# Patient Record
Sex: Female | Born: 1945
Health system: Southern US, Community
[De-identification: ages and names within clinical notes are randomized; demographics above are authoritative.]

## PROBLEM LIST (undated history)

## (undated) DIAGNOSIS — I82409 Acute embolism and thrombosis of unspecified deep veins of unspecified lower extremity: Secondary | ICD-10-CM

## (undated) DIAGNOSIS — R7302 Impaired glucose tolerance (oral): Secondary | ICD-10-CM

## (undated) DIAGNOSIS — M8580 Other specified disorders of bone density and structure, unspecified site: Secondary | ICD-10-CM

## (undated) DIAGNOSIS — K219 Gastro-esophageal reflux disease without esophagitis: Secondary | ICD-10-CM

## (undated) DIAGNOSIS — M797 Fibromyalgia: Secondary | ICD-10-CM

## (undated) DIAGNOSIS — M899 Disorder of bone, unspecified: Secondary | ICD-10-CM

## (undated) DIAGNOSIS — E785 Hyperlipidemia, unspecified: Secondary | ICD-10-CM

## (undated) DIAGNOSIS — C4492 Squamous cell carcinoma of skin, unspecified: Secondary | ICD-10-CM

## (undated) DIAGNOSIS — J449 Chronic obstructive pulmonary disease, unspecified: Secondary | ICD-10-CM

## (undated) DIAGNOSIS — R5382 Chronic fatigue, unspecified: Secondary | ICD-10-CM

## (undated) DIAGNOSIS — Z8619 Personal history of other infectious and parasitic diseases: Secondary | ICD-10-CM

## (undated) DIAGNOSIS — I10 Essential (primary) hypertension: Secondary | ICD-10-CM

## (undated) DIAGNOSIS — A310 Pulmonary mycobacterial infection: Secondary | ICD-10-CM

## (undated) DIAGNOSIS — C50919 Malignant neoplasm of unspecified site of unspecified female breast: Secondary | ICD-10-CM

## (undated) DIAGNOSIS — M949 Disorder of cartilage, unspecified: Secondary | ICD-10-CM

## (undated) HISTORY — DX: Fibromyalgia: M79.7

## (undated) HISTORY — DX: Essential (primary) hypertension: I10

## (undated) HISTORY — DX: Squamous cell carcinoma of skin, unspecified: C44.92

## (undated) HISTORY — DX: Gastro-esophageal reflux disease without esophagitis: K21.9

## (undated) HISTORY — DX: Pulmonary mycobacterial infection: A31.0

## (undated) HISTORY — DX: Chronic obstructive pulmonary disease, unspecified: J44.9

## (undated) HISTORY — DX: Hyperlipidemia, unspecified: E78.5

## (undated) HISTORY — DX: Impaired glucose tolerance (oral): R73.02

## (undated) HISTORY — DX: Chronic fatigue, unspecified: R53.82

## (undated) HISTORY — DX: Malignant neoplasm of unspecified site of unspecified female breast: C50.919

## (undated) HISTORY — DX: Acute embolism and thrombosis of unspecified deep veins of unspecified lower extremity: I82.409

## (undated) HISTORY — PX: FOOT SURGERY: SHX648

## (undated) HISTORY — DX: Disorder of cartilage, unspecified: M94.9

## (undated) HISTORY — PX: OTHER SURGICAL HISTORY: SHX169

## (undated) HISTORY — DX: Other specified disorders of bone density and structure, unspecified site: M85.80

## (undated) HISTORY — DX: Disorder of bone, unspecified: M89.9

## (undated) HISTORY — DX: Personal history of other infectious and parasitic diseases: Z86.19

---

## 2004-05-05 ENCOUNTER — Encounter: Payer: Self-pay | Admitting: Infectious Diseases

## 2006-06-05 ENCOUNTER — Ambulatory Visit: Payer: Self-pay | Admitting: Internal Medicine

## 2006-06-19 ENCOUNTER — Ambulatory Visit: Payer: Self-pay | Admitting: Pulmonary Disease

## 2006-06-20 ENCOUNTER — Ambulatory Visit: Payer: Self-pay | Admitting: Internal Medicine

## 2006-07-23 ENCOUNTER — Ambulatory Visit: Payer: Self-pay | Admitting: Pulmonary Disease

## 2006-08-29 ENCOUNTER — Ambulatory Visit: Payer: Self-pay | Admitting: Pulmonary Disease

## 2006-09-26 ENCOUNTER — Other Ambulatory Visit: Admission: RE | Admit: 2006-09-26 | Discharge: 2006-09-26 | Payer: Self-pay | Admitting: Obstetrics and Gynecology

## 2006-10-11 ENCOUNTER — Ambulatory Visit: Payer: Self-pay | Admitting: Pulmonary Disease

## 2006-10-11 LAB — CONVERTED CEMR LAB: Pap Smear: NORMAL

## 2006-10-17 ENCOUNTER — Ambulatory Visit: Payer: Self-pay | Admitting: Cardiology

## 2006-10-23 ENCOUNTER — Encounter: Payer: Self-pay | Admitting: Internal Medicine

## 2006-10-23 ENCOUNTER — Encounter: Admission: RE | Admit: 2006-10-23 | Discharge: 2006-10-23 | Payer: Self-pay | Admitting: Obstetrics and Gynecology

## 2006-12-06 ENCOUNTER — Ambulatory Visit: Payer: Self-pay | Admitting: Internal Medicine

## 2006-12-17 ENCOUNTER — Ambulatory Visit: Payer: Self-pay | Admitting: Pulmonary Disease

## 2006-12-24 ENCOUNTER — Ambulatory Visit: Payer: Self-pay | Admitting: Internal Medicine

## 2006-12-25 ENCOUNTER — Ambulatory Visit: Payer: Self-pay | Admitting: Gastroenterology

## 2007-01-01 ENCOUNTER — Encounter (INDEPENDENT_AMBULATORY_CARE_PROVIDER_SITE_OTHER): Payer: Self-pay | Admitting: Specialist

## 2007-01-01 ENCOUNTER — Ambulatory Visit: Payer: Self-pay | Admitting: Internal Medicine

## 2007-01-02 ENCOUNTER — Ambulatory Visit: Payer: Self-pay

## 2007-01-02 ENCOUNTER — Encounter: Payer: Self-pay | Admitting: Cardiovascular Disease

## 2007-01-15 ENCOUNTER — Ambulatory Visit: Payer: Self-pay | Admitting: Internal Medicine

## 2007-01-15 LAB — CONVERTED CEMR LAB
ALT: 32 units/L (ref 0–40)
Albumin: 3.6 g/dL (ref 3.5–5.2)
Alkaline Phosphatase: 32 units/L — ABNORMAL LOW (ref 39–117)
Cholesterol: 148 mg/dL (ref 0–200)
LDL Cholesterol: 80 mg/dL (ref 0–99)
Total CHOL/HDL Ratio: 2.6
Total Protein: 6.7 g/dL (ref 6.0–8.3)
VLDL: 10 mg/dL (ref 0–40)

## 2007-03-18 ENCOUNTER — Ambulatory Visit: Payer: Self-pay | Admitting: Emergency Medicine

## 2007-03-18 ENCOUNTER — Ambulatory Visit: Payer: Self-pay | Admitting: Pulmonary Disease

## 2007-06-05 ENCOUNTER — Ambulatory Visit: Payer: Self-pay | Admitting: Internal Medicine

## 2007-06-05 LAB — CONVERTED CEMR LAB
Rhuematoid fact SerPl-aCnc: <20 intl units/mL — ABNORMAL LOW (ref 0.0–20.0)
Sed Rate: 12 mm/hr (ref 0–25)
ds DNA Ab: <1 (ref <5)

## 2007-09-11 ENCOUNTER — Ambulatory Visit: Payer: Self-pay | Admitting: Pulmonary Disease

## 2007-10-16 ENCOUNTER — Ambulatory Visit: Payer: Self-pay | Admitting: Internal Medicine

## 2007-10-16 DIAGNOSIS — A318 Other mycobacterial infections: Secondary | ICD-10-CM | POA: Insufficient documentation

## 2007-10-16 DIAGNOSIS — IMO0001 Reserved for inherently not codable concepts without codable children: Secondary | ICD-10-CM | POA: Insufficient documentation

## 2007-10-16 DIAGNOSIS — M797 Fibromyalgia: Secondary | ICD-10-CM | POA: Insufficient documentation

## 2007-10-16 DIAGNOSIS — A310 Pulmonary mycobacterial infection: Secondary | ICD-10-CM | POA: Insufficient documentation

## 2007-10-16 DIAGNOSIS — I10 Essential (primary) hypertension: Secondary | ICD-10-CM | POA: Insufficient documentation

## 2007-10-16 DIAGNOSIS — G43009 Migraine without aura, not intractable, without status migrainosus: Secondary | ICD-10-CM | POA: Insufficient documentation

## 2007-10-16 DIAGNOSIS — E785 Hyperlipidemia, unspecified: Secondary | ICD-10-CM

## 2007-10-16 DIAGNOSIS — M810 Age-related osteoporosis without current pathological fracture: Secondary | ICD-10-CM

## 2007-10-16 DIAGNOSIS — K219 Gastro-esophageal reflux disease without esophagitis: Secondary | ICD-10-CM | POA: Insufficient documentation

## 2007-10-16 DIAGNOSIS — M899 Disorder of bone, unspecified: Secondary | ICD-10-CM

## 2007-10-16 DIAGNOSIS — Z87898 Personal history of other specified conditions: Secondary | ICD-10-CM | POA: Insufficient documentation

## 2007-10-16 HISTORY — DX: Disorder of bone, unspecified: M89.9

## 2007-10-17 LAB — CONVERTED CEMR LAB
AST: 29 units/L (ref 0–37)
Bilirubin, Direct: 0.1 mg/dL (ref 0.0–0.3)
Chloride: 103 meq/L (ref 96–112)
Cholesterol: 190 mg/dL (ref 0–200)
Creatinine, Ser: 0.8 mg/dL (ref 0.4–1.2)
Eosinophils Relative: 1.9 % (ref 0.0–5.0)
Glucose, Bld: 104 mg/dL — ABNORMAL HIGH (ref 70–99)
HCT: 41.1 % (ref 36.0–46.0)
HDL: 76.9 mg/dL (ref >39.0)
Hemoglobin: 14.2 g/dL (ref 12.0–15.0)
Ketones, ur: NEGATIVE mg/dL
LDL Cholesterol: 99 mg/dL (ref 0–99)
Leukocytes, UA: NEGATIVE
MCV: 96.2 fL (ref 78.0–100.0)
Monocytes Absolute: 0.8 10*3/uL — ABNORMAL HIGH (ref 0.2–0.7)
Neutrophils Relative %: 70.9 % (ref 43.0–77.0)
Nitrite: NEGATIVE
Potassium: 4.2 meq/L (ref 3.5–5.1)
RBC: 4.27 M/uL (ref 3.87–5.11)
RDW: 12.2 % (ref 11.5–14.6)
Sodium: 142 meq/L (ref 135–145)
TSH: 1.5 microintl units/mL (ref 0.35–5.50)
Total Bilirubin: 0.7 mg/dL (ref 0.3–1.2)
Total Protein: 7.3 g/dL (ref 6.0–8.3)
Urobilinogen, UA: 0.2 (ref 0.0–1.0)
WBC: 6.3 10*3/uL (ref 4.5–10.5)

## 2007-11-04 ENCOUNTER — Ambulatory Visit: Payer: Self-pay | Admitting: Infectious Diseases

## 2007-11-08 ENCOUNTER — Ambulatory Visit (HOSPITAL_COMMUNITY): Admission: RE | Admit: 2007-11-08 | Discharge: 2007-11-08 | Payer: Self-pay | Admitting: Family Medicine

## 2007-11-25 ENCOUNTER — Encounter: Admission: RE | Admit: 2007-11-25 | Discharge: 2007-11-25 | Payer: Self-pay | Admitting: Obstetrics and Gynecology

## 2007-11-27 ENCOUNTER — Ambulatory Visit: Payer: Self-pay | Admitting: Infectious Diseases

## 2007-12-04 ENCOUNTER — Encounter: Payer: Self-pay | Admitting: Infectious Diseases

## 2008-01-27 ENCOUNTER — Ambulatory Visit: Payer: Self-pay | Admitting: Infectious Diseases

## 2008-01-27 LAB — CONVERTED CEMR LAB
ALT: 26 units/L (ref 0–35)
Albumin: 4.7 g/dL (ref 3.5–5.2)
Basophils Absolute: 0 10*3/uL (ref 0.0–0.1)
CO2: 27 meq/L (ref 19–32)
Chloride: 103 meq/L (ref 96–112)
Eosinophils Relative: 1 % (ref 0–5)
Lymphocytes Relative: 23 % (ref 12–46)
Neutro Abs: 4 10*3/uL (ref 1.7–7.7)
Neutrophils Relative %: 65 % (ref 43–77)
Platelets: 211 10*3/uL (ref 150–400)
Potassium: 4 meq/L (ref 3.5–5.3)
RDW: 13.3 % (ref 11.5–15.5)
Sodium: 142 meq/L (ref 135–145)
Total Bilirubin: 0.4 mg/dL (ref 0.3–1.2)
Total Protein: 7.5 g/dL (ref 6.0–8.3)
WBC: 6.1 10*3/uL (ref 4.0–10.5)

## 2008-04-13 ENCOUNTER — Encounter: Payer: Self-pay | Admitting: Internal Medicine

## 2008-04-23 ENCOUNTER — Ambulatory Visit: Payer: Self-pay | Admitting: Internal Medicine

## 2008-04-23 DIAGNOSIS — M25519 Pain in unspecified shoulder: Secondary | ICD-10-CM | POA: Insufficient documentation

## 2008-06-29 ENCOUNTER — Ambulatory Visit: Payer: Self-pay | Admitting: Infectious Diseases

## 2008-06-29 LAB — CONVERTED CEMR LAB
AST: 20 units/L (ref 0–37)
Albumin: 4.6 g/dL (ref 3.5–5.2)
Alkaline Phosphatase: 37 units/L — ABNORMAL LOW (ref 39–117)
Basophils Relative: 0 % (ref 0–1)
Eosinophils Absolute: 0 10*3/uL (ref 0.0–0.7)
Lymphs Abs: 1.2 10*3/uL (ref 0.7–4.0)
MCV: 97.4 fL (ref 78.0–100.0)
Neutrophils Relative %: 72 % (ref 43–77)
Platelets: 223 10*3/uL (ref 150–400)
Potassium: 4.1 meq/L (ref 3.5–5.3)
Sodium: 140 meq/L (ref 135–145)
Total Protein: 7.3 g/dL (ref 6.0–8.3)
WBC: 6.9 10*3/uL (ref 4.0–10.5)

## 2008-07-02 ENCOUNTER — Ambulatory Visit (HOSPITAL_BASED_OUTPATIENT_CLINIC_OR_DEPARTMENT_OTHER): Admission: RE | Admit: 2008-07-02 | Discharge: 2008-07-02 | Payer: Self-pay | Admitting: Infectious Diseases

## 2008-09-24 ENCOUNTER — Ambulatory Visit: Payer: Self-pay | Admitting: Internal Medicine

## 2008-09-24 DIAGNOSIS — N959 Unspecified menopausal and perimenopausal disorder: Secondary | ICD-10-CM | POA: Insufficient documentation

## 2008-09-24 LAB — CONVERTED CEMR LAB
ALT: 27 units/L (ref 0–35)
Albumin: 4.2 g/dL (ref 3.5–5.2)
Alkaline Phosphatase: 38 units/L — ABNORMAL LOW (ref 39–117)
BUN: 13 mg/dL (ref 6–23)
CO2: 32 meq/L (ref 19–32)
Eosinophils Relative: 0.4 % (ref 0.0–5.0)
GFR calc Af Amer: 93 mL/min
Glucose, Bld: 102 mg/dL — ABNORMAL HIGH (ref 70–99)
HCT: 42.5 % (ref 36.0–46.0)
Hemoglobin: 14.2 g/dL (ref 12.0–15.0)
Monocytes Absolute: 0.7 10*3/uL (ref 0.1–1.0)
Monocytes Relative: 10.8 % (ref 3.0–12.0)
Neutro Abs: 4.4 10*3/uL (ref 1.4–7.7)
Nitrite: NEGATIVE
Platelets: 179 10*3/uL (ref 150–400)
Potassium: 4.1 meq/L (ref 3.5–5.1)
Total Protein, Urine: NEGATIVE mg/dL
Total Protein: 7.4 g/dL (ref 6.0–8.3)
WBC: 6.3 10*3/uL (ref 4.5–10.5)
pH: 7.5 (ref 5.0–8.0)

## 2008-10-26 ENCOUNTER — Ambulatory Visit: Payer: Self-pay | Admitting: Infectious Diseases

## 2008-11-26 ENCOUNTER — Ambulatory Visit: Payer: Self-pay | Admitting: Internal Medicine

## 2008-11-26 ENCOUNTER — Encounter: Payer: Self-pay | Admitting: Internal Medicine

## 2008-12-01 ENCOUNTER — Encounter: Admission: RE | Admit: 2008-12-01 | Discharge: 2008-12-01 | Payer: Self-pay | Admitting: Obstetrics and Gynecology

## 2009-01-13 ENCOUNTER — Encounter: Payer: Self-pay | Admitting: Infectious Diseases

## 2009-01-22 ENCOUNTER — Telehealth: Payer: Self-pay | Admitting: Internal Medicine

## 2009-04-16 ENCOUNTER — Telehealth (INDEPENDENT_AMBULATORY_CARE_PROVIDER_SITE_OTHER): Payer: Self-pay | Admitting: *Deleted

## 2009-04-16 ENCOUNTER — Telehealth: Payer: Self-pay | Admitting: Internal Medicine

## 2009-06-02 ENCOUNTER — Ambulatory Visit: Payer: Self-pay | Admitting: Internal Medicine

## 2009-06-02 DIAGNOSIS — K921 Melena: Secondary | ICD-10-CM

## 2009-06-02 DIAGNOSIS — G4700 Insomnia, unspecified: Secondary | ICD-10-CM | POA: Insufficient documentation

## 2009-06-28 ENCOUNTER — Encounter: Payer: Self-pay | Admitting: Internal Medicine

## 2009-08-26 ENCOUNTER — Encounter: Payer: Self-pay | Admitting: Internal Medicine

## 2009-10-13 ENCOUNTER — Ambulatory Visit: Payer: Self-pay | Admitting: Internal Medicine

## 2009-10-13 LAB — CONVERTED CEMR LAB
ALT: 23 units/L (ref 0–35)
Albumin: 4.1 g/dL (ref 3.5–5.2)
BUN: 13 mg/dL (ref 6–23)
Basophils Relative: 0.6 % (ref 0.0–3.0)
Bilirubin Urine: NEGATIVE
Calcium: 9.6 mg/dL (ref 8.4–10.5)
Creatinine, Ser: 0.8 mg/dL (ref 0.4–1.2)
Direct LDL: 106.4 mg/dL
Eosinophils Absolute: 0 10*3/uL (ref 0.0–0.7)
Eosinophils Relative: 0.9 % (ref 0.0–5.0)
GFR calc non Af Amer: 76.74 mL/min (ref >60)
Glucose, Bld: 95 mg/dL (ref 70–99)
HCT: 42.2 % (ref 36.0–46.0)
HDL: 86.3 mg/dL (ref >39.00)
Hemoglobin: 14.7 g/dL (ref 12.0–15.0)
MCHC: 34.9 g/dL (ref 30.0–36.0)
MCV: 100.1 fL — ABNORMAL HIGH (ref 78.0–100.0)
Monocytes Absolute: 0.6 10*3/uL (ref 0.1–1.0)
Neutro Abs: 3.6 10*3/uL (ref 1.4–7.7)
Nitrite: NEGATIVE
RBC: 4.21 M/uL (ref 3.87–5.11)
Sodium: 141 meq/L (ref 135–145)
Total Protein: 7.2 g/dL (ref 6.0–8.3)
Urine Glucose: NEGATIVE mg/dL
Urobilinogen, UA: 0.2 (ref 0.0–1.0)
WBC: 5.4 10*3/uL (ref 4.5–10.5)

## 2009-10-20 ENCOUNTER — Ambulatory Visit: Payer: Self-pay | Admitting: Internal Medicine

## 2009-11-09 LAB — CONVERTED CEMR LAB: Pap Smear: NORMAL

## 2009-12-02 ENCOUNTER — Encounter: Admission: RE | Admit: 2009-12-02 | Discharge: 2009-12-02 | Payer: Self-pay | Admitting: Obstetrics and Gynecology

## 2009-12-09 ENCOUNTER — Telehealth: Payer: Self-pay | Admitting: Internal Medicine

## 2010-10-19 ENCOUNTER — Ambulatory Visit: Payer: Self-pay | Admitting: Infectious Diseases

## 2010-10-19 LAB — CONVERTED CEMR LAB
BUN: 13 mg/dL (ref 6–23)
Chloride: 100 meq/L (ref 96–112)
Potassium: 4.3 meq/L (ref 3.5–5.3)
Sodium: 141 meq/L (ref 135–145)

## 2010-10-20 ENCOUNTER — Ambulatory Visit (HOSPITAL_BASED_OUTPATIENT_CLINIC_OR_DEPARTMENT_OTHER)
Admission: RE | Admit: 2010-10-20 | Discharge: 2010-10-20 | Payer: Self-pay | Source: Home / Self Care | Admitting: Infectious Diseases

## 2010-10-20 ENCOUNTER — Ambulatory Visit: Payer: Self-pay | Admitting: Diagnostic Radiology

## 2010-10-31 ENCOUNTER — Encounter: Payer: Self-pay | Admitting: Internal Medicine

## 2010-10-31 ENCOUNTER — Ambulatory Visit: Payer: Self-pay | Admitting: Internal Medicine

## 2010-10-31 DIAGNOSIS — R5383 Other fatigue: Secondary | ICD-10-CM

## 2010-10-31 DIAGNOSIS — J449 Chronic obstructive pulmonary disease, unspecified: Secondary | ICD-10-CM

## 2010-10-31 DIAGNOSIS — R3129 Other microscopic hematuria: Secondary | ICD-10-CM

## 2010-10-31 DIAGNOSIS — R5381 Other malaise: Secondary | ICD-10-CM

## 2010-11-01 LAB — CONVERTED CEMR LAB
Albumin: 4.3 g/dL (ref 3.5–5.2)
Alkaline Phosphatase: 44 units/L (ref 39–117)
Basophils Absolute: 0 10*3/uL (ref 0.0–0.1)
Basophils Relative: 0.4 % (ref 0.0–3.0)
CO2: 30 meq/L (ref 19–32)
Calcium: 9.4 mg/dL (ref 8.4–10.5)
Chloride: 101 meq/L (ref 96–112)
Cholesterol: 199 mg/dL (ref 0–200)
Eosinophils Absolute: 0 10*3/uL (ref 0.0–0.7)
Glucose, Bld: 103 mg/dL — ABNORMAL HIGH (ref 70–99)
HCT: 42 % (ref 36.0–46.0)
HDL: 91.5 mg/dL (ref >39.00)
Hemoglobin: 14.5 g/dL (ref 12.0–15.0)
Ketones, ur: 40 mg/dL
Leukocytes, UA: NEGATIVE
Lymphs Abs: 1.3 10*3/uL (ref 0.7–4.0)
MCHC: 34.6 g/dL (ref 30.0–36.0)
MCV: 97.4 fL (ref 78.0–100.0)
Monocytes Absolute: 0.7 10*3/uL (ref 0.1–1.0)
Neutro Abs: 5.9 10*3/uL (ref 1.4–7.7)
Nitrite: NEGATIVE
RBC: 4.31 M/uL (ref 3.87–5.11)
RDW: 13.1 % (ref 11.5–14.6)
Sodium: 141 meq/L (ref 135–145)
Specific Gravity, Urine: 1.025 (ref 1.000–1.030)
TSH: 1.69 microintl units/mL (ref 0.35–5.50)
Total CHOL/HDL Ratio: 2
Total Protein, Urine: NEGATIVE mg/dL
Total Protein: 7 g/dL (ref 6.0–8.3)
Triglycerides: 44 mg/dL (ref 0.0–149.0)
pH: 6.5 (ref 5.0–8.0)

## 2010-11-09 ENCOUNTER — Ambulatory Visit: Payer: Self-pay | Admitting: Infectious Diseases

## 2010-11-10 ENCOUNTER — Telehealth: Payer: Self-pay | Admitting: Internal Medicine

## 2010-11-24 ENCOUNTER — Ambulatory Visit: Payer: Self-pay | Admitting: Critical Care Medicine

## 2010-11-24 DIAGNOSIS — J471 Bronchiectasis with (acute) exacerbation: Secondary | ICD-10-CM

## 2010-12-06 ENCOUNTER — Ambulatory Visit (HOSPITAL_BASED_OUTPATIENT_CLINIC_OR_DEPARTMENT_OTHER)
Admission: RE | Admit: 2010-12-06 | Discharge: 2010-12-06 | Payer: Self-pay | Source: Home / Self Care | Attending: Obstetrics and Gynecology | Admitting: Obstetrics and Gynecology

## 2010-12-21 ENCOUNTER — Encounter: Payer: Self-pay | Admitting: Infectious Diseases

## 2010-12-21 ENCOUNTER — Ambulatory Visit
Admission: RE | Admit: 2010-12-21 | Discharge: 2010-12-21 | Payer: Self-pay | Source: Home / Self Care | Attending: Infectious Diseases | Admitting: Infectious Diseases

## 2010-12-21 LAB — CONVERTED CEMR LAB
ALT: 12 units/L (ref 0–35)
AST: 18 units/L (ref 0–37)
Alkaline Phosphatase: 42 units/L (ref 39–117)
BUN: 15 mg/dL (ref 6–23)
Creatinine, Ser: 0.82 mg/dL (ref 0.40–1.20)
HCT: 42.3 % (ref 36.0–46.0)
Hemoglobin: 14.7 g/dL (ref 12.0–15.0)
MCHC: 34.8 g/dL (ref 30.0–36.0)
Potassium: 4.3 meq/L (ref 3.5–5.3)
RDW: 13.4 % (ref 11.5–15.5)

## 2011-01-10 NOTE — Miscellaneous (Signed)
Summary: Orders Update   Clinical Lists Changes  Problems: Added new problem of MICROSCOPIC HEMATURIA (ICD-599.72) Orders: Added new Referral order of Urology Referral (Urology) - Signed

## 2011-01-10 NOTE — Assessment & Plan Note (Signed)
Summary: 2wks f/u [mkj]   CC:  f/u ov .  History of Present Illness: 65 yo F with hx cough. Was thought to have scar tissue on CXR  but then later felt to have bronchiectasis (abnormal CT scans as well).  Had bronchoscopy in feb 2005 showing AFB but cx did not grow. then had endoscopy in May 2005 which grew MAI (S- clarithro, eth, RIF 8.0, synergy positive for E/R) and fortuitum (S- cipro/smikacin/tigecycline). began antibiotics may 2005 and was treated with ETH/Azithro and cipro. she continued on anbiotics until March 2007. Did well until  July 2007 when she had a CT scan which showed some "activation" of MAI.  She was restarted on her MAI therapy in Nov 2008. Last CT scan was 7-09    " 1.  Stable mild lingular bronchiectasis.  Stable tiny associated left lung nodules, which are consistent with a postinflammatory etiology. 2.  No active disease." Last seen in ID November of 2009 and was taken off meds for MAI, was doing well. Seen November 2011 and felt like her "MAC is back". More coughing, no fevers, no chills, SOB has been at baseline. Cough is non-productive but she feels like there is sputum there. wt steady. Has some chest discomfort, tightness with breathing deep. she relates this to her fibromyalgia. lymphadenopathy She had f/u CT 10-20-10: 1.  New tree-in-bud opacities in the right middle lobe since the prior CT from July, 2009, consistent with MAI. 2.  Stable scarring, bronchiectasis, and tree-in-bud opacities in the inferior left upper lobe adjacent to the major fissure.  Stable scar and bronchiectasis medially in the right middle lobe.  No new pulmonary parenchymal abnormalities elsewhere. 3.  Stable hyperinflation consistent with COPD and/or asthma. 4.  No significant lymphadenopathy. Had f/u CBC (nl) and CMP (mild increase in Glc) as well.  Feels about the same today. cough was loose at one point, now tight again.   Preventive Screening-Counseling & Management  Alcohol-Tobacco  Alcohol drinks/day: occasional     Alcohol type: mixed drink     Smoking Status: quit     Year Quit: 20 yrs ago  Current Medications (verified): 1)  Nexium 40 Mg Cpdr (Esomeprazole Magnesium) .... Take 1 Capsule By Mouth Two Times A Day 2)  Simvastatin 40 Mg Tabs (Simvastatin) .... 1/2 By Mouth Once Daily 3)  Temazepam 15 Mg  Caps (Temazepam) .Marland Kitchen.. 1 - 2 By Mouth At Bedtime As Needed 4)  Carisoprodol 350 Mg  Tabs (Carisoprodol) .Marland Kitchen.. 1 By Mouth Two Times A Day As Needed 5)  Adult Aspirin Ec Low Strength 81 Mg  Tbec (Aspirin) .Marland Kitchen.. 1 By Mouth Qd 6)  Calcium 1500 Mg Tabs (Calcium Carbonate) .... Take 1 Tablet By Mouth Once A Day 7)  Omega-3 350 Mg Caps (Omega-3 Fatty Acids) .... 2 Tab By Mouth Once Daily (Pt Not Sure of Dose)  Allergies (verified): 1)  ! Biaxin 2)  ! Adhesive Bandages (Adhesive Bandages)    Updated Prior Medication List: NEXIUM 40 MG CPDR (ESOMEPRAZOLE MAGNESIUM) Take 1 capsule by mouth two times a day SIMVASTATIN 40 MG TABS (SIMVASTATIN) 1/2 by mouth once daily TEMAZEPAM 15 MG  CAPS (TEMAZEPAM) 1 - 2 by mouth at bedtime as needed CARISOPRODOL 350 MG  TABS (CARISOPRODOL) 1 by mouth two times a day as needed ADULT ASPIRIN EC LOW STRENGTH 81 MG  TBEC (ASPIRIN) 1 by mouth qd CALCIUM 1500 MG TABS (CALCIUM CARBONATE) Take 1 tablet by mouth once a day OMEGA-3 350 MG CAPS (OMEGA-3 FATTY  ACIDS) 2 tab by mouth once daily (pt not sure of dose)  Current Allergies (reviewed today): ! BIAXIN ! ADHESIVE BANDAGES (ADHESIVE BANDAGES) Vital Signs:  Patient profile:   65 year old female Height:      65.5 inches Weight:      123 pounds BMI:     20.23 BSA:     1.62 Temp:     99.3 degrees F oral BP sitting:   161 / 83  (left arm)  Vitals Entered By: Tomasita Morrow RN (November 09, 2010 10:02 AM) CC: f/u ov  Is Patient Diabetic? No Pain Assessment Patient in pain? yes     Location: chronic Intensity: 5 Type: aching Onset of pain  fibromyalgia  Nutritional Status Detail none   Have you ever been in a relationship where you felt threatened, hurt or afraid?No  Domestic Violence Intervention none  Does patient need assistance? Functional Status Self care Ambulation Normal   Physical Exam  General:  well-developed, well-nourished, well-hydrated, and underweight appearing.   Eyes:  pupils equal, pupils round, and pupils reactive to light.   Mouth:  pharynx pink and moist and no exudates.   Lungs:  normal respiratory effort and normal breath sounds.   Heart:  normal rate, regular rhythm, and no murmur.   Abdomen:  soft, non-tender, and normal bowel sounds.     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 she was on ETH/azithro. will add rifampin. she will call if she has difficulty taking these medications. will have her back to clinic 6-8 weeks.   Problem # 2:  COPD (ICD-496)  will have her seen by pulmonology. offered clu shot but states that she does not take it.  The following medications were removed from the medication list:    Spiriva Handihaler 18 Mcg Caps (Tiotropium bromide monohydrate) ..... Use asd 1 puff once daily  Orders: Est. Patient Level IV (61607) Pulmonary Referral (Pulmonary)  Medications Added to Medication List This Visit: 1)  Azithromycin 500 Mg Tabs (Azithromycin) .... Three times weekly 2)  Myambutol 400 Mg Tabs (Ethambutol hcl) .... 3 tab by mouth three times weekly 3)  Rifadin 300 Mg Caps (Rifampin) .... 2 tabs by mouth three times weekly Prescriptions: RIFADIN 300 MG CAPS (RIFAMPIN) 2 tabs by mouth three times weekly  #1 month x 3   Entered and Authorized by:   Johny Sax MD   Signed by:   Johny Sax MD on 11/09/2010   Method used:   Electronically to        [facility] (retail)       [address]       [place], [place]  [address]       Ph: [phone]        Fax: [phone]   RxID:   [id number] MYAMBUTOL 400 MG TABS (ETHAMBUTOL HCL) 3 tab by mouth three times weekly  #1 month x 3   Entered and Authorized by:   Johny Sax MD   Signed by:   Johny Sax MD on 11/09/2010   Method used:   Electronically to        [facility] (retail)       [address]       [place], [place]  [address]  Ph: [phone]       Fax: [phone]   RxID:   [id number] AZITHROMYCIN 500 MG TABS (AZITHROMYCIN) three times weekly  #30 x 3   Entered and Authorized by:   Johny Sax MD   Signed by:   Johny Sax MD on 11/09/2010   Method used:   Electronically to        [facility] (retail)       [address]       [place], [place]  [address]       Ph: [phone]       Fax: [phone]   RxID:   [id number]

## 2011-01-10 NOTE — Assessment & Plan Note (Signed)
Summary: YEARLY-STC   Vital Signs:  Patient profile:   65 year old female Height:      65.5 inches Weight:      123 pounds BMI:     20.23 O2 Sat:      98 % on Room air Temp:     98.9 degrees F oral Pulse rate:   78 / minute BP sitting:   132 / 72  (left arm) Cuff size:   regular  Vitals Entered By: Zella Ball Ewing CMA Duncan Dull) (October 31, 2010 10:33 AM)  O2 Flow:  Room air  Preventive Care Screening  Bone Density:    Date:  11/26/2008    Next Due:  12/2010    Results:  abnormal std dev  Pap Smear:    Date:  11/09/2009    Results:  normal   Mammogram:    Date:  11/09/2009    Results:  normal   Colonoscopy:    Date:  08/26/2009    Results:  normal      declines flu shot  CC: Yearly/RE   CC:  Yearly/RE.  History of Present Illness: here for f/u- overall doing well;  Pt denies CP, wheezing, orthopnea, pnd, worsening LE edema, palps, dizziness or syncope, but has had mild increased sob/doe  Pt denies new neuro symptoms such as headache, facial or extremity weakness  Pt denies polydipsia, polyuria  Overall good compliance with meds, trying to follow low chol  diet, wt stable, little excercise however .  Denies worsening depressive symptoms, suicidal ideation, or panic.  Overall good compliance with meds, and good tolerability.  No fever, wt loss, night sweats, loss of appetite or other constitutional symptoms  Pt states good ability with ADL's, low fall risk, home safety reviewed and adequate, no significant change in hearing or vision, trying to follow lower chol diet, and occasionally active only with regular excercise.   Preventive Screening-Counseling & Management      Drug Use:  no.    Problems Prior to Update: 1)  Microscopic Hematuria  (ICD-599.72) 2)  Fatigue  (ICD-780.79) 3)  COPD  (ICD-496) 4)  Insomnia-sleep Disorder-unspec  (ICD-780.52) 5)  Hematochezia  (ICD-578.1) 6)  Menopausal Disorder  (ICD-627.9) 7)  Preventive Health Care  (ICD-V70.0) 8)  Shoulder  Pain, Left  (ICD-719.41) 9)  Osteopenia  (ICD-733.90) 10)  Common Migraine  (ICD-346.10) 11)  Hypertension  (ICD-401.9) 12)  Fibromyalgia  (ICD-729.1) 13)  Gerd  (ICD-530.81) 14)  Preventive Health Care  (ICD-V70.0) 15)  Pulmonary Diseases Due To Other Mycobacteria  (ICD-031.0) 16)  Shingles, Hx of  (ICD-V13.8) 17)  Hyperlipidemia  (ICD-272.4) 18)  Bacteremia, Mycobacterium Avium Complex  (ICD-031.2) 19)  Family History of Alcoholism/addiction  (ICD-V61.41) 20)  Family History Breast Cancer 1st Degree Relative <50  (ICD-V16.3)  Medications Prior to Update: 1)  Nexium 40 Mg Cpdr (Esomeprazole Magnesium) .... Take 1 Capsule By Mouth Two Times A Day 2)  Simvastatin 40 Mg Tabs (Simvastatin) .... 1/2 By Mouth Once Daily 3)  Temazepam 15 Mg  Caps (Temazepam) .Marland Kitchen.. 1 - 2 By Mouth At Bedtime As Needed 4)  Carisoprodol 350 Mg  Tabs (Carisoprodol) .Marland Kitchen.. 1 By Mouth Two Times A Day As Needed 5)  Adult Aspirin Ec Low Strength 81 Mg  Tbec (Aspirin) .Marland Kitchen.. 1 By Mouth Qd 6)  Calcium 1500 Mg Tabs (Calcium Carbonate) .... Take 1 Tablet By Mouth Once A Day 7)  Omega-3 350 Mg Caps (Omega-3 Fatty Acids) .... 2 Tab By Mouth Once Daily (Pt Not  Sure of Dose)  Current Medications (verified): 1)  Nexium 40 Mg Cpdr (Esomeprazole Magnesium) .... Take 1 Capsule By Mouth Two Times A Day 2)  Simvastatin 40 Mg Tabs (Simvastatin) .... 1/2 By Mouth Once Daily 3)  Temazepam 15 Mg  Caps (Temazepam) .Marland Kitchen.. 1 - 2 By Mouth At Bedtime As Needed 4)  Carisoprodol 350 Mg  Tabs (Carisoprodol) .Marland Kitchen.. 1 By Mouth Two Times A Day As Needed 5)  Adult Aspirin Ec Low Strength 81 Mg  Tbec (Aspirin) .Marland Kitchen.. 1 By Mouth Qd 6)  Calcium 1500 Mg Tabs (Calcium Carbonate) .... Take 1 Tablet By Mouth Once A Day 7)  Omega-3 350 Mg Caps (Omega-3 Fatty Acids) .... 2 Tab By Mouth Once Daily (Pt Not Sure of Dose) 8)  Spiriva Handihaler 18 Mcg Caps (Tiotropium Bromide Monohydrate) .... Use Asd 1 Puff Once Daily  Allergies (verified): 1)  ! Biaxin 2)  !  Adhesive Bandages (Adhesive Bandages)  Past History:  Past Surgical History: Last updated: 10/16/2007 shoulder impingement foot surgury  Family History: Last updated: 10/20/2009 Family History Breast cancer 1st degree relative <50 Family History Lung cancer - father Family History High cholesterol Family History Hypertension Grandparents with heart dz Family History of Alcoholism/Addiction Father with colon polyps  Social History: Last updated: 10/31/2010 Former Smoker- quit 20+ yrs ago.  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   Head:  normocephalic and atraumatic.   Eyes:  vision grossly intact, pupils equal, and pupils round.   Ears:  R ear normal and L ear normal.   Nose:  no external deformity and no nasal discharge.   Mouth:  no gingival abnormalities and pharynx pink and moist.   Neck:  supple and no masses.   Lungs:  normal respiratory effort and normal breath sounds.   Heart:  normal rate and regular rhythm.   Abdomen:  soft, non-tender, and normal bowel sounds.   Msk:  no joint tenderness and no joint swelling.  , does have some chronic lower lumbar paravertebral tender Extremities:  no edema, no erythema    Neurologic:  cranial nerves II-XII intact and strength normal in all extremities.   Skin:  color normal and no rashes.   Psych:  moderately anxious.     Impression & Recommendations:  Problem # 1:  FATIGUE (ICD-780.79) exam benign, to check labs below; follow with expectant management   Orders: TLB-BMP (Basic Metabolic Panel-BMET) (80048-METABOL) TLB-CBC Platelet - w/Differential (85025-CBCD) TLB-Hepatic/Liver Function Pnl (80076-HEPATIC) TLB-TSH (Thyroid Stimulating Hormone) (84443-TSH)  Problem # 2:  COPD (ICD-496)  Her updated medication list for this problem includes:    Spiriva Handihaler 18 Mcg Caps (Tiotropium bromide monohydrate) ..... Use asd 1 puff once daily to add the spiriva for trial - gave sample and rx  Problem # 3:  HYPERTENSION (ICD-401.9)  Orders: TLB-Udip ONLY (81003-UDIP)  BP today: 132/72 Prior BP: 158/79 (10/19/2010)  Labs Reviewed: K+: 4.3 (10/19/2010) Creat: : 0.75 (10/19/2010)   Chol: 206 (10/13/2009)   HDL: 86.30 (10/13/2009)   LDL: DEL (09/24/2008)   TG: 55.0 (10/13/2009) stable overall by hx and exam, ok to continue meds/tx as is   Problem # 4:  HYPERLIPIDEMIA (ICD-272.4)  Her updated medication list for this problem includes:    Simvastatin 40 Mg Tabs (Simvastatin) .Marland Kitchen... 1/2 by mouth once daily  Orders: TLB-Lipid Panel (80061-LIPID)  Labs Reviewed: SGOT: 27 (10/13/2009)   SGPT: 23 (10/13/2009)   HDL:86.30 (10/13/2009), 88.7 (09/24/2008)  LDL:DEL (09/24/2008), 99 (16/09/9603)  Chol:206 (10/13/2009), 208 (09/24/2008)  Trig:55.0 (10/13/2009), 56 (09/24/2008) stable overall by hx and exam, ok to continue meds/tx as is   Complete Medication List: 1)  Nexium 40 Mg Cpdr (Esomeprazole magnesium) .... Take 1 capsule by mouth two times a day 2)  Simvastatin 40 Mg Tabs (Simvastatin) .... 1/2 by mouth once daily 3)  Temazepam 15 Mg Caps (Temazepam) .Marland Kitchen.. 1 - 2 by mouth at bedtime as needed 4)  Carisoprodol 350 Mg Tabs (Carisoprodol) .Marland Kitchen.. 1 by  mouth two times a day as needed 5)  Adult Aspirin Ec Low Strength 81 Mg Tbec (Aspirin) .Marland Kitchen.. 1 by mouth qd 6)  Calcium 1500 Mg Tabs (Calcium carbonate) .... Take 1 tablet by mouth once a day 7)  Omega-3 350 Mg Caps (Omega-3 fatty acids) .... 2 tab by mouth once daily (pt not sure of dose) 8)  Spiriva Handihaler 18 Mcg Caps (Tiotropium bromide monohydrate) .... Use asd 1 puff once daily  Other Orders: EKG w/ Interpretation (93000) T-Bone Densitometry (54098)  Patient Instructions: 1)  please schedule the bone density for Feb 2012 before leaving today 2)  Your EKG was good today 3)  Please go to the Lab in the basement for your blood and/or urine tests today 4)  Please call the number on the [facility] Card for results of your testing  5)  You are given the medication refills today 6)  Please take all new medications as prescribed  7)  Continue all previous medications as before this visit  8)  Please schedule a follow-up appointment in 1 year, or sooner if needed Prescriptions: SPIRIVA HANDIHALER 18 MCG CAPS (TIOTROPIUM BROMIDE MONOHYDRATE) use asd 1 puff once daily  #90 x 3   Entered and Authorized by:   Corwin Levins MD   Signed by:   Corwin Levins MD on 10/31/2010   Method used:   Print then Give to Patient   RxID:   [id number] TEMAZEPAM 15 MG  CAPS (TEMAZEPAM) 1 - 2 by mouth at bedtime as needed  #60 x 5   Entered and Authorized by:   Corwin Levins MD   Signed by:   Corwin Levins MD on 10/31/2010   Method used:   Print then Give to Patient   RxID:   [id number] CARISOPRODOL 350 MG  TABS (CARISOPRODOL) 1 by mouth two times a day as needed  #60 x 5   Entered and Authorized by:   Corwin Levins MD   Signed by:   Corwin Levins MD on 10/31/2010   Method used:   Print then Give to Patient   RxID:   [id number] SIMVASTATIN 40 MG TABS (SIMVASTATIN) 1/2 by mouth once daily  #90 x 3   Entered and Authorized by:   Corwin Levins MD  Signed by:   Corwin Levins MD on 10/31/2010   Method  used:   Print then Give to Patient   RxID:   [id number] NEXIUM 40 MG CPDR (ESOMEPRAZOLE MAGNESIUM) Take 1 capsule by mouth two times a day  #180 x 3   Entered and Authorized by:   Corwin Levins MD   Signed by:   Corwin Levins MD on 10/31/2010   Method used:   Print then Give to Patient   RxID:   [id number]    Orders Added: 1)  EKG w/ Interpretation [93000] 2)  T-Bone Densitometry [77080] 3)  TLB-BMP (Basic Metabolic Panel-BMET) [80048-METABOL] 4)  TLB-CBC Platelet - w/Differential [85025-CBCD] 5)  TLB-Hepatic/Liver Function Pnl [80076-HEPATIC] 6)  TLB-TSH (Thyroid Stimulating Hormone) [84443-TSH] 7)  TLB-Lipid Panel [80061-LIPID] 8)  TLB-Udip ONLY [81003-UDIP] 9)  Est. Patient Level IV [84696]

## 2011-01-10 NOTE — Assessment & Plan Note (Signed)
Summary: CHECK UP [MKJ]   CC:  pt. c/o flareup of MAC.  History of Present Illness: 65 yo F with hx cough. Was thought to have scar tissue on CXR  but then later felt to have bronchiectasis (abnormal CT scans as well).  Had bronchoscopy in feb 2005 showing AFB but cx did not grow. then had endoscopy in May 2005 which grew MAI (S- clarithro, eth, RIF 8.0, synergy positive for E/R) and fortuitum (S- cipro/smikacin/tigecycline). began antibiotics may 2005 and was treated with ETH/Azithro and cipro. she continued on anbiotics until March 2007. Did well until  July 2007 when she had a CT scan which showed some "activation" of MAI.  She was restarted on her MAI therapy in Nov 2008. Last CT scan was 7-09    " 1.  Stable mild lingular bronchiectasis.  Stable tiny associated left lung nodules, which are consistent with a postinflammatory etiology. 2.  No active disease." Last seen in ID November of 2009 and was taken off meds for MAI, was doing well. Today feels like her "MAC is back". More coughing, no fevers, no chills, SOB has been at baseline. Cough is non-productive but she feels like there is sputum there. wt steady. Has some chest discomfort, tightness with breathing deep. she relates this to her fibromyalgia. lymphadenopathy    Preventive Screening-Counseling & Management  Alcohol-Tobacco     Alcohol drinks/day: occasional     Alcohol type: mixed drink     Smoking Status: quit     Year Quit: 20 yrs ago  Caffeine-Diet-Exercise     Caffeine use/day: coffee 2 per day     Does Patient Exercise: yes     Type of exercise: treadmill, stretching     Exercise (avg: min/session): <30     Times/week: 4  Safety-Violence-Falls     Seat Belt Use: yes   Updated Prior Medication List: NEXIUM 40 MG CPDR (ESOMEPRAZOLE MAGNESIUM) Take 1 capsule by mouth two times a day SIMVASTATIN 40 MG TABS (SIMVASTATIN) 1/2 by mouth once daily TEMAZEPAM 15 MG  CAPS (TEMAZEPAM) 1 - 2 by mouth at bedtime as  needed CARISOPRODOL 350 MG  TABS (CARISOPRODOL) 1 by mouth two times a day as needed ADULT ASPIRIN EC LOW STRENGTH 81 MG  TBEC (ASPIRIN) 1 by mouth qd CALCIUM 1500 MG TABS (CALCIUM CARBONATE) Take 1 tablet by mouth once a day OMEGA-3 350 MG CAPS (OMEGA-3 FATTY ACIDS) 2 tba by mouth once daily (pt not sure of dose)  Current Allergies (reviewed today): ! BIAXIN ! ADHESIVE BANDAGES (ADHESIVE BANDAGES) Vital Signs:  Patient profile:   65 year old female Height:      66 inches (167.64 cm) Weight:      121.8 pounds (55.36 kg) BMI:     19.73 Temp:     98.6 degrees F (37.00 degrees C) oral Pulse rate:   85 / minute BP sitting:   158 / 79  (left arm)  Vitals Entered By: Wendall Mola CMA Duncan Dull) (October 19, 2010 10:22 AM) CC: pt. c/o flareup of MAC Is Patient Diabetic? No Pain Assessment Patient in pain? no      Nutritional Status BMI of 19 -24 = normal Nutritional Status Detail appetite "normal"  Have you ever been in a relationship where you felt threatened, hurt or afraid?No   Does patient need assistance? Functional Status Self care Ambulation Normal Comments no missed doses of meds per pt.   Physical Exam  General:  well-developed, well-nourished, well-hydrated, and underweight appearing.  Eyes:  pupils equal, pupils round, and pupils reactive to light.   Mouth:  pharynx pink and moist and no exudates.   Neck:  no masses.   Lungs:  normal respiratory effort.  good air movement, mild tubular sounds. Heart:  normal rate, regular rhythm, and no murmur.   Abdomen:  soft and non-tender.   Extremities:  no edema   Impression & Recommendations:  Problem # 1:  PULMONARY DISEASES DUE TO OTHER MYCOBACTERIA (ICD-031.0)  she appears more chronically ill than acutely ill today. we discussed restarting her therapy but will recheck her CT of the chest first. return to clinic 2 weeks post CT scan.   Orders: Est. Patient Level III (56213) CT with Contrast (CT w/  contrast) T-Basic Metabolic Panel (08657-84696)  Medications Added to Medication List This Visit: 1)  Calcium 1500 Mg Tabs (Calcium carbonate) .... Take 1 tablet by mouth once a day 2)  Omega-3 350 Mg Caps (Omega-3 fatty acids) .... 2 tab by mouth once daily (pt not sure of dose)   Not Administered:    Influenza Vaccine not given due to: declined

## 2011-01-10 NOTE — Progress Notes (Signed)
Summary: Prescription   Phone Note From Pharmacy   [facility]: [facility]  Summary of Call: Pharmacy sent back prescription for Temazepam and Carisoprodol, both printed and given to patient at [facility] 10/31/2010. Both prescriptions were not signed. Also, just spoke to the patient on the phone and SImvastatin also was not signed. Her pharmacy is [facility]. Initial call taken by: Robin Ewing CMA Duncan Dull),  November 10, 2010 9:24 AM  Follow-up for Phone Call        sorry, this was an error - will re-do rx  done hardcopy to [facility] side B - dahlia  Follow-up by: Corwin Levins MD,  November 10, 2010 11:55 AM  Additional Follow-up for Phone Call Additional follow up Details #1::        Rxs faxed to pharmacy Additional Follow-up by: Margaret Pyle, CMA,  November 10, 2010 12:52 PM    Prescriptions: SIMVASTATIN 40 MG TABS (SIMVASTATIN) 1/2 by mouth once daily  #45 x 3   Entered and Authorized by:   Corwin Levins MD   Signed by:   Corwin Levins MD on 11/10/2010   Method used:   Print then Give to Patient   RxID:   [id number] CARISOPRODOL 350 MG  TABS (CARISOPRODOL) 1 by mouth two times a day as needed  #60 x 5   Entered and Authorized by:   Corwin Levins MD   Signed by:   Corwin Levins MD on 11/10/2010   Method used:   Print then Give to Patient   RxID:   [id number] TEMAZEPAM 15 MG  CAPS (TEMAZEPAM) 1 - 2 by mouth at bedtime as needed  #60 x 5   Entered and Authorized by:   Corwin Levins MD   Signed by:   Corwin Levins MD on 11/10/2010   Method used:   Print then Give to Patient   RxID:   [id number]

## 2011-01-12 NOTE — Assessment & Plan Note (Addendum)
Summary: Pulmonary Consultation   Copy to:  Dr. Johny Sax Primary Provider/Referring Provider:  Dr. Oliver Barre  CC:  Pulmonary Consult - MAC and COPD.Marland Kitchen  History of Present Illness: Pulmonary Consultation  Hx per ID as follows on 11/11: 65 yo F with hx cough. Was thought to have scar tissue on CXR  but then later felt to have bronchiectasis (abnormal CT scans as well).  Had bronchoscopy in feb 2005 showing AFB but cx did not grow. then had endoscopy in May 2005 which grew MAI (S- clarithro, eth, RIF 8.0, synergy positive for E/R) and fortuitum (S- cipro/smikacin/tigecycline). began antibiotics may 2005 and was treated with ETH/Azithro and cipro. she continued on anbiotics until March 2007. Did well until  July 2007 when she had a CT scan which showed some "activation" of MAI.  She was restarted on her MAI therapy in Nov 2008. Last CT scan was 7-09    " 1.  Stable mild lingular bronchiectasis.  Stable tiny associated left lung nodules, which are consistent with a postinflammatory etiology. 2.  No active disease." Last seen in ID November of 2009 and was taken off meds for MAI, was doing well. Today feels like her "MAC is back". More coughing, no fevers, no chills, SOB has been at baseline. Cough is non-productive but she feels like there is sputum there. wt steady. Has some chest discomfort, tightness with breathing deep. she relates this to her fibromyalgia. lymphadenopathy  November 24, 2010 9:55 AM This pt went without pulm ov from 11/09 until 11/11.  This pts MAI hx is as above.   Pt has been off mac rx 11/09 .after 1.5 yrs seemed to get worse again with more cough and more symptoms but due to insurance issues did not see ID again until 11/11.  No pulm ovs since 11/08.,   11/11 CT chest more inflammation RML.  copd changes. ? never dx this before.    ID did restart Rx   THis pt is now on RIF/ETH/Zmax  thrice weekly.    This pt has  never been on RIF before.  This pt has been on  Cipro/ETH/Azithro before.   This pt noted cipro caused tendinitis and no prior issues with RIF.  THe  ETH no issues.     Current symptoms:  diff time coughing. mucus is hard to raise, now is tight in the chest  ? to break up secretions.  had pfts before in 08 notes dyspnea with exertion. has chills but no fever.  no cp, just tightness.  has fibromyalgia no mucus to get up.  stays hoarse  hx of Vocal cord dysfunction.  does have heartburn and acid issues.  is on the nexium, this helps but will breakthrough.   Preventive Screening-Counseling & Management  Alcohol-Tobacco     Smoking Status: quit > 6 months     Packs/Day: 0.75     Year Started: 1969     Year Quit: 1989     Pack years: 10  Current Medications (verified): 1)  Nexium 40 Mg Cpdr (Esomeprazole Magnesium) .... Take 1 Capsule By Mouth Once A Day 2)  Simvastatin 40 Mg Tabs (Simvastatin) .... 1/2 By Mouth Once Daily 3)  Temazepam 15 Mg  Caps (Temazepam) .Marland Kitchen.. 1 - 2 By Mouth At Bedtime As Needed 4)  Carisoprodol 350 Mg  Tabs (Carisoprodol) .Marland Kitchen.. 1 By Mouth Two Times A Day As Needed 5)  Adult Aspirin Ec Low Strength 81 Mg  Tbec (Aspirin) .Marland Kitchen.. 1 By Mouth  Qd 6)  Calcium 1500 Mg Tabs (Calcium Carbonate) .... Take 1 Tablet By Mouth Once A Day 7)  Omega-3 350 Mg Caps (Omega-3 Fatty Acids) .... 2 Tab By Mouth Once Daily (Pt Not Sure of Dose) 8)  Azithromycin 500 Mg Tabs (Azithromycin) .... Three Times Weekly 9)  Myambutol 400 Mg Tabs (Ethambutol Hcl) .... 3 Tab By Mouth Three Times Weekly 10)  Rifadin 300 Mg Caps (Rifampin) .... 2 Tabs By Mouth Three Times Weekly  Allergies (verified): 1)  ! Biaxin 2)  ! Adhesive Bandages (Adhesive Bandages) 3)  ! * Prednisone/steroids 4)  ! Cipro  Past History:  Past medical, surgical, family and social histories (including risk factors) reviewed, and no changes noted (except as noted below).  Past Medical History: Reviewed history from 10/31/2010 and no changes required. pulmonary  MAC/bronchiectasis COPD Hyperlipidemia hx of shingles GERD Fibromyalgia HTN MIGRAINE Osteopenia h/o mult skin ca h/o LLE DVT - remote  Past Surgical History: Reviewed history from 10/16/2007 and no changes required. shoulder impingement foot surgury  Family History: Reviewed history from 10/20/2009 and no changes required. Family History Breast cancer 1st degree relative <50 Family History Lung cancer - father Family History High cholesterol Family History Hypertension Grandparents with heart dz Family History of Alcoholism/Addiction Father with colon polyps  Social History: Reviewed history from 10/31/2010 and no changes required. Former Smoker- quit in 1989. Started at age 27.  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  The patient denies shortness of breath at rest, productive cough, coughing up blood, indigestion, loss of appetite, weight change, abdominal pain, difficulty swallowing, tooth/dental problems, nasal congestion/difficulty breathing through nose, sneezing, itching, ear ache, anxiety, depression, hand/feet swelling, joint stiffness or pain, rash, change in color of mucus, and fever.    Vital Signs:  Patient profile:   65 year old female Height:      65.5 inches Weight:      122.31 pounds BMI:     20.12 O2 Sat:      99 % on Room air Temp:     98.2 degrees F oral Pulse rate:   82 / minute BP sitting:   140 / 90  (left arm) Cuff size:   regular  Vitals Entered By: Gweneth Dimitri RN (November 24, 2010 9:43 AM)  O2 Flow:  Room air CC: Pulmonary Consult - MAC, COPD. Comments Medications reviewed with  patient Daytime contact number verified with patient. Gweneth Dimitri RN  November 24, 2010 9:44 AM    Physical Exam  Additional Exam:  Gen: Pleasant, well-nourished, in no distress,  normal affect ENT: No lesions,  mouth clear,  oropharynx clear, no postnasal drip Neck: No JVD, no TMG, no carotid bruits Lungs: No use of accessory muscles, no dullness to percussion, distant bs. no rhonchi Cardiovascular: RRR, heart sounds normal, no murmur or gallops, no peripheral edema Abdomen: soft and NT, no HSM,  BS normal Musculoskeletal: No deformities, no cyanosis or clubbing Neuro: alert, non focal Skin: Warm, no lesions or rashes    CT of Chest  Procedure date:  10/20/2010  Findings:      Findings: Since the prior examinations, new nodular  and tree-in-bud opacities in the right middle lobe.  Stable bronchiectasis and tree- in-bud opacities in the posterior inferior left upper lobe, adjacent to the fissure.  Stable scar and bronchiectasis medially in the right middle lobe.  Stable hyperinflation and biapical pleuroparenchymal scarring.  No confluent airspace consolidation. No pleural effusions.   Normal sized mediastinal lymph nodes, unchanged; no significant lymphadenopathy.  Normal heart size.  No visible coronary artery calcification.  Mild atherosclerosis involving the thoracic and upper abdominal aorta.  Visualized thyroid gland unremarkable.   Stable approximate 1.8 cm simple cyst in the anterior segment right lobe of liver.  Visualized upper abdomen otherwise unremarkable. Bone window images again demonstrate mild osteopenia and mild thoracic spondylosis.   IMPRESSION:   1.  New tree-in-bud opacities in the right middle lobe since the prior CT from July, 2009, consistent with MAI. 2.  Stable scarring, bronchiectasis, and tree-in-bud opacities in the inferior left upper lobe adjacent to the major fissure.  Stable scar and bronchiectasis medially in the right middle lobe.   No new pulmonary parenchymal abnormalities elsewhere. 3.  Stable hyperinflation consistent with COPD and/or asthma. 4.  No significant lymphadenopathy.    Pulmonary Function Test Date: 11/24/2010 Gender: Female  Pre-Spirometry FVC    Value: 2.99 L/min   Pred: 3.06 L/min     % Pred: 97 % FEV1    Value: 2.26 L     Pred: 2.23 L     % Pred: 101 % FEV1/FVC  Value: 76 %     Pred: 72 %    FEF 25-75  Value: 1.87 L/min   Pred: 2.50 L/min     % Pred: 74 %  Comments: mild peripheral airflow obstruction  Impression & Recommendations:  Problem # 1:  BRONCHIECTASIS WITH ACUTE EXACERBATION (ICD-494.1) Assessment Deteriorated Ongoing bronchiectasis with MAC and mucus plugging . Assoc obstructive lung disease on pfts and gerd ppt factors plan Start Spiriva daily Use Nexium 1/2 hour before meals daily and then eat Reflux diet Stay on ID medications Return 2 months [place]  Medications Added to Medication List This Visit: 1)  Nexium 40 Mg Cpdr (Esomeprazole magnesium) .... Take 1 capsule by mouth once a day 2)  Nexium 40 Mg Cpdr (Esomeprazole magnesium) .... Take 1 capsule by mouth once a day use 1/2 hour before meals and then eat 3)  Adult Aspirin Ec Low Strength 81 Mg Tbec (Aspirin) .Marland Kitchen.. 1 by mouth daily 4)  Spiriva Handihaler 18 Mcg Caps (Tiotropium bromide monohydrate) .... Two puffs in handihaler daily  Complete Medication List: 1)  Nexium 40 Mg Cpdr (Esomeprazole magnesium) .... Take 1 capsule by mouth once a day use 1/2 hour before meals and then eat 2)  Simvastatin 40 Mg Tabs (Simvastatin) .... 1/2 by mouth once daily 3)  Temazepam 15 Mg Caps (Temazepam) .Marland Kitchen.. 1 - 2 by mouth at bedtime as needed 4)  Carisoprodol 350 Mg Tabs (Carisoprodol) .Marland Kitchen.. 1 by mouth two times a day as needed 5)  Adult Aspirin Ec Low Strength 81 Mg Tbec (Aspirin) .Marland Kitchen.. 1 by mouth daily 6)  Calcium 1500 Mg Tabs (Calcium carbonate) .... Take 1 tablet by mouth once a day 7)  Omega-3 350 Mg Caps (Omega-3 fatty acids)  .... 2 tab by mouth once daily (pt not sure of dose) 8)  Azithromycin 500 Mg Tabs (Azithromycin) .... Three times weekly 9)  Myambutol 400 Mg Tabs (Ethambutol hcl) .... 3 tab by mouth three times weekly 10)  Rifadin 300 Mg Caps (Rifampin) .... 2  tabs by mouth three times weekly 11)  Spiriva Handihaler 18 Mcg Caps (Tiotropium bromide monohydrate) .... Two puffs in handihaler daily  Other Orders: New Patient Level V (29562) Spirometry w/Graph (94010)  Patient Instructions: 1)  Start Spiriva daily 2)  Use Nexium 1/2 hour before meals daily and then eat 3)  Reflux diet 4)  Stay on ID medications 5)  Return 2 months [place] Prescriptions: SPIRIVA HANDIHALER 18 MCG  CAPS (TIOTROPIUM BROMIDE MONOHYDRATE) Two puffs in handihaler daily  #30 x 6   Entered and Authorized by:   Storm Frisk MD   Signed by:   Storm Frisk MD on 11/24/2010   Method used:   Electronically to        [facility]       [address]       [place], [place]  [address]       Ph: [phone]       Fax: [phone]   RxID:   [id number]

## 2011-01-12 NOTE — Assessment & Plan Note (Signed)
Summary: 6wk f/u [mkj]   Referring Provider:  Dr. Johny Sax Primary Provider:  Dr. Oliver Barre  CC:  follow-up visit, nausea, chills, and achy since starting the anitbiotics.  History of Present Illness: 65 yo F with hx cough. Had bronchoscopy in feb 2005 showing AFB but cx did not grow. then had endoscopy in May 2005 which grew MAI (S- clarithro, eth, RIF 8.0, synergy positive for E/R) and fortuitum (S- cipro/smikacin/tigecycline).She began antibiotics May 2005 and was treated with ETH/Azithro and cipro until March 2007. Did well until  July 2007 when she had a CT scan which showed some "activation" of MAI.  She was restarted on her MAI therapy in Nov 2008. CT scan was 7-09    " 1.  Stable mild lingular bronchiectasis.  Stable tiny associated left lung nodules, which are consistent with a postinflammatory etiology. 2.  No active disease." Seen in ID November of 2009, was doing well and was taken off meds for MAI.  Seen November 2011 and felt like her "MAC is back". More coughing, no fevers, no chills, SOB has been at baseline. She had f/u CT 10-20-10: 1.  New tree-in-bud opacities in the right middle lobe since the prior CT from July, 2009, consistent with MAI. 2.  Stable scarring, bronchiectasis, and tree-in-bud opacities in the inferior left upper lobe adjacent to the major fissure.  Stable scar and bronchiectasis medially in the right middle lobe.  No new pulmonary parenchymal abnormalities elsewhere. 3.  Stable hyperinflation consistent with COPD and/or asthma. 4.  No significant lymphadenopathy.  She was restarted on MAI rx- Azithro/ETH/RIf. Since she has been on them she has had chills, body aches (which she attributes to RIf) and then also got nausea and fatigue. Was started on spiriva and PPI by Dr Delford Field and she feels that the tightness in her chest is better, cough no different.   Preventive Screening-Counseling & Management  Alcohol-Tobacco     Alcohol drinks/day: occasional  Alcohol type: mixed drink     Smoking Status: quit > 6 months     Packs/Day: 0.75     Year Started: 1969     Year Quit: 1989     Pack years: 10  Caffeine-Diet-Exercise     Caffeine use/day: coffee 2 per day     Does Patient Exercise: yes     Type of exercise: treadmill, stretching     Exercise (avg: min/session): <30     Times/week: 4  Safety-Violence-Falls     Seat Belt Use: yes   Updated Prior Medication List: NEXIUM 40 MG CPDR (ESOMEPRAZOLE MAGNESIUM) Take 1 capsule by mouth once a day Use 1/2 hour before meals and then eat SIMVASTATIN 40 MG TABS (SIMVASTATIN) 1/2 by mouth once daily TEMAZEPAM 15 MG  CAPS (TEMAZEPAM) 1 - 2 by mouth at bedtime as needed CARISOPRODOL 350 MG  TABS (CARISOPRODOL) 1 by mouth two times a day as needed ADULT ASPIRIN EC LOW STRENGTH 81 MG  TBEC (ASPIRIN) 1 by mouth daily CALCIUM 1500 MG TABS (CALCIUM CARBONATE) Take 1 tablet by mouth once a day OMEGA-3 350 MG CAPS (OMEGA-3 FATTY ACIDS) 2 tab by mouth once daily (pt not sure of dose) AZITHROMYCIN 500 MG TABS (AZITHROMYCIN) three times weekly MYAMBUTOL 400 MG TABS (ETHAMBUTOL HCL) 3 tab by mouth three times weekly RIFADIN 300 MG CAPS (RIFAMPIN) 2 tabs by mouth three times weekly SPIRIVA HANDIHALER 18 MCG  CAPS (TIOTROPIUM BROMIDE MONOHYDRATE) Two puffs in handihaler daily  Current Allergies (reviewed today): ! BIAXIN !  ADHESIVE BANDAGES (ADHESIVE BANDAGES) ! * PREDNISONE/STEROIDS ! CIPRO Past History:  Past medical, surgical, family and social histories (including risk factors) reviewed, and no changes noted (except as noted below).  Past Medical History: Reviewed history from 10/31/2010 and no changes required. pulmonary MAC/bronchiectasis COPD Hyperlipidemia hx of shingles GERD Fibromyalgia HTN MIGRAINE Osteopenia h/o mult skin ca h/o LLE DVT - remote  Past Surgical History: Reviewed history from 10/16/2007 and no changes required. shoulder impingement foot surgury  Family  History: Reviewed history from 11/24/2010 and no changes required. Family History Breast cancer 1st degree relative <50 Family History Lung cancer - father Family History High cholesterol Family History Hypertension Grandparents with heart dz Family History of Alcoholism/Addiction Father with colon polyps  Social History: Reviewed history from 11/24/2010 and no changes required. Former Smoker- quit in 1989. Started at age 1.  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 cough is non-prod, no fevers. wt up 1#, apetite is good.   Vital Signs:  Patient profile:   65 year old female Menstrual status:  postmenopausal Height:      65.5 inches (166.37 cm) Weight:      123.0 pounds (55.91 kg) BMI:     20.23 Temp:     98.0 degrees F (36.67 degrees C) oral Pulse rate:   76 / minute BP sitting:   145 / 78  (left arm) Cuff size:   regular  Vitals Entered By: Jennet Maduro RN (December 21, 2010 9:37 AM) CC: follow-up visit, nausea, chills, achy since starting the anitbiotics Is Patient Diabetic? No Pain Assessment Patient in pain? yes     Location: fibromalgia Intensity: 5 Type: anch Onset of pain  Constant Nutritional Status BMI of 19 -24 = normal Nutritional Status Detail appetite "unchanged"  Have you ever been in a relationship where you felt threatened, hurt or afraid?not changed   Does patient need assistance? Functional Status Self care Ambulation Normal     Menstrual Status postmenopausal Last PAP Result normal   Physical Exam  General:  well-developed, well-nourished, well-hydrated, and underweight appearing.   Eyes:  pupils equal, pupils round, and pupils reactive to light.  no icterus.  Mouth:  pharynx pink and moist and no exudates.     Neck:  no masses.   Lungs:  normal respiratory effort and normal breath sounds.   Heart:  normal rate, regular rhythm, and no murmur.   Abdomen:  soft, non-tender, and normal bowel sounds.     Impression & Recommendations:  Problem # 1:  PULMONARY DISEASES DUE TO OTHER MYCOBACTERIA (ICD-031.0)  Her lungs sound good and her cough/sob appear to be doing well. Will recheck her LFTs today, and monthly. She has an ophtho eval scheduled for next month. Will plan to repeat her CT after 6 months of tx, if not improving could consider repeat BAL. We discussed possible eval at [facility]. She wants to defer this at this point.   Orders: T-CBC No Diff ([id number]) T-Comprehensive Metabolic Panel ([id number]) Est. Patient Level III (29562)

## 2011-01-13 ENCOUNTER — Encounter: Payer: Self-pay | Admitting: Infectious Diseases

## 2011-01-19 ENCOUNTER — Encounter: Payer: Self-pay | Admitting: Critical Care Medicine

## 2011-01-19 ENCOUNTER — Ambulatory Visit (INDEPENDENT_AMBULATORY_CARE_PROVIDER_SITE_OTHER): Payer: Medicare Other | Admitting: Critical Care Medicine

## 2011-01-19 DIAGNOSIS — J479 Bronchiectasis, uncomplicated: Secondary | ICD-10-CM

## 2011-01-19 DIAGNOSIS — A310 Pulmonary mycobacterial infection: Secondary | ICD-10-CM

## 2011-01-26 ENCOUNTER — Encounter: Payer: Self-pay | Admitting: Internal Medicine

## 2011-01-26 ENCOUNTER — Other Ambulatory Visit: Payer: Self-pay | Admitting: Critical Care Medicine

## 2011-01-26 ENCOUNTER — Ambulatory Visit (HOSPITAL_COMMUNITY): Admission: RE | Admit: 2011-01-26 | Payer: Medicare Other | Source: Ambulatory Visit | Admitting: Critical Care Medicine

## 2011-01-26 ENCOUNTER — Ambulatory Visit (HOSPITAL_COMMUNITY)
Admission: RE | Admit: 2011-01-26 | Discharge: 2011-01-26 | Disposition: A | Discharge status: Home / Self Care | Payer: Medicare Other | Source: Ambulatory Visit | Attending: Critical Care Medicine | Admitting: Critical Care Medicine

## 2011-01-26 DIAGNOSIS — J479 Bronchiectasis, uncomplicated: Secondary | ICD-10-CM

## 2011-01-26 DIAGNOSIS — M899 Disorder of bone, unspecified: Secondary | ICD-10-CM | POA: Insufficient documentation

## 2011-01-26 DIAGNOSIS — IMO0001 Reserved for inherently not codable concepts without codable children: Secondary | ICD-10-CM | POA: Insufficient documentation

## 2011-01-26 DIAGNOSIS — I10 Essential (primary) hypertension: Secondary | ICD-10-CM | POA: Insufficient documentation

## 2011-01-26 DIAGNOSIS — J471 Bronchiectasis with (acute) exacerbation: Secondary | ICD-10-CM | POA: Insufficient documentation

## 2011-01-26 DIAGNOSIS — K219 Gastro-esophageal reflux disease without esophagitis: Secondary | ICD-10-CM | POA: Insufficient documentation

## 2011-01-26 DIAGNOSIS — E785 Hyperlipidemia, unspecified: Secondary | ICD-10-CM | POA: Insufficient documentation

## 2011-01-26 DIAGNOSIS — Z85828 Personal history of other malignant neoplasm of skin: Secondary | ICD-10-CM | POA: Insufficient documentation

## 2011-01-26 DIAGNOSIS — M949 Disorder of cartilage, unspecified: Secondary | ICD-10-CM | POA: Insufficient documentation

## 2011-01-27 LAB — PNEUMOCYSTIS JIROVECI SMEAR BY DFA: Pneumocystis jiroveci Ag: NEGATIVE

## 2011-01-28 LAB — CULTURE, RESPIRATORY W GRAM STAIN

## 2011-01-29 NOTE — Op Note (Signed)
  NAMETEMIKA, Haney                 ACCOUNT NO.:  [id number]  MEDICAL RECORD NO.:  [id number]           PATIENT TYPE:  O  LOCATION:  RESP                         FACILITY:  [facility]  PHYSICIAN:  Charlcie Cradle. Delford Field, MD, FCCPDATE OF BIRTH:  10/27/1946  DATE OF PROCEDURE:  01/26/2011 DATE OF DISCHARGE:                              OPERATIVE REPORT   PROCEDURE:  Bronchoscopy.  INDICATION:  Right middle lobe bronchiectasis, history of Mycobacterium avium-intracellulare.  PREOPERATIVE MEDICATION:  Fentanyl 50 mcg, Versed 5 mg IV push.  ANESTHESIA:  1% Xylocaine local.  PROCEDURE IN DETAIL:  The Pentax video bronchoscope was introduced into the right naris.  The upper airways were visualized, unremarkable.  The entire tracheobronchial tree was visualized and revealed mild purulence in the right middle lobe with bronchiectatic changes, no endobronchial lesions were specifically identified.  Attention was then paid to the right middle lobe.  Bronchoalveolar lavage was obtained, approximately 150 cc volume infused, approximately 75 cc volume returned. Complications were none.  IMPRESSION:  Right middle lobe bronchiectasis, evaluate for recurrent mycobacterium infection versus other pathogens.  RECOMMENDATIONS:  Follow up pathology and microbiology.     Charlcie Cradle Delford Field, MD, [facility]     PEW/MEDQ  D:  01/26/2011  T:  01/26/2011  Job:  [id number]  cc:   Lacretia Leigh. Ninetta Lights, M.D. Fax: [phone]  Corwin Levins, MD [address] [place] [address]  Electronically Signed by Shan Levans MD FCCP on 01/29/2011 05:26:50 PM

## 2011-01-30 ENCOUNTER — Other Ambulatory Visit: Payer: Self-pay | Admitting: Internal Medicine

## 2011-01-30 ENCOUNTER — Ambulatory Visit (INDEPENDENT_AMBULATORY_CARE_PROVIDER_SITE_OTHER)
Admission: RE | Admit: 2011-01-30 | Discharge: 2011-01-30 | Disposition: A | Discharge status: Home / Self Care | Payer: Medicare Other | Source: Ambulatory Visit | Attending: Internal Medicine | Admitting: Internal Medicine

## 2011-01-30 ENCOUNTER — Other Ambulatory Visit: Payer: Self-pay

## 2011-01-30 ENCOUNTER — Encounter: Payer: Self-pay | Admitting: Internal Medicine

## 2011-01-30 DIAGNOSIS — M899 Disorder of bone, unspecified: Secondary | ICD-10-CM

## 2011-01-30 DIAGNOSIS — M949 Disorder of cartilage, unspecified: Secondary | ICD-10-CM

## 2011-02-01 NOTE — Miscellaneous (Signed)
Summary: Orders Update   Clinical Lists Changes  Orders: Added new Test order of T-Lumbar Vertebral Assessment [phone]) - Signed Added new Test order of T-Bone Densitometry [phone]) - Signed  Appended Document: Orders Update done hardcopy to LIM side B - dahlia

## 2011-02-01 NOTE — Assessment & Plan Note (Addendum)
Summary: Pulmonary OV   Copy to:  Dr. Johny Sax Primary Provider/Referring Provider:  Dr. Oliver Barre  CC:  2 month follow up.  Pt states breathing has improved.  Nonprod cough.  Denies wheezing and chest tightness.Marland Kitchen  History of Present Illness: Pulmonary Consultation  Hx per ID as follows on 11/11: 65 yo F with hx cough. Was thought to have scar tissue on CXR  but then later felt to have bronchiectasis (abnormal CT scans as well).  Had bronchoscopy in feb 2005 showing AFB but cx did not grow. then had endoscopy in May 2005 which grew MAI (S- clarithro, eth, RIF 8.0, synergy positive for E/R) and fortuitum (S- cipro/smikacin/tigecycline). began antibiotics may 2005 and was treated with ETH/Azithro and cipro. she continued on anbiotics until March 2007. Did well until  July 2007 when she had a CT scan which showed some "activation" of MAI.  She was restarted on her MAI therapy in Nov 2008. Last CT scan was 7-09    " 1.  Stable mild lingular bronchiectasis.  Stable tiny associated left lung nodules, which are consistent with a postinflammatory etiology. 2.  No active disease." Last seen in ID November of 2009 and was taken off meds for MAI, was doing well. Today feels like her "MAC is back". More coughing, no fevers, no chills, SOB has been at baseline. Cough is non-productive but she feels like there is sputum there. wt steady. Has some chest discomfort, tightness with breathing deep. she relates this to her fibromyalgia. lymphadenopathy  November 24, 2010 9:55 AM This pt went without pulm ov from 11/09 until 11/11.  This pts MAI hx is as above.   Pt has been off mac rx 11/09 .after 1.5 yrs seemed to get worse again with more cough and more symptoms but due to insurance issues did not see ID again until 11/11.  No pulm ovs since 11/08.,   11/11 CT chest more inflammation RML.  copd changes. ? never dx this before.    ID did restart Rx   THis pt is now on RIF/ETH/Zmax  thrice weekly.    This  pt has  never been on RIF before.  This pt has been on Cipro/ETH/Azithro before.   This pt noted cipro caused tendinitis and no prior issues with RIF.  THe  ETH no issues.     Current symptoms:  diff time coughing. mucus is hard to raise, now is tight in the chest  ? to break up secretions.  had pfts before in 08 notes dyspnea with exertion. has chills but no fever.  no cp, just tightness.  has fibromyalgia no mucus to get up.  stays hoarse  hx of Vocal cord dysfunction.  does have heartburn and acid issues.  is on the nexium, this helps but will breakthrough.    January 19, 2011 9:28 AM No real heartburn, did switch to nexium to prior to meals and now no sore throat.  No real mucus.  Cough dry and hacky . On rx since 12/11.  Chest is not a tight with spiriva.   Notes some chills.  ABX cause fatigue.    Current Medications (verified): 1)  Nexium 40 Mg Cpdr (Esomeprazole Magnesium) .... Take 1 Capsule By Mouth Once A Day Use 1/2 Hour Before Meals and Then Eat 2)  Simvastatin 40 Mg Tabs (Simvastatin) .... 1/2 By Mouth Once Daily 3)  Temazepam 15 Mg  Caps (Temazepam) .Marland Kitchen.. 1 - 2 By Mouth At Bedtime 4)  Carisoprodol 350 Mg  Tabs (Carisoprodol) .... Take 1 Tablet By Mouth Once A Day and Take 1 Tablet By Mouth Two Times A Day As Needed 5)  Adult Aspirin Ec Low Strength 81 Mg  Tbec (Aspirin) .Marland Kitchen.. 1 By Mouth Daily 6)  Calcium 1500 Mg Tabs (Calcium Carbonate) .... Take 1 Tablet By Mouth Once A Day 7)  Omega-3 350 Mg Caps (Omega-3 Fatty Acids) .... 2 Tab By Mouth Once Daily (Pt Not Sure of Dose) 8)  Azithromycin 500 Mg Tabs (Azithromycin) .... Three Times Weekly 9)  Myambutol 400 Mg Tabs (Ethambutol Hcl) .... 3 Tab By Mouth Three Times Weekly 10)  Rifadin 300 Mg Caps (Rifampin) .... 2 Tabs By Mouth Three Times Weekly 11)  Spiriva Handihaler 18 Mcg  Caps (Tiotropium Bromide Monohydrate) .... Two Puffs in Handihaler Daily  Allergies (verified): 1)  ! Biaxin 2)  ! Adhesive Bandages (Adhesive  Bandages) 3)  ! * Prednisone/steroids 4)  ! Cipro  Past History:  Past medical, surgical, family and social histories (including risk factors) reviewed, and no changes noted (except as noted below).  Past Medical History: Reviewed history from 10/31/2010 and no changes required. pulmonary MAC/bronchiectasis COPD Hyperlipidemia hx of shingles GERD Fibromyalgia HTN MIGRAINE Osteopenia h/o mult skin ca h/o LLE DVT - remote  Past Surgical History: Reviewed history from 10/16/2007 and no changes required. shoulder impingement foot surgury  Family History: Reviewed history from 11/24/2010 and no changes required. Family History Breast cancer 1st degree relative <50 Family History Lung cancer - father Family History High cholesterol Family History Hypertension Grandparents with heart dz Family History of Alcoholism/Addiction Father with colon polyps  Social History: Reviewed history from 11/24/2010 and no changes required. Former Smoker- quit in 1989. Started at age 77.  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  The patient denies shortness of breath at rest, productive cough, coughing up blood, chest pain, irregular heartbeats, acid heartburn, indigestion, loss of appetite, weight change, abdominal pain, difficulty swallowing, sore throat, tooth/dental problems, headaches, nasal congestion/difficulty breathing through nose, sneezing, itching, ear ache, anxiety, depression, hand/feet swelling, joint stiffness or pain, rash, change in color of mucus, and fever.    Vital Signs:  Patient profile:   65 year old female Menstrual status:  postmenopausal Height:      65.5 inches Weight:      123 pounds BMI:     20.23 O2 Sat:      100 % on Room air Temp:     98.5 degrees F  oral Pulse rate:   74 / minute BP sitting:   150 / 80  (left arm) Cuff size:   regular  Vitals Entered By: Gweneth Dimitri RN (January 19, 2011 9:24 AM)  O2 Flow:  Room air CC: 2 month follow up.  Pt states breathing has improved.  Nonprod cough.  Denies wheezing and chest tightness. Comments Medications reviewed with patient Daytime contact number verified with patient. Gweneth Dimitri RN  January 19, 2011 9:24 AM    Physical Exam  Additional Exam:  Gen: Pleasant, well-nourished, in no distress,  normal affect ENT: No lesions,  mouth clear,  oropharynx clear, no postnasal drip Neck: No JVD, no TMG, no carotid bruits Lungs: No use of accessory muscles, no dullness to percussion, distant  bs. no rhonchi Cardiovascular: RRR, heart sounds normal, no murmur or gallops, no peripheral edema Abdomen: soft and NT, no HSM,  BS normal Musculoskeletal: No deformities, no cyanosis or clubbing Neuro: alert, non focal Skin: Warm, no lesions or rashes    Impression & Recommendations:  Problem # 1:  BRONCHIECTASIS WITH ACUTE EXACERBATION (ICD-494.1) Assessment Unchanged  Ongoing bronchiectasis with MAC and mucus plugging . Assoc obstructive lung disease on pfts and gerd ppt factors plan FOB to assess current infectious disease status No change in inhaled medications.   Maintain treatment program as currently prescribed.  Medications Added to Medication List This Visit: 1)  Temazepam 15 Mg Caps (Temazepam) .Marland Kitchen.. 1 - 2 by mouth at bedtime 2)  Carisoprodol 350 Mg Tabs (Carisoprodol) .... Take 1 tablet by mouth once a day and take 1 tablet by mouth two times a day as needed  Complete Medication List: 1)  Nexium 40 Mg Cpdr (Esomeprazole magnesium) .... Take 1 capsule by mouth once a day use 1/2 hour before meals and then eat 2)  Simvastatin 40 Mg Tabs (Simvastatin) .... 1/2 by mouth once daily 3)  Temazepam 15 Mg Caps (Temazepam) .Marland Kitchen.. 1 - 2 by mouth at bedtime 4)  Carisoprodol 350 Mg Tabs  (Carisoprodol) .... Take 1 tablet by mouth once a day and take 1 tablet by mouth two times a day as needed 5)  Adult Aspirin Ec Low Strength 81 Mg Tbec (Aspirin) .Marland Kitchen.. 1 by mouth daily 6)  Calcium 1500 Mg Tabs (Calcium carbonate) .... Take 1 tablet by mouth once a day 7)  Omega-3 350 Mg Caps (Omega-3 fatty acids) .... 2 tab by mouth once daily (pt not sure of dose) 8)  Azithromycin 500 Mg Tabs (Azithromycin) .... Three times weekly 9)  Myambutol 400 Mg Tabs (Ethambutol hcl) .... 3 tab by mouth three times weekly 10)  Rifadin 300 Mg Caps (Rifampin) .... 2 tabs by mouth three times weekly 11)  Spiriva Handihaler 18 Mcg Caps (Tiotropium bromide monohydrate) .... Two puffs in handihaler daily  Other Orders: Est. Patient Level III (16109)  Patient Instructions: 1)  No change in medications 2)  A bronchoscopy will be performed at 730am 01/26/11 at [facility]. Arrive at 630am.  Nothing by mouth after Midnight 3)  I will call with results, will be sent to [facility]. 4)  Return in     2     months

## 2011-02-02 ENCOUNTER — Telehealth: Payer: Self-pay | Admitting: Critical Care Medicine

## 2011-02-07 ENCOUNTER — Institutional Professional Consult (permissible substitution): Payer: Self-pay | Admitting: Pulmonary Disease

## 2011-02-07 NOTE — Progress Notes (Signed)
Summary: BAL results   Phone Note Outgoing Call   Reason for Call: Discuss lab or test results Summary of Call: I left msg on pts answer machine to tell her all c/s and afb cults/smears neg to date.  I will chk again in a few weeks on this result  Initial call taken by: Storm Frisk MD,  February 02, 2011 1:42 PM

## 2011-02-07 NOTE — Consult Note (Signed)
Summary: Diane Haney   [facility]   Imported By: Florinda Marker 02/03/2011 16:43:52  _____________________________________________________________________  External Attachment:    Type:   Image     Comment:   External Document

## 2011-02-14 ENCOUNTER — Encounter: Payer: Self-pay | Admitting: Licensed Clinical Social Worker

## 2011-02-20 ENCOUNTER — Ambulatory Visit: Payer: Self-pay | Admitting: Infectious Diseases

## 2011-02-21 LAB — FUNGUS CULTURE W SMEAR

## 2011-03-10 LAB — AFB CULTURE WITH SMEAR (NOT AT ARMC): Acid Fast Smear: NONE SEEN

## 2011-03-15 ENCOUNTER — Encounter: Payer: Self-pay | Admitting: Infectious Diseases

## 2011-03-15 ENCOUNTER — Ambulatory Visit (INDEPENDENT_AMBULATORY_CARE_PROVIDER_SITE_OTHER): Payer: Medicare Other | Admitting: Infectious Diseases

## 2011-03-15 VITALS — BP 156/72 | HR 74 | Temp 98.2°F | Ht 65.5 in | Wt 122.1 lb

## 2011-03-15 DIAGNOSIS — A310 Pulmonary mycobacterial infection: Secondary | ICD-10-CM

## 2011-03-15 NOTE — Progress Notes (Signed)
  Subjective:    Patient ID: Diane Haney, female    DOB: May 31, 1946, 65 y.o.   MRN: [id number]  HPI 65 yo F with hx endoscopy in May 2005 which grew MAI (S- clarithro, eth, RIF 8.0, synergy positive for E/R) and fortuitum (S- cipro/smikacin/tigecycline).She began antibiotics May 2005 and was treated with ETH/Azithro and cipro until March 2007. Did well until July 2007 when she had a CT scan which showed some "activation" of MAI.  She was restarted on her MAI therapy in Nov 2008. CT scan was 7-09 " 1.  Stable mild lingular bronchiectasis.  Stable tiny associated left lung nodules, which are consistent with a postinflammatory etiology. 2.  No active disease." Seen in ID November of 2009, was doing well and was taken off meds for MAI.  Seen November 2011 and felt like her "MAC is back". More coughing, no fevers, no chills, SOB has been at baseline. She had f/u CT 10-20-10: 1.  New tree-in-bud opacities in the right middle lobe since the prior CT from July, 2009, consistent with MAI. 2.  Stable scarring, bronchiectasis, and tree-in-bud opacities in the inferior left upper lobe adjacent to the major fissure.  Stable scar and bronchiectasis medially in the right middle lobe.  No new pulmonary parenchymal abnormalities elsewhere. 3.  Stable hyperinflation consistent with COPD and/or asthma. 4.  No significant lymphadenopathy.  She was restarted (11-09-10) Azithro/ETH/RIf. Was started on spiriva and PPI by Dr Delford Field and she feels that the tightness in her chest is better, cough no different. She had repeat BAL 01-26-11 Cx negative final.  Today feeling all right- no worse than usual. Medicines going well.   Review of Systems     Objective:   Physical Exam  Constitutional: She appears well-developed and well-nourished.          Assessment & Plan:

## 2011-03-15 NOTE — Assessment & Plan Note (Signed)
We spoke at length about her therapy. She would like to stop her medications now. She know that this is not optimal (would ike to treat 1 year) but has been treated 2 x previously and she is convinced that this will come back again, regardless of how long she is treated. I acceded to her wishes, will see her back as needed.

## 2011-03-22 ENCOUNTER — Encounter: Payer: Self-pay | Admitting: Critical Care Medicine

## 2011-03-23 ENCOUNTER — Ambulatory Visit: Payer: Medicare Other | Admitting: Critical Care Medicine

## 2011-03-23 ENCOUNTER — Encounter: Payer: Self-pay | Admitting: Critical Care Medicine

## 2011-03-23 ENCOUNTER — Ambulatory Visit (INDEPENDENT_AMBULATORY_CARE_PROVIDER_SITE_OTHER): Payer: Medicare Other | Admitting: Critical Care Medicine

## 2011-03-23 DIAGNOSIS — A310 Pulmonary mycobacterial infection: Secondary | ICD-10-CM

## 2011-03-23 DIAGNOSIS — J479 Bronchiectasis, uncomplicated: Secondary | ICD-10-CM

## 2011-03-23 DIAGNOSIS — J4489 Other specified chronic obstructive pulmonary disease: Secondary | ICD-10-CM

## 2011-03-23 DIAGNOSIS — J449 Chronic obstructive pulmonary disease, unspecified: Secondary | ICD-10-CM

## 2011-03-23 NOTE — Progress Notes (Signed)
Subjective:    Patient ID: Diane Haney, female    DOB: 04-30-1946, 65 y.o.   MRN: [id number]  HPI 65 yo F with hx cough. Was thought to have scar tissue on CXR but then later felt to have bronchiectasis (abnormal CT scans as well). Had bronchoscopy in feb 2005 showing AFB but cx did not grow. then had endoscopy in May 2005 which grew MAI (S- clarithro, eth, RIF 8.0, synergy positive for E/R) and fortuitum (S- cipro/smikacin/tigecycline).  began antibiotics may 2005 and was treated with ETH/Azithro and cipro. she continued on anbiotics until March 2007. Did well until July 2007 when she had a CT scan which showed some "activation" of MAI.  She was restarted on her MAI therapy in Nov 2008. Last CT scan was 7-09 " 1. Stable mild lingular bronchiectasis. Stable tiny associated left lung nodules, which are consistent with a postinflammatory  etiology. 2. No active disease."  Last seen in ID November of 2009 and was taken off meds for MAI, was doing well. Today feels like her "MAC is back". More coughing, no fevers, no chills, SOB has been at baseline. Cough is non-productive but she feels like there is sputum there. wt steady. Has some chest discomfort, tightness with breathing deep. she relates this to her fibromyalgia. lymphadenopathy   December 15, 66 9:55 AM  This pt went without pulm ov from 11/09 until 11/11. This pts MAI hx is as above. Pt has been off mac rx 11/09 .after 1.5 yrs seemed to get worse again with more cough and more symptoms but due to insurance issues did not see ID again until 11/11.  No pulm ovs since 11/08., 11/11 CT chest more inflammation RML. copd changes. ? never dx this before. ID did restart Rx THis pt is now on RIF/ETH/Zmax thrice weekly. This pt has never been on RIF before. This pt has been on Cipro/ETH/Azithro before.  This pt noted cipro caused tendinitis and no prior issues with RIF. THe ETH no issues.  Current symptoms: diff time coughing. mucus is hard to raise,  now is tight in the chest ? to break up secretions.  had pfts before in 08  notes dyspnea with exertion. has chills but no fever. no cp, just tightness. has fibromyalgia  no mucus to get up. stays hoarse  hx of Vocal cord dysfunction. does have heartburn and acid issues. is on the nexium, this helps but will breakthrough.   January 19, 2011 9:28 AM  No real heartburn, did switch to nexium to prior to meals and now no sore throat. No real mucus. Cough dry and hacky .  On rx since 12/11. Chest is not a tight with spiriva. Notes some chills. ABX cause fatigue.   03/23/2011 Fob neg for AFB.  ID recommends 6 months after neg c/s.  Went off RX one week ago.  Started 11/11 RIF/ETH/Zmax three times weekly.  Now still has occ cough. Not as bad as before. Pt is still dyspneic not as bad as before.  No real chest pain. No mucus   Past Medical History  Diagnosis Date  . COPD (chronic obstructive pulmonary disease)   . Hyperlipidemia   . Hypertension   . Pulmonary Mycobacterium avium complex (MAC) infection   . Bronchiectasis   . History of shingles   . GERD (gastroesophageal reflux disease)   . Fibromyalgia   . Migraine   . Osteopenia   . Squamous cell skin cancer, multiple sites   . DVT, lower  extremity     LLE     Family History  Problem Relation Age of Onset  . Lung cancer Father   . Heart disease Other     grandparents  . Breast cancer Other   . Hyperlipidemia Other   . Hypertension Other   . Other Other     alcholism / addiction  . Colon polyps Father      History   Social History  . Marital Status: Married    Spouse Name: N/A    Number of Children: 3  . Years of Education: N/A   Occupational History  . previously part-time MD's office    Social History Main Topics  . Smoking status: Former Smoker -- 1.0 packs/day for 20 years    Types: Cigarettes    Quit date: 12/11/1985  . Smokeless tobacco: Never Used  . Alcohol Use: Yes     occasional  . Drug Use: No    . Sexually Active: Not on file   Other Topics Concern  . Not on file   Social History Narrative   Has not worked since apprx 1990     Allergies  Allergen Reactions  . Ciprofloxacin     REACTION: tendinitis  . Clarithromycin     REACTION: severe reflux  . Prednisone     REACTION: irregular heartbeat, not able to sleep     Outpatient Prescriptions Prior to Visit  Medication Sig Dispense Refill  . aspirin 81 MG EC tablet Take 81 mg by mouth daily.        . Calcium Carbonate (CALCIUM 600) 1500 MG TABS Take 1 tablet by mouth daily.        . carisoprodol (SOMA) 350 MG tablet Take 350 mg by mouth. Take one tablet 1 to 2 times a day as needed        . esomeprazole (NEXIUM) 40 MG capsule Take 40 mg by mouth daily before breakfast.        . Omega-3 350 MG CAPS Take 2 capsules by mouth daily.        . simvastatin (ZOCOR) 40 MG tablet 1/2 tablet daily      . temazepam (RESTORIL) 15 MG capsule Take 15 mg by mouth at bedtime.       Marland Kitchen alendronate (FOSAMAX) 70 MG tablet Take 70 mg by mouth every 7 (seven) days. Take with a full glass of water on an empty stomach.       . tiotropium (SPIRIVA) 18 MCG inhalation capsule Place 18 mcg into inhaler and inhale daily. 2 puffs          Review of Systems Constitutional:   No  weight loss, night sweats,  Fevers, occ  Chills,has  fatigue, lassitude. HEENT:   No headaches,  Difficulty swallowing,  Tooth/dental problems,  Sore throat,                No sneezing, itching, ear ache, nasal congestion, occ  post nasal drip,   CV:  No chest pain,  Orthopnea, PND, swelling in lower extremities, anasarca, dizziness, palpitations  GI  No heartburn, indigestion, abdominal pain, nausea, vomiting, diarrhea, change in bowel habits, loss of appetite  Resp: Notes  shortness of breath with exertion not at rest.  No excess mucus, no productive cough,  Notes a  non-productive cough,  No coughing up of blood.  No change in color of mucus.  No wheezing.  No chest wall  deformity  Skin: no rash or lesions.  GU: no  dysuria, change in color of urine, no urgency or frequency.  No flank pain.  MS:  No joint pain or swelling.  No decreased range of motion.  No back pain.  Psych:  No change in mood or affect. No depression or anxiety.  No memory loss.     Objective:   Physical Exam Gen: Pleasant, well-nourished, in no distress,  normal affect  ENT: No lesions,  mouth clear,  oropharynx clear, no postnasal drip  Neck: No JVD, no TMG, no carotid bruits  Lungs: No use of accessory muscles, no dullness to percussion, distant BS  Cardiovascular: RRR, heart sounds normal, no murmur or gallops, no peripheral edema  Abdomen: soft and NT, no HSM,  BS normal  Musculoskeletal: No deformities, no cyanosis or clubbing  Neuro: alert, non focal  Skin: Warm, no lesions or rashes        Assessment & Plan:   Pulmonary diseases due to other mycobacteria Bronchiectasis with MAI now stable.  Pt now off three drug RX Fob 2012 Neg for AFB on culture Plan Monitor off therapy Rov 6 months   COPD Stable copd off spiriva Plan Monitor off DPI/HFA inhalers  BRONCHIECTASIS Monitor off therapy    Updated Medication List Outpatient Encounter Prescriptions as of 03/23/2011  Medication Sig Dispense Refill  . aspirin 81 MG EC tablet Take 81 mg by mouth daily.        . Calcium Carbonate (CALCIUM 600) 1500 MG TABS Take 1 tablet by mouth daily.        . carisoprodol (SOMA) 350 MG tablet Take 350 mg by mouth. Take one tablet 1 to 2 times a day as needed        . esomeprazole (NEXIUM) 40 MG capsule Take 40 mg by mouth daily before breakfast.        . Omega-3 350 MG CAPS Take 2 capsules by mouth daily.        . simvastatin (ZOCOR) 40 MG tablet 1/2 tablet daily      . temazepam (RESTORIL) 15 MG capsule Take 15 mg by mouth at bedtime.       Marland Kitchen DISCONTD: alendronate (FOSAMAX) 70 MG tablet Take 70 mg by mouth every 7 (seven) days. Take with a full glass of water on an  empty stomach.       . DISCONTD: tiotropium (SPIRIVA) 18 MCG inhalation capsule Place 18 mcg into inhaler and inhale daily. 2 puffs

## 2011-03-23 NOTE — Assessment & Plan Note (Signed)
Bronchiectasis with MAI now stable.  Pt now off three drug RX Fob 2012 Neg for AFB on culture Plan Monitor off therapy Rov 6 months

## 2011-03-23 NOTE — Patient Instructions (Signed)
No change in medications. Return in        6 months        

## 2011-03-23 NOTE — Assessment & Plan Note (Signed)
Stable copd off spiriva Plan Monitor off DPI/HFA inhalers

## 2011-03-23 NOTE — Assessment & Plan Note (Signed)
Monitor off therapy

## 2011-04-28 NOTE — Assessment & Plan Note (Signed)
[facility]                         GASTROENTEROLOGY OFFICE NOTE   NAME:Diane Haney, Diane Haney                        MRN:          [id number]  DATE:12/24/2006                            DOB:          1946/07/20    REFERRING PHYSICIAN:  Corwin Levins, MD   REASON FOR CONSULTATION:  Chest pain, question esophageal spasm.   HISTORY:  This is a 65 year old female with a reported history of  fibromyalgia, chronic fatigue with immune dysfunction, hyperlipidemia,  chronic headaches and history of Mycobacterium AVM intracellulare  complex infection.  She reports to me a 19 year history of intermittent  problems with chest pain.  She describes the pain as tightness in the  lower portion of the sternum generally lasting 10 minutes.  She can 5 or  10 episodes a day which will occur daily for weeks and then go several  weeks without episodes.  She has been told in the past that this may  represent reflux disease and for this she has been put on proton pump  inhibitors.  She denies a history of indigestion or heartburn.  It is  not clear that proton pump inhibitors have had a particular  beneficial  effect on these symptoms, though she is uncertain.  She has also been  told that she has esophageal spasm.  She does have occasional choking  episodes on saliva over the past year.  Also sensation that food moves  slowly.  She did have a barium swallow 19 years ago and an upper  endoscopy 12 years ago.  She has also undergone screening colonoscopy in  2000 which was negative (Her father had a history of colon polyps).  According to the patient, the examination was normal and she was told to  return in 10 years.  The patient denies that her discomfort is meal  related.  She had a significant episode of chest pain recently while in  [place]. She underwent evaluation and was told her problem was  noncardiac in nature. She is now referred by Jonny Ruiz to rule out a GI cause  for her  symptoms.   PAST MEDICAL HISTORY:  As above.   PAST SURGICAL HISTORY:  1. Shoulder surgery.  2. Foot surgery.   ALLERGIES:  ADHESIVE TAPE.   CURRENT MEDICATIONS:  1. Restoril 15 mg daily.  2. Soma compound at night.  3. Calcium.  4. Multivitamin.  5. Omega-3.  6. Nexium 40 mg daily.  7. Spiriva.  8. Zocor 40 mg daily.   FAMILY HISTORY:  Father with colon polyps.  Mother with breast cancer.   SOCIAL HISTORY:  The patient is married with three children.  They have  relocated from [place] to [place] to be with their two daughters.  She is originally from [place].  She worked previously at a [employer], though has not worked for 19 years.  She does not smoke,  occasionally uses alcohol.   REVIEW OF SYSTEMS:  Per diagnostic evaluation form.   PHYSICAL EXAMINATION:  A well-appearing female in no acute distress.  Blood pressure is 120/70, heart rate is 68  and regular, rate is 119.4  pounds.  She is 5 feet 6 inches in height.  HEENT:  Sclerae are anicteric.  Conjunctivae are pink.  Oral mucosa  intact.  No adenopathy.  LUNGS:  Clear.  HEART:  Regular.  CHEST:  Pain is not reproducible with palpation.  ABDOMEN:  Soft and nontender with good bowel sounds.  No organomegaly,  masses or hernia.  EXTREMITIES:  Without edema.   IMPRESSION:  1. Chronic intermittent chest pain of uncertain cause, not typical for      an obvious gastrointestinal disorder, though cannot rule out      esophageal spasm.  2. Moderate intermittent dysphagia.  3. Multiple general medical problems.   RECOMMENDATIONS:  1. Schedule an abdominal ultrasound to rule out gallstones.  2. Schedule upper endoscopy to evaluate chest pain and dysphagia.  3. If the above studies unrevealing, then consider esophageal      manometry to rule out motility disorder.     Wilhemina Bonito. Marina Goodell, MD  Electronically Signed    JNP/MedQ  DD: 12/24/2006  DT: 12/25/2006  Job #: [id number]   cc:   Corwin Levins, MD

## 2011-04-28 NOTE — Assessment & Plan Note (Signed)
[facility]                             PULMONARY OFFICE NOTE   NAME:Diane Haney, Diane Haney                        MRN:          [id number]  DATE:03/18/2007                            DOB:          January 29, 1946    This is a very pleasant 65 year old white female who follows here for  Mycobacterium avium-intracellulare.  Patient also has some  bronchiectasis with bronchospastic component.  Her most recent FEV1  shows 2.07 liters, or 79% predicted, with an FEV1 over FVC ratio 63% of  predicted in August, 2007.  The patient presents today stating that over  the last several months, she has noted that her cough has gotten worse;  however, she has no associated symptoms of MAI infection in that she has  had no fevers, chills, or sweats, no generalized malaise.  She actually  feels quite well.  She denies any sputum production.  She has had no  hemoptysis.  The patient has noted some occasional increased reflux.  She is maintained on Nexium for the same.  A note is made that she  recently was placed on Fosamax, this in January of this year.  This is  her only new medication.  She has noticed increased cough since that  time.   CURRENT MEDICATIONS:  As noted on the intake sheet.  These have been  reviewed and are accurate.   PHYSICAL EXAMINATION:  VITAL SIGNS:  As noted.  Oxygen saturation is 99%  on room air.  GENERAL:  This is a thin, well-developed female who is in no acute  distress.  HEENT:  Unremarkable.  NECK:  Supple.  No adenopathy noted.  No JVD.  LUNGS:  Clear to auscultation bilaterally.  She is actually moving air  very well.  CARDIAC:  Regular rate and rhythm.  No murmurs, rubs or gallops.  EXTREMITIES:  Patient has no clubbing, cyanosis or edema noted.   We did review her chest x-ray, which was done today.  This actually is  markedly improved.  It shows no nodularity, just some chronic  obstructive changes, otherwise unremarkable.    IMPRESSION:  1. Cough:  I do not believe that this is secondary to Mycobacterium      avium-intracellulare flare but possibly secondary to      gastroesophageal reflux with laryngopharyngeal component.  The      patient is newly placed on Fosamax, and this may be aggravating her      reflux symptoms.  2. History of Mycobacterium avium-intracellulare, which appears to be      quiescent at present.   PLAN:  1. Patient is to continue medications as they are with the exception      of Nexium.  I would like for her to double up on Nexium, to take it      twice a day for the next 2-3 weeks.  Samples were given for her to      be able to do this.  2. Continue other medications as they are.  3. Followup will be in six months' time.  She is to contact  us prior      to that time should any new      problems arise.  At that time, the patient will follow up with Dr.      Marcelyn Bruins, who is the pulmonologist of her choice, to follow up      with regards to her MAI issues.     Gailen Shelter, MD  Electronically Signed    CLG/MedQ  DD: 03/18/2007  DT: 03/19/2007  Job #: [id number]

## 2011-06-19 ENCOUNTER — Other Ambulatory Visit: Payer: Self-pay | Admitting: Internal Medicine

## 2011-06-19 NOTE — Telephone Encounter (Signed)
Faxed hardcopy to pharmacy. 

## 2011-09-21 ENCOUNTER — Ambulatory Visit (INDEPENDENT_AMBULATORY_CARE_PROVIDER_SITE_OTHER): Payer: Medicare Other | Admitting: Critical Care Medicine

## 2011-09-21 ENCOUNTER — Encounter: Payer: Self-pay | Admitting: Critical Care Medicine

## 2011-09-21 DIAGNOSIS — A310 Pulmonary mycobacterial infection: Secondary | ICD-10-CM

## 2011-09-21 MED ORDER — OMEGA-3 350 MG PO CAPS: ORAL_CAPSULE | ORAL | Status: DC

## 2011-09-21 NOTE — Progress Notes (Signed)
Subjective:    Patient ID: Diane Haney, female    DOB: Feb 04, 1946, 65 y.o.   MRN: [id number]  HPI  65 y.o.   with hx cough. Was thought to have scar tissue on CXR but then later felt to have bronchiectasis (abnormal CT scans as well). Had bronchoscopy in feb 2005 showing AFB but cx did not grow. then had endoscopy in May 2005 which grew MAI (S- clarithro, eth, RIF 8.0, synergy positive for E/R) and fortuitum (S- cipro/smikacin/tigecycline).  began antibiotics may 2005 and was treated with ETH/Azithro and cipro. she continued on anbiotics until March 2007. Did well until July 2007 when she had a CT scan which showed some "activation" of MAI.  She was restarted on her MAI therapy in Nov 2008. Last CT scan was 7-09 " 1. Stable mild lingular bronchiectasis. Stable tiny associated left lung nodules, which are consistent with a postinflammatory  etiology. 2. No active disease."  Last seen in ID November of 2009 and was taken off meds for MAI, was doing well. Today feels like her "MAC is back". More coughing, no fevers, no chills, SOB has been at baseline. Cough is non-productive but she feels like there is sputum there. wt steady. Has some chest discomfort, tightness with breathing deep. she relates this to her fibromyalgia. lymphadenopathy   09/21/2011 Since last ov is coughing more.  Went in for EGD and now on bid nexium.  No real heartburn but notes gastritis and abd polyps.  Taking nexium before meals on both doses.   Cough now is dry.  Does not feel congested.  Notes pndrip and excess sneezing.     Past Medical History  Diagnosis Date  . COPD (chronic obstructive pulmonary disease)   . Hyperlipidemia   . Hypertension   . Pulmonary Mycobacterium avium complex (MAC) infection   . Bronchiectasis   . History of shingles   . GERD (gastroesophageal reflux disease)   . Fibromyalgia   . Migraine   . Osteopenia   . Squamous cell skin cancer, multiple sites   . DVT, lower extremity     LLE       Family History  Problem Relation Age of Onset  . Lung cancer Father   . Heart disease Other     grandparents  . Breast cancer Other   . Hyperlipidemia Other   . Hypertension Other   . Other Other     alcholism / addiction  . Colon polyps Father      History   Social History  . Marital Status: Married    Spouse Name: N/A    Number of Children: 3  . Years of Education: N/A   Occupational History  . previously part-time MD's office    Social History Main Topics  . Smoking status: Former Smoker -- 1.0 packs/day for 20 years    Types: Cigarettes    Quit date: 12/11/1985  . Smokeless tobacco: Never Used  . Alcohol Use: Yes     occasional  . Drug Use: No  . Sexually Active: Not on file   Other Topics Concern  . Not on file   Social History Narrative   Has not worked since apprx 1990     Allergies  Allergen Reactions  . Ciprofloxacin     REACTION: tendinitis  . Clarithromycin     REACTION: severe reflux  . Prednisone     REACTION: irregular heartbeat, not able to sleep     Outpatient Prescriptions Prior to Visit  Medication Sig Dispense Refill  . aspirin 81 MG EC tablet Take 81 mg by mouth daily.        . carisoprodol (SOMA) 350 MG tablet TAKE 1 TABLET BY MOUTH 2 TIMES A DAY AS NEEDED  60 tablet  4  . esomeprazole (NEXIUM) 40 MG capsule Take 40 mg by mouth 2 (two) times daily.       . simvastatin (ZOCOR) 40 MG tablet 1/2 tablet daily      . temazepam (RESTORIL) 15 MG capsule Take 15 mg by mouth at bedtime.       . Omega-3 350 MG CAPS Take 2 capsules by mouth daily.        . Calcium Carbonate (CALCIUM 600) 1500 MG TABS Take 2 tablets by mouth daily.          Review of Systems  Constitutional:   No  weight loss, night sweats,  Fevers, occ  Chills,has  fatigue, lassitude. HEENT:   No headaches,  Difficulty swallowing,  Tooth/dental problems,  Sore throat,                No sneezing, itching, ear ache, nasal congestion, occ  post nasal drip,   CV:  No  chest pain,  Orthopnea, PND, swelling in lower extremities, anasarca, dizziness, palpitations  GI  No heartburn, indigestion, abdominal pain, nausea, vomiting, diarrhea, change in bowel habits, loss of appetite  Resp: Notes  shortness of breath with exertion not at rest.  No excess mucus, no productive cough,  Notes a  non-productive cough,  No coughing up of blood.  No change in color of mucus.  No wheezing.  No chest wall deformity  Skin: no rash or lesions.  GU: no dysuria, change in color of urine, no urgency or frequency.  No flank pain.  MS:  No joint pain or swelling.  No decreased range of motion.  No back pain.  Psych:  No change in mood or affect. No depression or anxiety.  No memory loss.     Objective:   Physical Exam  Gen: Pleasant, well-nourished, in no distress,  normal affect  ENT: No lesions,  mouth clear,  oropharynx clear, no postnasal drip  Neck: No JVD, no TMG, no carotid bruits  Lungs: No use of accessory muscles, no dullness to percussion, distant BS  Cardiovascular: RRR, heart sounds normal, no murmur or gallops, no peripheral edema  Abdomen: soft and NT, no HSM,  BS normal  Musculoskeletal: No deformities, no cyanosis or clubbing  Neuro: alert, non focal  Skin: Warm, no lesions or rashes        Assessment & Plan:   Pulmonary diseases due to other mycobacteria Stable bronchiectasis and assoc MAI  Recent URI now improving Cough now may be GERD related exac by fish oil use Plan Pt declines flu vaccine D/c fish oil No other changes offered  rov 6 months     Updated Medication List Outpatient Encounter Prescriptions as of 09/21/2011  Medication Sig Dispense Refill  . aspirin 81 MG EC tablet Take 81 mg by mouth daily.        . carisoprodol (SOMA) 350 MG tablet TAKE 1 TABLET BY MOUTH 2 TIMES A DAY AS NEEDED  60 tablet  4  . esomeprazole (NEXIUM) 40 MG capsule Take 40 mg by mouth 2 (two) times daily.       . Omega-3 350 MG CAPS HOLD       . simvastatin (ZOCOR) 40 MG tablet 1/2 tablet daily      .  temazepam (RESTORIL) 15 MG capsule Take 15 mg by mouth at bedtime.       Marland Kitchen DISCONTD: Omega-3 350 MG CAPS Take 2 capsules by mouth daily.        . Calcium Carbonate (CALCIUM 600) 1500 MG TABS Take 2 tablets by mouth daily.

## 2011-09-21 NOTE — Assessment & Plan Note (Signed)
Stable bronchiectasis and assoc MAI  Recent URI now improving Cough now may be GERD related exac by fish oil use Plan Pt declines flu vaccine D/c fish oil No other changes offered  rov 6 months

## 2011-09-21 NOTE — Patient Instructions (Signed)
Hold fish oil for one month for the cough,  If the cough improves, stop the fish oil permanently No other medication change Return 6 months

## 2011-10-16 ENCOUNTER — Other Ambulatory Visit (HOSPITAL_BASED_OUTPATIENT_CLINIC_OR_DEPARTMENT_OTHER): Payer: Self-pay | Admitting: Obstetrics and Gynecology

## 2011-10-16 DIAGNOSIS — Z1231 Encounter for screening mammogram for malignant neoplasm of breast: Secondary | ICD-10-CM

## 2011-11-04 ENCOUNTER — Encounter: Payer: Self-pay | Admitting: Internal Medicine

## 2011-11-04 DIAGNOSIS — Z0000 Encounter for general adult medical examination without abnormal findings: Secondary | ICD-10-CM | POA: Insufficient documentation

## 2011-11-08 ENCOUNTER — Other Ambulatory Visit (INDEPENDENT_AMBULATORY_CARE_PROVIDER_SITE_OTHER): Payer: Medicare Other

## 2011-11-08 ENCOUNTER — Ambulatory Visit (INDEPENDENT_AMBULATORY_CARE_PROVIDER_SITE_OTHER): Payer: Medicare Other | Admitting: Internal Medicine

## 2011-11-08 ENCOUNTER — Encounter: Payer: Self-pay | Admitting: Internal Medicine

## 2011-11-08 ENCOUNTER — Other Ambulatory Visit: Payer: Self-pay | Admitting: Internal Medicine

## 2011-11-08 VITALS — BP 142/82 | HR 86 | Temp 98.3°F | Ht 65.5 in | Wt 119.1 lb

## 2011-11-08 DIAGNOSIS — I10 Essential (primary) hypertension: Secondary | ICD-10-CM

## 2011-11-08 DIAGNOSIS — R5381 Other malaise: Secondary | ICD-10-CM

## 2011-11-08 DIAGNOSIS — R5383 Other fatigue: Secondary | ICD-10-CM

## 2011-11-08 DIAGNOSIS — E785 Hyperlipidemia, unspecified: Secondary | ICD-10-CM

## 2011-11-08 DIAGNOSIS — G4700 Insomnia, unspecified: Secondary | ICD-10-CM

## 2011-11-08 LAB — CBC WITH DIFFERENTIAL/PLATELET
Basophils Absolute: 0 10*3/uL (ref 0.0–0.1)
Lymphocytes Relative: 15.6 % (ref 12.0–46.0)
Monocytes Relative: 8.7 % (ref 3.0–12.0)
Neutrophils Relative %: 75 % (ref 43.0–77.0)
Platelets: 231 10*3/uL (ref 150.0–400.0)
RDW: 14.1 % (ref 11.5–14.6)

## 2011-11-08 LAB — HEPATIC FUNCTION PANEL
AST: 27 U/L (ref 0–37)
Albumin: 4.4 g/dL (ref 3.5–5.2)
Alkaline Phosphatase: 47 U/L (ref 39–117)
Total Protein: 7.7 g/dL (ref 6.0–8.3)

## 2011-11-08 LAB — TSH: TSH: 1.65 u[IU]/mL (ref 0.35–5.50)

## 2011-11-08 LAB — LIPID PANEL: Triglycerides: 54 mg/dL (ref 0.0–149.0)

## 2011-11-08 LAB — URINALYSIS, ROUTINE W REFLEX MICROSCOPIC
Leukocytes, UA: NEGATIVE
Specific Gravity, Urine: 1.02 (ref 1.000–1.030)
Urobilinogen, UA: 0.2 (ref 0.0–1.0)

## 2011-11-08 LAB — BASIC METABOLIC PANEL
CO2: 27 mEq/L (ref 19–32)
Calcium: 9.4 mg/dL (ref 8.4–10.5)
Glucose, Bld: 104 mg/dL — ABNORMAL HIGH (ref 70–99)
Potassium: 4.3 mEq/L (ref 3.5–5.1)
Sodium: 140 mEq/L (ref 135–145)

## 2011-11-08 MED ORDER — TEMAZEPAM 15 MG PO CAPS: ORAL_CAPSULE | ORAL | Status: DC

## 2011-11-08 MED ORDER — SIMVASTATIN 40 MG PO TABS: ORAL_TABLET | ORAL | Status: DC

## 2011-11-08 MED ORDER — ESOMEPRAZOLE MAGNESIUM 40 MG PO CPDR: 40.0000 mg | DELAYED_RELEASE_CAPSULE | Freq: Two times a day (BID) | ORAL | Status: DC

## 2011-11-08 NOTE — Assessment & Plan Note (Signed)
stable overall by hx and exam, most recent data reviewed with pt, and pt to continue medical treatment as before  BP Readings from Last 3 Encounters:  11/08/11 142/82  09/21/11 138/83  03/23/11 130/84   ECG reviewed as per emr

## 2011-11-08 NOTE — Assessment & Plan Note (Signed)
stable overall by hx and exam, most recent data reviewed with pt, and pt to continue medical treatment as before  Lab Results  Component Value Date   WBC 6.9 12/21/2010   HGB 14.7 12/21/2010   HCT 42.3 12/21/2010   PLT 211 12/21/2010   GLUCOSE 98 12/21/2010   CHOL 199 10/31/2010   TRIG 44.0 10/31/2010   HDL 91.50 10/31/2010   LDLDIRECT 106.4 10/13/2009   LDLCALC 99 10/31/2010   ALT 12 12/21/2010   AST 18 12/21/2010   NA 141 12/21/2010   K 4.3 12/21/2010   CL 103 12/21/2010   CREATININE 0.82 12/21/2010   BUN 15 12/21/2010   CO2 29 12/21/2010   TSH 1.69 10/31/2010

## 2011-11-08 NOTE — Assessment & Plan Note (Signed)
Etiology unclear, Exam otherwise benign, to check labs as documented, follow with expectant management  

## 2011-11-08 NOTE — Progress Notes (Signed)
Subjective:    Patient ID: Diane Haney, female    DOB: 10-12-1946, 65 y.o.   MRN: [id number]  HPI  Here for wellness and f/u;  Overall doing ok;  Pt denies CP, worsening SOB, DOE, wheezing, orthopnea, PND, worsening LE edema, palpitations, dizziness or syncope.  Pt denies neurological change such as new Headache, facial or extremity weakness.  Pt denies polydipsia, polyuria, or low sugar symptoms. Pt states overall good compliance with treatment and medications, good tolerability, and trying to follow lower cholesterol diet.  Pt denies worsening depressive symptoms, suicidal ideation or panic. No fever, wt loss, night sweats, loss of appetite, or other constitutional symptoms.  Pt states good ability with ADL's, low fall risk, home safety reviewed and adequate, no significant changes in hearing or vision, and occasionally active with exercise. Needs med refills for persistent but stable recurring insomnia with diffictuly getting to sleep.    Has seen GI recently  - just had EGD with gastritis and polyps (?gastric).  Also with recent basal cell Ca removed left upper chest wall.  Does have sense of ongoing fatigue, but denies signficant hypersomnolence.  Past Medical History  Diagnosis Date  . COPD (chronic obstructive pulmonary disease)   . Hyperlipidemia   . Hypertension   . Pulmonary Mycobacterium avium complex (MAC) infection   . Bronchiectasis   . History of shingles   . GERD (gastroesophageal reflux disease)   . Fibromyalgia   . Migraine   . Osteopenia   . Squamous cell skin cancer, multiple sites   . DVT, lower extremity     LLE   Past Surgical History  Procedure Date  . Shoulder impingement   . Foot surgery     reports that she quit smoking about 25 years ago. Her smoking use included Cigarettes. She has a 20 pack-year smoking history. She has never used smokeless tobacco. She reports that she drinks alcohol. She reports that she does not use illicit drugs. family history  includes Breast cancer in her other; Colon polyps in her father; Heart disease in her other; Hyperlipidemia in her other; Hypertension in her other; Lung cancer in her father; and Other in her other. Allergies  Allergen Reactions  . Ciprofloxacin     REACTION: tendinitis  . Clarithromycin     REACTION: severe reflux  . Prednisone     REACTION: irregular heartbeat, not able to sleep   Current Outpatient Prescriptions on File Prior to Visit  Medication Sig Dispense Refill  . Calcium Carbonate (CALCIUM 600) 1500 MG TABS Take 2 tablets by mouth daily.       . carisoprodol (SOMA) 350 MG tablet TAKE 1 TABLET BY MOUTH 2 TIMES A DAY AS NEEDED  60 tablet  4  . aspirin 81 MG EC tablet Take 81 mg by mouth daily.        . Omega-3 350 MG CAPS HOLD       Review of Systems Review of Systems  Constitutional: Negative for diaphoresis and unexpected weight change.  HENT: Negative for drooling and tinnitus.   Eyes: Negative for photophobia and visual disturbance.  Respiratory: Negative for choking and stridor.   Gastrointestinal: Negative for vomiting and blood in stool.  Genitourinary: Negative for hematuria and decreased urine volume.  Musculoskeletal: Negative for gait problem.  Skin: Negative for color change and wound.  Neurological: Negative for tremors and numbness.  Psychiatric/Behavioral: Negative for decreased concentration. The patient is not hyperactive.       Objective:   Physical  Exam BP 142/82  Pulse 86  Temp(Src) 98.3 F (36.8 C) (Oral)  Ht 5' 5.5" (1.664 m)  Wt 119 lb 2 oz (54.035 kg)  BMI 19.52 kg/m2  SpO2 93% Physical Exam  VS noted Constitutional: Pt appears well-developed and well-nourished.  HENT: Head: Normocephalic.  Right Ear: External ear normal.  Left Ear: External ear normal.  Eyes: Conjunctivae and EOM are normal. Pupils are equal, round, and reactive to light.  Neck: Normal range of motion. Neck supple.  Cardiovascular: Normal rate and regular rhythm.     Pulmonary/Chest: Effort normal and breath sounds decresaed bilat, no rales  Abd:  Soft, NT, non-distended, + BS Neurological: Pt is alert. No cranial nerve deficit.  Skin: Skin is warm. No erythema.  Psychiatric: Pt behavior is normal. Thought content normal. 1+ nervous, not depressed affect    Assessment & Plan:

## 2011-11-08 NOTE — Assessment & Plan Note (Signed)
stable overall by hx and exam, most recent data reviewed with pt, and pt to continue medical treatment as before ble  Lab Results  Component Value Date   LDLCALC 99 10/31/2010   For lipids today

## 2011-11-08 NOTE — Patient Instructions (Signed)
Continue all other medications as before Please go to LAB in the Basement for the blood and/or urine tests to be done today Please call the phone number [phone] (the [employer] System) for results of testing in 2-3 days;  When calling, simply dial the number, and when prompted enter the MRN number above (the Medical Record Number) and the # key, then the message should start. Please return in 1 year for your yearly visit, or sooner if needed 

## 2011-12-06 ENCOUNTER — Other Ambulatory Visit: Payer: Self-pay | Admitting: Internal Medicine

## 2011-12-06 NOTE — Telephone Encounter (Signed)
Faxed hardcopy to pharmacy. 

## 2011-12-11 ENCOUNTER — Ambulatory Visit (HOSPITAL_BASED_OUTPATIENT_CLINIC_OR_DEPARTMENT_OTHER)
Admission: RE | Admit: 2011-12-11 | Discharge: 2011-12-11 | Disposition: A | Discharge status: Home / Self Care | Payer: Medicare Other | Source: Ambulatory Visit | Attending: Obstetrics and Gynecology | Admitting: Obstetrics and Gynecology

## 2011-12-11 DIAGNOSIS — Z1231 Encounter for screening mammogram for malignant neoplasm of breast: Secondary | ICD-10-CM | POA: Insufficient documentation

## 2011-12-13 DIAGNOSIS — M19079 Primary osteoarthritis, unspecified ankle and foot: Secondary | ICD-10-CM | POA: Diagnosis not present

## 2011-12-13 DIAGNOSIS — M722 Plantar fascial fibromatosis: Secondary | ICD-10-CM | POA: Diagnosis not present

## 2011-12-18 DIAGNOSIS — N952 Postmenopausal atrophic vaginitis: Secondary | ICD-10-CM | POA: Diagnosis not present

## 2011-12-18 DIAGNOSIS — Z124 Encounter for screening for malignant neoplasm of cervix: Secondary | ICD-10-CM | POA: Diagnosis not present

## 2011-12-21 ENCOUNTER — Encounter: Payer: Self-pay | Admitting: Internal Medicine

## 2011-12-21 ENCOUNTER — Ambulatory Visit (INDEPENDENT_AMBULATORY_CARE_PROVIDER_SITE_OTHER): Payer: Medicare Other | Admitting: Internal Medicine

## 2011-12-21 VITALS — BP 160/92 | HR 81 | Temp 98.9°F

## 2011-12-21 DIAGNOSIS — I10 Essential (primary) hypertension: Secondary | ICD-10-CM

## 2011-12-21 MED ORDER — LOSARTAN POTASSIUM 25 MG PO TABS: 25.0000 mg | ORAL_TABLET | Freq: Every day | ORAL | Status: DC

## 2011-12-21 NOTE — Assessment & Plan Note (Signed)
Upward trend -  Will start ARB Pt to continue to keep home records and follow up 2-4 weeks BP Readings from Last 3 Encounters:  12/21/11 160/92  11/08/11 142/82  09/21/11 138/83

## 2011-12-21 NOTE — Progress Notes (Signed)
  Subjective:    Patient ID: Diane Haney, female    DOB: [birth year], 66 y.o.   MRN: [id number]  HPI complains of hypertension  Notes increasing trend - home BP and gym records reviewed Mild headache but no chest pain No edema Never on prior tx -   Past Medical History  Diagnosis Date  . COPD (chronic obstructive pulmonary disease)   . Hyperlipidemia   . Hypertension   . Pulmonary Mycobacterium avium complex (MAC) infection   . Bronchiectasis   . History of shingles   . GERD (gastroesophageal reflux disease)   . Fibromyalgia   . Migraine   . Osteopenia   . Squamous cell skin cancer, multiple sites   . DVT, lower extremity     LLE     Review of Systems  Respiratory: Negative for cough and shortness of breath.   Cardiovascular: Negative for chest pain and palpitations.       Objective:   Physical Exam BP 160/92  Pulse 81  Temp(Src) 98.9 F (37.2 C) (Oral)  SpO2 95% BP Readings from Last 3 Encounters:  12/21/11 160/92  11/08/11 142/82  09/21/11 138/83   Constitutional: She appears well-developed and well-nourished. No distress.  Neck: Normal range of motion. Neck supple. No JVD present. No thyromegaly present.  Cardiovascular: Normal rate, regular rhythm and normal heart sounds.  No murmur heard. No BLE edema. Pulmonary/Chest: Effort normal and breath sounds normal. No respiratory distress. She has no wheezes.  Psychiatric: She has a normal mood and affect. Her behavior is normal. Judgment and thought content normal.   Lab Results  Component Value Date   WBC 7.6 11/08/2011   HGB 14.5 11/08/2011   HCT 43.4 11/08/2011   PLT 231.0 11/08/2011   GLUCOSE 104* 11/08/2011   CHOL 205* 11/08/2011   TRIG 54.0 11/08/2011   HDL 98.60 11/08/2011   LDLDIRECT 89.0 11/08/2011   LDLCALC 99 10/31/2010   ALT 21 11/08/2011   AST 27 11/08/2011   NA 140 11/08/2011   K 4.3 11/08/2011   CL 102 11/08/2011   CREATININE 0.9 11/08/2011   BUN 15 11/08/2011   CO2 27 11/08/2011     TSH 1.65 11/08/2011         Assessment & Plan:  See problem list. Medications and labs reviewed today.

## 2011-12-21 NOTE — Patient Instructions (Signed)
It was good to see you today. Start low-dose generic losartan for your blood pressure - Your prescription(s) have been submitted to your pharmacy. Please take as directed and contact our office if you believe you are having problem(s) with the medication(s). Continue to monitor your home blood pressure and call if systolic top number greater than 140 or lower diastolic number greater than 90 Please schedule followup in 2-4 weeks blood pressure recheck in medication titration with Dr. Jonny Ruiz, call sooner if problems.

## 2011-12-26 ENCOUNTER — Telehealth: Payer: Self-pay

## 2011-12-26 MED ORDER — LOSARTAN POTASSIUM 100 MG PO TABS: 100.0000 mg | ORAL_TABLET | Freq: Every day | ORAL | Status: DC

## 2011-12-26 NOTE — Telephone Encounter (Signed)
Ok to incr the losartan to 100 mg per day   Continue to monitor BP daily and keep record, and vary time of day such as sometimes in the AM, sometimes at dinnertime  Will see at next OV jan 30 as planned

## 2011-12-26 NOTE — Telephone Encounter (Signed)
Patient informed of MD's instructions

## 2011-12-26 NOTE — Telephone Encounter (Signed)
Patient called as instructed by Dr. Felicity Coyer with BP readings. Yesterday was 168/98  And today was 146/98. The patient stated it never goes below 140/90 please advise

## 2011-12-27 DIAGNOSIS — M722 Plantar fascial fibromatosis: Secondary | ICD-10-CM | POA: Diagnosis not present

## 2012-01-01 ENCOUNTER — Encounter: Payer: Self-pay | Admitting: Internal Medicine

## 2012-01-01 ENCOUNTER — Ambulatory Visit (INDEPENDENT_AMBULATORY_CARE_PROVIDER_SITE_OTHER): Payer: Medicare Other | Admitting: Internal Medicine

## 2012-01-01 ENCOUNTER — Telehealth: Payer: Self-pay

## 2012-01-01 VITALS — BP 168/80 | HR 101 | Temp 98.7°F | Resp 16

## 2012-01-01 DIAGNOSIS — I10 Essential (primary) hypertension: Secondary | ICD-10-CM

## 2012-01-01 MED ORDER — NEBIVOLOL HCL 5 MG PO TABS: 5.0000 mg | ORAL_TABLET | Freq: Every day | ORAL | Status: DC

## 2012-01-01 NOTE — Patient Instructions (Signed)

## 2012-01-01 NOTE — Telephone Encounter (Signed)
Pt called stating her BP has still been elevated, lowest 140/86, highest 152/90 this morning. Pt says she was told to call back for medication adjustment. Pt also says she has been experiencing some nausea with Losartan. Please advise.

## 2012-01-01 NOTE — Progress Notes (Signed)
  Subjective:    Patient ID: Diane Haney, female    DOB: Oct 12, 1946, 66 y.o.   MRN: [id number]  Hypertension This is a recurrent problem. The current episode started more than 1 year ago. The problem has been gradually worsening since onset. The problem is uncontrolled. Associated symptoms include headaches. Pertinent negatives include no anxiety, blurred vision, chest pain, malaise/fatigue, neck pain, orthopnea, palpitations, peripheral edema, PND, shortness of breath or sweats. There are no associated agents to hypertension. Past treatments include angiotensin blockers. The current treatment provides no improvement. Compliance problems include medication side effects, exercise and diet.       Review of Systems  Constitutional: Negative for fever, chills, malaise/fatigue, diaphoresis, activity change, appetite change, fatigue and unexpected weight change.  HENT: Negative for neck pain.   Eyes: Negative.  Negative for blurred vision.  Respiratory: Negative for cough, chest tightness, shortness of breath, wheezing and stridor.   Cardiovascular: Negative for chest pain, palpitations, orthopnea, leg swelling and PND.  Gastrointestinal: Negative for nausea, vomiting, abdominal pain, diarrhea, constipation and anal bleeding.  Genitourinary: Negative.  Negative for urgency, frequency, hematuria, decreased urine volume, enuresis and difficulty urinating.  Musculoskeletal: Negative for myalgias, back pain, joint swelling, arthralgias and gait problem.  Skin: Negative for color change, pallor, rash and wound.  Neurological: Positive for headaches. Negative for dizziness, tremors, seizures, syncope, facial asymmetry, speech difficulty, weakness, light-headedness and numbness.  Hematological: Negative for adenopathy. Does not bruise/bleed easily.  Psychiatric/Behavioral: Negative.        Objective:   Physical Exam  Vitals reviewed. Constitutional: She is oriented to person, place, and time. She  appears well-developed and well-nourished. No distress.  HENT:  Head: Normocephalic and atraumatic.  Mouth/Throat: Oropharynx is clear and moist. No oropharyngeal exudate.  Eyes: Conjunctivae are normal. Right eye exhibits no discharge. Left eye exhibits no discharge. No scleral icterus.  Neck: Normal range of motion. Neck supple. No JVD present. No tracheal deviation present. No thyromegaly present.  Cardiovascular: Normal rate, regular rhythm, normal heart sounds and intact distal pulses.  Exam reveals no gallop and no friction rub.   No murmur heard. Pulmonary/Chest: Effort normal and breath sounds normal. No stridor. No respiratory distress. She has no wheezes. She has no rales. She exhibits no tenderness.  Abdominal: Soft. Bowel sounds are normal. She exhibits no distension and no mass. There is no tenderness. There is no rebound and no guarding.  Musculoskeletal: Normal range of motion. She exhibits no edema and no tenderness.  Lymphadenopathy:    She has no cervical adenopathy.  Neurological: She is oriented to person, place, and time.  Skin: Skin is warm and dry. No rash noted. She is not diaphoretic. No erythema. No pallor.  Psychiatric: She has a normal mood and affect. Her behavior is normal. Judgment and thought content normal.      Lab Results  Component Value Date   WBC 7.6 11/08/2011   HGB 14.5 11/08/2011   HCT 43.4 11/08/2011   PLT 231.0 11/08/2011   GLUCOSE 104* 11/08/2011   CHOL 205* 11/08/2011   TRIG 54.0 11/08/2011   HDL 98.60 11/08/2011   LDLDIRECT 89.0 11/08/2011   LDLCALC 99 10/31/2010   ALT 21 11/08/2011   AST 27 11/08/2011   NA 140 11/08/2011   K 4.3 11/08/2011   CL 102 11/08/2011   CREATININE 0.9 11/08/2011   BUN 15 11/08/2011   CO2 27 11/08/2011   TSH 1.65 11/08/2011      Assessment & Plan:

## 2012-01-01 NOTE — Assessment & Plan Note (Signed)
Stop losartan due to side effects and lack of efficacy and start bystolic in light of slightly elevated pulse and BP

## 2012-01-01 NOTE — Telephone Encounter (Signed)
Pt advised and scheduled for BP check

## 2012-01-01 NOTE — Telephone Encounter (Signed)
Ask her to come in today for a BP check

## 2012-01-10 ENCOUNTER — Ambulatory Visit (INDEPENDENT_AMBULATORY_CARE_PROVIDER_SITE_OTHER): Payer: Medicare Other | Admitting: Internal Medicine

## 2012-01-10 ENCOUNTER — Encounter: Payer: Self-pay | Admitting: Internal Medicine

## 2012-01-10 VITALS — BP 132/90 | HR 63 | Temp 97.1°F | Ht 65.0 in | Wt 118.0 lb

## 2012-01-10 DIAGNOSIS — I10 Essential (primary) hypertension: Secondary | ICD-10-CM

## 2012-01-10 DIAGNOSIS — R5383 Other fatigue: Secondary | ICD-10-CM

## 2012-01-10 DIAGNOSIS — E785 Hyperlipidemia, unspecified: Secondary | ICD-10-CM

## 2012-01-10 DIAGNOSIS — R5381 Other malaise: Secondary | ICD-10-CM | POA: Diagnosis not present

## 2012-01-10 MED ORDER — AMLODIPINE BESYLATE 10 MG PO TABS: 10.0000 mg | ORAL_TABLET | Freq: Every day | ORAL | Status: DC

## 2012-01-10 MED ORDER — LOSARTAN POTASSIUM 100 MG PO TABS: 100.0000 mg | ORAL_TABLET | Freq: Every day | ORAL | Status: DC

## 2012-01-10 NOTE — Patient Instructions (Signed)
OK to stop the  bystolic Please re-start the losartan 100 mg per day Please start the amlodipine 10 mg per day (generic for Norvasc) You will be contacted regarding the referral for: kidney artery ultrasound Please return in 4 weeks

## 2012-01-10 NOTE — Assessment & Plan Note (Signed)
stable overall by hx and exam, most recent data reviewed with pt, and pt to continue medical treatment as before Lab Results  Component Value Date   LDLCALC 99 10/31/2010    

## 2012-01-10 NOTE — Assessment & Plan Note (Signed)
Persistent elev despite 2 different class single med tx;  Has usually has excellent lower BP in the past, so should be screened for RAS - will order renal artery u/s;  Pt states bystolic seems to make her fatigue and HA's possibly worse, ok to d/c, re-start the losartan 100 (has 90 pills at home) and tolerated ok, but also add amlod 10 mg;  Will need to be aware of possible incr risk of LLE edema with hx of remote DVT/venous insuff worsening on the 10 mg but she is willing to try;  Goal SBP 110; pt to cont to monitor at home, f/u 4 wks

## 2012-01-10 NOTE — Assessment & Plan Note (Signed)
Etiology unclear, Exam otherwise benign,  Lab Results  Component Value Date   WBC 7.6 11/08/2011   HGB 14.5 11/08/2011   HCT 43.4 11/08/2011   PLT 231.0 11/08/2011   GLUCOSE 104* 11/08/2011   CHOL 205* 11/08/2011   TRIG 54.0 11/08/2011   HDL 98.60 11/08/2011   LDLDIRECT 89.0 11/08/2011   LDLCALC 99 10/31/2010   ALT 21 11/08/2011   AST 27 11/08/2011   NA 140 11/08/2011   K 4.3 11/08/2011   CL 102 11/08/2011   CREATININE 0.9 11/08/2011   BUN 15 11/08/2011   CO2 27 11/08/2011   TSH 1.65 11/08/2011    to f/u any worsening symptoms or concerns, to stop the bystolic

## 2012-01-10 NOTE — Progress Notes (Signed)
Subjective:    Patient ID: Diane Haney, female    DOB: May 18, 1946, 66 y.o.   MRN: [id number]  HPI  Here to f/u; overall doing ok  - Pt denies chest pain, increased sob or doe, wheezing, orthopnea, PND, increased LE swelling, palpitations, dizziness or syncope. Pt denies new neurological symptoms such as new  facial or extremity weakness or numbness, but has had increased fatigue and ? incr freq HA's on the bystolic.   Pt denies polydipsia, polyuria.  BP at home remains elev on lower dose bystolic single med tx after losartan d/c'd, avering about 140-150's it seems with daily check over the past wk.  No prior hx of RAS, and has hx of usually low BP most of her life.  Does have hx of elev chol, and hx of remote LLE DVT in her teen yrs. Trying to follow loewr chol diet. LLE currently in walking cast per podiatry to tx persistent left plantar fasciitis.   Pt denies fever, wt loss, night sweats, loss of appetite, or other constitutional symptoms Past Medical History  Diagnosis Date  . COPD (chronic obstructive pulmonary disease)   . Hyperlipidemia   . Hypertension   . Pulmonary Mycobacterium avium complex (MAC) infection   . Bronchiectasis   . History of shingles   . GERD (gastroesophageal reflux disease)   . Fibromyalgia   . Migraine   . Osteopenia   . Squamous cell skin cancer, multiple sites   . DVT, lower extremity     LLE   Past Surgical History  Procedure Date  . Shoulder impingement   . Foot surgery     reports that she quit smoking about 26 years ago. Her smoking use included Cigarettes. She has a 20 pack-year smoking history. She has never used smokeless tobacco. She reports that she does not drink alcohol or use illicit drugs. family history includes Breast cancer in her other; Colon polyps in her father; Heart disease in her other; Hyperlipidemia in her other; Hypertension in her other; Lung cancer in her father; and Other in her other. Allergies  Allergen Reactions  . Losartan      headache  . Ciprofloxacin     REACTION: tendinitis  . Clarithromycin     REACTION: severe reflux  . Prednisone     REACTION: irregular heartbeat, not able to sleep   Current Outpatient Prescriptions on File Prior to Visit  Medication Sig Dispense Refill  . aspirin 81 MG EC tablet Take 81 mg by mouth daily.        . Calcium Carbonate (CALCIUM 600) 1500 MG TABS Take 2 tablets by mouth daily.       . carisoprodol (SOMA) 350 MG tablet TAKE 1 TABLET BY MOUTH TWICE DAILY  60 tablet  1  . esomeprazole (NEXIUM) 40 MG capsule Take 1 capsule (40 mg total) by mouth 2 (two) times daily.  90 capsule  3  . simvastatin (ZOCOR) 40 MG tablet 1/2 tablet daily  90 tablet  3  . temazepam (RESTORIL) 15 MG capsule 1-2 tab by mouth at night for sleep as needed  60 capsule  5     Review of Systems Review of Systems  Constitutional: Negative for diaphoresis and unexpected weight change.  HENT: Negative for drooling and tinnitus.   Eyes: Negative for photophobia and visual disturbance.  Respiratory: Negative for choking and stridor.   Gastrointestinal: Negative for vomiting and blood in stool.  Genitourinary: Negative for hematuria and decreased urine volume.  Objective:  Physical Exam BP 132/90  Pulse 63  Temp(Src) 97.1 F (36.2 C) (Oral)  Ht 5\' 5"  (1.651 m)  Wt 118 lb (53.524 kg)  BMI 19.64 kg/m2  SpO2 98% Physical Exam  VS noted Constitutional: Pt appears well-developed and well-nourished.  HENT: Head: Normocephalic.  Right Ear: External ear normal.  Left Ear: External ear normal.  Eyes: Conjunctivae and EOM are normal. Pupils are equal, round, and reactive to light.  Neck: Normal range of motion. Neck supple.  Cardiovascular: Normal rate and regular rhythm.   Pulmonary/Chest: Effort normal and breath sounds normal.  Abd:  Soft, NT, non-distended, + BS, no bruit Neurological: Pt is alert. No cranial nerve deficit.  Skin: Skin is warm. No erythema.  Psychiatric: Pt behavior is normal.  Thought content normal.     Assessment & Plan:

## 2012-01-19 ENCOUNTER — Other Ambulatory Visit: Payer: Self-pay | Admitting: Cardiology

## 2012-01-19 DIAGNOSIS — I10 Essential (primary) hypertension: Secondary | ICD-10-CM

## 2012-01-24 ENCOUNTER — Encounter (INDEPENDENT_AMBULATORY_CARE_PROVIDER_SITE_OTHER): Payer: Medicare Other | Admitting: Cardiology

## 2012-01-24 DIAGNOSIS — I10 Essential (primary) hypertension: Secondary | ICD-10-CM | POA: Diagnosis not present

## 2012-01-31 DIAGNOSIS — L578 Other skin changes due to chronic exposure to nonionizing radiation: Secondary | ICD-10-CM | POA: Diagnosis not present

## 2012-01-31 DIAGNOSIS — Z85828 Personal history of other malignant neoplasm of skin: Secondary | ICD-10-CM | POA: Diagnosis not present

## 2012-01-31 DIAGNOSIS — L570 Actinic keratosis: Secondary | ICD-10-CM | POA: Diagnosis not present

## 2012-02-06 ENCOUNTER — Encounter: Payer: Self-pay | Admitting: Internal Medicine

## 2012-02-06 ENCOUNTER — Other Ambulatory Visit (INDEPENDENT_AMBULATORY_CARE_PROVIDER_SITE_OTHER): Payer: Medicare Other

## 2012-02-06 ENCOUNTER — Ambulatory Visit (INDEPENDENT_AMBULATORY_CARE_PROVIDER_SITE_OTHER): Payer: Medicare Other | Admitting: Internal Medicine

## 2012-02-06 VITALS — BP 122/62 | HR 88 | Temp 98.7°F | Ht 65.5 in | Wt 117.4 lb

## 2012-02-06 DIAGNOSIS — K219 Gastro-esophageal reflux disease without esophagitis: Secondary | ICD-10-CM | POA: Diagnosis not present

## 2012-02-06 DIAGNOSIS — J449 Chronic obstructive pulmonary disease, unspecified: Secondary | ICD-10-CM

## 2012-02-06 DIAGNOSIS — I10 Essential (primary) hypertension: Secondary | ICD-10-CM | POA: Diagnosis not present

## 2012-02-06 DIAGNOSIS — R609 Edema, unspecified: Secondary | ICD-10-CM | POA: Diagnosis not present

## 2012-02-06 LAB — BASIC METABOLIC PANEL
BUN: 18 mg/dL (ref 6–23)
Creatinine, Ser: 0.8 mg/dL (ref 0.4–1.2)
GFR: 80.84 mL/min (ref >60.00)
Potassium: 3.7 mEq/L (ref 3.5–5.1)

## 2012-02-06 MED ORDER — AMLODIPINE BESYLATE 5 MG PO TABS: 5.0000 mg | ORAL_TABLET | Freq: Every day | ORAL | Status: DC

## 2012-02-06 NOTE — Patient Instructions (Addendum)
Please decrease the amlodipine to 5 mg per day Continue all other medications as before Please go to LAB in the Basement for the blood and/or urine tests to be done today Please call the phone number [phone] (the [employer] System) for results of testing in 2-3 days;  When calling, simply dial the number, and when prompted enter the MRN number above (the Medical Record Number) and the # key, then the message should start. Please check your Blood Pressure at home as you do;  In 4 wks please call the last weeks values.  If the average seems > 140/90 we could consider change of losartan to generic diovan Please return in 6 month, or sooner if needed

## 2012-02-11 ENCOUNTER — Encounter: Payer: Self-pay | Admitting: Internal Medicine

## 2012-02-11 DIAGNOSIS — R609 Edema, unspecified: Secondary | ICD-10-CM | POA: Insufficient documentation

## 2012-02-11 NOTE — Assessment & Plan Note (Signed)
Mild, liely related to amlod 10 mg, should improve with decr amlodipine as above

## 2012-02-11 NOTE — Assessment & Plan Note (Signed)
stable overall by hx and exam, , and pt to continue medical treatment as before   

## 2012-02-11 NOTE — Assessment & Plan Note (Signed)
stable overall by hx and exam, most recent data reviewed with pt, and pt to continue medical treatment as before  SpO2 Readings from Last 3 Encounters:  02/06/12 99%  01/10/12 98%  01/01/12 95%

## 2012-02-11 NOTE — Progress Notes (Signed)
Subjective:    Patient ID: Diane Haney, female    DOB: 1946/11/17, 66 y.o.   MRN: [id number]  HPI  Here to f/u; overall doing ok,  Pt denies chest pain, increased sob or doe, wheezing, orthopnea, PND, increased LE swelling, palpitations, dizziness or syncope.  Pt denies new neurological symptoms such as new headache, or facial or extremity weakness or numbness   Pt denies polydipsia, polyuria, or low sugar symptoms such as weakness or confusion improved with po intake.  Pt states overall good compliance with meds, trying to follow lower cholesterol diet, wt overall stable but little exercise however.  unofrtunately as new leg edema bilat on the amlodipine 10 mg.  Overall good compliance with treatment, and good medicine tolerability.   Pt denies fever, wt loss, night sweats, loss of appetite, or other constitutional symptoms. Denies worsening reflux, dysphagia, abd pain, n/v, bowel change or blood.  Past Medical History  Diagnosis Date  . COPD (chronic obstructive pulmonary disease)   . Hyperlipidemia   . Hypertension   . Pulmonary Mycobacterium avium complex (MAC) infection   . Bronchiectasis   . History of shingles   . GERD (gastroesophageal reflux disease)   . Fibromyalgia   . Migraine   . Osteopenia   . Squamous cell skin cancer, multiple sites   . DVT, lower extremity     LLE   Past Surgical History  Procedure Date  . Shoulder impingement   . Foot surgery     reports that she quit smoking about 26 years ago. Her smoking use included Cigarettes. She has a 20 pack-year smoking history. She has never used smokeless tobacco. She reports that she does not drink alcohol or use illicit drugs. family history includes Breast cancer in her other; Colon polyps in her father; Heart disease in her other; Hyperlipidemia in her other; Hypertension in her other; Lung cancer in her father; and Other in her other. Allergies  Allergen Reactions  . Losartan     headache  . Ciprofloxacin    REACTION: tendinitis  . Clarithromycin     REACTION: severe reflux  . Prednisone     REACTION: irregular heartbeat, not able to sleep   Current Outpatient Prescriptions on File Prior to Visit  Medication Sig Dispense Refill  . aspirin 81 MG EC tablet Take 81 mg by mouth daily.        . Calcium Carbonate (CALCIUM 600) 1500 MG TABS Take 2 tablets by mouth daily.       . carisoprodol (SOMA) 350 MG tablet TAKE 1 TABLET BY MOUTH TWICE DAILY  60 tablet  1  . esomeprazole (NEXIUM) 40 MG capsule Take 1 capsule (40 mg total) by mouth 2 (two) times daily.  90 capsule  3  . losartan (COZAAR) 100 MG tablet Take 1 tablet (100 mg total) by mouth daily.  90 tablet  3  . temazepam (RESTORIL) 15 MG capsule 1-2 tab by mouth at night for sleep as needed  60 capsule  5  . simvastatin (ZOCOR) 40 MG tablet 1/2 tablet daily  90 tablet  3   Review of Systems Review of Systems  Constitutional: Negative for diaphoresis and unexpected weight change.  HENT: Negative for drooling and tinnitus.   Eyes: Negative for photophobia and visual disturbance.  Respiratory: Negative for choking and stridor.   Gastrointestinal: Negative for vomiting and blood in stool.  Genitourinary: Negative for hematuria and decreased urine volume.      Objective:   Physical Exam BP  122/62  Pulse 88  Temp(Src) 98.7 F (37.1 C) (Oral)  Ht 5' 5.5" (1.664 m)  Wt 117 lb 6 oz (53.241 kg)  BMI 19.24 kg/m2  SpO2 99% Physical Exam  VS noted Constitutional: Pt appears well-developed and well-nourished.  HENT: Head: Normocephalic.  Right Ear: External ear normal.  Left Ear: External ear normal.  Eyes: Conjunctivae and EOM are normal. Pupils are equal, round, and reactive to light.  Neck: Normal range of motion. Neck supple.  Cardiovascular: Normal rate and regular rhythm.   Pulmonary/Chest: Effort normal and breath sounds normal.  Abd:  Soft, NT, non-distended, + BS Neurological: Pt is alert. No cranial nerve deficit.  Skin: Skin is  warm. No erythema. LE with 1+ edema bilat to knees, no ulcers or erythema Psychiatric: Pt behavior is normal. Thought content normal.     Assessment & Plan:

## 2012-02-11 NOTE — Assessment & Plan Note (Signed)
Ok to decrease the amlod to 10 mg to help reduce edema, cont to monitor BP at home and next visit,  Consider change losartan to diovan if not well controlled, check BMET

## 2012-02-12 ENCOUNTER — Telehealth: Payer: Self-pay

## 2012-02-12 MED ORDER — VALSARTAN 320 MG PO TABS: 320.0000 mg | ORAL_TABLET | Freq: Every day | ORAL | Status: DC

## 2012-02-12 NOTE — Telephone Encounter (Signed)
Patient reduced BP Amlodipine 5 mg, but BP increased to 148/94. The patient went back to regular dose of 10 mg amlodipine, ankles and feet are still swollen. [facility] was 130/74 please advise

## 2012-02-12 NOTE — Telephone Encounter (Signed)
Ok to cont the amlodipine at 5 mg, BUT d/c the losartan, and start the Diovan 320 mg which is More effective for BP, and "tends" to help better with getting swelling out of the legs  Done per emr

## 2012-02-13 NOTE — Telephone Encounter (Signed)
Called informed the patients husband of medication instructions and changes.

## 2012-03-04 ENCOUNTER — Other Ambulatory Visit: Payer: Self-pay | Admitting: Internal Medicine

## 2012-03-04 NOTE — Telephone Encounter (Signed)
Done hardcopy to robin  

## 2012-03-04 NOTE — Telephone Encounter (Signed)
Faxed hardcopy to pharmacy. 

## 2012-03-13 ENCOUNTER — Telehealth: Payer: Self-pay

## 2012-03-13 NOTE — Telephone Encounter (Signed)
Patient informed of MD's instructions

## 2012-03-13 NOTE — Telephone Encounter (Signed)
The patient called with BP as instructed at last OV. In the AM average has been 122/70 and PM average 144/90, please advise call back number is [phone]

## 2012-03-13 NOTE — Telephone Encounter (Signed)
I think ok to cont meds as is, no change  Can address further at next OV  Pt to cont to monitor BP as she does, but no need more than 2-3 times per wk

## 2012-03-18 DIAGNOSIS — E876 Hypokalemia: Secondary | ICD-10-CM | POA: Diagnosis not present

## 2012-03-18 DIAGNOSIS — J449 Chronic obstructive pulmonary disease, unspecified: Secondary | ICD-10-CM | POA: Diagnosis not present

## 2012-03-18 DIAGNOSIS — K219 Gastro-esophageal reflux disease without esophagitis: Secondary | ICD-10-CM | POA: Diagnosis not present

## 2012-03-18 DIAGNOSIS — R079 Chest pain, unspecified: Secondary | ICD-10-CM | POA: Diagnosis not present

## 2012-03-18 DIAGNOSIS — Z8249 Family history of ischemic heart disease and other diseases of the circulatory system: Secondary | ICD-10-CM | POA: Diagnosis not present

## 2012-03-18 DIAGNOSIS — J984 Other disorders of lung: Secondary | ICD-10-CM | POA: Diagnosis not present

## 2012-03-18 DIAGNOSIS — R0789 Other chest pain: Secondary | ICD-10-CM | POA: Diagnosis not present

## 2012-03-18 DIAGNOSIS — I10 Essential (primary) hypertension: Secondary | ICD-10-CM | POA: Diagnosis not present

## 2012-03-18 DIAGNOSIS — IMO0001 Reserved for inherently not codable concepts without codable children: Secondary | ICD-10-CM | POA: Diagnosis not present

## 2012-03-19 DIAGNOSIS — I10 Essential (primary) hypertension: Secondary | ICD-10-CM | POA: Diagnosis not present

## 2012-03-19 DIAGNOSIS — K219 Gastro-esophageal reflux disease without esophagitis: Secondary | ICD-10-CM | POA: Diagnosis not present

## 2012-03-19 DIAGNOSIS — R0789 Other chest pain: Secondary | ICD-10-CM | POA: Diagnosis not present

## 2012-03-19 DIAGNOSIS — E876 Hypokalemia: Secondary | ICD-10-CM | POA: Diagnosis not present

## 2012-03-25 ENCOUNTER — Ambulatory Visit (INDEPENDENT_AMBULATORY_CARE_PROVIDER_SITE_OTHER): Payer: Medicare Other | Admitting: Critical Care Medicine

## 2012-03-25 ENCOUNTER — Encounter: Payer: Self-pay | Admitting: Critical Care Medicine

## 2012-03-25 VITALS — BP 132/70 | HR 84 | Temp 98.1°F | Ht 65.5 in | Wt 115.0 lb

## 2012-03-25 DIAGNOSIS — A310 Pulmonary mycobacterial infection: Secondary | ICD-10-CM

## 2012-03-25 NOTE — Patient Instructions (Signed)
No change in medications. Return in          6 months or sooner as needed

## 2012-03-25 NOTE — Assessment & Plan Note (Signed)
Bronchiectasis with MAI now stable.  Pt now off three drug RX Fob 2012 Neg for AFB on culture CT chest 03/18/12: NAD, copd changes. No acute or chronic infiltrates  Now stable bronchiectasis, no evidence for active MAC infection Plan No indication for inhaler therapy No indication for further ABX Return 6 months

## 2012-03-25 NOTE — Progress Notes (Signed)
Subjective:    Patient ID: Diane Haney, female    DOB: [birth year], 66 y.o.   MRN: [id number]  HPI  66 y.o.   with hx cough. Was thought to have scar tissue on CXR but then later felt to have bronchiectasis (abnormal CT scans as well). Had bronchoscopy in feb 2005 showing AFB but cx did not grow. then had endoscopy in May 2005 which grew MAI (S- clarithro, eth, RIF 8.0, synergy positive for E/R) and fortuitum (S- cipro/smikacin/tigecycline).  began antibiotics may 2005 and was treated with ETH/Azithro and cipro. she continued on anbiotics until March 2007. Did well until July 2007 when she had a CT scan which showed some "activation" of MAI.  She was restarted on her MAI therapy in Nov 2008. Last CT scan was 7-09 " 1. Stable mild lingular bronchiectasis. Stable tiny associated left lung nodules, which are consistent with a postinflammatory  etiology. 2. No active disease."  Last seen in ID November of 2009 and was taken off meds for MAI, was doing well. Today feels like her "MAC is back". More coughing, no fevers, no chills, SOB has been at baseline. Cough is non-productive but she feels like there is sputum there. wt steady. Has some chest discomfort, tightness with breathing deep. she relates this to her fibromyalgia. lymphadenopathy   03/25/2012  Pt went to ED one week ago ,  CXR and CT scan neg for PE. Pt had acute onset chest pain.  Chest pain worse with a deep breath.  No change in cough.  Notes chills regularly. Dyspnea sl worse since 10/12.  If lay flat at night and tries to sleep is dyspneic.  Worse up hill .     No real wheeze.  No real edema in feet.  Notes sl edema by evening. Notes HTN now.    Past Medical History  Diagnosis Date  . COPD (chronic obstructive pulmonary disease)   . Hyperlipidemia   . Hypertension   . Pulmonary Mycobacterium avium complex (MAC) infection   . Bronchiectasis   . History of shingles   . GERD (gastroesophageal reflux disease)   . Fibromyalgia   .  Migraine   . Osteopenia   . Squamous cell skin cancer, multiple sites   . DVT, lower extremity     LLE     Family History  Problem Relation Age of Onset  . Lung cancer Father   . Heart disease Other     grandparents  . Breast cancer Other   . Hyperlipidemia Other   . Hypertension Other   . Other Other     alcholism / addiction  . Colon polyps Father      History   Social History  . Marital Status: Married    Spouse Name: N/A    Number of Children: 3  . Years of Education: N/A   Occupational History  . previously part-time MD's office    Social History Main Topics  . Smoking status: Former Smoker -- 1.0 packs/day for 25 years    Types: Cigarettes    Quit date: 12/11/1985  . Smokeless tobacco: Never Used  . Alcohol Use: No     occasional  . Drug Use: No  . Sexually Active: Not Currently   Other Topics Concern  . Not on file   Social History Narrative   Has not worked since apprx 1990     Allergies  Allergen Reactions  . Losartan     headache  . Ciprofloxacin  REACTION: tendinitis  . Clarithromycin     REACTION: severe reflux  . Prednisone     REACTION: irregular heartbeat, not able to sleep     Outpatient Prescriptions Prior to Visit  Medication Sig Dispense Refill  . amLODipine (NORVASC) 5 MG tablet Take 1 tablet (5 mg total) by mouth daily.  90 tablet  3  . aspirin 81 MG EC tablet Take 81 mg by mouth daily.        . Calcium Carbonate (CALCIUM 600) 1500 MG TABS Take 2 tablets by mouth daily.       . carisoprodol (SOMA) 350 MG tablet Take 1 tablet (350 mg total) by mouth 2 (two) times daily as needed for muscle spasms.  60 tablet  1  . esomeprazole (NEXIUM) 40 MG capsule Take 1 capsule (40 mg total) by mouth 2 (two) times daily.  90 capsule  3  . simvastatin (ZOCOR) 40 MG tablet 1/2 tablet daily  90 tablet  3  . temazepam (RESTORIL) 15 MG capsule 1-2 tab by mouth at night for sleep as needed  60 capsule  5  . valsartan (DIOVAN) 320 MG tablet Take  1 tablet (320 mg total) by mouth daily.  90 tablet  3     Review of Systems  Constitutional:   No  weight loss, night sweats,  Fevers, occ  Chills,has  fatigue, lassitude. HEENT:   No headaches,  Difficulty swallowing,  Tooth/dental problems,  Sore throat,                No sneezing, itching, ear ache, nasal congestion, occ  post nasal drip,   CV:  No chest pain,  Orthopnea, PND, swelling in lower extremities, anasarca, dizziness, palpitations  GI  No heartburn, indigestion, abdominal pain, nausea, vomiting, diarrhea, change in bowel habits, loss of appetite  Resp: Notes  shortness of breath with exertion not at rest.  No excess mucus, no productive cough,  Notes a  non-productive cough,  No coughing up of blood.  No change in color of mucus.  No wheezing.  No chest wall deformity  Skin: no rash or lesions.  GU: no dysuria, change in color of urine, no urgency or frequency.  No flank pain.  MS:  No joint pain or swelling.  No decreased range of motion.  No back pain.  Psych:  No change in mood or affect. No depression or anxiety.  No memory loss.     Objective:   Physical Exam BP 132/70  Pulse 84  Temp(Src) 98.1 F (36.7 C) (Oral)  Ht 5' 5.5" (1.664 m)  Wt 115 lb (52.164 kg)  BMI 18.85 kg/m2  SpO2 100%  Gen: Pleasant, well-nourished, in no distress,  normal affect  ENT: No lesions,  mouth clear,  oropharynx clear, no postnasal drip  Neck: No JVD, no TMG, no carotid bruits  Lungs: No use of accessory muscles, no dullness to percussion, distant BS  Cardiovascular: RRR, heart sounds normal, no murmur or gallops, no peripheral edema  Abdomen: soft and NT, no HSM,  BS normal  Musculoskeletal: No deformities, no cyanosis or clubbing  Neuro: alert, non focal  Skin: Warm, no lesions or rashes      PFT Conversion 11/24/2010  FVC 2.99  FVC PREDICT 3.06  FVC  % Predicted 97  FEV1 2.26  FEV1 PREDICT 2.23  FEV % Predicted 101  FEV1/FVC 75.6  FEV1/FVC PRE 72  FeF  25-75 1.87  FeF 25-75 % Predicted 2.5  FEF % EXPEC  74    Assessment & Plan:   Pulmonary diseases due to other mycobacteria Bronchiectasis with MAI now stable.  Pt now off three drug RX Fob 2012 Neg for AFB on culture CT chest 03/18/12: NAD, copd changes. No acute or chronic infiltrates  Now stable bronchiectasis, no evidence for active MAC infection Plan No indication for inhaler therapy No indication for further ABX Return 6 months     Updated Medication List Outpatient Encounter Prescriptions as of 03/25/2012  Medication Sig Dispense Refill  . amLODipine (NORVASC) 5 MG tablet Take 1 tablet (5 mg total) by mouth daily.  90 tablet  3  . aspirin 81 MG EC tablet Take 81 mg by mouth daily.        . Calcium Carbonate (CALCIUM 600) 1500 MG TABS Take 2 tablets by mouth daily.       . calcium citrate-vitamin D (CITRACAL+D) 315-200 MG-UNIT per tablet Take 1 tablet by mouth 2 (two) times daily.      . carisoprodol (SOMA) 350 MG tablet Take 1 tablet (350 mg total) by mouth 2 (two) times daily as needed for muscle spasms.  60 tablet  1  . esomeprazole (NEXIUM) 40 MG capsule Take 1 capsule (40 mg total) by mouth 2 (two) times daily.  90 capsule  3  . PREMARIN vaginal cream Once a week.      . simvastatin (ZOCOR) 40 MG tablet 1/2 tablet daily  90 tablet  3  . temazepam (RESTORIL) 15 MG capsule 1-2 tab by mouth at night for sleep as needed  60 capsule  5  . valsartan (DIOVAN) 320 MG tablet Take 1 tablet (320 mg total) by mouth daily.  90 tablet  3

## 2012-06-05 ENCOUNTER — Other Ambulatory Visit: Payer: Self-pay | Admitting: Internal Medicine

## 2012-06-05 MED ORDER — ESOMEPRAZOLE MAGNESIUM 40 MG PO CPDR: 40.0000 mg | DELAYED_RELEASE_CAPSULE | Freq: Two times a day (BID) | ORAL | Status: DC

## 2012-06-11 ENCOUNTER — Encounter: Payer: Self-pay | Admitting: Internal Medicine

## 2012-06-11 ENCOUNTER — Ambulatory Visit (INDEPENDENT_AMBULATORY_CARE_PROVIDER_SITE_OTHER): Payer: Medicare Other | Admitting: Internal Medicine

## 2012-06-11 VITALS — BP 122/70 | HR 72 | Temp 98.0°F | Ht 65.5 in | Wt 115.0 lb

## 2012-06-11 DIAGNOSIS — R109 Unspecified abdominal pain: Secondary | ICD-10-CM

## 2012-06-11 DIAGNOSIS — R609 Edema, unspecified: Secondary | ICD-10-CM | POA: Diagnosis not present

## 2012-06-11 DIAGNOSIS — J449 Chronic obstructive pulmonary disease, unspecified: Secondary | ICD-10-CM | POA: Diagnosis not present

## 2012-06-11 DIAGNOSIS — I10 Essential (primary) hypertension: Secondary | ICD-10-CM | POA: Diagnosis not present

## 2012-06-11 MED ORDER — SUCRALFATE 1 GM/10ML PO SUSP: 1.0000 g | Freq: Four times a day (QID) | ORAL | Status: DC

## 2012-06-11 NOTE — Patient Instructions (Addendum)
Please try the Dexilant at 60 mg per day instead of the nexium to see if this helps Take all new medications as prescribed  - the carafate Continue all other medications as before OK to cancel the appt in August Please return in 6 months, or sooner if needed

## 2012-06-13 ENCOUNTER — Encounter: Payer: Self-pay | Admitting: Internal Medicine

## 2012-06-13 DIAGNOSIS — R109 Unspecified abdominal pain: Secondary | ICD-10-CM | POA: Insufficient documentation

## 2012-06-13 NOTE — Assessment & Plan Note (Signed)
stable overall by hx and exam, most recent data reviewed with pt, and pt to continue medical treatment as before SpO2 Readings from Last 3 Encounters:  06/11/12 98%  03/25/12 100%  02/06/12 99%

## 2012-06-13 NOTE — Progress Notes (Signed)
Subjective:    Patient ID: Diane Haney, female    DOB: 1946-03-13, 66 y.o.   MRN: [id number]  HPI here with c/o 1-2 wks onset mild mid and upper abd dyspeptic type discomfort and nausea with ? Mild worsening reflux, nothing makes better or worse,  nexium not working as well it seems;  Has seen [facility], Dr Lanae Boast with similar prior symtpoms, 2012 EGD with gastritis and polyps, U/s neg for GB problem, symptoms today somewhat similar per pt.  Carafate for several wks seemed to help then.  Denies worsening dysphagia, bowel change or blood.  Pt denies chest pain, increased sob or doe, wheezing, orthopnea, PND, increased LE swelling, palpitations, dizziness or syncope.   Pt denies polydipsia, polyuria.  Pt denies new neurological symptoms such as new headache, or facial or extremity weakness or numbness   Past Medical History  Diagnosis Date  . COPD (chronic obstructive pulmonary disease)   . Hyperlipidemia   . Hypertension   . Pulmonary Mycobacterium avium complex (MAC) infection   . Bronchiectasis   . History of shingles   . GERD (gastroesophageal reflux disease)   . Fibromyalgia   . Migraine   . Osteopenia   . Squamous cell skin cancer, multiple sites   . DVT, lower extremity     LLE   Past Surgical History  Procedure Date  . Shoulder impingement   . Foot surgery     reports that she quit smoking about 26 years ago. Her smoking use included Cigarettes. She has a 25 pack-year smoking history. She has never used smokeless tobacco. She reports that she does not drink alcohol or use illicit drugs. family history includes Breast cancer in her other; Colon polyps in her father; Heart disease in her other; Hyperlipidemia in her other; Hypertension in her other; Lung cancer in her father; and Other in her other. Allergies  Allergen Reactions  . Losartan     headache  . Ciprofloxacin     REACTION: tendinitis  . Clarithromycin     REACTION: severe reflux  . Prednisone     REACTION:  irregular heartbeat, not able to sleep   Current Outpatient Prescriptions on File Prior to Visit  Medication Sig Dispense Refill  . amLODipine (NORVASC) 5 MG tablet Take 1 tablet (5 mg total) by mouth daily.  90 tablet  3  . aspirin 81 MG EC tablet Take 81 mg by mouth daily.        . Calcium Carbonate (CALCIUM 600) 1500 MG TABS Take 2 tablets by mouth daily.       . calcium citrate-vitamin D (CITRACAL+D) 315-200 MG-UNIT per tablet Take 1 tablet by mouth 2 (two) times daily.      . carisoprodol (SOMA) 350 MG tablet Take 1 tablet (350 mg total) by mouth 2 (two) times daily as needed for muscle spasms.  60 tablet  1  . esomeprazole (NEXIUM) 40 MG capsule Take 1 capsule (40 mg total) by mouth 2 (two) times daily.  180 capsule  3  . PREMARIN vaginal cream Once a week.      . simvastatin (ZOCOR) 40 MG tablet 1/2 tablet daily  90 tablet  3  . temazepam (RESTORIL) 15 MG capsule 1-2 tab by mouth at night for sleep as needed  60 capsule  5  . valsartan (DIOVAN) 320 MG tablet Take 1 tablet (320 mg total) by mouth daily.  90 tablet  3  . sucralfate (CARAFATE) 1 GM/10ML suspension Take 10 mLs (1  g total) by mouth 4 (four) times daily.  420 mL  2   Review of Systems Review of Systems  Constitutional: Negative for diaphoresis and unexpected weight change.  HENT: Negative for drooling and tinnitus.   Eyes: Negative for photophobia and visual disturbance.  Respiratory: Negative for choking and stridor.   Gastrointestinal: Negative for vomiting and blood in stool.  Genitourinary: Negative for hematuria and decreased urine volume.  Musculoskeletal: Negative for gait problem.  Skin: Negative for color change and wound.  Neurological: Negative for tremors and numbness.  Psychiatric/Behavioral: Negative for decreased concentration. The patient is not hyperactive.      Objective:   Physical Exam BP 122/70  Pulse 72  Temp 98 F (36.7 C) (Oral)  Ht 5' 5.5" (1.664 m)  Wt 115 lb (52.164 kg)  BMI 18.85  kg/m2  SpO2 98% Physical Exam  VS noted, not ill appearing Constitutional: Pt appears well-developed and well-nourished.  HENT: Head: Normocephalic.  Right Ear: External ear normal.  Left Ear: External ear normal.  Eyes: Conjunctivae and EOM are normal. Pupils are equal, round, and reactive to light.  Neck: Normal range of motion. Neck supple.  Cardiovascular: Normal rate and regular rhythm.   Pulmonary/Chest: Effort normal and breath sounds normal.  Abd:  Soft, NT, non-distended, + BS, no guarding or rebound, benign Neurological: Pt is alert. Skin: Skin is warm. No erythema.  No LE edema Psychiatric: Pt behavior is normal. Thought content normal.1+ nervous     Assessment & Plan:

## 2012-06-13 NOTE — Assessment & Plan Note (Signed)
Resolved, stable overall by hx and exam, and pt to continue medical treatment as before

## 2012-06-13 NOTE — Assessment & Plan Note (Signed)
stable overall by hx and exam, most recent data reviewed with pt, and pt to continue medical treatment as before BP Readings from Last 3 Encounters:  06/11/12 122/70  03/25/12 132/70  02/06/12 122/62

## 2012-06-13 NOTE — Assessment & Plan Note (Signed)
Unclear etiology, for trial dexilant 60 mg, and add carafate as well, consider f/u with GI if persists or worsens

## 2012-07-23 DIAGNOSIS — K219 Gastro-esophageal reflux disease without esophagitis: Secondary | ICD-10-CM | POA: Diagnosis not present

## 2012-07-23 DIAGNOSIS — R1013 Epigastric pain: Secondary | ICD-10-CM | POA: Diagnosis not present

## 2012-07-29 ENCOUNTER — Telehealth: Payer: Self-pay | Admitting: Internal Medicine

## 2012-07-29 MED ORDER — IRBESARTAN 300 MG PO TABS: 300.0000 mg | ORAL_TABLET | Freq: Every day | ORAL | Status: DC

## 2012-07-29 NOTE — Telephone Encounter (Signed)
The pt called hoping to get her Diovan changed to the generic Avalpro (spelling?) Her callback is [phone].   Thanks!

## 2012-07-29 NOTE — Telephone Encounter (Signed)
Done erx 

## 2012-07-29 NOTE — Telephone Encounter (Signed)
Patient informed. 

## 2012-07-30 ENCOUNTER — Other Ambulatory Visit: Payer: Self-pay | Admitting: Internal Medicine

## 2012-07-30 NOTE — Telephone Encounter (Signed)
Faxed hardcopy to pharmacy. 

## 2012-07-30 NOTE — Telephone Encounter (Signed)
Done hardcopy to robin  

## 2012-08-06 ENCOUNTER — Ambulatory Visit: Payer: Medicare Other | Admitting: Internal Medicine

## 2012-09-05 ENCOUNTER — Ambulatory Visit (INDEPENDENT_AMBULATORY_CARE_PROVIDER_SITE_OTHER): Payer: Medicare Other | Admitting: Critical Care Medicine

## 2012-09-05 ENCOUNTER — Encounter: Payer: Self-pay | Admitting: Critical Care Medicine

## 2012-09-05 VITALS — BP 122/68 | HR 94 | Temp 98.1°F | Ht 65.5 in | Wt 114.0 lb

## 2012-09-05 DIAGNOSIS — A310 Pulmonary mycobacterial infection: Secondary | ICD-10-CM | POA: Diagnosis not present

## 2012-09-05 NOTE — Patient Instructions (Addendum)
No change in medications. Return in            12 months You deferred the flu vaccine

## 2012-09-05 NOTE — Assessment & Plan Note (Signed)
Bronchiectasis due to Mycobacterium avium intracellulare infection stable at this time Patient's currently off anti-microbacterial therapy Plan Note patient deferred flu vaccine Maintain off  Mycobacterial ABX Rov 1 yr or prn

## 2012-09-05 NOTE — Progress Notes (Signed)
Subjective:    Patient ID: Diane Haney, female    DOB: [birth year], 66 y.o.   MRN: [id number]  HPI  66 y.o.   with hx cough. Was thought to have scar tissue on CXR but then later felt to have bronchiectasis (abnormal CT scans as well). Had bronchoscopy in feb 2005 showing AFB but cx did not grow. then had endoscopy in May 2005 which grew MAI (S- clarithro, eth, RIF 8.0, synergy positive for E/R) and fortuitum (S- cipro/smikacin/tigecycline).  began antibiotics may 2005 and was treated with ETH/Azithro and cipro. she continued on anbiotics until March 2007. Did well until July 2007 when she had a CT scan which showed some "activation" of MAI.  She was restarted on her MAI therapy in Nov 2008. Last CT scan was 7-09 " 1. Stable mild lingular bronchiectasis. Stable tiny associated left lung nodules, which are consistent with a postinflammatory  etiology. 2. No active disease."  Last seen in ID November of 2009 and was taken off meds for MAI, was doing well. Today feels like her "MAC is back". More coughing, no fevers, no chills, SOB has been at baseline. Cough is non-productive but she feels like there is sputum there. wt steady. Has some chest discomfort, tightness with breathing deep. she relates this to her fibromyalgia. lymphadenopathy   03/25/2012  Pt went to ED one week ago ,  CXR and CT scan neg for PE. Pt had acute onset chest pain.  Chest pain worse with a deep breath.  No change in cough.  Notes chills regularly. Dyspnea sl worse since 10/12.  If lay flat at night and tries to sleep is dyspneic.  Worse up hill .     No real wheeze.  No real edema in feet.  Notes sl edema by evening. Notes HTN now.    09/05/2012 Since last OV:  PPI changed to dexilant.  Pt with chills, feels cold all the time.  No sweats.  No dyspnea unless up steps or up incline.  No chest pain now.  Dry cough.  No mucus.  No edema in feet.  No real fever. Pt denies any significant sore throat, nasal congestion or excess  secretions, fever, chills, sweats, unintended weight loss, pleurtic or exertional chest pain, orthopnea PND, or leg swelling Pt denies any increase in rescue therapy over baseline, denies waking up needing it or having any early am or nocturnal exacerbations of coughing/wheezing/or dyspnea. Pt also denies any obvious fluctuation in symptoms with  weather or environmental change or other alleviating or aggravating factors   Past Medical History  Diagnosis Date  . COPD (chronic obstructive pulmonary disease)   . Hyperlipidemia   . Hypertension   . Pulmonary Mycobacterium avium complex (MAC) infection   . Bronchiectasis   . History of shingles   . GERD (gastroesophageal reflux disease)   . Fibromyalgia   . Migraine   . Osteopenia   . Squamous cell skin cancer, multiple sites   . DVT, lower extremity     LLE     Family History  Problem Relation Age of Onset  . Lung cancer Father   . Heart disease Other     grandparents  . Breast cancer Other   . Hyperlipidemia Other   . Hypertension Other   . Other Other     alcholism / addiction  . Colon polyps Father      History   Social History  . Marital Status: Married    Spouse Name: N/A  Number of Children: 3  . Years of Education: N/A   Occupational History  . previously part-time MD's office    Social History Main Topics  . Smoking status: Former Smoker -- 1.0 packs/day for 25 years    Types: Cigarettes    Quit date: 12/11/1985  . Smokeless tobacco: Never Used  . Alcohol Use: No     occasional  . Drug Use: No  . Sexually Active: Not Currently   Other Topics Concern  . Not on file   Social History Narrative   Has not worked since apprx 1990     Allergies  Allergen Reactions  . Losartan     headache  . Ciprofloxacin     REACTION: tendinitis  . Clarithromycin     REACTION: severe reflux  . Prednisone     REACTION: irregular heartbeat, not able to sleep     Outpatient Prescriptions Prior to Visit    Medication Sig Dispense Refill  . amLODipine (NORVASC) 5 MG tablet Take 1 tablet (5 mg total) by mouth daily.  90 tablet  3  . aspirin 81 MG EC tablet Take 81 mg by mouth daily.        . calcium citrate-vitamin D (CITRACAL+D) 315-200 MG-UNIT per tablet Take 1 tablet by mouth 2 (two) times daily.      . carisoprodol (SOMA) 350 MG tablet TAKE 1 TABLET BY MOUTH TWICE DAILY AS NEEDED FOR MUSCLE SPASMS  60 tablet  1  . irbesartan (AVAPRO) 300 MG tablet Take 1 tablet (300 mg total) by mouth daily.  90 tablet  3  . PREMARIN vaginal cream Once a week.      . simvastatin (ZOCOR) 40 MG tablet 1/2 tablet daily  90 tablet  3  . temazepam (RESTORIL) 15 MG capsule 1-2 tab by mouth at night for sleep as needed  60 capsule  5  . Calcium Carbonate (CALCIUM 600) 1500 MG TABS Take 2 tablets by mouth daily.       Marland Kitchen esomeprazole (NEXIUM) 40 MG capsule Take 1 capsule (40 mg total) by mouth 2 (two) times daily.  180 capsule  3  . sucralfate (CARAFATE) 1 GM/10ML suspension Take 10 mLs (1 g total) by mouth 4 (four) times daily.  420 mL  2     Review of Systems  Constitutional:   No  weight loss, night sweats,  Fevers, occ  Chills,has  fatigue, lassitude. HEENT:   No headaches,  Difficulty swallowing,  Tooth/dental problems,  Sore throat,                No sneezing, itching, ear ache, nasal congestion, occ  post nasal drip,   CV:  No chest pain,  Orthopnea, PND, swelling in lower extremities, anasarca, dizziness, palpitations  GI  No heartburn, indigestion, abdominal pain, nausea, vomiting, diarrhea, change in bowel habits, loss of appetite  Resp: Notes  shortness of breath with exertion not at rest.  No excess mucus, no productive cough,  Notes a  non-productive cough,  No coughing up of blood.  No change in color of mucus.  No wheezing.  No chest wall deformity  Skin: no rash or lesions.  GU: no dysuria, change in color of urine, no urgency or frequency.  No flank pain.  MS:  No joint pain or swelling.  No  decreased range of motion.  No back pain.  Psych:  No change in mood or affect. No depression or anxiety.  No memory loss.  Objective:   Physical Exam BP 122/68  Pulse 94  Temp 98.1 F (36.7 C) (Oral)  Ht 5' 5.5" (1.664 m)  Wt 114 lb (51.71 kg)  BMI 18.68 kg/m2  SpO2 99%  Gen: Pleasant, well-nourished, in no distress,  normal affect  ENT: No lesions,  mouth clear,  oropharynx clear, no postnasal drip  Neck: No JVD, no TMG, no carotid bruits  Lungs: No use of accessory muscles, no dullness to percussion, distant BS  Cardiovascular: RRR, heart sounds normal, no murmur or gallops, no peripheral edema  Abdomen: soft and NT, no HSM,  BS normal  Musculoskeletal: No deformities, no cyanosis or clubbing  Neuro: alert, non focal  Skin: Warm, no lesions or rashes      PFT Conversion 11/24/2010  FVC 2.99  FVC PREDICT 3.06  FVC  % Predicted 97  FEV1 2.26  FEV1 PREDICT 2.23  FEV % Predicted 101  FEV1/FVC 75.6  FEV1/FVC PRE 72  FeF 25-75 1.87  FeF 25-75 % Predicted 2.5  FEF % EXPEC 74    Assessment & Plan:   Pulmonary diseases due to other mycobacteria Bronchiectasis due to Mycobacterium avium intracellulare infection stable at this time Patient's currently off anti-microbacterial therapy Plan Note patient deferred flu vaccine Maintain off  Mycobacterial ABX Rov 1 yr or prn     Updated Medication List Outpatient Encounter Prescriptions as of 09/05/2012  Medication Sig Dispense Refill  . amLODipine (NORVASC) 5 MG tablet Take 1 tablet (5 mg total) by mouth daily.  90 tablet  3  . aspirin 81 MG EC tablet Take 81 mg by mouth daily.        . calcium citrate-vitamin D (CITRACAL+D) 315-200 MG-UNIT per tablet Take 1 tablet by mouth 2 (two) times daily.      . carisoprodol (SOMA) 350 MG tablet TAKE 1 TABLET BY MOUTH TWICE DAILY AS NEEDED FOR MUSCLE SPASMS  60 tablet  1  . DEXILANT 60 MG capsule Take 1 capsule by mouth daily.      . irbesartan (AVAPRO) 300 MG tablet  Take 1 tablet (300 mg total) by mouth daily.  90 tablet  3  . PREMARIN vaginal cream Once a week.      . simvastatin (ZOCOR) 40 MG tablet 1/2 tablet daily  90 tablet  3  . temazepam (RESTORIL) 15 MG capsule 1 tab by mouth at night for sleep      . DISCONTD: temazepam (RESTORIL) 15 MG capsule 1-2 tab by mouth at night for sleep as needed  60 capsule  5  . DISCONTD: Calcium Carbonate (CALCIUM 600) 1500 MG TABS Take 2 tablets by mouth daily.       Marland Kitchen DISCONTD: esomeprazole (NEXIUM) 40 MG capsule Take 1 capsule (40 mg total) by mouth 2 (two) times daily.  180 capsule  3  . DISCONTD: sucralfate (CARAFATE) 1 GM/10ML suspension Take 10 mLs (1 g total) by mouth 4 (four) times daily.  420 mL  2

## 2012-09-09 ENCOUNTER — Other Ambulatory Visit (HOSPITAL_BASED_OUTPATIENT_CLINIC_OR_DEPARTMENT_OTHER): Payer: Self-pay | Admitting: Obstetrics and Gynecology

## 2012-09-09 DIAGNOSIS — Z1231 Encounter for screening mammogram for malignant neoplasm of breast: Secondary | ICD-10-CM

## 2012-09-17 ENCOUNTER — Other Ambulatory Visit: Payer: Self-pay | Admitting: Obstetrics and Gynecology

## 2012-09-17 DIAGNOSIS — N63 Unspecified lump in unspecified breast: Secondary | ICD-10-CM

## 2012-09-18 DIAGNOSIS — N6459 Other signs and symptoms in breast: Secondary | ICD-10-CM | POA: Diagnosis not present

## 2012-09-18 DIAGNOSIS — D0590 Unspecified type of carcinoma in situ of unspecified breast: Secondary | ICD-10-CM | POA: Diagnosis not present

## 2012-09-18 DIAGNOSIS — Z803 Family history of malignant neoplasm of breast: Secondary | ICD-10-CM | POA: Diagnosis not present

## 2012-09-18 DIAGNOSIS — N63 Unspecified lump in unspecified breast: Secondary | ICD-10-CM | POA: Diagnosis not present

## 2012-09-20 DIAGNOSIS — R928 Other abnormal and inconclusive findings on diagnostic imaging of breast: Secondary | ICD-10-CM | POA: Diagnosis not present

## 2012-09-20 DIAGNOSIS — C50919 Malignant neoplasm of unspecified site of unspecified female breast: Secondary | ICD-10-CM | POA: Diagnosis not present

## 2012-09-20 DIAGNOSIS — D0590 Unspecified type of carcinoma in situ of unspecified breast: Secondary | ICD-10-CM | POA: Diagnosis not present

## 2012-09-20 DIAGNOSIS — N63 Unspecified lump in unspecified breast: Secondary | ICD-10-CM | POA: Diagnosis not present

## 2012-09-23 DIAGNOSIS — R1013 Epigastric pain: Secondary | ICD-10-CM | POA: Diagnosis not present

## 2012-09-23 DIAGNOSIS — K219 Gastro-esophageal reflux disease without esophagitis: Secondary | ICD-10-CM | POA: Diagnosis not present

## 2012-09-23 DIAGNOSIS — R109 Unspecified abdominal pain: Secondary | ICD-10-CM | POA: Diagnosis not present

## 2012-09-23 DIAGNOSIS — R7309 Other abnormal glucose: Secondary | ICD-10-CM | POA: Diagnosis not present

## 2012-09-24 ENCOUNTER — Other Ambulatory Visit: Payer: Medicare Other

## 2012-09-25 DIAGNOSIS — C50919 Malignant neoplasm of unspecified site of unspecified female breast: Secondary | ICD-10-CM | POA: Diagnosis not present

## 2012-09-27 DIAGNOSIS — IMO0001 Reserved for inherently not codable concepts without codable children: Secondary | ICD-10-CM | POA: Diagnosis not present

## 2012-09-27 DIAGNOSIS — G43909 Migraine, unspecified, not intractable, without status migrainosus: Secondary | ICD-10-CM | POA: Diagnosis not present

## 2012-09-27 DIAGNOSIS — M899 Disorder of bone, unspecified: Secondary | ICD-10-CM | POA: Diagnosis not present

## 2012-09-27 DIAGNOSIS — G8929 Other chronic pain: Secondary | ICD-10-CM | POA: Diagnosis not present

## 2012-09-27 DIAGNOSIS — Z79899 Other long term (current) drug therapy: Secondary | ICD-10-CM | POA: Diagnosis not present

## 2012-09-27 DIAGNOSIS — L821 Other seborrheic keratosis: Secondary | ICD-10-CM | POA: Diagnosis not present

## 2012-09-27 DIAGNOSIS — K219 Gastro-esophageal reflux disease without esophagitis: Secondary | ICD-10-CM | POA: Diagnosis not present

## 2012-09-27 DIAGNOSIS — C50919 Malignant neoplasm of unspecified site of unspecified female breast: Secondary | ICD-10-CM | POA: Diagnosis not present

## 2012-09-27 DIAGNOSIS — N6019 Diffuse cystic mastopathy of unspecified breast: Secondary | ICD-10-CM | POA: Diagnosis not present

## 2012-09-27 DIAGNOSIS — Z8262 Family history of osteoporosis: Secondary | ICD-10-CM | POA: Diagnosis not present

## 2012-09-27 DIAGNOSIS — Z9889 Other specified postprocedural states: Secondary | ICD-10-CM | POA: Diagnosis not present

## 2012-09-27 DIAGNOSIS — I10 Essential (primary) hypertension: Secondary | ICD-10-CM | POA: Diagnosis not present

## 2012-09-27 DIAGNOSIS — D0590 Unspecified type of carcinoma in situ of unspecified breast: Secondary | ICD-10-CM | POA: Diagnosis not present

## 2012-09-27 DIAGNOSIS — J449 Chronic obstructive pulmonary disease, unspecified: Secondary | ICD-10-CM | POA: Diagnosis not present

## 2012-09-27 DIAGNOSIS — Z87891 Personal history of nicotine dependence: Secondary | ICD-10-CM | POA: Diagnosis not present

## 2012-09-30 DIAGNOSIS — I10 Essential (primary) hypertension: Secondary | ICD-10-CM | POA: Diagnosis not present

## 2012-09-30 DIAGNOSIS — N6019 Diffuse cystic mastopathy of unspecified breast: Secondary | ICD-10-CM | POA: Diagnosis not present

## 2012-09-30 DIAGNOSIS — Z79899 Other long term (current) drug therapy: Secondary | ICD-10-CM | POA: Diagnosis not present

## 2012-09-30 DIAGNOSIS — N6489 Other specified disorders of breast: Secondary | ICD-10-CM | POA: Diagnosis not present

## 2012-09-30 DIAGNOSIS — D360 Benign neoplasm of lymph nodes: Secondary | ICD-10-CM | POA: Diagnosis not present

## 2012-09-30 DIAGNOSIS — D0590 Unspecified type of carcinoma in situ of unspecified breast: Secondary | ICD-10-CM | POA: Diagnosis not present

## 2012-09-30 DIAGNOSIS — Z853 Personal history of malignant neoplasm of breast: Secondary | ICD-10-CM | POA: Diagnosis not present

## 2012-09-30 DIAGNOSIS — C50919 Malignant neoplasm of unspecified site of unspecified female breast: Secondary | ICD-10-CM | POA: Diagnosis not present

## 2012-09-30 DIAGNOSIS — J449 Chronic obstructive pulmonary disease, unspecified: Secondary | ICD-10-CM | POA: Diagnosis not present

## 2012-10-01 DIAGNOSIS — N6019 Diffuse cystic mastopathy of unspecified breast: Secondary | ICD-10-CM | POA: Diagnosis not present

## 2012-10-01 DIAGNOSIS — C50919 Malignant neoplasm of unspecified site of unspecified female breast: Secondary | ICD-10-CM | POA: Diagnosis not present

## 2012-10-01 DIAGNOSIS — D0590 Unspecified type of carcinoma in situ of unspecified breast: Secondary | ICD-10-CM | POA: Diagnosis not present

## 2012-10-01 DIAGNOSIS — J449 Chronic obstructive pulmonary disease, unspecified: Secondary | ICD-10-CM | POA: Diagnosis not present

## 2012-10-01 DIAGNOSIS — Z79899 Other long term (current) drug therapy: Secondary | ICD-10-CM | POA: Diagnosis not present

## 2012-10-01 DIAGNOSIS — I10 Essential (primary) hypertension: Secondary | ICD-10-CM | POA: Diagnosis not present

## 2012-10-16 DIAGNOSIS — Z808 Family history of malignant neoplasm of other organs or systems: Secondary | ICD-10-CM | POA: Diagnosis not present

## 2012-10-16 DIAGNOSIS — C50519 Malignant neoplasm of lower-outer quadrant of unspecified female breast: Secondary | ICD-10-CM | POA: Diagnosis not present

## 2012-10-16 DIAGNOSIS — Z87891 Personal history of nicotine dependence: Secondary | ICD-10-CM | POA: Diagnosis not present

## 2012-10-16 DIAGNOSIS — I10 Essential (primary) hypertension: Secondary | ICD-10-CM | POA: Diagnosis not present

## 2012-10-16 DIAGNOSIS — E7800 Pure hypercholesterolemia, unspecified: Secondary | ICD-10-CM | POA: Diagnosis not present

## 2012-10-16 DIAGNOSIS — C50919 Malignant neoplasm of unspecified site of unspecified female breast: Secondary | ICD-10-CM | POA: Diagnosis not present

## 2012-10-16 DIAGNOSIS — J449 Chronic obstructive pulmonary disease, unspecified: Secondary | ICD-10-CM | POA: Diagnosis not present

## 2012-10-17 DIAGNOSIS — C50919 Malignant neoplasm of unspecified site of unspecified female breast: Secondary | ICD-10-CM | POA: Diagnosis not present

## 2012-10-21 DIAGNOSIS — R1084 Generalized abdominal pain: Secondary | ICD-10-CM | POA: Diagnosis not present

## 2012-10-21 DIAGNOSIS — R109 Unspecified abdominal pain: Secondary | ICD-10-CM | POA: Diagnosis not present

## 2012-10-31 DIAGNOSIS — C50919 Malignant neoplasm of unspecified site of unspecified female breast: Secondary | ICD-10-CM | POA: Diagnosis not present

## 2012-11-01 DIAGNOSIS — Z170 Estrogen receptor positive status [ER+]: Secondary | ICD-10-CM | POA: Diagnosis not present

## 2012-11-01 DIAGNOSIS — C50919 Malignant neoplasm of unspecified site of unspecified female breast: Secondary | ICD-10-CM | POA: Diagnosis not present

## 2012-11-11 DIAGNOSIS — Z87891 Personal history of nicotine dependence: Secondary | ICD-10-CM | POA: Diagnosis not present

## 2012-11-11 DIAGNOSIS — Z803 Family history of malignant neoplasm of breast: Secondary | ICD-10-CM | POA: Diagnosis not present

## 2012-11-11 DIAGNOSIS — I10 Essential (primary) hypertension: Secondary | ICD-10-CM | POA: Diagnosis not present

## 2012-11-11 DIAGNOSIS — C50919 Malignant neoplasm of unspecified site of unspecified female breast: Secondary | ICD-10-CM | POA: Diagnosis not present

## 2012-11-11 DIAGNOSIS — Z510 Encounter for antineoplastic radiation therapy: Secondary | ICD-10-CM | POA: Diagnosis not present

## 2012-11-11 DIAGNOSIS — C50519 Malignant neoplasm of lower-outer quadrant of unspecified female breast: Secondary | ICD-10-CM | POA: Diagnosis not present

## 2012-11-11 DIAGNOSIS — E7800 Pure hypercholesterolemia, unspecified: Secondary | ICD-10-CM | POA: Diagnosis not present

## 2012-11-11 DIAGNOSIS — J449 Chronic obstructive pulmonary disease, unspecified: Secondary | ICD-10-CM | POA: Diagnosis not present

## 2012-11-11 DIAGNOSIS — R5382 Chronic fatigue, unspecified: Secondary | ICD-10-CM | POA: Diagnosis not present

## 2012-11-12 ENCOUNTER — Encounter: Payer: Self-pay | Admitting: Internal Medicine

## 2012-11-12 ENCOUNTER — Other Ambulatory Visit (INDEPENDENT_AMBULATORY_CARE_PROVIDER_SITE_OTHER): Payer: Medicare Other

## 2012-11-12 ENCOUNTER — Ambulatory Visit (INDEPENDENT_AMBULATORY_CARE_PROVIDER_SITE_OTHER): Payer: Medicare Other | Admitting: Internal Medicine

## 2012-11-12 VITALS — BP 120/72 | HR 78 | Temp 98.3°F | Ht 65.5 in | Wt 113.5 lb

## 2012-11-12 DIAGNOSIS — I10 Essential (primary) hypertension: Secondary | ICD-10-CM

## 2012-11-12 DIAGNOSIS — Z23 Encounter for immunization: Secondary | ICD-10-CM | POA: Diagnosis not present

## 2012-11-12 DIAGNOSIS — M797 Fibromyalgia: Secondary | ICD-10-CM

## 2012-11-12 DIAGNOSIS — E785 Hyperlipidemia, unspecified: Secondary | ICD-10-CM

## 2012-11-12 DIAGNOSIS — K219 Gastro-esophageal reflux disease without esophagitis: Secondary | ICD-10-CM | POA: Diagnosis not present

## 2012-11-12 DIAGNOSIS — G4700 Insomnia, unspecified: Secondary | ICD-10-CM

## 2012-11-12 DIAGNOSIS — C50919 Malignant neoplasm of unspecified site of unspecified female breast: Secondary | ICD-10-CM

## 2012-11-12 DIAGNOSIS — G9332 Myalgic encephalomyelitis/chronic fatigue syndrome: Secondary | ICD-10-CM

## 2012-11-12 HISTORY — DX: Myalgic encephalomyelitis/chronic fatigue syndrome: G93.32

## 2012-11-12 HISTORY — DX: Fibromyalgia: M79.7

## 2012-11-12 HISTORY — DX: Malignant neoplasm of unspecified site of unspecified female breast: C50.919

## 2012-11-12 LAB — HEPATIC FUNCTION PANEL
ALT: 23 U/L (ref 0–35)
AST: 26 U/L (ref 0–37)
Albumin: 4.3 g/dL (ref 3.5–5.2)

## 2012-11-12 LAB — CBC WITH DIFFERENTIAL/PLATELET
Eosinophils Relative: 0.7 % (ref 0.0–5.0)
Monocytes Relative: 9.9 % (ref 3.0–12.0)
Neutrophils Relative %: 68.8 % (ref 43.0–77.0)
Platelets: 244 10*3/uL (ref 150.0–400.0)
WBC: 8.1 10*3/uL (ref 4.5–10.5)

## 2012-11-12 LAB — URINALYSIS, ROUTINE W REFLEX MICROSCOPIC
Bilirubin Urine: NEGATIVE
Leukocytes, UA: NEGATIVE
Nitrite: NEGATIVE
Specific Gravity, Urine: >=1.03 (ref 1.000–1.030)
pH: 6 (ref 5.0–8.0)

## 2012-11-12 LAB — BASIC METABOLIC PANEL
BUN: 23 mg/dL (ref 6–23)
Chloride: 103 mEq/L (ref 96–112)
Glucose, Bld: 118 mg/dL — ABNORMAL HIGH (ref 70–99)
Potassium: 5.5 mEq/L — ABNORMAL HIGH (ref 3.5–5.1)

## 2012-11-12 LAB — LIPID PANEL
Cholesterol: 189 mg/dL (ref 0–200)
VLDL: 9.2 mg/dL (ref 0.0–40.0)

## 2012-11-12 LAB — TSH: TSH: 1.64 u[IU]/mL (ref 0.35–5.50)

## 2012-11-12 MED ORDER — TEMAZEPAM 15 MG PO CAPS: ORAL_CAPSULE | ORAL | Status: DC

## 2012-11-12 MED ORDER — CARISOPRODOL 350 MG PO TABS: 350.0000 mg | ORAL_TABLET | Freq: Two times a day (BID) | ORAL | Status: DC | PRN

## 2012-11-12 MED ORDER — SIMVASTATIN 40 MG PO TABS: ORAL_TABLET | ORAL | Status: DC

## 2012-11-12 NOTE — Addendum Note (Signed)
Addended by: Scharlene Gloss B on: 11/12/2012 02:08 PM   Modules accepted: Orders

## 2012-11-12 NOTE — Assessment & Plan Note (Signed)
Dx oct 2013 - [facility] - Invasive ductal carcinoma, s/o bilat mastectomy with + margin  - also for XRT soon, then tamoxifen

## 2012-11-12 NOTE — Assessment & Plan Note (Signed)
stable overall by hx and exam, most recent data reviewed with pt, and pt to continue medical treatment as before Lab Results  Component Value Date   LDLCALC 99 10/31/2010

## 2012-11-12 NOTE — Assessment & Plan Note (Signed)
stable overall by hx and exam, most recent data reviewed with pt, and pt to continue medical treatment as before le Lab Results  Component Value Date   WBC 7.6 11/08/2011   HGB 14.5 11/08/2011   HCT 43.4 11/08/2011   PLT 231.0 11/08/2011   GLUCOSE 87 02/06/2012   CHOL 205* 11/08/2011   TRIG 54.0 11/08/2011   HDL 98.60 11/08/2011   LDLDIRECT 89.0 11/08/2011   LDLCALC 99 10/31/2010   ALT 21 11/08/2011   AST 27 11/08/2011   NA 139 02/06/2012   K 3.7 02/06/2012   CL 102 02/06/2012   CREATININE 0.8 02/06/2012   BUN 18 02/06/2012   CO2 26 02/06/2012   TSH 1.65 11/08/2011

## 2012-11-12 NOTE — Progress Notes (Signed)
Subjective:    Patient ID: Earvin Hansen, female    DOB: 1946-04-17, 66 y.o.   MRN: [id number]  HPI  Here to f/u; overall doing ok,  Pt denies chest pain, increased sob or doe, wheezing, orthopnea, PND, increased LE swelling, palpitations, dizziness or syncope.  Pt denies new neurological symptoms such as new headache, or facial or extremity weakness or numbness   Pt denies polydipsia, polyuria, or low sugar symptoms such as weakness or confusion improved with po intake.  Pt states overall good compliance with meds, trying to follow lower cholesterol, diabetic diet, wt overall stable but little exercise however.  Is s/p recent breast cancer tx, for xrt soon.  Dexilant working well for reflux per Dr Boneta Lucks.  Needs med refills today,  Due for pneumovax.  Denies worsening reflux, dysphagia, abd pain, n/v, bowel change or blood. Past Medical History  Diagnosis Date  . COPD (chronic obstructive pulmonary disease)   . Hyperlipidemia   . Hypertension   . Pulmonary Mycobacterium avium complex (MAC) infection   . Bronchiectasis   . History of shingles   . GERD (gastroesophageal reflux disease)   . Fibromyalgia   . Migraine   . Osteopenia   . Squamous cell skin cancer, multiple sites   . DVT, lower extremity     LLE  . Breast cancer 11/12/2012    Dx oct 2013 - [facility] - Invasive ductal carcinoma, s/o bilat mastectomy with + margin  - also for XRT soon   Past Surgical History  Procedure Date  . Shoulder impingement   . Foot surgery   . Mastectomy bilateral oct 2013     reports that she quit smoking about 26 years ago. Her smoking use included Cigarettes. She has a 25 pack-year smoking history. She has never used smokeless tobacco. She reports that she does not drink alcohol or use illicit drugs. family history includes Breast cancer in her other; Colon polyps in her father; Heart disease in her other; Hyperlipidemia in her other; Hypertension in her other; Lung cancer in  her father; and Other in her other. Allergies  Allergen Reactions  . Losartan     headache  . Ciprofloxacin     REACTION: tendinitis  . Clarithromycin     REACTION: severe reflux  . Prednisone     REACTION: irregular heartbeat, not able to sleep   Review of Systems  Constitutional: Negative for diaphoresis and unexpected weight change.  HENT: Negative for tinnitus.   Eyes: Negative for photophobia and visual disturbance.  Respiratory: Negative for choking and stridor.   Gastrointestinal: Negative for vomiting and blood in stool.  Genitourinary: Negative for hematuria and decreased urine volume.  Musculoskeletal: Negative for gait problem.  Skin: Negative for color change and wound.  Neurological: Negative for tremors and numbness.  Psychiatric/Behavioral: Negative for decreased concentration. The patient is not hyperactive.       Objective:   Physical Exam BP 120/72  Pulse 78  Temp 98.3 F (36.8 C) (Oral)  Ht 5' 5.5" (1.664 m)  Wt 113 lb 8 oz (51.483 kg)  BMI 18.60 kg/m2  SpO2 98% Physical Exam  VS noted Constitutional: Pt appears well-developed and well-nourished.  HENT: Head: Normocephalic.  Right Ear: External ear normal.  Left Ear: External ear normal.  Eyes: Conjunctivae and EOM are normal. Pupils are equal, round, and reactive to light.  Neck: Normal range of motion. Neck supple.  Cardiovascular: Normal rate and regular rhythm.   Pulmonary/Chest: Effort normal and  breath sounds normal.  Abd:  Soft, NT, non-distended, + BS Neurological: Pt is alert. Not confused  Skin: Skin is warm. No erythema.  Psychiatric: Pt behavior is normal. Thought content normal.     Assessment & Plan:

## 2012-11-12 NOTE — Assessment & Plan Note (Signed)
stable overall by hx and exam, most recent data reviewed with pt, and pt to continue medical treatment as before BP Readings from Last 3 Encounters:  11/12/12 120/72  09/05/12 122/68  06/11/12 122/70

## 2012-11-12 NOTE — Patient Instructions (Addendum)
You had the pneumonia shot today Continue all other medications as before Your refills were done as requested today Please have the pharmacy call with any other refills you may need. Please go to LAB in the Basement for the blood and/or urine tests to be done today You will be contacted by phone if any changes need to be made immediately.  Otherwise, you will receive a letter about your results with an explanation, but please check with MyChart first. Please continue your efforts at being more active, low cholesterol diet, and weight control. You are otherwise up to date with prevention measures Please keep your appointments with your specialists as you have planned Please return in 1 year for your yearly visit, or sooner if needed

## 2012-11-12 NOTE — Assessment & Plan Note (Signed)
stable overall by hx and exam, and pt to continue medical treatment as before 

## 2012-11-13 ENCOUNTER — Ambulatory Visit: Payer: Medicare Other

## 2012-11-13 DIAGNOSIS — R7309 Other abnormal glucose: Secondary | ICD-10-CM

## 2012-11-13 LAB — HEMOGLOBIN A1C: Hgb A1c MFr Bld: 6.2 % (ref 4.6–6.5)

## 2012-11-27 DIAGNOSIS — I10 Essential (primary) hypertension: Secondary | ICD-10-CM | POA: Diagnosis not present

## 2012-11-27 DIAGNOSIS — C50919 Malignant neoplasm of unspecified site of unspecified female breast: Secondary | ICD-10-CM | POA: Diagnosis not present

## 2012-11-27 DIAGNOSIS — R5382 Chronic fatigue, unspecified: Secondary | ICD-10-CM | POA: Diagnosis not present

## 2012-11-27 DIAGNOSIS — Z510 Encounter for antineoplastic radiation therapy: Secondary | ICD-10-CM | POA: Diagnosis not present

## 2012-11-27 DIAGNOSIS — J449 Chronic obstructive pulmonary disease, unspecified: Secondary | ICD-10-CM | POA: Diagnosis not present

## 2012-11-27 DIAGNOSIS — E7800 Pure hypercholesterolemia, unspecified: Secondary | ICD-10-CM | POA: Diagnosis not present

## 2012-11-29 DIAGNOSIS — Z510 Encounter for antineoplastic radiation therapy: Secondary | ICD-10-CM | POA: Diagnosis not present

## 2012-11-29 DIAGNOSIS — C50919 Malignant neoplasm of unspecified site of unspecified female breast: Secondary | ICD-10-CM | POA: Diagnosis not present

## 2012-11-29 DIAGNOSIS — E7800 Pure hypercholesterolemia, unspecified: Secondary | ICD-10-CM | POA: Diagnosis not present

## 2012-11-29 DIAGNOSIS — I10 Essential (primary) hypertension: Secondary | ICD-10-CM | POA: Diagnosis not present

## 2012-11-29 DIAGNOSIS — R5382 Chronic fatigue, unspecified: Secondary | ICD-10-CM | POA: Diagnosis not present

## 2012-11-29 DIAGNOSIS — J449 Chronic obstructive pulmonary disease, unspecified: Secondary | ICD-10-CM | POA: Diagnosis not present

## 2012-12-02 DIAGNOSIS — J449 Chronic obstructive pulmonary disease, unspecified: Secondary | ICD-10-CM | POA: Diagnosis not present

## 2012-12-02 DIAGNOSIS — C50519 Malignant neoplasm of lower-outer quadrant of unspecified female breast: Secondary | ICD-10-CM | POA: Diagnosis not present

## 2012-12-02 DIAGNOSIS — Z510 Encounter for antineoplastic radiation therapy: Secondary | ICD-10-CM | POA: Diagnosis not present

## 2012-12-02 DIAGNOSIS — I10 Essential (primary) hypertension: Secondary | ICD-10-CM | POA: Diagnosis not present

## 2012-12-02 DIAGNOSIS — C50919 Malignant neoplasm of unspecified site of unspecified female breast: Secondary | ICD-10-CM | POA: Diagnosis not present

## 2012-12-02 DIAGNOSIS — R5382 Chronic fatigue, unspecified: Secondary | ICD-10-CM | POA: Diagnosis not present

## 2012-12-02 DIAGNOSIS — E7800 Pure hypercholesterolemia, unspecified: Secondary | ICD-10-CM | POA: Diagnosis not present

## 2012-12-03 DIAGNOSIS — J449 Chronic obstructive pulmonary disease, unspecified: Secondary | ICD-10-CM | POA: Diagnosis not present

## 2012-12-03 DIAGNOSIS — E7800 Pure hypercholesterolemia, unspecified: Secondary | ICD-10-CM | POA: Diagnosis not present

## 2012-12-03 DIAGNOSIS — R5382 Chronic fatigue, unspecified: Secondary | ICD-10-CM | POA: Diagnosis not present

## 2012-12-03 DIAGNOSIS — I10 Essential (primary) hypertension: Secondary | ICD-10-CM | POA: Diagnosis not present

## 2012-12-03 DIAGNOSIS — C50919 Malignant neoplasm of unspecified site of unspecified female breast: Secondary | ICD-10-CM | POA: Diagnosis not present

## 2012-12-03 DIAGNOSIS — Z510 Encounter for antineoplastic radiation therapy: Secondary | ICD-10-CM | POA: Diagnosis not present

## 2012-12-05 DIAGNOSIS — J449 Chronic obstructive pulmonary disease, unspecified: Secondary | ICD-10-CM | POA: Diagnosis not present

## 2012-12-05 DIAGNOSIS — C50919 Malignant neoplasm of unspecified site of unspecified female breast: Secondary | ICD-10-CM | POA: Diagnosis not present

## 2012-12-05 DIAGNOSIS — I10 Essential (primary) hypertension: Secondary | ICD-10-CM | POA: Diagnosis not present

## 2012-12-05 DIAGNOSIS — R5382 Chronic fatigue, unspecified: Secondary | ICD-10-CM | POA: Diagnosis not present

## 2012-12-05 DIAGNOSIS — Z510 Encounter for antineoplastic radiation therapy: Secondary | ICD-10-CM | POA: Diagnosis not present

## 2012-12-05 DIAGNOSIS — E7800 Pure hypercholesterolemia, unspecified: Secondary | ICD-10-CM | POA: Diagnosis not present

## 2012-12-06 DIAGNOSIS — E7800 Pure hypercholesterolemia, unspecified: Secondary | ICD-10-CM | POA: Diagnosis not present

## 2012-12-06 DIAGNOSIS — R5382 Chronic fatigue, unspecified: Secondary | ICD-10-CM | POA: Diagnosis not present

## 2012-12-06 DIAGNOSIS — Z510 Encounter for antineoplastic radiation therapy: Secondary | ICD-10-CM | POA: Diagnosis not present

## 2012-12-06 DIAGNOSIS — C50919 Malignant neoplasm of unspecified site of unspecified female breast: Secondary | ICD-10-CM | POA: Diagnosis not present

## 2012-12-06 DIAGNOSIS — I10 Essential (primary) hypertension: Secondary | ICD-10-CM | POA: Diagnosis not present

## 2012-12-06 DIAGNOSIS — J449 Chronic obstructive pulmonary disease, unspecified: Secondary | ICD-10-CM | POA: Diagnosis not present

## 2012-12-09 DIAGNOSIS — C50919 Malignant neoplasm of unspecified site of unspecified female breast: Secondary | ICD-10-CM | POA: Diagnosis not present

## 2012-12-09 DIAGNOSIS — C50519 Malignant neoplasm of lower-outer quadrant of unspecified female breast: Secondary | ICD-10-CM | POA: Diagnosis not present

## 2012-12-09 DIAGNOSIS — I10 Essential (primary) hypertension: Secondary | ICD-10-CM | POA: Diagnosis not present

## 2012-12-09 DIAGNOSIS — R5382 Chronic fatigue, unspecified: Secondary | ICD-10-CM | POA: Diagnosis not present

## 2012-12-09 DIAGNOSIS — E7800 Pure hypercholesterolemia, unspecified: Secondary | ICD-10-CM | POA: Diagnosis not present

## 2012-12-09 DIAGNOSIS — J449 Chronic obstructive pulmonary disease, unspecified: Secondary | ICD-10-CM | POA: Diagnosis not present

## 2012-12-09 DIAGNOSIS — Z510 Encounter for antineoplastic radiation therapy: Secondary | ICD-10-CM | POA: Diagnosis not present

## 2012-12-10 DIAGNOSIS — E7800 Pure hypercholesterolemia, unspecified: Secondary | ICD-10-CM | POA: Diagnosis not present

## 2012-12-10 DIAGNOSIS — J449 Chronic obstructive pulmonary disease, unspecified: Secondary | ICD-10-CM | POA: Diagnosis not present

## 2012-12-10 DIAGNOSIS — I10 Essential (primary) hypertension: Secondary | ICD-10-CM | POA: Diagnosis not present

## 2012-12-10 DIAGNOSIS — Z510 Encounter for antineoplastic radiation therapy: Secondary | ICD-10-CM | POA: Diagnosis not present

## 2012-12-10 DIAGNOSIS — R5382 Chronic fatigue, unspecified: Secondary | ICD-10-CM | POA: Diagnosis not present

## 2012-12-10 DIAGNOSIS — C50919 Malignant neoplasm of unspecified site of unspecified female breast: Secondary | ICD-10-CM | POA: Diagnosis not present

## 2012-12-12 ENCOUNTER — Ambulatory Visit (HOSPITAL_BASED_OUTPATIENT_CLINIC_OR_DEPARTMENT_OTHER): Payer: Medicare Other

## 2012-12-12 DIAGNOSIS — J449 Chronic obstructive pulmonary disease, unspecified: Secondary | ICD-10-CM | POA: Diagnosis not present

## 2012-12-12 DIAGNOSIS — C50919 Malignant neoplasm of unspecified site of unspecified female breast: Secondary | ICD-10-CM | POA: Diagnosis not present

## 2012-12-12 DIAGNOSIS — Z808 Family history of malignant neoplasm of other organs or systems: Secondary | ICD-10-CM | POA: Diagnosis not present

## 2012-12-12 DIAGNOSIS — E7800 Pure hypercholesterolemia, unspecified: Secondary | ICD-10-CM | POA: Diagnosis not present

## 2012-12-12 DIAGNOSIS — Z87891 Personal history of nicotine dependence: Secondary | ICD-10-CM | POA: Diagnosis not present

## 2012-12-12 DIAGNOSIS — I10 Essential (primary) hypertension: Secondary | ICD-10-CM | POA: Diagnosis not present

## 2012-12-12 DIAGNOSIS — Z510 Encounter for antineoplastic radiation therapy: Secondary | ICD-10-CM | POA: Diagnosis not present

## 2012-12-13 DIAGNOSIS — Z510 Encounter for antineoplastic radiation therapy: Secondary | ICD-10-CM | POA: Diagnosis not present

## 2012-12-13 DIAGNOSIS — J449 Chronic obstructive pulmonary disease, unspecified: Secondary | ICD-10-CM | POA: Diagnosis not present

## 2012-12-13 DIAGNOSIS — E7800 Pure hypercholesterolemia, unspecified: Secondary | ICD-10-CM | POA: Diagnosis not present

## 2012-12-13 DIAGNOSIS — I10 Essential (primary) hypertension: Secondary | ICD-10-CM | POA: Diagnosis not present

## 2012-12-13 DIAGNOSIS — Z808 Family history of malignant neoplasm of other organs or systems: Secondary | ICD-10-CM | POA: Diagnosis not present

## 2012-12-13 DIAGNOSIS — C50919 Malignant neoplasm of unspecified site of unspecified female breast: Secondary | ICD-10-CM | POA: Diagnosis not present

## 2012-12-16 DIAGNOSIS — Z808 Family history of malignant neoplasm of other organs or systems: Secondary | ICD-10-CM | POA: Diagnosis not present

## 2012-12-16 DIAGNOSIS — E7800 Pure hypercholesterolemia, unspecified: Secondary | ICD-10-CM | POA: Diagnosis not present

## 2012-12-16 DIAGNOSIS — C50919 Malignant neoplasm of unspecified site of unspecified female breast: Secondary | ICD-10-CM | POA: Diagnosis not present

## 2012-12-16 DIAGNOSIS — I10 Essential (primary) hypertension: Secondary | ICD-10-CM | POA: Diagnosis not present

## 2012-12-16 DIAGNOSIS — C50519 Malignant neoplasm of lower-outer quadrant of unspecified female breast: Secondary | ICD-10-CM | POA: Diagnosis not present

## 2012-12-16 DIAGNOSIS — Z510 Encounter for antineoplastic radiation therapy: Secondary | ICD-10-CM | POA: Diagnosis not present

## 2012-12-16 DIAGNOSIS — J449 Chronic obstructive pulmonary disease, unspecified: Secondary | ICD-10-CM | POA: Diagnosis not present

## 2012-12-17 DIAGNOSIS — I10 Essential (primary) hypertension: Secondary | ICD-10-CM | POA: Diagnosis not present

## 2012-12-17 DIAGNOSIS — J449 Chronic obstructive pulmonary disease, unspecified: Secondary | ICD-10-CM | POA: Diagnosis not present

## 2012-12-17 DIAGNOSIS — Z510 Encounter for antineoplastic radiation therapy: Secondary | ICD-10-CM | POA: Diagnosis not present

## 2012-12-17 DIAGNOSIS — C50919 Malignant neoplasm of unspecified site of unspecified female breast: Secondary | ICD-10-CM | POA: Diagnosis not present

## 2012-12-17 DIAGNOSIS — Z808 Family history of malignant neoplasm of other organs or systems: Secondary | ICD-10-CM | POA: Diagnosis not present

## 2012-12-17 DIAGNOSIS — E7800 Pure hypercholesterolemia, unspecified: Secondary | ICD-10-CM | POA: Diagnosis not present

## 2012-12-18 DIAGNOSIS — Z510 Encounter for antineoplastic radiation therapy: Secondary | ICD-10-CM | POA: Diagnosis not present

## 2012-12-18 DIAGNOSIS — C50919 Malignant neoplasm of unspecified site of unspecified female breast: Secondary | ICD-10-CM | POA: Diagnosis not present

## 2012-12-18 DIAGNOSIS — Z808 Family history of malignant neoplasm of other organs or systems: Secondary | ICD-10-CM | POA: Diagnosis not present

## 2012-12-18 DIAGNOSIS — J449 Chronic obstructive pulmonary disease, unspecified: Secondary | ICD-10-CM | POA: Diagnosis not present

## 2012-12-18 DIAGNOSIS — E7800 Pure hypercholesterolemia, unspecified: Secondary | ICD-10-CM | POA: Diagnosis not present

## 2012-12-18 DIAGNOSIS — I10 Essential (primary) hypertension: Secondary | ICD-10-CM | POA: Diagnosis not present

## 2012-12-19 DIAGNOSIS — C50919 Malignant neoplasm of unspecified site of unspecified female breast: Secondary | ICD-10-CM | POA: Diagnosis not present

## 2012-12-19 DIAGNOSIS — Z808 Family history of malignant neoplasm of other organs or systems: Secondary | ICD-10-CM | POA: Diagnosis not present

## 2012-12-19 DIAGNOSIS — E7800 Pure hypercholesterolemia, unspecified: Secondary | ICD-10-CM | POA: Diagnosis not present

## 2012-12-19 DIAGNOSIS — J449 Chronic obstructive pulmonary disease, unspecified: Secondary | ICD-10-CM | POA: Diagnosis not present

## 2012-12-19 DIAGNOSIS — I10 Essential (primary) hypertension: Secondary | ICD-10-CM | POA: Diagnosis not present

## 2012-12-19 DIAGNOSIS — Z510 Encounter for antineoplastic radiation therapy: Secondary | ICD-10-CM | POA: Diagnosis not present

## 2012-12-20 DIAGNOSIS — Z510 Encounter for antineoplastic radiation therapy: Secondary | ICD-10-CM | POA: Diagnosis not present

## 2012-12-20 DIAGNOSIS — E7800 Pure hypercholesterolemia, unspecified: Secondary | ICD-10-CM | POA: Diagnosis not present

## 2012-12-20 DIAGNOSIS — Z808 Family history of malignant neoplasm of other organs or systems: Secondary | ICD-10-CM | POA: Diagnosis not present

## 2012-12-20 DIAGNOSIS — I10 Essential (primary) hypertension: Secondary | ICD-10-CM | POA: Diagnosis not present

## 2012-12-20 DIAGNOSIS — J449 Chronic obstructive pulmonary disease, unspecified: Secondary | ICD-10-CM | POA: Diagnosis not present

## 2012-12-20 DIAGNOSIS — C50919 Malignant neoplasm of unspecified site of unspecified female breast: Secondary | ICD-10-CM | POA: Diagnosis not present

## 2012-12-23 DIAGNOSIS — C50919 Malignant neoplasm of unspecified site of unspecified female breast: Secondary | ICD-10-CM | POA: Diagnosis not present

## 2012-12-23 DIAGNOSIS — Z510 Encounter for antineoplastic radiation therapy: Secondary | ICD-10-CM | POA: Diagnosis not present

## 2012-12-23 DIAGNOSIS — Z808 Family history of malignant neoplasm of other organs or systems: Secondary | ICD-10-CM | POA: Diagnosis not present

## 2012-12-23 DIAGNOSIS — C50519 Malignant neoplasm of lower-outer quadrant of unspecified female breast: Secondary | ICD-10-CM | POA: Diagnosis not present

## 2012-12-23 DIAGNOSIS — I10 Essential (primary) hypertension: Secondary | ICD-10-CM | POA: Diagnosis not present

## 2012-12-23 DIAGNOSIS — J449 Chronic obstructive pulmonary disease, unspecified: Secondary | ICD-10-CM | POA: Diagnosis not present

## 2012-12-23 DIAGNOSIS — E7800 Pure hypercholesterolemia, unspecified: Secondary | ICD-10-CM | POA: Diagnosis not present

## 2012-12-24 DIAGNOSIS — Z808 Family history of malignant neoplasm of other organs or systems: Secondary | ICD-10-CM | POA: Diagnosis not present

## 2012-12-24 DIAGNOSIS — E7800 Pure hypercholesterolemia, unspecified: Secondary | ICD-10-CM | POA: Diagnosis not present

## 2012-12-24 DIAGNOSIS — C50919 Malignant neoplasm of unspecified site of unspecified female breast: Secondary | ICD-10-CM | POA: Diagnosis not present

## 2012-12-24 DIAGNOSIS — J449 Chronic obstructive pulmonary disease, unspecified: Secondary | ICD-10-CM | POA: Diagnosis not present

## 2012-12-24 DIAGNOSIS — Z510 Encounter for antineoplastic radiation therapy: Secondary | ICD-10-CM | POA: Diagnosis not present

## 2012-12-24 DIAGNOSIS — I10 Essential (primary) hypertension: Secondary | ICD-10-CM | POA: Diagnosis not present

## 2012-12-25 DIAGNOSIS — Z808 Family history of malignant neoplasm of other organs or systems: Secondary | ICD-10-CM | POA: Diagnosis not present

## 2012-12-25 DIAGNOSIS — C50919 Malignant neoplasm of unspecified site of unspecified female breast: Secondary | ICD-10-CM | POA: Diagnosis not present

## 2012-12-25 DIAGNOSIS — Z510 Encounter for antineoplastic radiation therapy: Secondary | ICD-10-CM | POA: Diagnosis not present

## 2012-12-25 DIAGNOSIS — I10 Essential (primary) hypertension: Secondary | ICD-10-CM | POA: Diagnosis not present

## 2012-12-25 DIAGNOSIS — E7800 Pure hypercholesterolemia, unspecified: Secondary | ICD-10-CM | POA: Diagnosis not present

## 2012-12-25 DIAGNOSIS — J449 Chronic obstructive pulmonary disease, unspecified: Secondary | ICD-10-CM | POA: Diagnosis not present

## 2012-12-26 DIAGNOSIS — Z808 Family history of malignant neoplasm of other organs or systems: Secondary | ICD-10-CM | POA: Diagnosis not present

## 2012-12-26 DIAGNOSIS — C50919 Malignant neoplasm of unspecified site of unspecified female breast: Secondary | ICD-10-CM | POA: Diagnosis not present

## 2012-12-26 DIAGNOSIS — I10 Essential (primary) hypertension: Secondary | ICD-10-CM | POA: Diagnosis not present

## 2012-12-26 DIAGNOSIS — Z510 Encounter for antineoplastic radiation therapy: Secondary | ICD-10-CM | POA: Diagnosis not present

## 2012-12-26 DIAGNOSIS — E7800 Pure hypercholesterolemia, unspecified: Secondary | ICD-10-CM | POA: Diagnosis not present

## 2012-12-26 DIAGNOSIS — J449 Chronic obstructive pulmonary disease, unspecified: Secondary | ICD-10-CM | POA: Diagnosis not present

## 2012-12-27 DIAGNOSIS — M949 Disorder of cartilage, unspecified: Secondary | ICD-10-CM | POA: Diagnosis not present

## 2012-12-27 DIAGNOSIS — Z510 Encounter for antineoplastic radiation therapy: Secondary | ICD-10-CM | POA: Diagnosis not present

## 2012-12-27 DIAGNOSIS — Z808 Family history of malignant neoplasm of other organs or systems: Secondary | ICD-10-CM | POA: Diagnosis not present

## 2012-12-27 DIAGNOSIS — E7800 Pure hypercholesterolemia, unspecified: Secondary | ICD-10-CM | POA: Diagnosis not present

## 2012-12-27 DIAGNOSIS — I10 Essential (primary) hypertension: Secondary | ICD-10-CM | POA: Diagnosis not present

## 2012-12-27 DIAGNOSIS — J449 Chronic obstructive pulmonary disease, unspecified: Secondary | ICD-10-CM | POA: Diagnosis not present

## 2012-12-27 DIAGNOSIS — C50919 Malignant neoplasm of unspecified site of unspecified female breast: Secondary | ICD-10-CM | POA: Diagnosis not present

## 2012-12-27 DIAGNOSIS — Z170 Estrogen receptor positive status [ER+]: Secondary | ICD-10-CM | POA: Diagnosis not present

## 2012-12-30 DIAGNOSIS — I10 Essential (primary) hypertension: Secondary | ICD-10-CM | POA: Diagnosis not present

## 2012-12-30 DIAGNOSIS — Z808 Family history of malignant neoplasm of other organs or systems: Secondary | ICD-10-CM | POA: Diagnosis not present

## 2012-12-30 DIAGNOSIS — C50919 Malignant neoplasm of unspecified site of unspecified female breast: Secondary | ICD-10-CM | POA: Diagnosis not present

## 2012-12-30 DIAGNOSIS — E7800 Pure hypercholesterolemia, unspecified: Secondary | ICD-10-CM | POA: Diagnosis not present

## 2012-12-30 DIAGNOSIS — J449 Chronic obstructive pulmonary disease, unspecified: Secondary | ICD-10-CM | POA: Diagnosis not present

## 2012-12-30 DIAGNOSIS — Z510 Encounter for antineoplastic radiation therapy: Secondary | ICD-10-CM | POA: Diagnosis not present

## 2012-12-31 DIAGNOSIS — Z808 Family history of malignant neoplasm of other organs or systems: Secondary | ICD-10-CM | POA: Diagnosis not present

## 2012-12-31 DIAGNOSIS — E7800 Pure hypercholesterolemia, unspecified: Secondary | ICD-10-CM | POA: Diagnosis not present

## 2012-12-31 DIAGNOSIS — J449 Chronic obstructive pulmonary disease, unspecified: Secondary | ICD-10-CM | POA: Diagnosis not present

## 2012-12-31 DIAGNOSIS — C50919 Malignant neoplasm of unspecified site of unspecified female breast: Secondary | ICD-10-CM | POA: Diagnosis not present

## 2012-12-31 DIAGNOSIS — Z510 Encounter for antineoplastic radiation therapy: Secondary | ICD-10-CM | POA: Diagnosis not present

## 2012-12-31 DIAGNOSIS — I10 Essential (primary) hypertension: Secondary | ICD-10-CM | POA: Diagnosis not present

## 2013-01-01 DIAGNOSIS — E7800 Pure hypercholesterolemia, unspecified: Secondary | ICD-10-CM | POA: Diagnosis not present

## 2013-01-01 DIAGNOSIS — I10 Essential (primary) hypertension: Secondary | ICD-10-CM | POA: Diagnosis not present

## 2013-01-01 DIAGNOSIS — Z510 Encounter for antineoplastic radiation therapy: Secondary | ICD-10-CM | POA: Diagnosis not present

## 2013-01-01 DIAGNOSIS — J449 Chronic obstructive pulmonary disease, unspecified: Secondary | ICD-10-CM | POA: Diagnosis not present

## 2013-01-01 DIAGNOSIS — C50919 Malignant neoplasm of unspecified site of unspecified female breast: Secondary | ICD-10-CM | POA: Diagnosis not present

## 2013-01-01 DIAGNOSIS — Z808 Family history of malignant neoplasm of other organs or systems: Secondary | ICD-10-CM | POA: Diagnosis not present

## 2013-01-02 DIAGNOSIS — C50919 Malignant neoplasm of unspecified site of unspecified female breast: Secondary | ICD-10-CM | POA: Diagnosis not present

## 2013-01-02 DIAGNOSIS — Z510 Encounter for antineoplastic radiation therapy: Secondary | ICD-10-CM | POA: Diagnosis not present

## 2013-01-02 DIAGNOSIS — J449 Chronic obstructive pulmonary disease, unspecified: Secondary | ICD-10-CM | POA: Diagnosis not present

## 2013-01-02 DIAGNOSIS — E7800 Pure hypercholesterolemia, unspecified: Secondary | ICD-10-CM | POA: Diagnosis not present

## 2013-01-02 DIAGNOSIS — I10 Essential (primary) hypertension: Secondary | ICD-10-CM | POA: Diagnosis not present

## 2013-01-02 DIAGNOSIS — Z808 Family history of malignant neoplasm of other organs or systems: Secondary | ICD-10-CM | POA: Diagnosis not present

## 2013-01-03 DIAGNOSIS — I10 Essential (primary) hypertension: Secondary | ICD-10-CM | POA: Diagnosis not present

## 2013-01-03 DIAGNOSIS — C50919 Malignant neoplasm of unspecified site of unspecified female breast: Secondary | ICD-10-CM | POA: Diagnosis not present

## 2013-01-03 DIAGNOSIS — J449 Chronic obstructive pulmonary disease, unspecified: Secondary | ICD-10-CM | POA: Diagnosis not present

## 2013-01-03 DIAGNOSIS — Z510 Encounter for antineoplastic radiation therapy: Secondary | ICD-10-CM | POA: Diagnosis not present

## 2013-01-03 DIAGNOSIS — Z808 Family history of malignant neoplasm of other organs or systems: Secondary | ICD-10-CM | POA: Diagnosis not present

## 2013-01-03 DIAGNOSIS — E7800 Pure hypercholesterolemia, unspecified: Secondary | ICD-10-CM | POA: Diagnosis not present

## 2013-01-06 DIAGNOSIS — E7800 Pure hypercholesterolemia, unspecified: Secondary | ICD-10-CM | POA: Diagnosis not present

## 2013-01-06 DIAGNOSIS — Z808 Family history of malignant neoplasm of other organs or systems: Secondary | ICD-10-CM | POA: Diagnosis not present

## 2013-01-06 DIAGNOSIS — I10 Essential (primary) hypertension: Secondary | ICD-10-CM | POA: Diagnosis not present

## 2013-01-06 DIAGNOSIS — C50519 Malignant neoplasm of lower-outer quadrant of unspecified female breast: Secondary | ICD-10-CM | POA: Diagnosis not present

## 2013-01-06 DIAGNOSIS — Z510 Encounter for antineoplastic radiation therapy: Secondary | ICD-10-CM | POA: Diagnosis not present

## 2013-01-06 DIAGNOSIS — C50919 Malignant neoplasm of unspecified site of unspecified female breast: Secondary | ICD-10-CM | POA: Diagnosis not present

## 2013-01-06 DIAGNOSIS — J449 Chronic obstructive pulmonary disease, unspecified: Secondary | ICD-10-CM | POA: Diagnosis not present

## 2013-01-07 DIAGNOSIS — Z808 Family history of malignant neoplasm of other organs or systems: Secondary | ICD-10-CM | POA: Diagnosis not present

## 2013-01-07 DIAGNOSIS — Z510 Encounter for antineoplastic radiation therapy: Secondary | ICD-10-CM | POA: Diagnosis not present

## 2013-01-07 DIAGNOSIS — C50919 Malignant neoplasm of unspecified site of unspecified female breast: Secondary | ICD-10-CM | POA: Diagnosis not present

## 2013-01-07 DIAGNOSIS — E7800 Pure hypercholesterolemia, unspecified: Secondary | ICD-10-CM | POA: Diagnosis not present

## 2013-01-07 DIAGNOSIS — J449 Chronic obstructive pulmonary disease, unspecified: Secondary | ICD-10-CM | POA: Diagnosis not present

## 2013-01-07 DIAGNOSIS — Z9189 Other specified personal risk factors, not elsewhere classified: Secondary | ICD-10-CM | POA: Diagnosis not present

## 2013-01-07 DIAGNOSIS — C50519 Malignant neoplasm of lower-outer quadrant of unspecified female breast: Secondary | ICD-10-CM | POA: Diagnosis not present

## 2013-01-07 DIAGNOSIS — I10 Essential (primary) hypertension: Secondary | ICD-10-CM | POA: Diagnosis not present

## 2013-01-07 DIAGNOSIS — Z170 Estrogen receptor positive status [ER+]: Secondary | ICD-10-CM | POA: Diagnosis not present

## 2013-01-07 DIAGNOSIS — Z124 Encounter for screening for malignant neoplasm of cervix: Secondary | ICD-10-CM | POA: Diagnosis not present

## 2013-01-08 DIAGNOSIS — E7800 Pure hypercholesterolemia, unspecified: Secondary | ICD-10-CM | POA: Diagnosis not present

## 2013-01-08 DIAGNOSIS — Z510 Encounter for antineoplastic radiation therapy: Secondary | ICD-10-CM | POA: Diagnosis not present

## 2013-01-08 DIAGNOSIS — I10 Essential (primary) hypertension: Secondary | ICD-10-CM | POA: Diagnosis not present

## 2013-01-08 DIAGNOSIS — Z808 Family history of malignant neoplasm of other organs or systems: Secondary | ICD-10-CM | POA: Diagnosis not present

## 2013-01-08 DIAGNOSIS — J449 Chronic obstructive pulmonary disease, unspecified: Secondary | ICD-10-CM | POA: Diagnosis not present

## 2013-01-08 DIAGNOSIS — C50919 Malignant neoplasm of unspecified site of unspecified female breast: Secondary | ICD-10-CM | POA: Diagnosis not present

## 2013-01-09 DIAGNOSIS — J449 Chronic obstructive pulmonary disease, unspecified: Secondary | ICD-10-CM | POA: Diagnosis not present

## 2013-01-09 DIAGNOSIS — Z510 Encounter for antineoplastic radiation therapy: Secondary | ICD-10-CM | POA: Diagnosis not present

## 2013-01-09 DIAGNOSIS — I10 Essential (primary) hypertension: Secondary | ICD-10-CM | POA: Diagnosis not present

## 2013-01-09 DIAGNOSIS — Z808 Family history of malignant neoplasm of other organs or systems: Secondary | ICD-10-CM | POA: Diagnosis not present

## 2013-01-09 DIAGNOSIS — C50919 Malignant neoplasm of unspecified site of unspecified female breast: Secondary | ICD-10-CM | POA: Diagnosis not present

## 2013-01-09 DIAGNOSIS — E7800 Pure hypercholesterolemia, unspecified: Secondary | ICD-10-CM | POA: Diagnosis not present

## 2013-01-10 DIAGNOSIS — C50919 Malignant neoplasm of unspecified site of unspecified female breast: Secondary | ICD-10-CM | POA: Diagnosis not present

## 2013-01-10 DIAGNOSIS — J449 Chronic obstructive pulmonary disease, unspecified: Secondary | ICD-10-CM | POA: Diagnosis not present

## 2013-01-10 DIAGNOSIS — E7800 Pure hypercholesterolemia, unspecified: Secondary | ICD-10-CM | POA: Diagnosis not present

## 2013-01-10 DIAGNOSIS — I10 Essential (primary) hypertension: Secondary | ICD-10-CM | POA: Diagnosis not present

## 2013-01-10 DIAGNOSIS — Z808 Family history of malignant neoplasm of other organs or systems: Secondary | ICD-10-CM | POA: Diagnosis not present

## 2013-01-10 DIAGNOSIS — Z510 Encounter for antineoplastic radiation therapy: Secondary | ICD-10-CM | POA: Diagnosis not present

## 2013-01-13 DIAGNOSIS — Z803 Family history of malignant neoplasm of breast: Secondary | ICD-10-CM | POA: Diagnosis not present

## 2013-01-13 DIAGNOSIS — J449 Chronic obstructive pulmonary disease, unspecified: Secondary | ICD-10-CM | POA: Diagnosis not present

## 2013-01-13 DIAGNOSIS — Z510 Encounter for antineoplastic radiation therapy: Secondary | ICD-10-CM | POA: Diagnosis not present

## 2013-01-13 DIAGNOSIS — Z87891 Personal history of nicotine dependence: Secondary | ICD-10-CM | POA: Diagnosis not present

## 2013-01-13 DIAGNOSIS — I10 Essential (primary) hypertension: Secondary | ICD-10-CM | POA: Diagnosis not present

## 2013-01-13 DIAGNOSIS — E7800 Pure hypercholesterolemia, unspecified: Secondary | ICD-10-CM | POA: Diagnosis not present

## 2013-01-13 DIAGNOSIS — C50519 Malignant neoplasm of lower-outer quadrant of unspecified female breast: Secondary | ICD-10-CM | POA: Diagnosis not present

## 2013-01-14 DIAGNOSIS — Z510 Encounter for antineoplastic radiation therapy: Secondary | ICD-10-CM | POA: Diagnosis not present

## 2013-01-14 DIAGNOSIS — I10 Essential (primary) hypertension: Secondary | ICD-10-CM | POA: Diagnosis not present

## 2013-01-14 DIAGNOSIS — Z803 Family history of malignant neoplasm of breast: Secondary | ICD-10-CM | POA: Diagnosis not present

## 2013-01-14 DIAGNOSIS — C50519 Malignant neoplasm of lower-outer quadrant of unspecified female breast: Secondary | ICD-10-CM | POA: Diagnosis not present

## 2013-01-14 DIAGNOSIS — J449 Chronic obstructive pulmonary disease, unspecified: Secondary | ICD-10-CM | POA: Diagnosis not present

## 2013-01-14 DIAGNOSIS — E7800 Pure hypercholesterolemia, unspecified: Secondary | ICD-10-CM | POA: Diagnosis not present

## 2013-01-15 DIAGNOSIS — J449 Chronic obstructive pulmonary disease, unspecified: Secondary | ICD-10-CM | POA: Diagnosis not present

## 2013-01-15 DIAGNOSIS — Z510 Encounter for antineoplastic radiation therapy: Secondary | ICD-10-CM | POA: Diagnosis not present

## 2013-01-15 DIAGNOSIS — Z803 Family history of malignant neoplasm of breast: Secondary | ICD-10-CM | POA: Diagnosis not present

## 2013-01-15 DIAGNOSIS — E7800 Pure hypercholesterolemia, unspecified: Secondary | ICD-10-CM | POA: Diagnosis not present

## 2013-01-15 DIAGNOSIS — I10 Essential (primary) hypertension: Secondary | ICD-10-CM | POA: Diagnosis not present

## 2013-01-15 DIAGNOSIS — C50519 Malignant neoplasm of lower-outer quadrant of unspecified female breast: Secondary | ICD-10-CM | POA: Diagnosis not present

## 2013-01-16 DIAGNOSIS — E7800 Pure hypercholesterolemia, unspecified: Secondary | ICD-10-CM | POA: Diagnosis not present

## 2013-01-16 DIAGNOSIS — Z803 Family history of malignant neoplasm of breast: Secondary | ICD-10-CM | POA: Diagnosis not present

## 2013-01-16 DIAGNOSIS — J449 Chronic obstructive pulmonary disease, unspecified: Secondary | ICD-10-CM | POA: Diagnosis not present

## 2013-01-16 DIAGNOSIS — Z510 Encounter for antineoplastic radiation therapy: Secondary | ICD-10-CM | POA: Diagnosis not present

## 2013-01-16 DIAGNOSIS — C50519 Malignant neoplasm of lower-outer quadrant of unspecified female breast: Secondary | ICD-10-CM | POA: Diagnosis not present

## 2013-01-16 DIAGNOSIS — I10 Essential (primary) hypertension: Secondary | ICD-10-CM | POA: Diagnosis not present

## 2013-01-17 DIAGNOSIS — C50519 Malignant neoplasm of lower-outer quadrant of unspecified female breast: Secondary | ICD-10-CM | POA: Diagnosis not present

## 2013-01-17 DIAGNOSIS — I10 Essential (primary) hypertension: Secondary | ICD-10-CM | POA: Diagnosis not present

## 2013-01-17 DIAGNOSIS — J449 Chronic obstructive pulmonary disease, unspecified: Secondary | ICD-10-CM | POA: Diagnosis not present

## 2013-01-17 DIAGNOSIS — E7800 Pure hypercholesterolemia, unspecified: Secondary | ICD-10-CM | POA: Diagnosis not present

## 2013-01-17 DIAGNOSIS — Z510 Encounter for antineoplastic radiation therapy: Secondary | ICD-10-CM | POA: Diagnosis not present

## 2013-01-17 DIAGNOSIS — Z803 Family history of malignant neoplasm of breast: Secondary | ICD-10-CM | POA: Diagnosis not present

## 2013-01-20 DIAGNOSIS — H02839 Dermatochalasis of unspecified eye, unspecified eyelid: Secondary | ICD-10-CM | POA: Diagnosis not present

## 2013-01-20 DIAGNOSIS — H2510 Age-related nuclear cataract, unspecified eye: Secondary | ICD-10-CM | POA: Diagnosis not present

## 2013-01-20 DIAGNOSIS — H524 Presbyopia: Secondary | ICD-10-CM | POA: Diagnosis not present

## 2013-01-20 DIAGNOSIS — H43819 Vitreous degeneration, unspecified eye: Secondary | ICD-10-CM | POA: Diagnosis not present

## 2013-01-21 DIAGNOSIS — Z170 Estrogen receptor positive status [ER+]: Secondary | ICD-10-CM | POA: Diagnosis not present

## 2013-01-21 DIAGNOSIS — C50919 Malignant neoplasm of unspecified site of unspecified female breast: Secondary | ICD-10-CM | POA: Diagnosis not present

## 2013-01-31 DIAGNOSIS — C4441 Basal cell carcinoma of skin of scalp and neck: Secondary | ICD-10-CM | POA: Diagnosis not present

## 2013-01-31 DIAGNOSIS — L259 Unspecified contact dermatitis, unspecified cause: Secondary | ICD-10-CM | POA: Diagnosis not present

## 2013-01-31 DIAGNOSIS — L570 Actinic keratosis: Secondary | ICD-10-CM | POA: Diagnosis not present

## 2013-01-31 DIAGNOSIS — Z85828 Personal history of other malignant neoplasm of skin: Secondary | ICD-10-CM | POA: Diagnosis not present

## 2013-01-31 DIAGNOSIS — D485 Neoplasm of uncertain behavior of skin: Secondary | ICD-10-CM | POA: Diagnosis not present

## 2013-02-11 DIAGNOSIS — C4441 Basal cell carcinoma of skin of scalp and neck: Secondary | ICD-10-CM | POA: Diagnosis not present

## 2013-02-17 ENCOUNTER — Ambulatory Visit (HOSPITAL_BASED_OUTPATIENT_CLINIC_OR_DEPARTMENT_OTHER)
Admission: RE | Admit: 2013-02-17 | Discharge: 2013-02-17 | Disposition: A | Discharge status: Home / Self Care | Payer: Medicare Other | Source: Ambulatory Visit | Attending: Critical Care Medicine | Admitting: Critical Care Medicine

## 2013-02-17 ENCOUNTER — Ambulatory Visit (INDEPENDENT_AMBULATORY_CARE_PROVIDER_SITE_OTHER): Payer: Medicare Other | Admitting: Critical Care Medicine

## 2013-02-17 ENCOUNTER — Encounter: Payer: Self-pay | Admitting: Critical Care Medicine

## 2013-02-17 VITALS — BP 118/80 | HR 97 | Temp 98.2°F | Ht 65.5 in | Wt 114.5 lb

## 2013-02-17 DIAGNOSIS — J984 Other disorders of lung: Secondary | ICD-10-CM | POA: Diagnosis not present

## 2013-02-17 DIAGNOSIS — J209 Acute bronchitis, unspecified: Secondary | ICD-10-CM

## 2013-02-17 DIAGNOSIS — R059 Cough, unspecified: Secondary | ICD-10-CM | POA: Diagnosis not present

## 2013-02-17 DIAGNOSIS — R05 Cough: Secondary | ICD-10-CM | POA: Insufficient documentation

## 2013-02-17 DIAGNOSIS — J40 Bronchitis, not specified as acute or chronic: Secondary | ICD-10-CM | POA: Insufficient documentation

## 2013-02-17 DIAGNOSIS — J479 Bronchiectasis, uncomplicated: Secondary | ICD-10-CM

## 2013-02-17 DIAGNOSIS — J471 Bronchiectasis with (acute) exacerbation: Secondary | ICD-10-CM

## 2013-02-17 MED ORDER — AZITHROMYCIN 250 MG PO TABS: 250.0000 mg | ORAL_TABLET | Freq: Every day | ORAL | Status: DC

## 2013-02-17 MED ORDER — AZITHROMYCIN 250 MG PO TABS: 500.0000 mg | ORAL_TABLET | Freq: Every day | ORAL | Status: DC

## 2013-02-17 NOTE — Progress Notes (Addendum)
Subjective:    Patient ID: Earvin Hansen, female    DOB: February 22, 1946, 67 y.o.   MRN: [id number]  HPI  67 y.o.   with hx cough. Was thought to have scar tissue on CXR but then later felt to have bronchiectasis (abnormal CT scans as well). Had bronchoscopy in feb 2005 showing AFB but cx did not grow. then had endoscopy in May 2005 which grew MAI (S- clarithro, eth, RIF 8.0, synergy positive for E/R) and fortuitum (S- cipro/smikacin/tigecycline).  began antibiotics may 2005 and was treated with ETH/Azithro and cipro. she continued on anbiotics until March 2007. Did well until July 2007 when she had a CT scan which showed some "activation" of MAI.  She was restarted on her MAI therapy in Nov 2008. Last CT scan was 7-09 " 1. Stable mild lingular bronchiectasis. Stable tiny associated left lung nodules, which are consistent with a postinflammatory  etiology.   02/17/2013 Since the last visit September 2013 the patient developed breast cancer and for this has received radiotherapy to the right breast just finishing in February 2014. The patient notes since finishing radiation therapy she's had increasing cough or duct of yellow to green mucus. There is more dyspnea. She is more hoarse. She denies chest pain. She notes chills but no fever. There is no real wheezing. No edema in the feet. No heartburn or indigestion. The patient is not on any antibiotics at this time.   Past Medical History  Diagnosis Date  . COPD (chronic obstructive pulmonary disease)   . Hyperlipidemia   . Hypertension   . Pulmonary Mycobacterium avium complex (MAC) infection   . Bronchiectasis   . History of shingles   . GERD (gastroesophageal reflux disease)   . Fibromyalgia   . Migraine   . Osteopenia   . Squamous cell skin cancer, multiple sites   . DVT, lower extremity     LLE  . Breast cancer 11/12/2012    Dx oct 2013 - [facility] - Invasive ductal carcinoma, s/o bilat mastectomy with + margin  - also  for XRT soon  . Chronic fatigue fibromyalgia syndrome 11/12/2012     Family History  Problem Relation Age of Onset  . Lung cancer Father   . Heart disease Other     grandparents  . Breast cancer Other   . Hyperlipidemia Other   . Hypertension Other   . Other Other     alcholism / addiction  . Colon polyps Father      History   Social History  . Marital Status: Married    Spouse Name: N/A    Number of Children: 3  . Years of Education: N/A   Occupational History  . previously part-time MD's office    Social History Main Topics  . Smoking status: Former Smoker -- 1.00 packs/day for 25 years    Types: Cigarettes    Quit date: 12/11/1985  . Smokeless tobacco: Never Used  . Alcohol Use: No     Comment: occasional  . Drug Use: No  . Sexually Active: Not Currently   Other Topics Concern  . Not on file   Social History Narrative   Has not worked since apprx 1990     Allergies  Allergen Reactions  . Losartan     headache  . Ciprofloxacin     REACTION: tendinitis  . Clarithromycin     REACTION: severe reflux  . Prednisone     REACTION: irregular heartbeat, not able to  sleep     Outpatient Prescriptions Prior to Visit  Medication Sig Dispense Refill  . amLODipine (NORVASC) 5 MG tablet Take 1 tablet (5 mg total) by mouth daily.  90 tablet  3  . aspirin 81 MG EC tablet Take 81 mg by mouth daily.        . calcium citrate-vitamin D (CITRACAL+D) 315-200 MG-UNIT per tablet Take 1 tablet by mouth 2 (two) times daily.      Marland Kitchen DEXILANT 60 MG capsule Take 1 capsule by mouth daily.      . irbesartan (AVAPRO) 300 MG tablet Take 1 tablet (300 mg total) by mouth daily.  90 tablet  3  . simvastatin (ZOCOR) 40 MG tablet 1/2 tablet daily  90 tablet  1  . temazepam (RESTORIL) 15 MG capsule 1-2 tab by mouth at night for sleep  60 capsule  5  . carisoprodol (SOMA) 350 MG tablet Take 1 tablet (350 mg total) by mouth 2 (two) times daily as needed for muscle spasms.  180 tablet  1  .  PREMARIN vaginal cream Once a week.       No facility-administered medications prior to visit.     Review of Systems  Constitutional:   No  weight loss, night sweats,  Fevers, occ  Chills,has  fatigue, lassitude. HEENT:   No headaches,  Difficulty swallowing,  Tooth/dental problems,  Sore throat,                No sneezing, itching, ear ache, nasal congestion, occ  post nasal drip,   CV:  No chest pain,  Orthopnea, PND, swelling in lower extremities, anasarca, dizziness, palpitations  GI  No heartburn, indigestion, abdominal pain, nausea, vomiting, diarrhea, change in bowel habits, loss of appetite  Resp: Notes  shortness of breath with exertion not at rest.  Notes  excess mucus, notes  productive cough,  Notes a  non-productive cough,  No coughing up of blood.  Notes  change in color of mucus.  No wheezing.  No chest wall deformity  Skin: no rash or lesions.  GU: no dysuria, change in color of urine, no urgency or frequency.  No flank pain.  MS:  No joint pain or swelling.  No decreased range of motion.  No back pain.  Psych:  No change in mood or affect. No depression or anxiety.  No memory loss.     Objective:   Physical Exam BP 118/80  Pulse 97  Temp(Src) 98.2 F (36.8 C) (Oral)  Ht 5' 5.5" (1.664 m)  Wt 51.937 kg (114 lb 8 oz)  BMI 18.76 kg/m2  SpO2 99%  Gen: Pleasant, well-nourished, in no distress,  normal affect  ENT: No lesions,  mouth clear,  oropharynx clear, no postnasal drip  Neck: No JVD, no TMG, no carotid bruits  Lungs: No use of accessory muscles, no dullness to percussion, distant BS, exp wheeze RUL and LUL areas   Cardiovascular: RRR, heart sounds normal, no murmur or gallops, no peripheral edema  Abdomen: soft and NT, no HSM,  BS normal  Musculoskeletal: No deformities, no cyanosis or clubbing  Neuro: alert, non focal  Skin: Warm, no lesions or rashes      PFT Conversion 11/24/2010  FVC 2.99  FVC PREDICT 3.06  FVC  % Predicted 97  FEV1  2.26  FEV1 PREDICT 2.23  FEV % Predicted 101  FEV1/FVC 75.6  FEV1/FVC PRE 72  FeF 25-75 1.87  FeF 25-75 % Predicted 2.5  FEF % EXPEC  74   Dg Chest 2 View  02/17/2013  *RADIOLOGY REPORT*  Clinical Data: Cough.  Bronchitis.  History of Mycobacterium avium infection.  History of breast cancer.  CHEST - 2 VIEW  Comparison: Chest x-ray dated 01/11/2007 and chest CT scan dated 10/20/2010  Findings: There is a new area of ill-defined infiltrate in the right middle lobe.  There are small patchy areas of density in the lingula of the left upper lobe.  These are chronic findings.  Heart size and vascularity are normal.  No effusions.  Bilateral mastectomies since the prior exam.  No osseous abnormality.  IMPRESSION: New patchy area of infiltrate in the right middle lobe.  Chronic changes in the lingula.   Original Report Authenticated By: Francene Boyers, M.D.     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 amLODipine (NORVASC) 5 MG tablet Take 1 tablet (5 mg total) by mouth daily.  90 tablet  3  . aspirin 81 MG EC tablet Take 81 mg by mouth daily.        . calcium citrate-vitamin D (CITRACAL+D) 315-200 MG-UNIT per tablet Take 1 tablet by mouth 2 (two) times daily.      . carisoprodol (SOMA) 350 MG tablet Take 350 mg by mouth daily.      Marland Kitchen DEXILANT 60 MG capsule Take 1 capsule by mouth daily.      . irbesartan (AVAPRO) 300 MG tablet Take 1 tablet (300 mg total) by mouth daily.  90 tablet  3  .  letrozole (FEMARA) 2.5 MG tablet Take 2.5 mg by mouth daily.      . Methylcellulose, Laxative, (CITRUCEL PO) Take 1 tablet by mouth daily.      . simvastatin (ZOCOR) 40 MG tablet 1/2 tablet daily  90 tablet  1  . temazepam (RESTORIL) 15 MG capsule 1-2 tab by mouth at night for sleep  60 capsule  5  . [DISCONTINUED] carisoprodol (SOMA) 350 MG tablet Take 1 tablet (350 mg total) by mouth 2 (two) times daily as needed for muscle spasms.  180 tablet  1  . azithromycin (ZITHROMAX) 250 MG tablet Take 2 tablets (500 mg total) by mouth daily. Take two once then one daily until gone  4 each  0  . [DISCONTINUED] azithromycin (ZITHROMAX) 250 MG tablet Take 1 tablet (250 mg total) by mouth daily. Take two once then one daily until gone  6 each  0  . [DISCONTINUED] PREMARIN vaginal cream Once a week.       No facility-administered encounter medications on file as of 02/17/2013.

## 2013-02-17 NOTE — Patient Instructions (Addendum)
Azithromycin 250mg  Take two daily until gone  For 5 days A chest xray will be taken, we will call results Ok to use honey/lemon juice for cough suppression, also try a sugar free candy drop to keep in mouth, focus on swallow and not clearing the throat or coughing Return 4 months, sooner if unimproved

## 2013-02-17 NOTE — Assessment & Plan Note (Addendum)
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

## 2013-02-17 NOTE — Addendum Note (Signed)
Addended by: Shan Levans E on: 02/17/2013 02:19 PM   Modules accepted: Orders

## 2013-02-19 DIAGNOSIS — C50919 Malignant neoplasm of unspecified site of unspecified female breast: Secondary | ICD-10-CM | POA: Diagnosis not present

## 2013-02-28 DIAGNOSIS — J449 Chronic obstructive pulmonary disease, unspecified: Secondary | ICD-10-CM | POA: Diagnosis not present

## 2013-02-28 DIAGNOSIS — E7800 Pure hypercholesterolemia, unspecified: Secondary | ICD-10-CM | POA: Diagnosis not present

## 2013-02-28 DIAGNOSIS — R5382 Chronic fatigue, unspecified: Secondary | ICD-10-CM | POA: Diagnosis not present

## 2013-02-28 DIAGNOSIS — I10 Essential (primary) hypertension: Secondary | ICD-10-CM | POA: Diagnosis not present

## 2013-02-28 DIAGNOSIS — Z808 Family history of malignant neoplasm of other organs or systems: Secondary | ICD-10-CM | POA: Diagnosis not present

## 2013-02-28 DIAGNOSIS — Z09 Encounter for follow-up examination after completed treatment for conditions other than malignant neoplasm: Secondary | ICD-10-CM | POA: Diagnosis not present

## 2013-02-28 DIAGNOSIS — Z87891 Personal history of nicotine dependence: Secondary | ICD-10-CM | POA: Diagnosis not present

## 2013-02-28 DIAGNOSIS — C50919 Malignant neoplasm of unspecified site of unspecified female breast: Secondary | ICD-10-CM | POA: Diagnosis not present

## 2013-03-13 DIAGNOSIS — D692 Other nonthrombocytopenic purpura: Secondary | ICD-10-CM | POA: Diagnosis not present

## 2013-04-01 DIAGNOSIS — R300 Dysuria: Secondary | ICD-10-CM | POA: Diagnosis not present

## 2013-04-01 DIAGNOSIS — Z170 Estrogen receptor positive status [ER+]: Secondary | ICD-10-CM | POA: Diagnosis not present

## 2013-04-01 DIAGNOSIS — M949 Disorder of cartilage, unspecified: Secondary | ICD-10-CM | POA: Diagnosis not present

## 2013-04-01 DIAGNOSIS — C50919 Malignant neoplasm of unspecified site of unspecified female breast: Secondary | ICD-10-CM | POA: Diagnosis not present

## 2013-04-01 DIAGNOSIS — M899 Disorder of bone, unspecified: Secondary | ICD-10-CM | POA: Diagnosis not present

## 2013-04-01 DIAGNOSIS — Z5181 Encounter for therapeutic drug level monitoring: Secondary | ICD-10-CM | POA: Diagnosis not present

## 2013-04-01 DIAGNOSIS — Z79811 Long term (current) use of aromatase inhibitors: Secondary | ICD-10-CM | POA: Diagnosis not present

## 2013-04-08 DIAGNOSIS — H903 Sensorineural hearing loss, bilateral: Secondary | ICD-10-CM | POA: Diagnosis not present

## 2013-04-08 DIAGNOSIS — H905 Unspecified sensorineural hearing loss: Secondary | ICD-10-CM | POA: Diagnosis not present

## 2013-04-14 DIAGNOSIS — Z780 Asymptomatic menopausal state: Secondary | ICD-10-CM | POA: Diagnosis not present

## 2013-04-14 DIAGNOSIS — M810 Age-related osteoporosis without current pathological fracture: Secondary | ICD-10-CM | POA: Diagnosis not present

## 2013-04-23 ENCOUNTER — Other Ambulatory Visit: Payer: Self-pay | Admitting: Internal Medicine

## 2013-06-19 ENCOUNTER — Encounter: Payer: Self-pay | Admitting: Critical Care Medicine

## 2013-06-19 ENCOUNTER — Ambulatory Visit (HOSPITAL_BASED_OUTPATIENT_CLINIC_OR_DEPARTMENT_OTHER)
Admission: RE | Admit: 2013-06-19 | Discharge: 2013-06-19 | Disposition: A | Discharge status: Home / Self Care | Payer: Medicare Other | Source: Ambulatory Visit | Attending: Critical Care Medicine | Admitting: Critical Care Medicine

## 2013-06-19 ENCOUNTER — Ambulatory Visit (INDEPENDENT_AMBULATORY_CARE_PROVIDER_SITE_OTHER): Payer: Medicare Other | Admitting: Critical Care Medicine

## 2013-06-19 VITALS — BP 130/82 | HR 88 | Temp 98.6°F | Ht 65.5 in | Wt 114.0 lb

## 2013-06-19 DIAGNOSIS — A310 Pulmonary mycobacterial infection: Secondary | ICD-10-CM

## 2013-06-19 DIAGNOSIS — J479 Bronchiectasis, uncomplicated: Secondary | ICD-10-CM | POA: Insufficient documentation

## 2013-06-19 DIAGNOSIS — J471 Bronchiectasis with (acute) exacerbation: Secondary | ICD-10-CM

## 2013-06-19 NOTE — Patient Instructions (Addendum)
No change in medications CT Chest today I will review recent labs

## 2013-06-19 NOTE — Assessment & Plan Note (Signed)
Patient with progressive symptoms compatible with progressive Mycobacterium infection in the lung with associated bronchiectasis. CT scan the chest dated 06/19/2013 shows progression in bronchiectasis and tree in bud pattern in both lower lung zones  Plan  Would recommend 3 drug therapy for this patient on daily basis to include Cipro, ETH, Azithromycin, will discuss further with the pt

## 2013-06-19 NOTE — Progress Notes (Signed)
Subjective:    Patient ID: Diane Haney, female    DOB: 11/28/1946, 67 y.o.   MRN: [id number]  HPI  68 y.o.   with hx cough. Was thought to have scar tissue on CXR but then later felt to have bronchiectasis (abnormal CT scans as well). Had bronchoscopy in feb 2005 showing AFB but cx did not grow. then had endoscopy in May 2005 which grew MAI (S- clarithro, eth, RIF 8.0, synergy positive for E/R) and fortuitum (S- cipro/smikacin/tigecycline).  began antibiotics may 2005 and was treated with ETH/Azithro and cipro. she continued on anbiotics until March 2007. Did well until July 2007 when she had a CT scan which showed some "activation" of MAI.  She was restarted on her MAI therapy in Nov 2008. Last CT scan was 7-09 " 1. Stable mild lingular bronchiectasis. Stable tiny associated left lung nodules, which are consistent with a postinflammatory  etiology.   02/17/2013 Since the last visit September 2013 the patient developed breast cancer and for this has received radiotherapy to the right breast just finishing in February 2014. The patient notes since finishing radiation therapy she's had increasing cough or duct of yellow to green mucus. There is more dyspnea. She is more hoarse. She denies chest pain. She notes chills but no fever. There is no real wheezing. No edema in the feet. No heartburn or indigestion. The patient is not on any antibiotics at this time.  06/19/2013 Chief Complaint  Patient presents with  . 4 month follow up    increased fatigue over the past few months, SOB when talking or walking, nonprod cough, and chills  No wheezing or fever.   Pt notices more fatigue over past few months.  Pt notes more dyspnea, not as much stamina.  Notes some coughing spells per day. Cough is deep, no mucus.  No blood. Feels like mucus is down there.  Pt feels cold, no chills. No fever.  Weight is stable. Appt ok.  Notes some back and chest pain from mastectomies. Pt denies any significant sore  throat, nasal congestion or excess secretions, fever, chills, sweats, unintended weight loss, pleurtic or exertional chest pain, orthopnea PND, or leg swelling Pt denies any increase in rescue therapy over baseline, denies waking up needing it or having any early am or nocturnal exacerbations of coughing/wheezing/or dyspnea. Pt also denies any obvious fluctuation in symptoms with  weather or environmental change or other alleviating or aggravating factors  Notes some edema at ankles  Past Medical History  Diagnosis Date  . COPD (chronic obstructive pulmonary disease)   . Hyperlipidemia   . Hypertension   . Pulmonary Mycobacterium avium complex (MAC) infection   . Bronchiectasis   . History of shingles   . GERD (gastroesophageal reflux disease)   . Fibromyalgia   . Migraine   . Osteopenia   . Squamous cell skin cancer, multiple sites   . DVT, lower extremity     LLE  . Breast cancer 11/12/2012    Dx oct 2013 - [facility] - Invasive ductal carcinoma, s/o bilat mastectomy with + margin  - also for XRT soon  . Chronic fatigue fibromyalgia syndrome 11/12/2012     Family History  Problem Relation Age of Onset  . Lung cancer Father   . Heart disease Other     grandparents  . Breast cancer Other   . Hyperlipidemia Other   . Hypertension Other   . Other Other     alcholism / addiction  .  Colon polyps Father      History   Social History  . Marital Status: Married    Spouse Name: N/A    Number of Children: 3  . Years of Education: N/A   Occupational History  . previously part-time MD's office    Social History Main Topics  . Smoking status: Former Smoker -- 1.00 packs/day for 25 years    Types: Cigarettes    Quit date: 12/11/1985  . Smokeless tobacco: Never Used  . Alcohol Use: No     Comment: occasional  . Drug Use: No  . Sexually Active: Not Currently   Other Topics Concern  . Not on file   Social History Narrative   Has not worked since apprx  1990     Allergies  Allergen Reactions  . Losartan     headache  . Ciprofloxacin     REACTION: tendinitis  . Clarithromycin     REACTION: severe reflux  . Prednisone     REACTION: irregular heartbeat, not able to sleep     Outpatient Prescriptions Prior to Visit  Medication Sig Dispense Refill  . amLODipine (NORVASC) 5 MG tablet TAKE 1 TABLET BY MOUTH DAILY  90 tablet  1  . aspirin 81 MG EC tablet Take 81 mg by mouth daily.        . calcium citrate-vitamin D (CITRACAL+D) 315-200 MG-UNIT per tablet Take 1 tablet by mouth 2 (two) times daily.      . carisoprodol (SOMA) 350 MG tablet Take 350 mg by mouth daily.      Marland Kitchen DEXILANT 60 MG capsule Take 1 capsule by mouth daily.      . irbesartan (AVAPRO) 300 MG tablet Take 1 tablet (300 mg total) by mouth daily.  90 tablet  3  . letrozole (FEMARA) 2.5 MG tablet Take 2.5 mg by mouth daily.      . Methylcellulose, Laxative, (CITRUCEL PO) Take 1 tablet by mouth daily.      . simvastatin (ZOCOR) 40 MG tablet 1/2 tablet daily  90 tablet  1  . temazepam (RESTORIL) 15 MG capsule 1-2 tab by mouth at night for sleep  60 capsule  5  . azithromycin (ZITHROMAX) 250 MG tablet Take 2 tablets (500 mg total) by mouth daily. Take two once then one daily until gone  4 each  0   No facility-administered medications prior to visit.     Review of Systems  Constitutional:   No  weight loss, night sweats,  Fevers, occ  Chills,has  fatigue, lassitude. HEENT:   No headaches,  Difficulty swallowing,  Tooth/dental problems,  Sore throat,                No sneezing, itching, ear ache, nasal congestion, occ  post nasal drip,   CV:  No chest pain,  Orthopnea, PND, swelling in lower extremities, anasarca, dizziness, palpitations  GI  No heartburn, indigestion, abdominal pain, nausea, vomiting, diarrhea, change in bowel habits, loss of appetite  Resp: Notes  shortness of breath with exertion not at rest.  Notes  excess mucus, notes  productive cough,  Notes a   non-productive cough,  No coughing up of blood.  Notes  change in color of mucus.  No wheezing.  No chest wall deformity  Skin: no rash or lesions.  GU: no dysuria, change in color of urine, no urgency or frequency.  No flank pain.  MS:  No joint pain or swelling.  No decreased range of motion.  No back pain.  Psych:  No change in mood or affect. No depression or anxiety.  No memory loss.     Objective:   Physical Exam BP 130/82  Pulse 88  Temp(Src) 98.6 F (37 C) (Oral)  Ht 5' 5.5" (1.664 m)  Wt 114 lb (51.71 kg)  BMI 18.68 kg/m2  SpO2 98%  Gen: Pleasant, well-nourished, in no distress,  normal affect  ENT: No lesions,  mouth clear,  oropharynx clear, no postnasal drip  Neck: No JVD, no TMG, no carotid bruits  Lungs: No use of accessory muscles, no dullness to percussion, distant BS, exp wheeze RUL and LUL areas   Cardiovascular: RRR, heart sounds normal, no murmur or gallops, no peripheral edema  Abdomen: soft and NT, no HSM,  BS normal  Musculoskeletal: No deformities, no cyanosis or clubbing  Neuro: alert, non focal  Skin: Warm, no lesions or rashes      PFT Conversion 11/24/2010  FVC 2.99  FVC PREDICT 3.06  FVC  % Predicted 97  FEV1 2.26  FEV1 PREDICT 2.23  FEV % Predicted 101  FEV1/FVC 75.6  FEV1/FVC PRE 72  FeF 25-75 1.87  FeF 25-75 % Predicted 2.5  FEF % EXPEC 74   Ct Chest Wo Contrast  06/19/2013   *RADIOLOGY REPORT*  Clinical Data: Follow up MAC/ bronchiectasis.  CT CHEST WITHOUT CONTRAST  Technique:  Multidetector CT imaging of the chest was performed following the standard protocol without IV contrast.  Comparison: 10/20/2010  Findings:  There is no pleural effusion identified.  No airspace consolidation.  Bronchiectasis is identified within the right upper lobe, lingula and right middle lobe. Multi focal clustered areas of peripheral tree in bud nodularity are again noted involving both lungs and appear associated with areas of bronchiectasis.  Compared with previous exam there is been progression of disease within the right middle lobe and peripheral right upper lobe. Several scattered areas of peripheral nodularity are identified in the right lower lobe and are new from previous exam.  Stable tree of nodularity within the posterior left lower lobe, image 39/series 3.  The trachea appears patent and is midline.  Normal heart size.  No pericardial effusion.  There is no mediastinal or hilar adenopathy identified.  No acute findings within the upper abdomen.  Fluid attenuating structure within the right hepatic lobe measures 1.7 cm, image number 62/series 2.  The adrenal glands both appear normal.  Review of the visualized osseous structures is unremarkable.  No aggressive lytic or sclerotic bone lesions identified.  IMPRESSION:  1.  No acute findings. 2.  Bilateral areas of bronchiectasis and peripheral tree in bud nodularity are identified.  Findings are consistent with indolent atypical infection such as MAI.  When compared with the previous exam there is been mild disease progression throughout the right lung.   Original Report Authenticated By: Signa Kell, M.D.    Assessment & Plan:   Pulmonary diseases due to other mycobacteria Patient with progressive symptoms compatible with progressive Mycobacterium infection in the lung with associated bronchiectasis. CT scan the chest dated 06/19/2013 shows progression in bronchiectasis and tree in bud pattern in both lower lung zones  Plan  Would recommend 3 drug therapy for this patient on daily basis to include Cipro, ETH, Azithromycin, will discuss further with the pt    Updated Medication List Outpatient Encounter Prescriptions as of 06/19/2013  Medication Sig Dispense Refill  . amLODipine (NORVASC) 5 MG tablet TAKE 1 TABLET BY MOUTH DAILY  90 tablet  1  . aspirin 81 MG EC tablet Take 81 mg by mouth daily.        . calcium citrate-vitamin D (CITRACAL+D) 315-200 MG-UNIT per tablet Take 1  tablet by mouth 2 (two) times daily.      . carisoprodol (SOMA) 350 MG tablet Take 350 mg by mouth daily.      Marland Kitchen DEXILANT 60 MG capsule Take 1 capsule by mouth daily.      . irbesartan (AVAPRO) 300 MG tablet Take 1 tablet (300 mg total) by mouth daily.  90 tablet  3  . letrozole (FEMARA) 2.5 MG tablet Take 2.5 mg by mouth daily.      . Methylcellulose, Laxative, (CITRUCEL PO) Take 1 tablet by mouth daily.      . simvastatin (ZOCOR) 40 MG tablet 1/2 tablet daily  90 tablet  1  . temazepam (RESTORIL) 15 MG capsule 1-2 tab by mouth at night for sleep  60 capsule  5  . [DISCONTINUED] azithromycin (ZITHROMAX) 250 MG tablet Take 2 tablets (500 mg total) by mouth daily. Take two once then one daily until gone  4 each  0   No facility-administered encounter medications on file as of 06/19/2013.

## 2013-06-20 ENCOUNTER — Telehealth: Payer: Self-pay | Admitting: Critical Care Medicine

## 2013-06-20 DIAGNOSIS — A310 Pulmonary mycobacterial infection: Secondary | ICD-10-CM

## 2013-06-20 NOTE — Telephone Encounter (Signed)
Dr. Delford Field the pt returned your call, her contact number is the same. She states she will not be available after 12:00 today. Carron Curie, CMA

## 2013-06-20 NOTE — Telephone Encounter (Signed)
Pt knows the results Pt needs to be referred back to [facility] of Infectious Diseases  For revaluation of her MAC lung infection If he wants a repeat FOB i will be glad to do this

## 2013-06-20 NOTE — Telephone Encounter (Signed)
Order placed to refer to Dr. Ninetta Lights. Carron Curie, CMA

## 2013-06-26 ENCOUNTER — Telehealth: Payer: Self-pay | Admitting: Critical Care Medicine

## 2013-06-26 NOTE — Telephone Encounter (Signed)
Rec'd from [facility] hematology / Oncology forward 35 pages to Dr.Wright

## 2013-07-03 DIAGNOSIS — H43819 Vitreous degeneration, unspecified eye: Secondary | ICD-10-CM | POA: Diagnosis not present

## 2013-07-04 DIAGNOSIS — C50519 Malignant neoplasm of lower-outer quadrant of unspecified female breast: Secondary | ICD-10-CM | POA: Diagnosis not present

## 2013-07-04 DIAGNOSIS — Z87891 Personal history of nicotine dependence: Secondary | ICD-10-CM | POA: Diagnosis not present

## 2013-07-04 DIAGNOSIS — I10 Essential (primary) hypertension: Secondary | ICD-10-CM | POA: Diagnosis not present

## 2013-07-04 DIAGNOSIS — R5382 Chronic fatigue, unspecified: Secondary | ICD-10-CM | POA: Diagnosis not present

## 2013-07-04 DIAGNOSIS — J449 Chronic obstructive pulmonary disease, unspecified: Secondary | ICD-10-CM | POA: Diagnosis not present

## 2013-07-04 DIAGNOSIS — Z808 Family history of malignant neoplasm of other organs or systems: Secondary | ICD-10-CM | POA: Diagnosis not present

## 2013-07-04 DIAGNOSIS — IMO0001 Reserved for inherently not codable concepts without codable children: Secondary | ICD-10-CM | POA: Diagnosis not present

## 2013-07-04 DIAGNOSIS — Z09 Encounter for follow-up examination after completed treatment for conditions other than malignant neoplasm: Secondary | ICD-10-CM | POA: Diagnosis not present

## 2013-07-04 DIAGNOSIS — E7800 Pure hypercholesterolemia, unspecified: Secondary | ICD-10-CM | POA: Diagnosis not present

## 2013-07-04 DIAGNOSIS — K219 Gastro-esophageal reflux disease without esophagitis: Secondary | ICD-10-CM | POA: Diagnosis not present

## 2013-07-07 ENCOUNTER — Ambulatory Visit: Payer: Medicare Other | Admitting: Internal Medicine

## 2013-07-17 ENCOUNTER — Other Ambulatory Visit: Payer: Self-pay | Admitting: Internal Medicine

## 2013-07-21 ENCOUNTER — Ambulatory Visit (INDEPENDENT_AMBULATORY_CARE_PROVIDER_SITE_OTHER): Payer: Medicare Other | Admitting: Infectious Diseases

## 2013-07-21 ENCOUNTER — Encounter: Payer: Self-pay | Admitting: Infectious Diseases

## 2013-07-21 VITALS — BP 152/76 | HR 102 | Temp 97.9°F | Ht 65.5 in | Wt 114.0 lb

## 2013-07-21 DIAGNOSIS — A310 Pulmonary mycobacterial infection: Secondary | ICD-10-CM

## 2013-07-21 DIAGNOSIS — C50919 Malignant neoplasm of unspecified site of unspecified female breast: Secondary | ICD-10-CM

## 2013-07-21 MED ORDER — ETHAMBUTOL HCL 400 MG PO TABS: 400.0000 mg | ORAL_TABLET | ORAL | Status: DC

## 2013-07-21 MED ORDER — AZITHROMYCIN 500 MG PO TABS: 500.0000 mg | ORAL_TABLET | ORAL | Status: DC

## 2013-07-21 MED ORDER — RIFAMPIN 300 MG PO CAPS: 600.0000 mg | ORAL_CAPSULE | ORAL | Status: DC

## 2013-07-21 NOTE — Progress Notes (Signed)
  Subjective:    Patient ID: Diane Haney, female    DOB: Oct 08, 1946, 67 y.o.   MRN: [id number]  HPI 67 yo F with hx  endoscopy in May 2005 which grew MAI (S- clarithro, eth, RIF 8.0, synergy positive for E/R) and fortuitum (S- cipro/smikacin/tigecycline).She began antibiotics May 2005 and was treated with ETH/Azithro and cipro until March 2007. Did well until July 2007 when she had a CT scan which showed some "activation" of MAI.  She was restarted on her MAI therapy in Nov 2008. CT scan was 7-09 " 1. Stable mild lingular bronchiectasis. Stable tiny associated left lung nodules, which are consistent with a postinflammatory  etiology. 2. No active disease." Seen in ID November of 2009, was doing well and was taken off meds for MAI.  Seen November 2011 and felt like her "MAC is back". More coughing, no fevers, no chills, SOB has been at baseline. She had f/u CT 10-20-10: 1. New tree-in-bud opacities in the right middle lobe since the  prior CT from July, 2009, consistent with MAI. 2. Stable scarring, bronchiectasis, and tree-in-bud opacities in the inferior left upper lobe adjacent to the major fissure. Stable scar and bronchiectasis medially in the right middle lobe. No new pulmonary parenchymal abnormalities elsewhere.  3. Stable hyperinflation consistent with COPD and/or asthma. 4. No significant lymphadenopathy.  She was restarted on MAI rx- Azithro/ETH/RIf.  Last seen in ID in 2012. Since has been dx with breast cancer (fall of 2013) and has received XRT.  She has had more SOB and cough. As part of her f/u she underwent CT (06-19-13): Bilateral areas of bronchiectasis and peripheral tree in bud  nodularity are identified. Findings are consistent with indolent  atypical infection such as MAI. When compared with the previous  exam there is been mild disease progression throughout the right  lung.  Prev cough was productive of yellow-green, mostly is non-productive.  Wt was going down- about 10#.  Feels cold all the time, no chills, no fever.   Review of Systems Has DOE (speaking, walking, going up stairs).      Objectiv   Physical Exam  Constitutional: She appears well-developed and well-nourished.  HENT:  Mouth/Throat: No oropharyngeal exudate.  Eyes: EOM are normal. Pupils are equal, round, and reactive to light.  Neck: Neck supple.  Musculoskeletal: She exhibits no edema.  Lymphadenopathy:    She has no cervical adenopathy.    She has no axillary adenopathy.          Assessment & Plan:

## 2013-07-21 NOTE — Assessment & Plan Note (Addendum)
We agreed that she should go back on therapy. She is not encouraged by this as she has improved previously with the rx but then relapses. I counseled her that this is likely to be the course going forward. Will plan on 1 yr of therapy. She needs LFTs, I offered to do them here but will instead do them as scheduled at her oncologists office. Will see her back in 6 weeks, she is aware of ADRs of medications as she has been on them before (we again discussed them). I also discussed alternative rx's with her (zyvox, aminoglycosides) but will defer these due to toxicities. As well, we discussed getting a repeat Cx to see if her MAI has become resistant to her medicaitons- she has a non-productive cough and does not feel she can provide a sample. She believes she would need a BAL. At this point will defer BAL, persue id she does not improve. She will need to have ophtho exam in the next 3-4 months while on [facility].

## 2013-08-04 DIAGNOSIS — K219 Gastro-esophageal reflux disease without esophagitis: Secondary | ICD-10-CM | POA: Diagnosis not present

## 2013-08-04 DIAGNOSIS — R109 Unspecified abdominal pain: Secondary | ICD-10-CM | POA: Diagnosis not present

## 2013-08-04 DIAGNOSIS — R05 Cough: Secondary | ICD-10-CM | POA: Diagnosis not present

## 2013-08-04 DIAGNOSIS — C50919 Malignant neoplasm of unspecified site of unspecified female breast: Secondary | ICD-10-CM | POA: Diagnosis not present

## 2013-08-04 DIAGNOSIS — R1013 Epigastric pain: Secondary | ICD-10-CM | POA: Diagnosis not present

## 2013-08-04 DIAGNOSIS — Z170 Estrogen receptor positive status [ER+]: Secondary | ICD-10-CM | POA: Diagnosis not present

## 2013-08-18 DIAGNOSIS — C50919 Malignant neoplasm of unspecified site of unspecified female breast: Secondary | ICD-10-CM | POA: Diagnosis not present

## 2013-08-18 DIAGNOSIS — Z853 Personal history of malignant neoplasm of breast: Secondary | ICD-10-CM | POA: Diagnosis not present

## 2013-08-18 DIAGNOSIS — Z170 Estrogen receptor positive status [ER+]: Secondary | ICD-10-CM | POA: Diagnosis not present

## 2013-08-18 DIAGNOSIS — Z09 Encounter for follow-up examination after completed treatment for conditions other than malignant neoplasm: Secondary | ICD-10-CM | POA: Diagnosis not present

## 2013-08-18 DIAGNOSIS — M810 Age-related osteoporosis without current pathological fracture: Secondary | ICD-10-CM | POA: Diagnosis not present

## 2013-08-18 DIAGNOSIS — Z79811 Long term (current) use of aromatase inhibitors: Secondary | ICD-10-CM | POA: Diagnosis not present

## 2013-08-18 DIAGNOSIS — Z5181 Encounter for therapeutic drug level monitoring: Secondary | ICD-10-CM | POA: Diagnosis not present

## 2013-08-25 ENCOUNTER — Encounter: Payer: Self-pay | Admitting: Critical Care Medicine

## 2013-08-25 ENCOUNTER — Ambulatory Visit (INDEPENDENT_AMBULATORY_CARE_PROVIDER_SITE_OTHER): Payer: Medicare Other | Admitting: Critical Care Medicine

## 2013-08-25 VITALS — BP 128/70 | HR 84 | Temp 98.2°F | Ht 65.5 in | Wt 114.0 lb

## 2013-08-25 DIAGNOSIS — J479 Bronchiectasis, uncomplicated: Secondary | ICD-10-CM | POA: Diagnosis not present

## 2013-08-25 DIAGNOSIS — A310 Pulmonary mycobacterial infection: Secondary | ICD-10-CM

## 2013-08-25 DIAGNOSIS — Z23 Encounter for immunization: Secondary | ICD-10-CM | POA: Diagnosis not present

## 2013-08-25 NOTE — Assessment & Plan Note (Signed)
MAI, bronchiectasis flare Plan Cont MAI rx per ID

## 2013-08-25 NOTE — Progress Notes (Signed)
Subjective:    Patient ID: Diane Haney, female    DOB: Feb 17, 1946, 67 y.o.   MRN: [id number]  HPI  67 y.o.   with hx cough. Was thought to have scar tissue on CXR but then later felt to have bronchiectasis (abnormal CT scans as well). Had bronchoscopy in feb 2005 showing AFB but cx did not grow. then had endoscopy in May 2005 which grew MAI (S- clarithro, eth, RIF 8.0, synergy positive for E/R) and fortuitum (S- cipro/smikacin/tigecycline).  began antibiotics may 2005 and was treated with ETH/Azithro and cipro. she continued on anbiotics until March 2007. Did well until July 2007 when she had a CT scan which showed some "activation" of MAI.  She was restarted on her MAI therapy in Nov 2008. Last CT scan was 7-09 " 1. Stable mild lingular bronchiectasis. Stable tiny associated left lung nodules, which are consistent with a postinflammatory  etiology.   08/25/2013 Chief Complaint  Patient presents with  . 2 month follow up    Reports breathing is unchanged - does have DOE and nonprod cough.  No wheezing, chest tightness, or chest pain.  ID saw the pt and Rx three drug therapy.  Sl cough but is better. Dyspnea is the same.  No chest pains No f/c/s.    Past Medical History  Diagnosis Date  . COPD (chronic obstructive pulmonary disease)   . Hyperlipidemia   . Hypertension   . Pulmonary Mycobacterium avium complex (MAC) infection   . Bronchiectasis   . History of shingles   . GERD (gastroesophageal reflux disease)   . Fibromyalgia   . Migraine   . Osteopenia   . Squamous cell skin cancer, multiple sites   . DVT, lower extremity     LLE  . Breast cancer 11/12/2012    Dx oct 2013 - [facility] - Invasive ductal carcinoma, s/o bilat mastectomy with + margin  - also for XRT soon  . Chronic fatigue fibromyalgia syndrome 11/12/2012     Family History  Problem Relation Age of Onset  . Lung cancer Father   . Heart disease Other     grandparents  . Breast cancer Other    . Hyperlipidemia Other   . Hypertension Other   . Other Other     alcholism / addiction  . Colon polyps Father      History   Social History  . Marital Status: Married    Spouse Name: N/A    Number of Children: 3  . Years of Education: N/A   Occupational History  . previously part-time MD's office    Social History Main Topics  . Smoking status: Former Smoker -- 1.00 packs/day for 25 years    Types: Cigarettes    Quit date: 12/11/1985  . Smokeless tobacco: Never Used  . Alcohol Use: No     Comment: occasional  . Drug Use: No  . Sexual Activity: Not Currently   Other Topics Concern  . Not on file   Social History Narrative   Has not worked since apprx 1990     Allergies  Allergen Reactions  . Losartan     headache  . Adhesive [Tape]     Blisters  . Ciprofloxacin     REACTION: tendinitis  . Clarithromycin     REACTION: severe reflux  . Prednisone     REACTION: irregular heartbeat, not able to sleep     Outpatient Prescriptions Prior to Visit  Medication Sig Dispense Refill  .  amLODipine (NORVASC) 5 MG tablet TAKE 1 TABLET BY MOUTH DAILY  90 tablet  1  . aspirin 81 MG EC tablet Take 81 mg by mouth daily.        Marland Kitchen azithromycin (ZITHROMAX) 500 MG tablet Take 1 tablet (500 mg total) by mouth 3 (three) times a week.  30 tablet  3  . calcium citrate-vitamin D (CITRACAL+D) 315-200 MG-UNIT per tablet Take 1 tablet by mouth daily.       . carisoprodol (SOMA) 350 MG tablet Take 350 mg by mouth daily.      Marland Kitchen DEXILANT 60 MG capsule Take 1 capsule by mouth daily.      Marland Kitchen ethambutol (MYAMBUTOL) 400 MG tablet Take 1 tablet (400 mg total) by mouth 3 (three) times a week. Take three tabs three times weekly  60 tablet  3  . irbesartan (AVAPRO) 300 MG tablet TAKE 1 TABLET BY MOUTH DAILY  90 tablet  0  . letrozole (FEMARA) 2.5 MG tablet Take 2.5 mg by mouth daily.      . Methylcellulose, Laxative, (CITRUCEL PO) Take 1 tablet by mouth daily.      . rifampin (RIFADIN) 300 MG  capsule Take 2 capsules (600 mg total) by mouth 3 (three) times a week. Take 2 tablets by mouth three times weekly  60 capsule  3  . simvastatin (ZOCOR) 40 MG tablet 1/2 tablet daily  90 tablet  1  . temazepam (RESTORIL) 15 MG capsule 1-2 tab by mouth at night for sleep  60 capsule  5   No facility-administered medications prior to visit.     Review of Systems  Constitutional:   No  weight loss, night sweats,  Fevers, occ  Chills,has  fatigue, lassitude. HEENT:   No headaches,  Difficulty swallowing,  Tooth/dental problems,  Sore throat,                No sneezing, itching, ear ache, nasal congestion, occ  post nasal drip,   CV:  No chest pain,  Orthopnea, PND, swelling in lower extremities, anasarca, dizziness, palpitations  GI  No heartburn, indigestion, abdominal pain, nausea, vomiting, diarrhea, change in bowel habits, loss of appetite  Resp: Notes  shortness of breath with exertion not at rest.  Notes  excess mucus, notes  productive cough,  Notes a  non-productive cough,  No coughing up of blood.  Notes  change in color of mucus.  No wheezing.  No chest wall deformity  Skin: no rash or lesions.  GU: no dysuria, change in color of urine, no urgency or frequency.  No flank pain.  MS:  No joint pain or swelling.  No decreased range of motion.  No back pain.  Psych:  No change in mood or affect. No depression or anxiety.  No memory loss.     Objective:   Physical Exam BP 128/70  Pulse 84  Temp(Src) 98.2 F (36.8 C) (Oral)  Ht 5' 5.5" (1.664 m)  Wt 51.71 kg (114 lb)  BMI 18.68 kg/m2  SpO2 99%  Gen: Pleasant, well-nourished, in no distress,  normal affect  ENT: No lesions,  mouth clear,  oropharynx clear, no postnasal drip  Neck: No JVD, no TMG, no carotid bruits  Lungs: No use of accessory muscles, no dullness to percussion, distant BS, exp wheeze RUL and LUL areas   Cardiovascular: RRR, heart sounds normal, no murmur or gallops, no peripheral edema  Abdomen: soft  and NT, no HSM,  BS normal  Musculoskeletal: No  deformities, no cyanosis or clubbing  Neuro: alert, non focal  Skin: Warm, no lesions or rashes      PFT Conversion 11/24/2010  FVC 2.99  FVC PREDICT 3.06  FVC  % Predicted 97  FEV1 2.26  FEV1 PREDICT 2.23  FEV % Predicted 101  FEV1/FVC 75.6  FEV1/FVC PRE 72  FeF 25-75 1.87  FeF 25-75 % Predicted 2.5  FEF % EXPEC 74   No results found.  Assessment & Plan:   Pulmonary diseases due to other mycobacteria MAI, bronchiectasis flare Plan Cont MAI rx per ID     Updated Medication List Outpatient Encounter Prescriptions as of 08/25/2013  Medication Sig Dispense Refill  . amLODipine (NORVASC) 5 MG tablet TAKE 1 TABLET BY MOUTH DAILY  90 tablet  1  . aspirin 81 MG EC tablet Take 81 mg by mouth daily.        Marland Kitchen azithromycin (ZITHROMAX) 500 MG tablet Take 1 tablet (500 mg total) by mouth 3 (three) times a week.  30 tablet  3  . calcium citrate-vitamin D (CITRACAL+D) 315-200 MG-UNIT per tablet Take 1 tablet by mouth daily.       . carisoprodol (SOMA) 350 MG tablet Take 350 mg by mouth daily.      Marland Kitchen DEXILANT 60 MG capsule Take 1 capsule by mouth daily.      Marland Kitchen ethambutol (MYAMBUTOL) 400 MG tablet Take 1 tablet (400 mg total) by mouth 3 (three) times a week. Take three tabs three times weekly  60 tablet  3  . irbesartan (AVAPRO) 300 MG tablet TAKE 1 TABLET BY MOUTH DAILY  90 tablet  0  . letrozole (FEMARA) 2.5 MG tablet Take 2.5 mg by mouth daily.      . Methylcellulose, Laxative, (CITRUCEL PO) Take 1 tablet by mouth daily.      . rifampin (RIFADIN) 300 MG capsule Take 2 capsules (600 mg total) by mouth 3 (three) times a week. Take 2 tablets by mouth three times weekly  60 capsule  3  . simvastatin (ZOCOR) 40 MG tablet 1/2 tablet daily  90 tablet  1  . temazepam (RESTORIL) 15 MG capsule 1-2 tab by mouth at night for sleep  60 capsule  5   No facility-administered encounter medications on file as of 08/25/2013.

## 2013-08-25 NOTE — Patient Instructions (Addendum)
Flu vaccine was given No change in medications Return 3 months 

## 2013-08-28 DIAGNOSIS — Z85828 Personal history of other malignant neoplasm of skin: Secondary | ICD-10-CM | POA: Diagnosis not present

## 2013-08-28 DIAGNOSIS — L578 Other skin changes due to chronic exposure to nonionizing radiation: Secondary | ICD-10-CM | POA: Diagnosis not present

## 2013-08-28 DIAGNOSIS — D485 Neoplasm of uncertain behavior of skin: Secondary | ICD-10-CM | POA: Diagnosis not present

## 2013-08-28 DIAGNOSIS — I789 Disease of capillaries, unspecified: Secondary | ICD-10-CM | POA: Diagnosis not present

## 2013-08-28 DIAGNOSIS — L570 Actinic keratosis: Secondary | ICD-10-CM | POA: Diagnosis not present

## 2013-09-10 ENCOUNTER — Ambulatory Visit (INDEPENDENT_AMBULATORY_CARE_PROVIDER_SITE_OTHER): Payer: Medicare Other | Admitting: Infectious Diseases

## 2013-09-10 ENCOUNTER — Encounter: Payer: Self-pay | Admitting: Infectious Diseases

## 2013-09-10 VITALS — BP 138/73 | HR 93 | Temp 97.8°F | Ht 65.0 in | Wt 112.0 lb

## 2013-09-10 DIAGNOSIS — A310 Pulmonary mycobacterial infection: Secondary | ICD-10-CM

## 2013-09-10 NOTE — Assessment & Plan Note (Signed)
She has made some improvement. Let her know that is up to her how long she can try to tolerate medication, hopefully can get 1 year. She has gotten flu shot at Dr [employer] office. She had labs done at her PCP, states they were normal. Will repeat at her f/u visit in 4 months. She had ophtho eval (normal) prior to re-start her meds. I asked her to be seen again around her next visit.

## 2013-09-10 NOTE — Progress Notes (Signed)
  Subjective:    Patient ID: Diane Haney, female    DOB: 06-Jan-1946, 67 y.o.   MRN: [id number]  HPI 67 yo F with hx endoscopy in May 2005 which grew MAI (S- clarithro, eth, RIF 8.0, synergy positive for E/R) and fortuitum (S- cipro/smikacin/tigecycline).She began antibiotics May 2005 and was treated with ETH/Azithro and cipro until March 2007. Did well until July 2007 when she had a CT scan which showed some "activation" of MAI.  She was restarted on her MAI therapy in Nov 2008. CT scan was 7-09 " 1. Stable mild lingular bronchiectasis. Stable tiny associated left lung nodules, which are consistent with a postinflammatory  etiology. 2. No active disease." Seen in ID November of 2009, was doing well and was taken off meds for MAI.  Seen November 2011 and felt like her "MAC is back". More coughing, no fevers, no chills, SOB has been at baseline. She had f/u CT 10-20-10: 1. New tree-in-bud opacities in the right middle lobe since the  prior CT from July, 2009, consistent with MAI. She was restarted on MAI rx- Azithro/ETH/RIf.  Last seen in ID in 2012. Since has been dx with breast cancer (fall of 2013) and has received XRT.  She has had more SOB and cough. As part of her f/u she underwent CT (06-19-13): Bilateral areas of bronchiectasis and peripheral tree in bud  nodularity are identified. Findings are consistent with indolent  atypical infection such as MAI. When compared with the previous  exam there is been mild disease progression throughout the right  lung. She was restarted on MAI therapy August 2014.   States she has been feeling well- cough slightly decreased. Has had malaise, mildly increased. No fever, has chills which she attributes to her anti-htn. Has SOB, mostly DOE. No sputum production- feels like it gets caught in her throat. Mild nausea since back on medication.    Review of Systems  Constitutional: Positive for fatigue. Negative for appetite change.  Respiratory: Positive for  cough and shortness of breath.   Gastrointestinal: Positive for nausea.       Objective:   Physical Exam  Constitutional:    Eyes: EOM are normal. Pupils are equal, round, and reactive to light.  Neck: Neck supple.  Cardiovascular: Normal rate, regular rhythm and normal heart sounds.   Pulmonary/Chest: Effort normal. She has decreased breath sounds in the left lower field.  Abdominal: Soft. Bowel sounds are normal. There is no tenderness. There is no rebound.  Lymphadenopathy:    She has no cervical adenopathy.          Assessment & Plan:

## 2013-10-13 ENCOUNTER — Other Ambulatory Visit: Payer: Self-pay | Admitting: Internal Medicine

## 2013-10-13 DIAGNOSIS — C50919 Malignant neoplasm of unspecified site of unspecified female breast: Secondary | ICD-10-CM | POA: Diagnosis not present

## 2013-10-13 DIAGNOSIS — M810 Age-related osteoporosis without current pathological fracture: Secondary | ICD-10-CM | POA: Diagnosis not present

## 2013-10-14 DIAGNOSIS — R499 Unspecified voice and resonance disorder: Secondary | ICD-10-CM | POA: Diagnosis not present

## 2013-10-14 DIAGNOSIS — K219 Gastro-esophageal reflux disease without esophagitis: Secondary | ICD-10-CM | POA: Diagnosis not present

## 2013-11-14 ENCOUNTER — Other Ambulatory Visit (INDEPENDENT_AMBULATORY_CARE_PROVIDER_SITE_OTHER): Payer: Medicare Other

## 2013-11-14 ENCOUNTER — Encounter: Payer: Self-pay | Admitting: Internal Medicine

## 2013-11-14 ENCOUNTER — Ambulatory Visit (INDEPENDENT_AMBULATORY_CARE_PROVIDER_SITE_OTHER): Payer: Medicare Other | Admitting: Internal Medicine

## 2013-11-14 VITALS — BP 132/80 | HR 112 | Temp 97.1°F | Ht 65.5 in | Wt 114.2 lb

## 2013-11-14 DIAGNOSIS — E785 Hyperlipidemia, unspecified: Secondary | ICD-10-CM

## 2013-11-14 DIAGNOSIS — Z23 Encounter for immunization: Secondary | ICD-10-CM | POA: Diagnosis not present

## 2013-11-14 DIAGNOSIS — R7309 Other abnormal glucose: Secondary | ICD-10-CM | POA: Diagnosis not present

## 2013-11-14 DIAGNOSIS — Z136 Encounter for screening for cardiovascular disorders: Secondary | ICD-10-CM

## 2013-11-14 DIAGNOSIS — R7302 Impaired glucose tolerance (oral): Secondary | ICD-10-CM

## 2013-11-14 DIAGNOSIS — I10 Essential (primary) hypertension: Secondary | ICD-10-CM

## 2013-11-14 DIAGNOSIS — R5382 Chronic fatigue, unspecified: Secondary | ICD-10-CM

## 2013-11-14 DIAGNOSIS — M797 Fibromyalgia: Secondary | ICD-10-CM

## 2013-11-14 HISTORY — DX: Impaired glucose tolerance (oral): R73.02

## 2013-11-14 LAB — BASIC METABOLIC PANEL
BUN: 15 mg/dL (ref 6–23)
Calcium: 9.6 mg/dL (ref 8.4–10.5)
Creatinine, Ser: 0.8 mg/dL (ref 0.4–1.2)
GFR: 72.63 mL/min (ref >60.00)
Potassium: 4.4 mEq/L (ref 3.5–5.1)

## 2013-11-14 LAB — CBC WITH DIFFERENTIAL/PLATELET
Basophils Relative: 0.4 % (ref 0.0–3.0)
Eosinophils Relative: 0.3 % (ref 0.0–5.0)
HCT: 43.3 % (ref 36.0–46.0)
Hemoglobin: 14.7 g/dL (ref 12.0–15.0)
Lymphs Abs: 0.7 10*3/uL (ref 0.7–4.0)
MCV: 94.6 fl (ref 78.0–100.0)
Monocytes Absolute: 0.8 10*3/uL (ref 0.1–1.0)
Monocytes Relative: 11.1 % (ref 3.0–12.0)
Neutro Abs: 5.5 10*3/uL (ref 1.4–7.7)
RBC: 4.58 Mil/uL (ref 3.87–5.11)
WBC: 7 10*3/uL (ref 4.5–10.5)

## 2013-11-14 LAB — HEPATIC FUNCTION PANEL
AST: 19 U/L (ref 0–37)
Total Bilirubin: 0.4 mg/dL (ref 0.3–1.2)

## 2013-11-14 LAB — URINALYSIS, ROUTINE W REFLEX MICROSCOPIC
Bilirubin Urine: NEGATIVE
Ketones, ur: NEGATIVE
Nitrite: NEGATIVE
Specific Gravity, Urine: >=1.03 (ref 1.000–1.030)
Total Protein, Urine: NEGATIVE
pH: 6 (ref 5.0–8.0)

## 2013-11-14 LAB — LIPID PANEL
HDL: 88.7 mg/dL (ref >39.00)
Triglycerides: 65 mg/dL (ref 0.0–149.0)
VLDL: 13 mg/dL (ref 0.0–40.0)

## 2013-11-14 LAB — HEMOGLOBIN A1C: Hgb A1c MFr Bld: 5.9 % (ref 4.6–6.5)

## 2013-11-14 MED ORDER — AMLODIPINE BESYLATE 5 MG PO TABS: 5.0000 mg | ORAL_TABLET | Freq: Every day | ORAL | Status: DC

## 2013-11-14 MED ORDER — CARISOPRODOL 350 MG PO TABS: 350.0000 mg | ORAL_TABLET | Freq: Every day | ORAL | Status: DC

## 2013-11-14 MED ORDER — IRBESARTAN 300 MG PO TABS: 300.0000 mg | ORAL_TABLET | Freq: Every day | ORAL | Status: DC

## 2013-11-14 MED ORDER — TEMAZEPAM 15 MG PO CAPS: ORAL_CAPSULE | ORAL | Status: DC

## 2013-11-14 MED ORDER — SIMVASTATIN 40 MG PO TABS: ORAL_TABLET | ORAL | Status: DC

## 2013-11-14 NOTE — Assessment & Plan Note (Signed)
stable overall by history and exam, recent data reviewed with pt, and pt to continue medical treatment as before,  to f/u any worsening symptoms or concerns Lab Results  Component Value Date   WBC 8.1 11/12/2012   HGB 14.4 11/12/2012   HCT 42.9 11/12/2012   PLT 244.0 11/12/2012   GLUCOSE 118* 11/12/2012   CHOL 189 11/12/2012   TRIG 46.0 11/12/2012   HDL 80.50 11/12/2012   LDLDIRECT 89.0 11/08/2011   LDLCALC 99 11/12/2012   ALT 23 11/12/2012   AST 26 11/12/2012   NA 141 11/12/2012   K 5.5* 11/12/2012   CL 103 11/12/2012   CREATININE 0.9 11/12/2012   BUN 23 11/12/2012   CO2 31 11/12/2012   TSH 1.64 11/12/2012   HGBA1C 6.2 11/13/2012    Note:  Total time for pt hx, exam, review of record with pt in the room, determination of diagnoses and plan for further eval and tx is > 40 min, with over 50% spent in coordination and counseling of patient

## 2013-11-14 NOTE — Addendum Note (Signed)
Addended by: Scharlene Gloss B on: 11/14/2013 10:15 AM   Modules accepted: Orders

## 2013-11-14 NOTE — Assessment & Plan Note (Signed)
stable overall by history and exam, recent data reviewed with pt, and pt to continue medical treatment as before,  to f/u any worsening symptoms or concerns Lab Results  Component Value Date   HGBA1C 6.2 11/13/2012   For f/u lab today

## 2013-11-14 NOTE — Assessment & Plan Note (Signed)

## 2013-11-14 NOTE — Progress Notes (Signed)
Subjective:    Patient ID: Diane Haney, female    DOB: 1946-08-10, 67 y.o.   MRN: [id number]  HPI  Here for wellness and f/u;  Overall doing ok;  Pt denies CP, worsening SOB, DOE, wheezing, orthopnea, PND, worsening LE edema, palpitations, dizziness or syncope.  Pt denies neurological change such as new headache, facial or extremity weakness.  Pt denies polydipsia, polyuria, or low sugar symptoms. Pt states overall good compliance with treatment and medications, good tolerability, and has been trying to follow lower cholesterol diet.  Pt denies worsening depressive symptoms, suicidal ideation or panic. No fever, night sweats, wt loss, loss of appetite, or other constitutional symptoms.  Pt states good ability with ADL's, has low fall risk, home safety reviewed and adequate, no other significant changes in hearing or vision, and almost daily active with exercise. Checks her BP regularly, with 2 episode SBP 90 asymptomatic.  Has recent dx of osteoporosis per oncology - has already had Prolia x 1 Past Medical History  Diagnosis Date  . COPD (chronic obstructive pulmonary disease)   . Hyperlipidemia   . Hypertension   . Pulmonary Mycobacterium avium complex (MAC) infection   . Bronchiectasis   . History of shingles   . GERD (gastroesophageal reflux disease)   . Fibromyalgia   . Migraine   . Osteopenia   . Squamous cell skin cancer, multiple sites   . DVT, lower extremity     LLE  . Breast cancer 11/12/2012    Dx oct 2013 - [facility] - Invasive ductal carcinoma, s/o bilat mastectomy with + margin  - also for XRT soon  . Chronic fatigue fibromyalgia syndrome 11/12/2012  . Impaired glucose tolerance 11/14/2013  . Osteoporosis 10/16/2007    Qualifier: Diagnosis of  By: Jonny Ruiz MD, Len Blalock    Past Surgical History  Procedure Laterality Date  . Shoulder impingement    . Foot surgery    . Mastectomy bilateral oct 2013      reports that she quit smoking about 27 years ago. Her  smoking use included Cigarettes. She has a 25 pack-year smoking history. She has never used smokeless tobacco. She reports that she does not drink alcohol or use illicit drugs. family history includes Breast cancer in her other; Colon polyps in her father; Heart disease in her other; Hyperlipidemia in her other; Hypertension in her other; Lung cancer in her father; Other in her other. Allergies  Allergen Reactions  . Losartan     headache  . Adhesive [Tape]     Blisters  . Ciprofloxacin     REACTION: tendinitis  . Clarithromycin     REACTION: severe reflux  . Prednisone     REACTION: irregular heartbeat, not able to sleep   Current Outpatient Prescriptions on File Prior to Visit  Medication Sig Dispense Refill  . aspirin 81 MG EC tablet Take 81 mg by mouth daily.        . calcium citrate-vitamin D (CITRACAL+D) 315-200 MG-UNIT per tablet Take 1 tablet by mouth daily.       Marland Kitchen DEXILANT 60 MG capsule Take 1 capsule by mouth daily.      Marland Kitchen ethambutol (MYAMBUTOL) 400 MG tablet Take 1 tablet (400 mg total) by mouth 3 (three) times a week. Take three tabs three times weekly  60 tablet  3  . letrozole (FEMARA) 2.5 MG tablet Take 2.5 mg by mouth daily.      . Methylcellulose, Laxative, (CITRUCEL PO) Take 1  tablet by mouth daily.      . rifampin (RIFADIN) 300 MG capsule Take 2 capsules (600 mg total) by mouth 3 (three) times a week. Take 2 tablets by mouth three times weekly  60 capsule  3   No current facility-administered medications on file prior to visit.   Review of Systems Constitutional: Negative for diaphoresis, activity change, appetite change or unexpected weight change.  HENT: Negative for hearing loss, ear pain, facial swelling, mouth sores and neck stiffness.   Eyes: Negative for pain, redness and visual disturbance.  Respiratory: Negative for shortness of breath and wheezing.   Cardiovascular: Negative for chest pain and palpitations.  Gastrointestinal: Negative for diarrhea, blood  in stool, abdominal distention or other pain Genitourinary: Negative for hematuria, flank pain or change in urine volume.  Musculoskeletal: Negative for myalgias and joint swelling.  Skin: Negative for color change and wound.  Neurological: Negative for syncope and numbness. other than noted Hematological: Negative for adenopathy.  Psychiatric/Behavioral: Negative for hallucinations, self-injury, decreased concentration and agitation.      Objective:   Physical Exam BP 132/80  Pulse 112  Temp(Src) 97.1 F (36.2 C) (Oral)  Ht 5' 5.5" (1.664 m)  Wt 114 lb 4 oz (51.823 kg)  BMI 18.72 kg/m2  SpO2 98% VS noted,  Constitutional: Pt is oriented to person, place, and time. Appears well-developed and well-nourished.  Head: Normocephalic and atraumatic.  Right Ear: External ear normal.  Left Ear: External ear normal.  Nose: Nose normal.  Mouth/Throat: Oropharynx is clear and moist.  Eyes: Conjunctivae and EOM are normal. Pupils are equal, round, and reactive to light.  Neck: Normal range of motion. Neck supple. No JVD present. No tracheal deviation present.  Cardiovascular: Normal rate, regular rhythm, normal heart sounds and intact distal pulses.   Pulmonary/Chest: Effort normal and breath sounds somewhat decreased.  Abdominal: Soft. Bowel sounds are normal. There is no tenderness. No HSM  Musculoskeletal: Normal range of motion. Exhibits no edema.  Lymphadenopathy:  Has no cervical adenopathy.  Neurological: Pt is alert and oriented to person, place, and time. Pt has normal reflexes. No cranial nerve deficit.  Skin: Skin is warm and dry. No rash noted.  Psychiatric:  Has  normal mood and affect. Behavior is normal.      Assessment & Plan:

## 2013-11-14 NOTE — Progress Notes (Signed)
Pre-visit discussion using our clinic review tool. No additional management support is needed unless otherwise documented below in the visit note.  

## 2013-11-14 NOTE — Patient Instructions (Addendum)
You had the Prevnar pneumonia shot today Please continue all other medications as before, and refills have been done if requested. Please have the pharmacy call with any other refills you may need. Please continue your efforts at being more active, low cholesterol diet, and weight control. You are otherwise up to date with prevention measures today.  Please go to the LAB in the Basement (turn left off the elevator) for the tests to be done today You will be contacted by phone if any changes need to be made immediately.  Otherwise, you will receive a letter about your results with an explanation, but please check with MyChart first.  Please remember to sign up for My Chart if you have not done so, as this will be important to you in the future with finding out test results, communicating by private email, and scheduling acute appointments online when needed.  Please keep your appointments with your specialists as you have planned  Please return in 1 year for your yearly visit, or sooner if needed

## 2013-11-25 ENCOUNTER — Ambulatory Visit: Payer: Medicare Other | Admitting: Critical Care Medicine

## 2013-12-02 ENCOUNTER — Encounter: Payer: Self-pay | Admitting: Critical Care Medicine

## 2013-12-02 ENCOUNTER — Ambulatory Visit (INDEPENDENT_AMBULATORY_CARE_PROVIDER_SITE_OTHER): Payer: Medicare Other | Admitting: Critical Care Medicine

## 2013-12-02 VITALS — BP 116/78 | HR 72 | Temp 98.1°F | Ht 65.5 in | Wt 110.5 lb

## 2013-12-02 DIAGNOSIS — A310 Pulmonary mycobacterial infection: Secondary | ICD-10-CM | POA: Diagnosis not present

## 2013-12-02 NOTE — Patient Instructions (Signed)
No change in medications. Return in         4 months 

## 2013-12-02 NOTE — Progress Notes (Signed)
Subjective:    Patient ID: Diane Haney, female    DOB: 03-19-1946, 67 y.o.   MRN: [id number]  HPI  67 y.o.   with hx cough. Was thought to have scar tissue on CXR but then later felt to have bronchiectasis (abnormal CT scans as well). Had bronchoscopy in feb 2005 showing AFB but cx did not grow. then had endoscopy in May 2005 which grew MAI (S- clarithro, eth, RIF 8.0, synergy positive for E/R) and fortuitum (S- cipro/smikacin/tigecycline).  began antibiotics may 2005 and was treated with ETH/Azithro and cipro. she continued on anbiotics until March 2007. Did well until July 2007 when she had a CT scan which showed some "activation" of MAI.  She was restarted on her MAI therapy in Nov 2008. Last CT scan was 7-09 " 1. Stable mild lingular bronchiectasis. Stable tiny associated left lung nodules, which are consistent with a postinflammatory  etiology.   12/02/2013 Chief Complaint  Patient presents with  . Follow-up    3 month rov.  Pt c/o SOB with exertion, no other complaints at this time.   Pt with DOE only.  Notes cough but is nonproductive.  No fever, occ discomfort.  No night sweats. Occ lower chest wall pain Pt now on triple drug Rx.  C/o head aches and light headed  Past Medical History  Diagnosis Date  . COPD (chronic obstructive pulmonary disease)   . Hyperlipidemia   . Hypertension   . Pulmonary Mycobacterium avium complex (MAC) infection   . Bronchiectasis   . History of shingles   . GERD (gastroesophageal reflux disease)   . Fibromyalgia   . Migraine   . Osteopenia   . Squamous cell skin cancer, multiple sites   . DVT, lower extremity     LLE  . Breast cancer 11/12/2012    Dx oct 2013 - [facility] - Invasive ductal carcinoma, s/o bilat mastectomy with + margin  - also for XRT soon  . Chronic fatigue fibromyalgia syndrome 11/12/2012  . Impaired glucose tolerance 11/14/2013  . Osteoporosis 10/16/2007    Qualifier: Diagnosis of  By: Jonny Ruiz MD, Len Blalock       Family History  Problem Relation Age of Onset  . Lung cancer Father   . Heart disease Other     grandparents  . Breast cancer Other   . Hyperlipidemia Other   . Hypertension Other   . Other Other     alcholism / addiction  . Colon polyps Father      History   Social History  . Marital Status: Married    Spouse Name: N/A    Number of Children: 3  . Years of Education: N/A   Occupational History  . previously part-time MD's office    Social History Main Topics  . Smoking status: Former Smoker -- 1.00 packs/day for 25 years    Types: Cigarettes    Quit date: 12/11/1985  . Smokeless tobacco: Never Used     Comment: pt does not smoke  . Alcohol Use: No     Comment: occasional  . Drug Use: No  . Sexual Activity: Not Currently   Other Topics Concern  . Not on file   Social History Narrative   Has not worked since apprx 1990     Allergies  Allergen Reactions  . Losartan     headache  . Adhesive [Tape]     Blisters  . Ciprofloxacin     REACTION: tendinitis  . Clarithromycin  REACTION: severe reflux  . Prednisone     REACTION: irregular heartbeat, not able to sleep     Outpatient Prescriptions Prior to Visit  Medication Sig Dispense Refill  . amLODipine (NORVASC) 5 MG tablet Take 1 tablet (5 mg total) by mouth daily.  90 tablet  3  . aspirin 81 MG EC tablet Take 81 mg by mouth daily.        . calcium citrate-vitamin D (CITRACAL+D) 315-200 MG-UNIT per tablet Take 2 tablets by mouth daily.       . carisoprodol (SOMA) 350 MG tablet Take 1 tablet (350 mg total) by mouth daily.  30 tablet  5  . DEXILANT 60 MG capsule Take 1 capsule by mouth daily.      . irbesartan (AVAPRO) 300 MG tablet Take 1 tablet (300 mg total) by mouth daily.  90 tablet  3  . letrozole (FEMARA) 2.5 MG tablet Take 2.5 mg by mouth daily.      . Methylcellulose, Laxative, (CITRUCEL PO) Take 2 tablets by mouth daily.       . simvastatin (ZOCOR) 40 MG tablet 1/2 tablet daily  90 tablet  3   . temazepam (RESTORIL) 15 MG capsule 1-2 tab by mouth at night for sleep  60 capsule  5  . ethambutol (MYAMBUTOL) 400 MG tablet Take 1 tablet (400 mg total) by mouth 3 (three) times a week. Take three tabs three times weekly  60 tablet  3  . rifampin (RIFADIN) 300 MG capsule Take 2 capsules (600 mg total) by mouth 3 (three) times a week. Take 2 tablets by mouth three times weekly  60 capsule  3   No facility-administered medications prior to visit.     Review of Systems  Constitutional:   No  weight loss, night sweats,  Fevers, occ  Chills,has  fatigue, lassitude. HEENT:   No headaches,  Difficulty swallowing,  Tooth/dental problems,  Sore throat,                No sneezing, itching, ear ache, nasal congestion, occ  post nasal drip,   CV:  No chest pain,  Orthopnea, PND, swelling in lower extremities, anasarca, dizziness, palpitations  GI  No heartburn, indigestion, abdominal pain, nausea, vomiting, diarrhea, change in bowel habits, loss of appetite  Resp: Notes  shortness of breath with exertion not at rest.  Notes  excess mucus, notes  productive cough,  Notes a  non-productive cough,  No coughing up of blood.  Notes  change in color of mucus.  No wheezing.  No chest wall deformity  Skin: no rash or lesions.  GU: no dysuria, change in color of urine, no urgency or frequency.  No flank pain.  MS:  No joint pain or swelling.  No decreased range of motion.  No back pain.  Psych:  No change in mood or affect. No depression or anxiety.  No memory loss.     Objective:   Physical Exam BP 116/78  Pulse 72  Temp(Src) 98.1 F (36.7 C) (Oral)  Ht 5' 5.5" (1.664 m)  Wt 50.122 kg (110 lb 8 oz)  BMI 18.10 kg/m2  SpO2 98%  Gen: Pleasant, well-nourished, in no distress,  normal affect  ENT: No lesions,  mouth clear,  oropharynx clear, no postnasal drip  Neck: No JVD, no TMG, no carotid bruits  Lungs: No use of accessory muscles, no dullness to percussion, distant BS, exp wheeze RUL  and LUL areas but improved  Cardiovascular: RRR, heart  sounds normal, no murmur or gallops, no peripheral edema  Abdomen: soft and NT, no HSM,  BS normal  Musculoskeletal: No deformities, no cyanosis or clubbing  Neuro: alert, non focal  Skin: Warm, no lesions or rashes     No results found.  Assessment & Plan:   Pulmonary diseases due to other mycobacteria MAC with bronchiectasis slowly better on triple therapy Plan Cont rifampin/azithro/myambutol     Updated Medication List Outpatient Encounter Prescriptions as of 12/02/2013  Medication Sig  . amLODipine (NORVASC) 5 MG tablet Take 1 tablet (5 mg total) by mouth daily.  Marland Kitchen aspirin 81 MG EC tablet Take 81 mg by mouth daily.    Marland Kitchen azithromycin (ZITHROMAX) 500 MG tablet Take 500 mg by mouth 3 (three) times a week.  . calcium citrate-vitamin D (CITRACAL+D) 315-200 MG-UNIT per tablet Take 2 tablets by mouth daily.   . carisoprodol (SOMA) 350 MG tablet Take 1 tablet (350 mg total) by mouth daily.  Marland Kitchen DEXILANT 60 MG capsule Take 1 capsule by mouth daily.  Marland Kitchen ethambutol (MYAMBUTOL) 400 MG tablet Take 400 mg by mouth 3 (three) times a week.  . irbesartan (AVAPRO) 300 MG tablet Take 1 tablet (300 mg total) by mouth daily.  Marland Kitchen letrozole (FEMARA) 2.5 MG tablet Take 2.5 mg by mouth daily.  . Methylcellulose, Laxative, (CITRUCEL PO) Take 2 tablets by mouth daily.   . rifampin (RIFADIN) 300 MG capsule Take 600 mg by mouth 3 (three) times a week.  . simvastatin (ZOCOR) 40 MG tablet 1/2 tablet daily  . temazepam (RESTORIL) 15 MG capsule 1-2 tab by mouth at night for sleep  . [DISCONTINUED] ethambutol (MYAMBUTOL) 400 MG tablet Take 1 tablet (400 mg total) by mouth 3 (three) times a week. Take three tabs three times weekly  . [DISCONTINUED] rifampin (RIFADIN) 300 MG capsule Take 2 capsules (600 mg total) by mouth 3 (three) times a week. Take 2 tablets by mouth three times weekly

## 2013-12-03 NOTE — Assessment & Plan Note (Signed)
MAC with bronchiectasis slowly better on triple therapy Plan Cont rifampin/azithro/myambutol

## 2014-01-07 DIAGNOSIS — C50919 Malignant neoplasm of unspecified site of unspecified female breast: Secondary | ICD-10-CM | POA: Diagnosis not present

## 2014-01-07 DIAGNOSIS — Z853 Personal history of malignant neoplasm of breast: Secondary | ICD-10-CM | POA: Diagnosis not present

## 2014-01-07 DIAGNOSIS — Z5181 Encounter for therapeutic drug level monitoring: Secondary | ICD-10-CM | POA: Diagnosis not present

## 2014-01-07 DIAGNOSIS — Z170 Estrogen receptor positive status [ER+]: Secondary | ICD-10-CM | POA: Diagnosis not present

## 2014-01-07 DIAGNOSIS — Z79811 Long term (current) use of aromatase inhibitors: Secondary | ICD-10-CM | POA: Diagnosis not present

## 2014-01-07 DIAGNOSIS — M810 Age-related osteoporosis without current pathological fracture: Secondary | ICD-10-CM | POA: Diagnosis not present

## 2014-01-07 DIAGNOSIS — Z09 Encounter for follow-up examination after completed treatment for conditions other than malignant neoplasm: Secondary | ICD-10-CM | POA: Diagnosis not present

## 2014-01-08 DIAGNOSIS — Z124 Encounter for screening for malignant neoplasm of cervix: Secondary | ICD-10-CM | POA: Diagnosis not present

## 2014-01-12 ENCOUNTER — Ambulatory Visit (INDEPENDENT_AMBULATORY_CARE_PROVIDER_SITE_OTHER): Payer: Medicare Other | Admitting: Infectious Diseases

## 2014-01-12 ENCOUNTER — Encounter: Payer: Self-pay | Admitting: Infectious Diseases

## 2014-01-12 ENCOUNTER — Ambulatory Visit (HOSPITAL_BASED_OUTPATIENT_CLINIC_OR_DEPARTMENT_OTHER)
Admission: RE | Admit: 2014-01-12 | Discharge: 2014-01-12 | Disposition: A | Discharge status: Home / Self Care | Payer: Medicare Other | Source: Ambulatory Visit | Attending: Infectious Diseases | Admitting: Infectious Diseases

## 2014-01-12 VITALS — BP 146/83 | HR 85 | Temp 98.4°F | Ht 65.0 in | Wt 108.0 lb

## 2014-01-12 DIAGNOSIS — J479 Bronchiectasis, uncomplicated: Secondary | ICD-10-CM | POA: Diagnosis not present

## 2014-01-12 DIAGNOSIS — A310 Pulmonary mycobacterial infection: Secondary | ICD-10-CM

## 2014-01-12 DIAGNOSIS — C50919 Malignant neoplasm of unspecified site of unspecified female breast: Secondary | ICD-10-CM | POA: Insufficient documentation

## 2014-01-12 DIAGNOSIS — I2584 Coronary atherosclerosis due to calcified coronary lesion: Secondary | ICD-10-CM | POA: Diagnosis not present

## 2014-01-12 MED ORDER — RIFAMPIN 300 MG PO CAPS: 600.0000 mg | ORAL_CAPSULE | ORAL | Status: DC

## 2014-01-12 MED ORDER — ETHAMBUTOL HCL 400 MG PO TABS: 1200.0000 mg | ORAL_TABLET | ORAL | Status: DC

## 2014-01-12 NOTE — Progress Notes (Signed)
   Subjective:    Patient ID: Diane Haney, female    DOB: Apr 01, 1946, 68 y.o.   MRN: [id number]  HPI 68 yo F with hx endoscopy in May 2005 which grew MAI (S- clarithro, eth, RIF 8.0, synergy positive for E/R) and fortuitum (S- cipro/smikacin/tigecycline).She began antibiotics May 2005 and was treated with ETH/Azithro and cipro until March 2007. Did well until July 2007 when she had a CT scan which showed some "activation" of MAI.  She was restarted on her MAI therapy in Nov 2008. CT scan was 7-09 " 1. Stable mild lingular bronchiectasis. Stable tiny associated left lung nodules, which are consistent with a postinflammatory  etiology. 2. No active disease." Seen in ID November of 2009, was doing well and was taken off meds for MAI.  Seen November 2011 and felt like her "MAC is back". More coughing, no fevers, no chills, SOB has been at baseline. She had f/u CT 10-20-10: 1. New tree-in-bud opacities in the right middle lobe since the  prior CT from July, 2009, consistent with MAI. 2. Stable scarring, bronchiectasis, and tree-in-bud opacities in the inferior left upper lobe adjacent to the major fissure. Stable scar and bronchiectasis medially in the right middle lobe. No new pulmonary parenchymal abnormalities elsewhere.  3. Stable hyperinflation consistent with COPD and/or asthma. 4. No significant lymphadenopathy.  She was restarted on MAI rx- Azithro/ETH/Rif.  She has been dx with breast cancer (BRCA-, resected october 2013) and has received XRT/tamoxifen for 5 years. Now on letrizole.    As part of her f/u she underwent CT (06-19-13): Bilateral areas of bronchiectasis and peripheral tree in bud nodularity are identified. Findings are consistent with indolent  atypical infection such as MAI. When compared with the previous  exam there is been mild disease progression throughout the right lung. She was restarted on her E/A/R in August of 2014.  Had labs done at her oncologist last week- AST/ALT  normal. She has been tolerating her medications. She has daily headaches since on medications. Feels light headed on anbx. She is not sure she "feels any different" so not sure they are working. Still has loose cough, non-prod. No more deep.  Has ophtho eval next month- feels like her cataracts are worse.   Review of Systems  Constitutional: Positive for unexpected weight change. Negative for fever, chills and appetite change.  Eyes: Negative for visual disturbance.  Respiratory: Positive for choking. Negative for shortness of breath.   Gastrointestinal: Negative for diarrhea and constipation.  Genitourinary: Negative for difficulty urinating.      Objective:   Physical Exam  Constitutional: She appears well-developed and well-nourished.  Eyes: EOM are normal. Pupils are equal, round, and reactive to light.  Neck: Neck supple.  Cardiovascular: Normal rate, regular rhythm and normal heart sounds.   Pulmonary/Chest: Effort normal and breath sounds normal. No respiratory distress. She has no wheezes. She has no rales.  Abdominal: Soft. Bowel sounds are normal. There is no tenderness.  Lymphadenopathy:    She has no cervical adenopathy.          Assessment & Plan:

## 2014-01-12 NOTE — Assessment & Plan Note (Signed)
Will recheck her CT scan.

## 2014-01-12 NOTE — Assessment & Plan Note (Signed)
She has been on anti-MAI rx now for 6 months and has tolerated it reasonably well. She would like to have her CT chest repeated to see if she has made any progress- this seems reasonable to me. Will order this and plan to see her back in 6 months provided there is no worsening and she continues to tolerate her meds.

## 2014-01-13 ENCOUNTER — Telehealth: Payer: Self-pay | Admitting: Infectious Diseases

## 2014-01-13 NOTE — Telephone Encounter (Signed)
Called pt and left message that her CT is unchanged.

## 2014-01-14 ENCOUNTER — Encounter: Payer: Self-pay | Admitting: Infectious Diseases

## 2014-01-23 DIAGNOSIS — E7800 Pure hypercholesterolemia, unspecified: Secondary | ICD-10-CM | POA: Diagnosis not present

## 2014-01-23 DIAGNOSIS — Z803 Family history of malignant neoplasm of breast: Secondary | ICD-10-CM | POA: Diagnosis not present

## 2014-01-23 DIAGNOSIS — Z87891 Personal history of nicotine dependence: Secondary | ICD-10-CM | POA: Diagnosis not present

## 2014-01-23 DIAGNOSIS — Z09 Encounter for follow-up examination after completed treatment for conditions other than malignant neoplasm: Secondary | ICD-10-CM | POA: Diagnosis not present

## 2014-01-23 DIAGNOSIS — C50519 Malignant neoplasm of lower-outer quadrant of unspecified female breast: Secondary | ICD-10-CM | POA: Diagnosis not present

## 2014-01-23 DIAGNOSIS — I10 Essential (primary) hypertension: Secondary | ICD-10-CM | POA: Diagnosis not present

## 2014-01-23 DIAGNOSIS — J449 Chronic obstructive pulmonary disease, unspecified: Secondary | ICD-10-CM | POA: Diagnosis not present

## 2014-01-26 DIAGNOSIS — H524 Presbyopia: Secondary | ICD-10-CM | POA: Diagnosis not present

## 2014-01-26 DIAGNOSIS — H2510 Age-related nuclear cataract, unspecified eye: Secondary | ICD-10-CM | POA: Diagnosis not present

## 2014-01-26 DIAGNOSIS — H43819 Vitreous degeneration, unspecified eye: Secondary | ICD-10-CM | POA: Diagnosis not present

## 2014-01-26 DIAGNOSIS — H02839 Dermatochalasis of unspecified eye, unspecified eyelid: Secondary | ICD-10-CM | POA: Diagnosis not present

## 2014-03-31 ENCOUNTER — Ambulatory Visit: Payer: Medicare Other | Admitting: Critical Care Medicine

## 2014-04-10 ENCOUNTER — Encounter: Payer: Self-pay | Admitting: Infectious Diseases

## 2014-04-10 ENCOUNTER — Other Ambulatory Visit: Payer: Self-pay | Admitting: Licensed Clinical Social Worker

## 2014-04-10 DIAGNOSIS — A310 Pulmonary mycobacterial infection: Secondary | ICD-10-CM

## 2014-04-10 MED ORDER — AZITHROMYCIN 500 MG PO TABS: 500.0000 mg | ORAL_TABLET | ORAL | Status: DC

## 2014-04-13 DIAGNOSIS — M810 Age-related osteoporosis without current pathological fracture: Secondary | ICD-10-CM | POA: Diagnosis not present

## 2014-04-14 ENCOUNTER — Other Ambulatory Visit: Payer: Self-pay | Admitting: Licensed Clinical Social Worker

## 2014-04-14 DIAGNOSIS — A310 Pulmonary mycobacterial infection: Secondary | ICD-10-CM

## 2014-04-14 MED ORDER — AZITHROMYCIN 500 MG PO TABS: 500.0000 mg | ORAL_TABLET | ORAL | Status: DC

## 2014-04-16 ENCOUNTER — Ambulatory Visit (INDEPENDENT_AMBULATORY_CARE_PROVIDER_SITE_OTHER): Payer: Medicare Other | Admitting: Critical Care Medicine

## 2014-04-16 ENCOUNTER — Encounter: Payer: Self-pay | Admitting: Critical Care Medicine

## 2014-04-16 VITALS — BP 128/74 | HR 89 | Temp 98.9°F | Ht 65.5 in | Wt 112.0 lb

## 2014-04-16 DIAGNOSIS — A310 Pulmonary mycobacterial infection: Secondary | ICD-10-CM

## 2014-04-16 LAB — CBC WITH DIFFERENTIAL/PLATELET
BASOS ABS: 0 10*3/uL (ref 0.0–0.1)
Basophils Relative: 0 % (ref 0–1)
Eosinophils Absolute: 0.1 10*3/uL (ref 0.0–0.7)
Eosinophils Relative: 2 % (ref 0–5)
HCT: 41.2 % (ref 36.0–46.0)
Hemoglobin: 14.3 g/dL (ref 12.0–15.0)
Lymphocytes Relative: 12 % (ref 12–46)
Lymphs Abs: 0.7 10*3/uL (ref 0.7–4.0)
MCH: 32.8 pg (ref 26.0–34.0)
MCHC: 34.7 g/dL (ref 30.0–36.0)
MCV: 94.5 fL (ref 78.0–100.0)
Monocytes Absolute: 0.6 10*3/uL (ref 0.1–1.0)
Monocytes Relative: 11 % (ref 3–12)
Neutro Abs: 4.3 10*3/uL (ref 1.7–7.7)
Neutrophils Relative %: 75 % (ref 43–77)
PLATELETS: 212 10*3/uL (ref 150–400)
RBC: 4.36 MIL/uL (ref 3.87–5.11)
RDW: 13.7 % (ref 11.5–15.5)
WBC: 5.7 10*3/uL (ref 4.0–10.5)

## 2014-04-16 LAB — HEPATIC FUNCTION PANEL
ALT: 13 U/L (ref 0–35)
AST: 19 U/L (ref 0–37)
Albumin: 4.4 g/dL (ref 3.5–5.2)
Alkaline Phosphatase: 33 U/L — ABNORMAL LOW (ref 39–117)
BILIRUBIN TOTAL: 0.6 mg/dL (ref 0.2–1.2)
Bilirubin, Direct: 0.1 mg/dL (ref 0.0–0.3)
Indirect Bilirubin: 0.5 mg/dL (ref 0.2–1.2)
Total Protein: 7.2 g/dL (ref 6.0–8.3)

## 2014-04-16 NOTE — Patient Instructions (Signed)
A bronchoscopy is scheduled for May 28 at [facility], arrive 7AM, nothing by mouth after midnight night before.   No medication changes Return in 4 months

## 2014-04-16 NOTE — Progress Notes (Addendum)
Subjective:    Patient ID: Diane Haney, female    DOB: [birth year], 68 y.o.   MRN: [id number]  HPI  68 y.o.   with hx cough. Was thought to have scar tissue on CXR but then later felt to have bronchiectasis (abnormal CT scans as well). Had bronchoscopy in feb 2005 showing AFB but cx did not grow. then had endoscopy in May 2005 which grew MAI (S- clarithro, eth, RIF 8.0, synergy positive for E/R) and fortuitum (S- cipro/smikacin/tigecycline).  began antibiotics may 2005 and was treated with ETH/Azithro and cipro. she continued on anbiotics until March 2007. Did well until July 2007 when she had a CT scan which showed some "activation" of MAI.  She was restarted on her MAI therapy in Nov 2008. Last CT scan was 7-09 " 1. Stable mild lingular bronchiectasis. Stable tiny associated left lung nodules, which are consistent with a postinflammatory  etiology.   04/16/2014 Chief Complaint  Patient presents with  . 4 month follow up    Believes SOB has worsened over the past 3-4 months.  Has SOB when walking short distances.  Increased cough - nonprod.  No chest tightness/pain.  Pt is coughing more, barking cough.  Notes no wheezing. Occ QHS gurgle.  No edema in LE.  No real Pndrip.  Cough is dry.    Review of Systems  Constitutional:   No  weight loss, ++night sweats,  no Fever, occ  Chill,  ++has  Fatigue++lassitude. HEENT:   No headaches,  Difficulty swallowing,  Tooth/dental problems,  Sore throat,                No sneezing, itching, ear ache, nasal congestion, occ  post nasal drip,   CV:  No chest pain,  Orthopnea, PND, swelling in lower extremities, anasarca, dizziness, palpitations  GI  No heartburn, indigestion, abdominal pain, nausea, vomiting, diarrhea, change in bowel habits, loss of appetite  Resp: Notes  shortness of breath with exertion not at rest.  No  excess mucus, no  productive cough,  Notes a  non-productive cough,  No coughing up of blood.  No change in color of mucus.  No  wheezing.  No chest wall deformity  Skin: no rash or lesions.  GU: no dysuria, change in color of urine, no urgency or frequency.  No flank pain.  MS:  No joint pain or swelling.  No decreased range of motion.  No back pain.  Psych:  No change in mood or affect. No depression or anxiety.  No memory loss.     Objective:   Physical Exam BP 128/74  Pulse 89  Temp(Src) 98.9 F (37.2 C) (Oral)  Ht 5' 5.5" (1.664 m)  Wt 112 lb (50.803 kg)  BMI 18.35 kg/m2  SpO2 100%  Gen: Pleasant, well-nourished, in no distress,  normal affect  ENT: No lesions,  mouth clear,  oropharynx clear, no postnasal drip  Neck: No JVD, no TMG, no carotid bruits  Lungs: No use of accessory muscles, no dullness to percussion, distant BS, exp wheeze RUL and LUL areas but improved  Cardiovascular: RRR, heart sounds normal, no murmur or gallops, no peripheral edema  Abdomen: soft and NT, no HSM,  BS normal  Musculoskeletal: No deformities, no cyanosis or clubbing  Neuro: alert, non focal  Skin: Warm, no lesions or rashes     No results found.  Assessment & Plan:   Pulmonary diseases due to other mycobacteria Bronchiectasis with MAI now stable.   Resume Azithromycin/Rif/Myambutol  07/2013 x 1 year per ID  Fob 2012 Neg for AFB on culture Recent CT chest 01/2014 stable Plan  Cont ABX Repeat surveillance FOB 05/07/14     Updated Medication List Outpatient Encounter Prescriptions as of 04/16/2014  Medication Sig  . amLODipine (NORVASC) 5 MG tablet Take 1 tablet (5 mg total) by mouth daily.  Marland Kitchen aspirin 81 MG EC tablet Take 81 mg by mouth daily.    Marland Kitchen azithromycin (ZITHROMAX) 500 MG tablet Take 1 tablet (500 mg total) by mouth 3 (three) times a week.  . calcium citrate-vitamin D (CITRACAL+D) 315-200 MG-UNIT per tablet Take 2 tablets by mouth daily.   . carisoprodol (SOMA) 350 MG tablet Take 1 tablet (350 mg total) by mouth daily.  Marland Kitchen denosumab (PROLIA) 60 MG/ML SOLN injection Inject 60 mg into the skin  every 6 (six) months. Administer in upper arm, thigh, or abdomen  . DEXILANT 60 MG capsule Take 1 capsule by mouth daily.  Marland Kitchen ethambutol (MYAMBUTOL) 400 MG tablet Take 3 tablets (1,200 mg total) by mouth 3 (three) times a week. Monday, Wednesday, Friday  . irbesartan (AVAPRO) 300 MG tablet Take 1 tablet (300 mg total) by mouth daily.  Marland Kitchen letrozole (FEMARA) 2.5 MG tablet Take 2.5 mg by mouth daily.  . Methylcellulose, Laxative, (CITRUCEL PO) Take 2 tablets by mouth daily.   . rifampin (RIFADIN) 300 MG capsule Take 2 capsules (600 mg total) by mouth 3 (three) times a week. Monday, Wednesday, Friday  . simvastatin (ZOCOR) 40 MG tablet 1/2 tablet daily  . temazepam (RESTORIL) 15 MG capsule 1-2 tab by mouth at night for sleep  . [DISCONTINUED] azithromycin (ZITHROMAX) 500 MG tablet Take 500 mg by mouth 3 (three) times a week. Monday, Wednesday, Friday

## 2014-04-16 NOTE — Assessment & Plan Note (Signed)
Bronchiectasis with MAI now stable.   Resume Azithromycin/Rif/Myambutol 07/2013 x 1 year per ID  Fob 2012 Neg for AFB on culture Recent CT chest 01/2014 stable Plan  Cont ABX Repeat surveillance FOB 05/07/14

## 2014-04-17 ENCOUNTER — Telehealth: Payer: Self-pay | Admitting: Critical Care Medicine

## 2014-04-17 ENCOUNTER — Encounter: Payer: Self-pay | Admitting: Critical Care Medicine

## 2014-04-17 NOTE — Progress Notes (Signed)
Quick Note:  lmomtcb for pt Will also send pt results through [place]. ______

## 2014-04-17 NOTE — Progress Notes (Signed)
Quick Note:  Call pt and tell her labs are ok, No change in medications ______ 

## 2014-04-20 NOTE — Telephone Encounter (Signed)
Spoke with pt.  She has already received results through [place].  Reports she called prior to seeing theses results.  Pt reports nothing further is needed at this time.

## 2014-05-05 DIAGNOSIS — L820 Inflamed seborrheic keratosis: Secondary | ICD-10-CM | POA: Diagnosis not present

## 2014-05-05 DIAGNOSIS — Z85828 Personal history of other malignant neoplasm of skin: Secondary | ICD-10-CM | POA: Diagnosis not present

## 2014-05-05 DIAGNOSIS — L578 Other skin changes due to chronic exposure to nonionizing radiation: Secondary | ICD-10-CM | POA: Diagnosis not present

## 2014-05-05 DIAGNOSIS — L821 Other seborrheic keratosis: Secondary | ICD-10-CM | POA: Diagnosis not present

## 2014-05-05 DIAGNOSIS — L570 Actinic keratosis: Secondary | ICD-10-CM | POA: Diagnosis not present

## 2014-05-06 ENCOUNTER — Encounter (HOSPITAL_COMMUNITY): Payer: Self-pay

## 2014-05-07 ENCOUNTER — Encounter (HOSPITAL_COMMUNITY): Payer: Self-pay | Admitting: Critical Care Medicine

## 2014-05-07 ENCOUNTER — Ambulatory Visit (HOSPITAL_COMMUNITY)
Admission: RE | Admit: 2014-05-07 | Discharge: 2014-05-07 | Disposition: A | Discharge status: Home / Self Care | Payer: Medicare Other | Source: Ambulatory Visit | Attending: Critical Care Medicine | Admitting: Critical Care Medicine

## 2014-05-07 ENCOUNTER — Encounter (HOSPITAL_COMMUNITY)
Admission: RE | Disposition: A | Discharge status: Home / Self Care | Payer: Self-pay | Source: Ambulatory Visit | Attending: Critical Care Medicine

## 2014-05-07 DIAGNOSIS — J479 Bronchiectasis, uncomplicated: Secondary | ICD-10-CM | POA: Diagnosis not present

## 2014-05-07 DIAGNOSIS — Z7982 Long term (current) use of aspirin: Secondary | ICD-10-CM | POA: Insufficient documentation

## 2014-05-07 DIAGNOSIS — Z79899 Other long term (current) drug therapy: Secondary | ICD-10-CM | POA: Diagnosis not present

## 2014-05-07 DIAGNOSIS — A310 Pulmonary mycobacterial infection: Secondary | ICD-10-CM | POA: Insufficient documentation

## 2014-05-07 DIAGNOSIS — D1430 Benign neoplasm of unspecified bronchus and lung: Secondary | ICD-10-CM | POA: Diagnosis not present

## 2014-05-07 HISTORY — PX: VIDEO BRONCHOSCOPY: SHX5072

## 2014-05-07 SURGERY — VIDEO BRONCHOSCOPY WITHOUT FLUORO
Anesthesia: Moderate Sedation

## 2014-05-07 MED ORDER — SODIUM CHLORIDE 0.9 % IV SOLN
INTRAVENOUS | Status: DC
  Administered 2014-05-07: 08:00:00 via INTRAVENOUS

## 2014-05-07 MED ORDER — BUTAMBEN-TETRACAINE-BENZOCAINE 2-2-14 % EX AERO: 1.0000 | INHALATION_SPRAY | Freq: Once | CUTANEOUS | Status: DC

## 2014-05-07 MED ORDER — PHENYLEPHRINE HCL 0.25 % NA SOLN
NASAL | Status: DC | PRN
  Administered 2014-05-07: 1 via NASAL

## 2014-05-07 MED ORDER — MIDAZOLAM HCL 10 MG/2ML IJ SOLN
INTRAMUSCULAR | Status: AC
  Filled 2014-05-07: qty 4

## 2014-05-07 MED ORDER — LIDOCAINE HCL 2 % EX GEL: Freq: Once | CUTANEOUS | Status: DC

## 2014-05-07 MED ORDER — LIDOCAINE HCL 1 % IJ SOLN
INTRAMUSCULAR | Status: DC | PRN
  Administered 2014-05-07: 6 mL via INTRADERMAL

## 2014-05-07 MED ORDER — MIDAZOLAM HCL 10 MG/2ML IJ SOLN
INTRAMUSCULAR | Status: DC | PRN
  Administered 2014-05-07 (×2): 2 mg via INTRAVENOUS

## 2014-05-07 MED ORDER — FENTANYL CITRATE 0.05 MG/ML IJ SOLN
INTRAMUSCULAR | Status: DC | PRN
  Administered 2014-05-07 (×2): 50 ug via INTRAVENOUS

## 2014-05-07 MED ORDER — LIDOCAINE HCL 2 % EX GEL
CUTANEOUS | Status: DC | PRN
  Administered 2014-05-07: 1

## 2014-05-07 MED ORDER — FENTANYL CITRATE 0.05 MG/ML IJ SOLN
INTRAMUSCULAR | Status: AC
  Filled 2014-05-07: qty 4

## 2014-05-07 MED ORDER — PHENYLEPHRINE HCL 0.25 % NA SOLN: 1.0000 | Freq: Four times a day (QID) | NASAL | Status: DC | PRN

## 2014-05-07 NOTE — H&P (View-Only) (Signed)
Subjective:    Patient ID: Diane Haney, female    DOB: [birth year], 68 y.o.   MRN: [id number]  HPI  68 y.o.   with hx cough. Was thought to have scar tissue on CXR but then later felt to have bronchiectasis (abnormal CT scans as well). Had bronchoscopy in feb 2005 showing AFB but cx did not grow. then had endoscopy in May 2005 which grew MAI (S- clarithro, eth, RIF 8.0, synergy positive for E/R) and fortuitum (S- cipro/smikacin/tigecycline).  began antibiotics may 2005 and was treated with ETH/Azithro and cipro. she continued on anbiotics until March 2007. Did well until July 2007 when she had a CT scan which showed some "activation" of MAI.  She was restarted on her MAI therapy in Nov 2008. Last CT scan was 7-09 " 1. Stable mild lingular bronchiectasis. Stable tiny associated left lung nodules, which are consistent with a postinflammatory  etiology.   04/16/2014 Chief Complaint  Patient presents with  . 4 month follow up    Believes SOB has worsened over the past 3-4 months.  Has SOB when walking short distances.  Increased cough - nonprod.  No chest tightness/pain.  Pt is coughing more, barking cough.  Notes no wheezing. Occ QHS gurgle.  No edema in LE.  No real Pndrip.  Cough is dry.    Review of Systems  Constitutional:   No  weight loss, ++night sweats,  no Fever, occ  Chill,  ++has  Fatigue++lassitude. HEENT:   No headaches,  Difficulty swallowing,  Tooth/dental problems,  Sore throat,                No sneezing, itching, ear ache, nasal congestion, occ  post nasal drip,   CV:  No chest pain,  Orthopnea, PND, swelling in lower extremities, anasarca, dizziness, palpitations  GI  No heartburn, indigestion, abdominal pain, nausea, vomiting, diarrhea, change in bowel habits, loss of appetite  Resp: Notes  shortness of breath with exertion not at rest.  No  excess mucus, no  productive cough,  Notes a  non-productive cough,  No coughing up of blood.  No change in color of mucus.  No  wheezing.  No chest wall deformity  Skin: no rash or lesions.  GU: no dysuria, change in color of urine, no urgency or frequency.  No flank pain.  MS:  No joint pain or swelling.  No decreased range of motion.  No back pain.  Psych:  No change in mood or affect. No depression or anxiety.  No memory loss.     Objective:   Physical Exam BP 128/74  Pulse 89  Temp(Src) 98.9 F (37.2 C) (Oral)  Ht 5' 5.5" (1.664 m)  Wt 112 lb (50.803 kg)  BMI 18.35 kg/m2  SpO2 100%  Gen: Pleasant, well-nourished, in no distress,  normal affect  ENT: No lesions,  mouth clear,  oropharynx clear, no postnasal drip  Neck: No JVD, no TMG, no carotid bruits  Lungs: No use of accessory muscles, no dullness to percussion, distant BS, exp wheeze RUL and LUL areas but improved  Cardiovascular: RRR, heart sounds normal, no murmur or gallops, no peripheral edema  Abdomen: soft and NT, no HSM,  BS normal  Musculoskeletal: No deformities, no cyanosis or clubbing  Neuro: alert, non focal  Skin: Warm, no lesions or rashes     No results found.  Assessment & Plan:   Pulmonary diseases due to other mycobacteria Bronchiectasis with MAI now stable.   Resume Azithromycin/Rif/Myambutol  07/2013 x 1 year per ID  Fob 2012 Neg for AFB on culture Recent CT chest 01/2014 stable Plan  Cont ABX Repeat surveillance FOB 05/07/14     Updated Medication List Outpatient Encounter Prescriptions as of 04/16/2014  Medication Sig  . amLODipine (NORVASC) 5 MG tablet Take 1 tablet (5 mg total) by mouth daily.  Marland Kitchen aspirin 81 MG EC tablet Take 81 mg by mouth daily.    Marland Kitchen azithromycin (ZITHROMAX) 500 MG tablet Take 1 tablet (500 mg total) by mouth 3 (three) times a week.  . calcium citrate-vitamin D (CITRACAL+D) 315-200 MG-UNIT per tablet Take 2 tablets by mouth daily.   . carisoprodol (SOMA) 350 MG tablet Take 1 tablet (350 mg total) by mouth daily.  Marland Kitchen denosumab (PROLIA) 60 MG/ML SOLN injection Inject 60 mg into the skin  every 6 (six) months. Administer in upper arm, thigh, or abdomen  . DEXILANT 60 MG capsule Take 1 capsule by mouth daily.  Marland Kitchen ethambutol (MYAMBUTOL) 400 MG tablet Take 3 tablets (1,200 mg total) by mouth 3 (three) times a week. Monday, Wednesday, Friday  . irbesartan (AVAPRO) 300 MG tablet Take 1 tablet (300 mg total) by mouth daily.  Marland Kitchen letrozole (FEMARA) 2.5 MG tablet Take 2.5 mg by mouth daily.  . Methylcellulose, Laxative, (CITRUCEL PO) Take 2 tablets by mouth daily.   . rifampin (RIFADIN) 300 MG capsule Take 2 capsules (600 mg total) by mouth 3 (three) times a week. Monday, Wednesday, Friday  . simvastatin (ZOCOR) 40 MG tablet 1/2 tablet daily  . temazepam (RESTORIL) 15 MG capsule 1-2 tab by mouth at night for sleep  . [DISCONTINUED] azithromycin (ZITHROMAX) 500 MG tablet Take 500 mg by mouth 3 (three) times a week. Monday, Wednesday, Friday

## 2014-05-07 NOTE — Interval H&P Note (Signed)
Pt seen and examined and the pt has had no changes since OV in early May.  She is ready for FOB with BAL. Diane Harry WrightMD

## 2014-05-07 NOTE — Progress Notes (Signed)
Video Bronchoscopy done  Intervention Bronchial washing done Procedure tolerated well  Elsie Stain

## 2014-05-07 NOTE — Op Note (Signed)
Bronchoscopy Procedure Note  Date of Operation: 05/07/2014  Pre-op Diagnosis: Bronchiectasis, MAI  Post-op Diagnosis: Same  Surgeon: Elsie Stain  Anesthesia: Monitored Local Anesthesia with Sedation:    Versed 4 mg IVP;  Fentanyl 100 mcg IVP  Operation: Flexible fiberoptic bronchoscopy, diagnostic   Findings: Diffuse tracheobronchitis especially RML, left lingula with bronchial dilation and mild purulence.  Specimen: BAL RML   Estimated Blood Loss: none  Complications: none  Indications and History: The patient is a 68 y.o. female with Bronchiectasis.  The risks, benefits, complications, treatment options and expected outcomes were discussed with the patient.  The possibilities of reaction to medication, pulmonary aspiration, perforation of a viscus, bleeding, failure to diagnose a condition and creating a complication requiring transfusion or operation were discussed with the patient who freely signed the consent.    Description of Procedure: The patient was re-examined in the bronchoscopy suite and the site of surgery properly noted/marked.  The patient was identified as Diane Haney and the procedure verified as Flexible Fiberoptic Bronchoscopy.  A Time Out was held and the above information confirmed.   After the induction of topical nasopharyngeal anesthesia, the patient was positioned  and the bronchoscope was passed through the R  nares. The vocal cords were visualized and  1% buffered lidocaine 5 ml was topically placed onto the cords. The cords were normal. The scope was then passed into the trachea.  1% buffered lidocaine 5 ml was used topically on the carina.  Careful inspection of the tracheal lumen was accomplished. The scope was sequentially passed into the left main and then left upper and lower bronchi and segmental bronchi.    No specimens.  The scope was then withdrawn and advanced into the right main bronchus and then into the RUL, RML, and RLL bronchi and  segmental bronchi.   BAL RML  was done and there was one specimen, 64ml out of 100cc instilled returned.   Endobronchial findings: no endobronchial lesions, bronchiectasis, mild purulence esp in RML and Left lingula.   Trachea: Normal mucosa Carina: Normal mucosa Right main bronchus: Normal mucosa Right upper lobe bronchus: Edematous mucosa  Right middle lobe bronchus: bronchiectasis, dilated airways, mild purulence Right lower lobe bronchus: Normal mucosa Left main bronchus: Normal mucosa Left upper lobe bronchus: dilated airways in Left lingula Left lower lobe bronchus: Edematous mucosa  The Patient was taken to the Endoscopy Recovery area in satisfactory condition.  Attestation: I performed the procedure.  Elsie Stain 05/07/2014 8:16 AM

## 2014-05-07 NOTE — Discharge Instructions (Signed)
Flexible Bronchoscopy, Care After These instructions give you information on caring for yourself after your procedure. Your doctor may also give you more specific instructions. Call your doctor if you have any problems or questions after your procedure. HOME CARE  Do not eat or drink anything for 2 hours after your procedure. If you try to eat or drink before the medicine wears off, food or drink could go into your lungs. You could also burn yourself.  After 2 hours have passed and when you can cough and gag normally, you may eat soft food and drink liquids slowly.  The day after the test, you may eat your normal diet.  You may do your normal activities.  Keep all doctor visits. GET HELP RIGHT AWAY IF:  You get more and more short of breath.  You get lightheaded.  You feel like you are going to pass out (faint).  You have chest pain.  You have new problems that worry you.  You cough up more than a little blood.  You cough up more blood than before. MAKE SURE YOU:  Understand these instructions.  Will watch your condition.  Will get help right away if you are not doing well or get worse. Document Released: 09/24/2009 Document Revised: 09/17/2013 Document Reviewed: 08/01/2013 [facility] Patient Information 2014 [place].  Nothing to eat or drink until  10:30am  Today  05/07/2014

## 2014-05-08 ENCOUNTER — Encounter (HOSPITAL_COMMUNITY): Payer: Self-pay | Admitting: Critical Care Medicine

## 2014-05-09 LAB — CULTURE, BAL-QUANTITATIVE

## 2014-05-09 LAB — CULTURE, BAL-QUANTITATIVE W GRAM STAIN

## 2014-05-13 LAB — LEGIONELLA CULTURE

## 2014-05-19 ENCOUNTER — Other Ambulatory Visit: Payer: Self-pay | Admitting: Internal Medicine

## 2014-05-19 NOTE — Telephone Encounter (Signed)
Done hardcopy to robin  

## 2014-05-19 NOTE — Telephone Encounter (Signed)
Faxed hardcopy to [facility]

## 2014-06-03 LAB — FUNGUS CULTURE W SMEAR: Fungal Smear: NONE SEEN

## 2014-06-19 LAB — AFB CULTURE WITH SMEAR (NOT AT ARMC): Acid Fast Smear: NONE SEEN

## 2014-06-22 NOTE — Progress Notes (Signed)
Quick Note:  Called, spoke with pt. Informed her of results and recs per Dr. Joya Gaskins. She verbalized understanding and voiced no further questions or concerns at this time. ______

## 2014-06-30 ENCOUNTER — Other Ambulatory Visit: Payer: Self-pay | Admitting: Internal Medicine

## 2014-06-30 NOTE — Telephone Encounter (Signed)
Faxed hardcopy to [facility]

## 2014-06-30 NOTE — Telephone Encounter (Signed)
Done hardcopy to robin  

## 2014-07-24 DIAGNOSIS — M722 Plantar fascial fibromatosis: Secondary | ICD-10-CM | POA: Diagnosis not present

## 2014-07-24 DIAGNOSIS — I7300 Raynaud's syndrome without gangrene: Secondary | ICD-10-CM | POA: Diagnosis not present

## 2014-07-28 DIAGNOSIS — H2510 Age-related nuclear cataract, unspecified eye: Secondary | ICD-10-CM | POA: Diagnosis not present

## 2014-07-28 DIAGNOSIS — Z792 Long term (current) use of antibiotics: Secondary | ICD-10-CM | POA: Diagnosis not present

## 2014-07-29 ENCOUNTER — Ambulatory Visit (INDEPENDENT_AMBULATORY_CARE_PROVIDER_SITE_OTHER): Payer: Medicare Other | Admitting: Infectious Diseases

## 2014-07-29 ENCOUNTER — Encounter: Payer: Self-pay | Admitting: Infectious Diseases

## 2014-07-29 VITALS — BP 145/79 | HR 84 | Temp 98.5°F | Ht 65.0 in | Wt 112.0 lb

## 2014-07-29 DIAGNOSIS — Z23 Encounter for immunization: Secondary | ICD-10-CM

## 2014-07-29 DIAGNOSIS — A310 Pulmonary mycobacterial infection: Secondary | ICD-10-CM | POA: Diagnosis not present

## 2014-07-29 NOTE — Progress Notes (Signed)
   Subjective:    Patient ID: Diane Haney, female    DOB: Mar 01, 1946, 68 y.o.   MRN: [id number]  HPI 68 yo F with hx endoscopy in May 2005 which grew MAI (S- clarithro, eth, RIF 8.0, synergy positive for E/R) and fortuitum (S- cipro/smikacin/tigecycline).She began antibiotics May 2005 and was treated with ETH/Azithro and cipro until March 2007. Did well until July 2007 when she had a CT scan which showed some "activation" of MAI.  She was restarted on her MAI therapy in Nov 2008. CT scan 7-09 " 1. Stable mild lingular bronchiectasis. Stable tiny associated left lung nodules, which are consistent with a postinflammatory  etiology. 2. No active disease." Seen in ID November of 2009, was doing well and was taken off meds for MAI.  Seen November 2011 and felt like her "MAC is back".  She had f/u CT 10-20-10: 1. New tree-in-bud opacities in the right middle lobe since the  prior CT from July, 2009, consistent with MAI. 2. Stable scarring, bronchiectasis, and tree-in-bud opacities in the inferior left upper lobe adjacent to the major fissure. Stable scar and bronchiectasis medially in the right middle lobe. No new pulmonary parenchymal abnormalities elsewhere.  3. Stable hyperinflation consistent with COPD and/or asthma. 4. No significant lymphadenopathy.  She was restarted on MAI rx- Azithro/ETH/Rif.  She has been dx with breast cancer (BRCA-, resected october 2013) and has received XRT/tamoxifen for 5 years. Now on letrizole.  As part of her f/u she underwent CT (06-19-13): Bilateral areas of bronchiectasis and peripheral tree in bud nodularity are identified. Findings are consistent with indolent  atypical infection such as MAI. When compared with the previous  exam there is been mild disease progression throughout the right lung.  She was restarted on her E/A/R in August of 2014.  She underwent repeat BAL on 05-07-14: Cx all (-), her cytology was (-) as well.  Her SOB continues, unchanged. No sputum  production. Has been on her anti-MAI rx for 1 year now.  Has occas chills. Has 1 hr after taking her BP rx every AM (irbesartan and amlodipine).    Will have repeat LFTs next week at her oncologist visit.  Had ophtho exam this week.   Review of Systems  Constitutional: Negative for appetite change and unexpected weight change.  Respiratory: Positive for cough and shortness of breath.        Objective:   Physical Exam  Constitutional: She appears well-developed and well-nourished.  HENT:  Mouth/Throat: No oropharyngeal exudate.  Eyes: EOM are normal. Pupils are equal, round, and reactive to light.  Neck: Neck supple.  Cardiovascular: Normal rate, regular rhythm and normal heart sounds.   Pulmonary/Chest: Effort normal. She has decreased breath sounds.  Abdominal: Soft. Bowel sounds are normal. She exhibits no distension. There is no tenderness.  Musculoskeletal: She exhibits no edema.  Lymphadenopathy:    She has no cervical adenopathy.          Assessment & Plan:

## 2014-07-29 NOTE — Assessment & Plan Note (Signed)
She appears to be doing well. We discussed stopping her meds. She does not make sputum. So will repeat her CT scan in 6 months and make medication decision based on that. She will have her labs done next week at oncology.

## 2014-07-31 DIAGNOSIS — Z09 Encounter for follow-up examination after completed treatment for conditions other than malignant neoplasm: Secondary | ICD-10-CM | POA: Diagnosis not present

## 2014-07-31 DIAGNOSIS — C50519 Malignant neoplasm of lower-outer quadrant of unspecified female breast: Secondary | ICD-10-CM | POA: Diagnosis not present

## 2014-08-03 DIAGNOSIS — Z09 Encounter for follow-up examination after completed treatment for conditions other than malignant neoplasm: Secondary | ICD-10-CM | POA: Diagnosis not present

## 2014-08-03 DIAGNOSIS — M810 Age-related osteoporosis without current pathological fracture: Secondary | ICD-10-CM | POA: Diagnosis not present

## 2014-08-03 DIAGNOSIS — Z86718 Personal history of other venous thrombosis and embolism: Secondary | ICD-10-CM | POA: Diagnosis not present

## 2014-08-03 DIAGNOSIS — C50919 Malignant neoplasm of unspecified site of unspecified female breast: Secondary | ICD-10-CM | POA: Diagnosis not present

## 2014-08-03 DIAGNOSIS — Z170 Estrogen receptor positive status [ER+]: Secondary | ICD-10-CM | POA: Diagnosis not present

## 2014-08-03 DIAGNOSIS — Z5181 Encounter for therapeutic drug level monitoring: Secondary | ICD-10-CM | POA: Diagnosis not present

## 2014-08-03 DIAGNOSIS — Z79811 Long term (current) use of aromatase inhibitors: Secondary | ICD-10-CM | POA: Diagnosis not present

## 2014-08-03 DIAGNOSIS — Z853 Personal history of malignant neoplasm of breast: Secondary | ICD-10-CM | POA: Diagnosis not present

## 2014-08-05 DIAGNOSIS — Z1211 Encounter for screening for malignant neoplasm of colon: Secondary | ICD-10-CM | POA: Diagnosis not present

## 2014-08-05 DIAGNOSIS — M810 Age-related osteoporosis without current pathological fracture: Secondary | ICD-10-CM | POA: Diagnosis not present

## 2014-08-05 DIAGNOSIS — K219 Gastro-esophageal reflux disease without esophagitis: Secondary | ICD-10-CM | POA: Diagnosis not present

## 2014-08-05 DIAGNOSIS — Z8371 Family history of colonic polyps: Secondary | ICD-10-CM | POA: Diagnosis not present

## 2014-08-18 DIAGNOSIS — H2181 Floppy iris syndrome: Secondary | ICD-10-CM | POA: Diagnosis not present

## 2014-08-18 DIAGNOSIS — H269 Unspecified cataract: Secondary | ICD-10-CM | POA: Diagnosis not present

## 2014-08-18 DIAGNOSIS — H2510 Age-related nuclear cataract, unspecified eye: Secondary | ICD-10-CM | POA: Diagnosis not present

## 2014-08-20 ENCOUNTER — Encounter: Payer: Self-pay | Admitting: Critical Care Medicine

## 2014-08-20 ENCOUNTER — Ambulatory Visit (INDEPENDENT_AMBULATORY_CARE_PROVIDER_SITE_OTHER): Payer: Medicare Other | Admitting: Critical Care Medicine

## 2014-08-20 VITALS — BP 118/72 | HR 72 | Ht 65.5 in | Wt 113.0 lb

## 2014-08-20 DIAGNOSIS — J479 Bronchiectasis, uncomplicated: Secondary | ICD-10-CM

## 2014-08-20 DIAGNOSIS — T50905S Adverse effect of unspecified drugs, medicaments and biological substances, sequela: Secondary | ICD-10-CM

## 2014-08-20 LAB — HEPATIC FUNCTION PANEL
ALBUMIN: 4.6 g/dL (ref 3.5–5.2)
ALT: 15 U/L (ref 0–35)
AST: 19 U/L (ref 0–37)
Alkaline Phosphatase: 29 U/L — ABNORMAL LOW (ref 39–117)
BILIRUBIN TOTAL: 0.4 mg/dL (ref 0.2–1.2)
Bilirubin, Direct: 0.1 mg/dL (ref 0.0–0.3)
Indirect Bilirubin: 0.3 mg/dL (ref 0.2–1.2)
Total Protein: 7.6 g/dL (ref 6.0–8.3)

## 2014-08-20 NOTE — Assessment & Plan Note (Signed)
Stable bronchiectasis with associated Mycobacterium avium infection now under treatment Plan Antibiotic therapy per infectious disease

## 2014-08-20 NOTE — Progress Notes (Signed)
Subjective:    Patient ID: Diane Haney, female    DOB: 1946-11-05, 68 y.o.   MRN: [id number]  HPI  08/20/2014 Chief Complaint  Patient presents with  . Follow-up    Pt states her SOB is the same.  No other breathing complaints at this time.    No change in dyspnea.  No wheezing or mucus.  No chest pain or fever. On thrice weekly ABX per ID.  Plan to stay on ABX for 63months.  Pt has received 12 months to date Now on bid dexilant 60 bid. Sees [facility] rx GERD.   Review of Systems  Constitutional:   No  weight loss, no  night sweats,  no Fever, occ  Chill,  ++has  Fatigue++lassitude. HEENT:   No headaches,  Difficulty swallowing,  Tooth/dental problems,  Sore throat,                No sneezing, itching, ear ache, nasal congestion, occ  post nasal drip,   CV:  No chest pain,  Orthopnea, PND, swelling in lower extremities, anasarca, dizziness, palpitations  GI  No heartburn, indigestion, abdominal pain, nausea, vomiting, diarrhea, change in bowel habits, loss of appetite  Resp: Notes  shortness of breath with exertion not at rest.  No  excess mucus, no  productive cough,  Notes a  non-productive cough,  No coughing up of blood.  No change in color of mucus.  No wheezing.  No chest wall deformity  Skin: no rash or lesions.  GU: no dysuria, change in color of urine, no urgency or frequency.  No flank pain.  MS:  No joint pain or swelling.  No decreased range of motion.  No back pain.  Psych:  No change in mood or affect. No depression or anxiety.  No memory loss.     Objective:   Physical Exam BP 118/72  Pulse 72  Ht 5' 5.5" (1.664 m)  Wt 113 lb (51.256 kg)  BMI 18.51 kg/m2  SpO2 99%  Gen: Pleasant, well-nourished, in no distress,  normal affect  ENT: No lesions,  mouth clear,  oropharynx clear, no postnasal drip  Neck: No JVD, no TMG, no carotid bruits  Lungs: No use of accessory muscles, no dullness to percussion, distant BS, exp wheeze RUL and LUL areas but  improved  Cardiovascular: RRR, heart sounds normal, no murmur or gallops, no peripheral edema  Abdomen: soft and NT, no HSM,  BS normal  Musculoskeletal: No deformities, no cyanosis or clubbing  Neuro: alert, non focal  Skin: Warm, no lesions or rashes     No results found.  Assessment & Plan:   BRONCHIECTASIS Stable bronchiectasis with associated Mycobacterium avium infection now under treatment Plan Antibiotic therapy per infectious disease    Updated Medication List Outpatient Encounter Prescriptions as of 08/20/2014  Medication Sig  . amLODipine (NORVASC) 5 MG tablet Take 1 tablet (5 mg total) by mouth daily.  Marland Kitchen aspirin 81 MG EC tablet Take 81 mg by mouth daily.    Marland Kitchen azithromycin (ZITHROMAX) 500 MG tablet Take 1 tablet (500 mg total) by mouth 3 (three) times a week.  . calcium citrate-vitamin D (CITRACAL+D) 315-200 MG-UNIT per tablet Take 2 tablets by mouth daily.   . carisoprodol (SOMA) 350 MG tablet TAKE 1 TABLET BY MOUTH EVERY DAY  . denosumab (PROLIA) 60 MG/ML SOLN injection Inject 60 mg into the skin every 6 (six) months. Administer in upper arm, thigh, or abdomen  . DEXILANT 60 MG capsule  Take 1 capsule by mouth 2 (two) times daily.   Marland Kitchen ethambutol (MYAMBUTOL) 400 MG tablet Take 3 tablets (1,200 mg total) by mouth 3 (three) times a week. Monday, Wednesday, Friday  . irbesartan (AVAPRO) 300 MG tablet Take 1 tablet (300 mg total) by mouth daily.  Marland Kitchen letrozole (FEMARA) 2.5 MG tablet Take 2.5 mg by mouth daily.  . Methylcellulose, Laxative, (CITRUCEL PO) Take 2 tablets by mouth daily.   . rifampin (RIFADIN) 300 MG capsule Take 2 capsules (600 mg total) by mouth 3 (three) times a week. Monday, Wednesday, Friday  . simvastatin (ZOCOR) 40 MG tablet 1/2 tablet daily  . temazepam (RESTORIL) 15 MG capsule TAKE 1-2 CAPSULES BY MOUTH EVERY NIGHT AT BEDTIME AS NEEDED FOR SLEEP

## 2014-08-20 NOTE — Patient Instructions (Signed)
Labs today: hepatic function panel No change in medications Return 6 months

## 2014-08-21 NOTE — Progress Notes (Signed)
Quick Note:  Call pt and tell her labs are ok, No change in medications ______ 

## 2014-08-21 NOTE — Progress Notes (Signed)
Quick Note:  Called, spoke with pt. Informed her of lab results and recs per Dr. Wright. She verbalized understanding and voiced no further questions or concerns at this time. ______ 

## 2014-09-02 DIAGNOSIS — H2510 Age-related nuclear cataract, unspecified eye: Secondary | ICD-10-CM | POA: Diagnosis not present

## 2014-09-08 DIAGNOSIS — H269 Unspecified cataract: Secondary | ICD-10-CM | POA: Diagnosis not present

## 2014-09-08 DIAGNOSIS — H2510 Age-related nuclear cataract, unspecified eye: Secondary | ICD-10-CM | POA: Diagnosis not present

## 2014-10-05 DIAGNOSIS — K2950 Unspecified chronic gastritis without bleeding: Secondary | ICD-10-CM | POA: Diagnosis not present

## 2014-10-05 DIAGNOSIS — K210 Gastro-esophageal reflux disease with esophagitis: Secondary | ICD-10-CM | POA: Diagnosis not present

## 2014-10-05 DIAGNOSIS — K648 Other hemorrhoids: Secondary | ICD-10-CM | POA: Diagnosis not present

## 2014-10-05 DIAGNOSIS — K449 Diaphragmatic hernia without obstruction or gangrene: Secondary | ICD-10-CM | POA: Diagnosis not present

## 2014-10-05 DIAGNOSIS — K649 Unspecified hemorrhoids: Secondary | ICD-10-CM | POA: Diagnosis not present

## 2014-10-05 DIAGNOSIS — Z8371 Family history of colonic polyps: Secondary | ICD-10-CM | POA: Diagnosis not present

## 2014-10-05 DIAGNOSIS — K209 Esophagitis, unspecified: Secondary | ICD-10-CM | POA: Diagnosis not present

## 2014-10-05 DIAGNOSIS — K2940 Chronic atrophic gastritis without bleeding: Secondary | ICD-10-CM | POA: Diagnosis not present

## 2014-10-05 DIAGNOSIS — K317 Polyp of stomach and duodenum: Secondary | ICD-10-CM | POA: Diagnosis not present

## 2014-10-05 DIAGNOSIS — D131 Benign neoplasm of stomach: Secondary | ICD-10-CM | POA: Diagnosis not present

## 2014-10-07 DIAGNOSIS — R10816 Epigastric abdominal tenderness: Secondary | ICD-10-CM | POA: Insufficient documentation

## 2014-10-07 DIAGNOSIS — M205X9 Other deformities of toe(s) (acquired), unspecified foot: Secondary | ICD-10-CM | POA: Insufficient documentation

## 2014-10-07 DIAGNOSIS — R053 Chronic cough: Secondary | ICD-10-CM | POA: Insufficient documentation

## 2014-10-07 DIAGNOSIS — M792 Neuralgia and neuritis, unspecified: Secondary | ICD-10-CM | POA: Insufficient documentation

## 2014-10-07 DIAGNOSIS — H903 Sensorineural hearing loss, bilateral: Secondary | ICD-10-CM | POA: Insufficient documentation

## 2014-10-07 DIAGNOSIS — G43909 Migraine, unspecified, not intractable, without status migrainosus: Secondary | ICD-10-CM | POA: Insufficient documentation

## 2014-10-07 DIAGNOSIS — I7300 Raynaud's syndrome without gangrene: Secondary | ICD-10-CM | POA: Insufficient documentation

## 2014-10-07 DIAGNOSIS — M2020 Hallux rigidus, unspecified foot: Secondary | ICD-10-CM | POA: Insufficient documentation

## 2014-10-07 DIAGNOSIS — R300 Dysuria: Secondary | ICD-10-CM | POA: Insufficient documentation

## 2014-10-07 DIAGNOSIS — M19079 Primary osteoarthritis, unspecified ankle and foot: Secondary | ICD-10-CM | POA: Insufficient documentation

## 2014-10-07 DIAGNOSIS — A319 Mycobacterial infection, unspecified: Secondary | ICD-10-CM | POA: Insufficient documentation

## 2014-10-07 DIAGNOSIS — C50919 Malignant neoplasm of unspecified site of unspecified female breast: Secondary | ICD-10-CM | POA: Insufficient documentation

## 2014-10-07 DIAGNOSIS — L03039 Cellulitis of unspecified toe: Secondary | ICD-10-CM | POA: Insufficient documentation

## 2014-10-07 DIAGNOSIS — M25279 Flail joint, unspecified ankle and foot: Secondary | ICD-10-CM | POA: Insufficient documentation

## 2014-10-07 DIAGNOSIS — N952 Postmenopausal atrophic vaginitis: Secondary | ICD-10-CM | POA: Insufficient documentation

## 2014-10-07 DIAGNOSIS — M722 Plantar fascial fibromatosis: Secondary | ICD-10-CM | POA: Insufficient documentation

## 2014-10-07 DIAGNOSIS — R928 Other abnormal and inconclusive findings on diagnostic imaging of breast: Secondary | ICD-10-CM | POA: Insufficient documentation

## 2014-10-07 DIAGNOSIS — N6009 Solitary cyst of unspecified breast: Secondary | ICD-10-CM | POA: Insufficient documentation

## 2014-10-07 DIAGNOSIS — R499 Unspecified voice and resonance disorder: Secondary | ICD-10-CM | POA: Insufficient documentation

## 2014-10-07 DIAGNOSIS — R7309 Other abnormal glucose: Secondary | ICD-10-CM | POA: Insufficient documentation

## 2014-10-07 DIAGNOSIS — R05 Cough: Secondary | ICD-10-CM | POA: Insufficient documentation

## 2014-10-07 DIAGNOSIS — R10811 Right upper quadrant abdominal tenderness: Secondary | ICD-10-CM | POA: Insufficient documentation

## 2014-10-19 DIAGNOSIS — C50919 Malignant neoplasm of unspecified site of unspecified female breast: Secondary | ICD-10-CM | POA: Diagnosis not present

## 2014-10-19 DIAGNOSIS — M810 Age-related osteoporosis without current pathological fracture: Secondary | ICD-10-CM | POA: Diagnosis not present

## 2014-10-23 ENCOUNTER — Other Ambulatory Visit: Payer: Self-pay | Admitting: Infectious Diseases

## 2014-10-23 ENCOUNTER — Encounter: Payer: Self-pay | Admitting: Infectious Diseases

## 2014-10-23 ENCOUNTER — Other Ambulatory Visit: Payer: Self-pay | Admitting: *Deleted

## 2014-10-23 DIAGNOSIS — A310 Pulmonary mycobacterial infection: Secondary | ICD-10-CM

## 2014-10-23 MED ORDER — ETHAMBUTOL HCL 400 MG PO TABS: 1200.0000 mg | ORAL_TABLET | ORAL | Status: DC

## 2014-11-10 ENCOUNTER — Other Ambulatory Visit: Payer: Self-pay | Admitting: Internal Medicine

## 2014-11-12 DIAGNOSIS — Z85828 Personal history of other malignant neoplasm of skin: Secondary | ICD-10-CM | POA: Diagnosis not present

## 2014-11-12 DIAGNOSIS — Z08 Encounter for follow-up examination after completed treatment for malignant neoplasm: Secondary | ICD-10-CM | POA: Diagnosis not present

## 2014-11-12 DIAGNOSIS — L570 Actinic keratosis: Secondary | ICD-10-CM | POA: Diagnosis not present

## 2014-11-18 ENCOUNTER — Other Ambulatory Visit: Payer: Self-pay | Admitting: Internal Medicine

## 2014-11-18 NOTE — Telephone Encounter (Signed)
Done hardcopy to [facility] 

## 2014-11-18 NOTE — Telephone Encounter (Signed)
Faxed script back to [facility].../lmb 

## 2014-11-20 DIAGNOSIS — A318 Other mycobacterial infections: Secondary | ICD-10-CM | POA: Diagnosis not present

## 2014-11-20 DIAGNOSIS — J449 Chronic obstructive pulmonary disease, unspecified: Secondary | ICD-10-CM | POA: Diagnosis not present

## 2014-11-20 DIAGNOSIS — R071 Chest pain on breathing: Secondary | ICD-10-CM | POA: Diagnosis not present

## 2014-11-20 DIAGNOSIS — R0789 Other chest pain: Secondary | ICD-10-CM | POA: Diagnosis not present

## 2014-11-23 DIAGNOSIS — K219 Gastro-esophageal reflux disease without esophagitis: Secondary | ICD-10-CM | POA: Diagnosis not present

## 2014-11-25 ENCOUNTER — Encounter: Payer: Self-pay | Admitting: Critical Care Medicine

## 2014-11-25 ENCOUNTER — Ambulatory Visit (INDEPENDENT_AMBULATORY_CARE_PROVIDER_SITE_OTHER): Payer: Medicare Other | Admitting: Critical Care Medicine

## 2014-11-25 VITALS — BP 116/64 | HR 76 | Temp 98.3°F | Ht 65.5 in | Wt 114.0 lb

## 2014-11-25 DIAGNOSIS — A310 Pulmonary mycobacterial infection: Secondary | ICD-10-CM | POA: Diagnosis not present

## 2014-11-25 NOTE — Patient Instructions (Signed)
No change in medications You do not have pneumonia Return 4 months

## 2014-11-25 NOTE — Progress Notes (Signed)
Subjective:    Patient ID: Diane Haney, female    DOB: 03/13/1946, 68 y.o.   MRN: [id number]  HPI 11/25/2014 Chief Complaint  Patient presents with  . Follow-up    Pt had cxr Friday at [facility], was told she may have pna.  Pt having sob, nonprod cough, some chest tightness.    Pt noted after carrying groceries, pain noted in Left side, then went to [facility] and CXR abn.  No change in Treatment  Pt is more dyspneic but cough is not as loose and is tight. Notes non prod cough No fever /chills/sweats.   Xray to my view: no acute process . Notes back pain , is not new.   Review of Systems Constitutional:   No  weight loss, no  night sweats,  no Fever, occ  Chill,  ++has  Fatigue++lassitude. HEENT:   No headaches,  Difficulty swallowing,  Tooth/dental problems,  Sore throat,                No sneezing, itching, ear ache, nasal congestion, occ  post nasal drip,   CV:  No chest pain,  Orthopnea, PND, swelling in lower extremities, anasarca, dizziness, palpitations  GI  No heartburn, indigestion, abdominal pain, nausea, vomiting, diarrhea, change in bowel habits, loss of appetite  Resp: Notes  shortness of breath with exertion not at rest.  No  excess mucus, no  productive cough,  Notes a  non-productive cough,  No coughing up of blood.  No change in color of mucus.  No wheezing.  No chest wall deformity  Skin: no rash or lesions.  GU: no dysuria, change in color of urine, no urgency or frequency.  No flank pain.  MS:  No joint pain or swelling.  No decreased range of motion.  No back pain.  Psych:  No change in mood or affect. No depression or anxiety.  No memory loss.     Objective:   Physical ExamBP 116/64 mmHg  Pulse 76  Temp(Src) 98.3 F (36.8 C) (Oral)  Ht 5' 5.5" (1.664 m)  Wt 114 lb (51.71 kg)  BMI 18.68 kg/m2  SpO2 93%  Gen: Pleasant, well-nourished, in no distress,  normal affect  ENT: No lesions,  mouth clear,  oropharynx clear, no postnasal drip  Neck: No  JVD, no TMG, no carotid bruits  Lungs: No use of accessory muscles, no dullness to percussion, distant BS, exp wheeze RUL and LUL areas but improved  Cardiovascular: RRR, heart sounds normal, no murmur or gallops, no peripheral edema  Abdomen: soft and NT, no HSM,  BS normal  Musculoskeletal: No deformities, no cyanosis or clubbing  Neuro: alert, non focal  Skin: Warm, no lesions or rashes   CXR reviewed: no acute process from 11/2014    No results found.  Assessment & Plan:   Mycobacterium avium-intracellulare infection Bronchiectasis and MAI upper lobes Now on Azithromycin/Rif/ethambutol per ID Plan Cont same No evident PNA at this time Note current CXR neg for PNA No other ABX needed Pain is musculoskeletal    Updated Medication List Outpatient Encounter Prescriptions as of 11/25/2014  Medication Sig  . amLODipine (NORVASC) 5 MG tablet TAKE 1 TABLET BY MOUTH EVERY DAY  . aspirin 81 MG EC tablet Take 81 mg by mouth daily.    Marland Kitchen azithromycin (ZITHROMAX) 500 MG tablet Take 1 tablet (500 mg total) by mouth 3 (three) times a week.  . calcium citrate-vitamin D (CITRACAL+D) 315-200 MG-UNIT per tablet Take 2 tablets by mouth  daily.   . denosumab (PROLIA) 60 MG/ML SOLN injection Inject 60 mg into the skin every 6 (six) months. Administer in upper arm, thigh, or abdomen  . ethambutol (MYAMBUTOL) 400 MG tablet Take 3 tablets (1,200 mg total) by mouth 3 (three) times a week. Monday, Wednesday, Friday  . famotidine (PEPCID AC) 10 MG chewable tablet Chew 20 mg by mouth at bedtime and may repeat dose one time if needed.  . irbesartan (AVAPRO) 300 MG tablet TAKE 1 TABLET BY MOUTH EVERY DAY  . letrozole (FEMARA) 2.5 MG tablet Take 2.5 mg by mouth daily.  . Methylcellulose, Laxative, (CITRUCEL PO) Take 2 tablets by mouth daily.   . pantoprazole (PROTONIX) 40 MG tablet Take 40 mg by mouth 2 (two) times daily.  . rifampin (RIFADIN) 300 MG capsule Take 2 capsules (600 mg total) by mouth 3  (three) times a week. Monday, Wednesday, Friday  . simvastatin (ZOCOR) 40 MG tablet 1/2 tablet daily  . [DISCONTINUED] carisoprodol (SOMA) 350 MG tablet TAKE 1 TABLET BY MOUTH EVERY DAY  . [DISCONTINUED] temazepam (RESTORIL) 15 MG capsule TAKE 1-2 CAPSULES BY MOUTH EVERY NIGHT AT BEDTIME AS NEEDED FOR SLEEP  . Calcium Carbonate-Vitamin D 600-200 MG-UNIT CAPS Take 2 tablets by mouth 2 (two) times daily.  . [DISCONTINUED] DEXILANT 60 MG capsule Take 1 capsule by mouth 2 (two) times daily.

## 2014-11-26 ENCOUNTER — Other Ambulatory Visit (INDEPENDENT_AMBULATORY_CARE_PROVIDER_SITE_OTHER): Payer: Medicare Other

## 2014-11-26 ENCOUNTER — Ambulatory Visit (INDEPENDENT_AMBULATORY_CARE_PROVIDER_SITE_OTHER): Payer: Medicare Other | Admitting: Internal Medicine

## 2014-11-26 ENCOUNTER — Encounter: Payer: Self-pay | Admitting: Internal Medicine

## 2014-11-26 VITALS — BP 120/86 | HR 104 | Temp 98.4°F | Ht 65.5 in | Wt 112.8 lb

## 2014-11-26 DIAGNOSIS — E785 Hyperlipidemia, unspecified: Secondary | ICD-10-CM

## 2014-11-26 DIAGNOSIS — J449 Chronic obstructive pulmonary disease, unspecified: Secondary | ICD-10-CM | POA: Diagnosis not present

## 2014-11-26 DIAGNOSIS — I10 Essential (primary) hypertension: Secondary | ICD-10-CM | POA: Diagnosis not present

## 2014-11-26 DIAGNOSIS — R7302 Impaired glucose tolerance (oral): Secondary | ICD-10-CM | POA: Diagnosis not present

## 2014-11-26 LAB — BASIC METABOLIC PANEL
BUN: 22 mg/dL (ref 6–23)
CHLORIDE: 102 meq/L (ref 96–112)
CO2: 27 meq/L (ref 19–32)
CREATININE: 0.9 mg/dL (ref 0.4–1.2)
Calcium: 9.7 mg/dL (ref 8.4–10.5)
GFR: 65.12 mL/min (ref >60.00)
Glucose, Bld: 113 mg/dL — ABNORMAL HIGH (ref 70–99)
Potassium: 4.2 mEq/L (ref 3.5–5.1)
Sodium: 140 mEq/L (ref 135–145)

## 2014-11-26 LAB — CBC WITH DIFFERENTIAL/PLATELET
BASOS ABS: 0 10*3/uL (ref 0.0–0.1)
BASOS PCT: 0.4 % (ref 0.0–3.0)
EOS ABS: 0.1 10*3/uL (ref 0.0–0.7)
Eosinophils Relative: 1.1 % (ref 0.0–5.0)
HCT: 44.1 % (ref 36.0–46.0)
Hemoglobin: 14.6 g/dL (ref 12.0–15.0)
Lymphocytes Relative: 13.5 % (ref 12.0–46.0)
Lymphs Abs: 0.9 10*3/uL (ref 0.7–4.0)
MCHC: 33 g/dL (ref 30.0–36.0)
MCV: 96.7 fl (ref 78.0–100.0)
Monocytes Absolute: 0.7 10*3/uL (ref 0.1–1.0)
Monocytes Relative: 11 % (ref 3.0–12.0)
NEUTROS PCT: 74 % (ref 43.0–77.0)
Neutro Abs: 4.8 10*3/uL (ref 1.4–7.7)
PLATELETS: 215 10*3/uL (ref 150.0–400.0)
RBC: 4.57 Mil/uL (ref 3.87–5.11)
RDW: 13.8 % (ref 11.5–15.5)
WBC: 6.5 10*3/uL (ref 4.0–10.5)

## 2014-11-26 LAB — URINALYSIS, ROUTINE W REFLEX MICROSCOPIC
BILIRUBIN URINE: NEGATIVE
Ketones, ur: NEGATIVE
Leukocytes, UA: NEGATIVE
NITRITE: NEGATIVE
Specific Gravity, Urine: 1.015 (ref 1.000–1.030)
Total Protein, Urine: NEGATIVE
UROBILINOGEN UA: 0.2 (ref 0.0–1.0)
Urine Glucose: NEGATIVE
pH: 7 (ref 5.0–8.0)

## 2014-11-26 LAB — LIPID PANEL
Cholesterol: 221 mg/dL — ABNORMAL HIGH (ref 0–200)
HDL: 84.5 mg/dL (ref >39.00)
LDL Cholesterol: 126 mg/dL — ABNORMAL HIGH (ref 0–99)
NONHDL: 136.5
TRIGLYCERIDES: 55 mg/dL (ref 0.0–149.0)
Total CHOL/HDL Ratio: 3
VLDL: 11 mg/dL (ref 0.0–40.0)

## 2014-11-26 LAB — HEPATIC FUNCTION PANEL
ALT: 18 U/L (ref 0–35)
AST: 23 U/L (ref 0–37)
Albumin: 4.5 g/dL (ref 3.5–5.2)
Alkaline Phosphatase: 33 U/L — ABNORMAL LOW (ref 39–117)
BILIRUBIN DIRECT: 0 mg/dL (ref 0.0–0.3)
TOTAL PROTEIN: 7.7 g/dL (ref 6.0–8.3)
Total Bilirubin: 0.6 mg/dL (ref 0.2–1.2)

## 2014-11-26 LAB — TSH: TSH: 3.95 u[IU]/mL (ref 0.35–4.50)

## 2014-11-26 LAB — HEMOGLOBIN A1C: Hgb A1c MFr Bld: 6 % (ref 4.6–6.5)

## 2014-11-26 MED ORDER — CARISOPRODOL 350 MG PO TABS: ORAL_TABLET | ORAL | Status: DC

## 2014-11-26 MED ORDER — TEMAZEPAM 15 MG PO CAPS: ORAL_CAPSULE | ORAL | Status: DC

## 2014-11-26 NOTE — Progress Notes (Signed)
Pre visit review using our clinic review tool, if applicable. No additional management support is needed unless otherwise documented below in the visit note. 

## 2014-11-26 NOTE — Assessment & Plan Note (Signed)
stable overall by history and exam, recent data reviewed with pt, and pt to continue medical treatment as before,  to f/u any worsening symptoms or concerns SpO2 Readings from Last 3 Encounters:  11/26/14 97%  11/25/14 93%  08/20/14 99%

## 2014-11-26 NOTE — Assessment & Plan Note (Signed)
stable overall by history and exam, recent data reviewed with pt, and pt to continue medical treatment as before,  to f/u any worsening symptoms or concerns Lab Results  Component Value Date   LDLCALC 99 11/12/2012

## 2014-11-26 NOTE — Assessment & Plan Note (Signed)
Bronchiectasis and MAI upper lobes Now on Azithromycin/Rif/ethambutol per ID Plan Cont same No evident PNA at this time Note current CXR neg for PNA No other ABX needed Pain is musculoskeletal

## 2014-11-26 NOTE — Assessment & Plan Note (Signed)
stable overall by history and exam, recent data reviewed with pt, and pt to continue medical treatment as before,  to f/u any worsening symptoms or concerns BP Readings from Last 3 Encounters:  11/26/14 120/86  11/25/14 116/64  08/20/14 118/72

## 2014-11-26 NOTE — Assessment & Plan Note (Signed)
stable overall by history and exam, recent data reviewed with pt, and pt to continue medical treatment as before,  to f/u any worsening symptoms or concerns Lab Results  Component Value Date   HGBA1C 5.9 11/14/2013

## 2014-11-26 NOTE — Progress Notes (Signed)
Subjective:    Patient ID: Diane Haney, female    DOB: 1946-11-30, 68 y.o.   MRN: [id number]  HPI  Here for yearly f/u;  Overall doing ok;  Pt denies CP, worsening SOB, DOE, wheezing, orthopnea, PND, worsening LE edema, palpitations, dizziness or syncope.  Pt denies neurological change such as new headache, facial or extremity weakness.  Pt denies polydipsia, polyuria, or low sugar symptoms. Pt states overall good compliance with treatment and medications, good tolerability, and has been trying to follow lower cholesterol diet.  Pt denies worsening depressive symptoms, suicidal ideation or panic. No fever, night sweats, wt loss, loss of appetite, or other constitutional symptoms.  Pt states good ability with ADL's, has low fall risk, home safety reviewed and adequate, no other significant changes in hearing or vision, and only occasionally active with exercise. Still has ongoing FMS pain but tolerable.  Also c/o an isolated back muscle spasm after carrying groceries in a bag to left arm last wk, still quite painful, and muscle still swollen.  No recent falls or neuritic symptoms Past Medical History  Diagnosis Date  . COPD (chronic obstructive pulmonary disease)   . Hyperlipidemia   . Hypertension   . Pulmonary Mycobacterium avium complex (MAC) infection   . Bronchiectasis   . History of shingles   . GERD (gastroesophageal reflux disease)   . Fibromyalgia   . Migraine   . Osteopenia   . Squamous cell skin cancer, multiple sites   . DVT, lower extremity     LLE  . Breast cancer 11/12/2012    Dx oct 2013 - [place] - Invasive ductal carcinoma, s/o bilat mastectomy with + margin  - also for XRT soon  . Chronic fatigue fibromyalgia syndrome 11/12/2012  . Impaired glucose tolerance 11/14/2013  . Osteoporosis 10/16/2007    Qualifier: Diagnosis of  By: Jenny Reichmann MD, Hunt Oris    Past Surgical History  Procedure Laterality Date  . Shoulder impingement    . Foot surgery    .  Mastectomy bilateral oct 2013    . Video bronchoscopy N/A 05/07/2014    Procedure: VIDEO BRONCHOSCOPY WITHOUT FLUORO;  Surgeon: Elsie Stain, MD;  Location: [facility];  Service: Cardiopulmonary;  Laterality: N/A;    reports that she quit smoking about 28 years ago. Her smoking use included Cigarettes. She has a 25 pack-year smoking history. She has never used smokeless tobacco. She reports that she does not drink alcohol or use illicit drugs. family history includes Breast cancer in her other; Colon polyps in her father; Heart disease in her other; Hyperlipidemia in her other; Hypertension in her other; Lung cancer in her father; Other in her other. Allergies  Allergen Reactions  . Losartan     headache  . Adhesive [Tape]     Blisters  . Ciprofloxacin     REACTION: tendinitis  . Clarithromycin     REACTION: severe reflux  . Prednisone     REACTION: irregular heartbeat, not able to sleep   Current Outpatient Prescriptions on File Prior to Visit  Medication Sig Dispense Refill  . amLODipine (NORVASC) 5 MG tablet TAKE 1 TABLET BY MOUTH EVERY DAY 90 tablet 3  . aspirin 81 MG EC tablet Take 81 mg by mouth daily.      Marland Kitchen azithromycin (ZITHROMAX) 500 MG tablet Take 1 tablet (500 mg total) by mouth 3 (three) times a week. 30 tablet 3  . Calcium Carbonate-Vitamin D 600-200 MG-UNIT CAPS Take 2 tablets  by mouth 2 (two) times daily.    . calcium citrate-vitamin D (CITRACAL+D) 315-200 MG-UNIT per tablet Take 2 tablets by mouth daily.     . carisoprodol (SOMA) 350 MG tablet TAKE 1 TABLET BY MOUTH EVERY DAY 30 tablet 0  . denosumab (PROLIA) 60 MG/ML SOLN injection Inject 60 mg into the skin every 6 (six) months. Administer in upper arm, thigh, or abdomen    . ethambutol (MYAMBUTOL) 400 MG tablet Take 3 tablets (1,200 mg total) by mouth 3 (three) times a week. Monday, Wednesday, Friday 90 tablet 3  . famotidine (PEPCID AC) 10 MG chewable tablet Chew 20 mg by mouth at bedtime and may repeat dose one  time if needed.    . irbesartan (AVAPRO) 300 MG tablet TAKE 1 TABLET BY MOUTH EVERY DAY 90 tablet 3  . letrozole (FEMARA) 2.5 MG tablet Take 2.5 mg by mouth daily.    . Methylcellulose, Laxative, (CITRUCEL PO) Take 2 tablets by mouth daily.     . pantoprazole (PROTONIX) 40 MG tablet Take 40 mg by mouth 2 (two) times daily.    . rifampin (RIFADIN) 300 MG capsule Take 2 capsules (600 mg total) by mouth 3 (three) times a week. Monday, Wednesday, Friday 90 capsule 3  . simvastatin (ZOCOR) 40 MG tablet 1/2 tablet daily 90 tablet 3  . temazepam (RESTORIL) 15 MG capsule TAKE 1-2 CAPSULES BY MOUTH EVERY NIGHT AT BEDTIME AS NEEDED FOR SLEEP 60 capsule 5   No current facility-administered medications on file prior to visit.    Review of Systems Constitutional: Negative for increased diaphoresis, other activity, appetite or other siginficant weight change  HENT: Negative for worsening hearing loss, ear pain, facial swelling, mouth sores and neck stiffness.   Eyes: Negative for other worsening pain, redness or visual disturbance.  Respiratory: Negative for shortness of breath and wheezing.   Cardiovascular: Negative for chest pain and palpitations.  Gastrointestinal: Negative for diarrhea, blood in stool, abdominal distention or other pain Genitourinary: Negative for hematuria, flank pain or change in urine volume.  Musculoskeletal: Negative for myalgias or other joint complaints.  Skin: Negative for color change and wound.  Neurological: Negative for syncope and numbness. other than noted Hematological: Negative for adenopathy. or other swelling Psychiatric/Behavioral: Negative for hallucinations, self-injury, decreased concentration or other worsening agitation.      Objective:   Physical Exam BP 120/86 mmHg  Pulse 104  Temp(Src) 98.4 F (36.9 C) (Oral)  Ht 5' 5.5" (1.664 m)  Wt 112 lb 12 oz (51.143 kg)  BMI 18.47 kg/m2  SpO2 97% VS noted,  Constitutional: Pt is oriented to person, place,  and time. Appears well-developed and well-nourished.  but thin Head: Normocephalic and atraumatic.  Right Ear: External ear normal.  Left Ear: External ear normal.  Nose: Nose normal.  Mouth/Throat: Oropharynx is clear and moist.  Eyes: Conjunctivae and EOM are normal. Pupils are equal, round, and reactive to light.  Neck: Normal range of motion. Neck supple. No JVD present. No tracheal deviation present.  Cardiovascular: Normal rate, regular rhythm, normal heart sounds and intact distal pulses.   Pulmonary/Chest: Effort normal and breath sounds without rales or wheezing  Abdominal: Soft. Bowel sounds are normal. NT. No HSM  Musculoskeletal: Normal range of motion. Exhibits no edema.  Lymphadenopathy:  Has no cervical adenopathy.  Neurological: Pt is alert and oriented to person, place, and time. Pt has normal reflexes. No cranial nerve deficit. Motor grossly intact Skin: Skin is warm and dry. No rash noted.  Psychiatric:  Has normal mood and affect. Behavior is normal.  No joint synovitis Spine with tender swelling mid left t6 paravertebral area only, but also tender to midline lowest lumbar without swelling, erythema, rash Wt Readings from Last 3 Encounters:  11/26/14 112 lb 12 oz (51.143 kg)  11/25/14 114 lb (51.71 kg)  08/20/14 113 lb (51.256 kg)       Assessment & Plan:

## 2014-11-26 NOTE — Patient Instructions (Signed)

## 2014-12-01 DIAGNOSIS — R0789 Other chest pain: Secondary | ICD-10-CM | POA: Diagnosis not present

## 2014-12-01 DIAGNOSIS — M62830 Muscle spasm of back: Secondary | ICD-10-CM | POA: Diagnosis not present

## 2014-12-21 ENCOUNTER — Encounter: Payer: Self-pay | Admitting: Internal Medicine

## 2014-12-21 MED ORDER — ATORVASTATIN CALCIUM 20 MG PO TABS: 20.0000 mg | ORAL_TABLET | Freq: Every day | ORAL | Status: DC

## 2015-01-18 ENCOUNTER — Ambulatory Visit (HOSPITAL_BASED_OUTPATIENT_CLINIC_OR_DEPARTMENT_OTHER)
Admission: RE | Admit: 2015-01-18 | Discharge: 2015-01-18 | Disposition: A | Discharge status: Home / Self Care | Payer: Medicare Other | Source: Ambulatory Visit | Attending: Infectious Diseases | Admitting: Infectious Diseases

## 2015-01-18 ENCOUNTER — Ambulatory Visit (INDEPENDENT_AMBULATORY_CARE_PROVIDER_SITE_OTHER): Payer: Medicare Other | Admitting: Infectious Diseases

## 2015-01-18 VITALS — BP 136/81 | HR 76 | Temp 98.6°F | Wt 113.0 lb

## 2015-01-18 DIAGNOSIS — A310 Pulmonary mycobacterial infection: Secondary | ICD-10-CM

## 2015-01-18 DIAGNOSIS — J181 Lobar pneumonia, unspecified organism: Secondary | ICD-10-CM | POA: Diagnosis not present

## 2015-01-18 DIAGNOSIS — J479 Bronchiectasis, uncomplicated: Secondary | ICD-10-CM | POA: Insufficient documentation

## 2015-01-18 NOTE — Assessment & Plan Note (Signed)
She appears to be doing well.  Will repeat her CT today.  Will stop her anti-MAI therapy.  Will see her back in 3-4 months.

## 2015-01-18 NOTE — Progress Notes (Signed)
   Subjective:    Patient ID: Diane Haney, female    DOB: 1946/06/19, 69 y.o.   MRN: [id number]  HPI 69 yo F with hx endoscopy in May 2005 which grew MAI (S- clarithro, eth, RIF 8.0, synergy positive for E/R) and fortuitum (S- cipro/smikacin/tigecycline).She began antibiotics May 2005 and was treated with ETH/Azithro and cipro until March 2007. Did well until July 2007 when she had a CT scan which showed some "activation" of MAI.  She was restarted on her MAI therapy in Nov 2008. CT scan 7-09 " 1. Stable mild lingular bronchiectasis. Stable tiny associated left lung nodules, which are consistent with a postinflammatory  etiology. 2. No active disease." Seen in ID November of 2009, was doing well and was taken off meds for MAI.  Seen November 2011 and felt like her "MAC is back". She had f/u CT 10-20-10: 1. New tree-in-bud opacities in the right middle lobe since the  prior CT from July, 2009, consistent with MAI. 2. Stable scarring, bronchiectasis, and tree-in-bud opacities in the inferior left upper lobe adjacent to the major fissure. Stable scar and bronchiectasis medially in the right middle lobe. No new pulmonary parenchymal abnormalities elsewhere.  3. Stable hyperinflation consistent with COPD and/or asthma. 4. No significant lymphadenopathy.  She was restarted on MAI rx- Azithro/ETH/Rif.  She has been dx with breast cancer (BRCA-, resected october 2013) and has received XRT/tamoxifen for 5 years. Now on letrizole.  As part of her f/u she underwent CT (06-19-13): Bilateral areas of bronchiectasis and peripheral tree in bud nodularity are identified. Findings are consistent with indolent atypical infection such as MAI. When compared with the previous exam there is been mild disease progression throughout the right lung.  She was restarted on her E/A/R in August of 2014.  She had repeat CT (01-2014): 1. Pulmonary parenchymal pattern of right middle lobe and lingular predominant  bronchiectasis, mild volume loss and nodularity is indicative of mycobacterium avium complex (MAC), without interval progression. 2. Presumed subpleural radiation fibrosis in the anterior right hemi-thorax. 3. Left anterior descending coronary artery calcification. She underwent repeat BAL on 05-07-14: Cx all (-), her cytology was (-) as well.  Today feels like she still has SOB. Occas non-productive cough.  She has GI distress. Worried that her EKG is abn (reviewed this with her).  Vision is good, had cataract surgery.  She is at 18 months of rx.  Review of Systems     Objective:   Physical Exam  Constitutional: She appears well-developed and well-nourished.  HENT:  Mouth/Throat: No oropharyngeal exudate.  Eyes: EOM are normal. Pupils are equal, round, and reactive to light.  Neck: Neck supple.  Cardiovascular: Normal rate, regular rhythm and normal heart sounds.   Pulmonary/Chest: Effort normal and breath sounds normal.  Abdominal: Soft. Bowel sounds are normal. She exhibits no distension. There is no tenderness.  Lymphadenopathy:    She has no cervical adenopathy.          Assessment & Plan:

## 2015-01-18 NOTE — Assessment & Plan Note (Signed)
Appreciate Dr Bettina Gavia f/u.

## 2015-01-25 ENCOUNTER — Encounter: Payer: Self-pay | Admitting: Infectious Diseases

## 2015-01-28 ENCOUNTER — Encounter: Payer: Self-pay | Admitting: *Deleted

## 2015-01-28 DIAGNOSIS — N889 Noninflammatory disorder of cervix uteri, unspecified: Secondary | ICD-10-CM | POA: Diagnosis not present

## 2015-01-28 DIAGNOSIS — Z1151 Encounter for screening for human papillomavirus (HPV): Secondary | ICD-10-CM | POA: Diagnosis not present

## 2015-01-28 DIAGNOSIS — Z124 Encounter for screening for malignant neoplasm of cervix: Secondary | ICD-10-CM | POA: Diagnosis not present

## 2015-01-28 DIAGNOSIS — Z01419 Encounter for gynecological examination (general) (routine) without abnormal findings: Secondary | ICD-10-CM | POA: Diagnosis not present

## 2015-01-28 DIAGNOSIS — G4700 Insomnia, unspecified: Secondary | ICD-10-CM | POA: Insufficient documentation

## 2015-02-03 DIAGNOSIS — Z681 Body mass index (BMI) 19 or less, adult: Secondary | ICD-10-CM | POA: Diagnosis not present

## 2015-02-03 DIAGNOSIS — C50519 Malignant neoplasm of lower-outer quadrant of unspecified female breast: Secondary | ICD-10-CM | POA: Diagnosis not present

## 2015-02-03 DIAGNOSIS — Z08 Encounter for follow-up examination after completed treatment for malignant neoplasm: Secondary | ICD-10-CM | POA: Diagnosis not present

## 2015-02-03 DIAGNOSIS — C50911 Malignant neoplasm of unspecified site of right female breast: Secondary | ICD-10-CM | POA: Diagnosis not present

## 2015-02-05 DIAGNOSIS — I10 Essential (primary) hypertension: Secondary | ICD-10-CM | POA: Diagnosis not present

## 2015-02-05 DIAGNOSIS — R0602 Shortness of breath: Secondary | ICD-10-CM | POA: Diagnosis not present

## 2015-02-19 DIAGNOSIS — Z79811 Long term (current) use of aromatase inhibitors: Secondary | ICD-10-CM | POA: Diagnosis not present

## 2015-02-19 DIAGNOSIS — E559 Vitamin D deficiency, unspecified: Secondary | ICD-10-CM | POA: Diagnosis not present

## 2015-02-19 DIAGNOSIS — Z170 Estrogen receptor positive status [ER+]: Secondary | ICD-10-CM | POA: Diagnosis not present

## 2015-02-19 DIAGNOSIS — C50919 Malignant neoplasm of unspecified site of unspecified female breast: Secondary | ICD-10-CM | POA: Diagnosis not present

## 2015-02-19 DIAGNOSIS — M810 Age-related osteoporosis without current pathological fracture: Secondary | ICD-10-CM | POA: Diagnosis not present

## 2015-02-22 DIAGNOSIS — R011 Cardiac murmur, unspecified: Secondary | ICD-10-CM | POA: Diagnosis not present

## 2015-03-01 DIAGNOSIS — R0602 Shortness of breath: Secondary | ICD-10-CM | POA: Diagnosis not present

## 2015-03-01 DIAGNOSIS — R9431 Abnormal electrocardiogram [ECG] [EKG]: Secondary | ICD-10-CM | POA: Diagnosis not present

## 2015-03-10 DIAGNOSIS — R002 Palpitations: Secondary | ICD-10-CM | POA: Diagnosis not present

## 2015-03-10 DIAGNOSIS — E780 Pure hypercholesterolemia: Secondary | ICD-10-CM | POA: Diagnosis not present

## 2015-03-10 DIAGNOSIS — I10 Essential (primary) hypertension: Secondary | ICD-10-CM | POA: Diagnosis not present

## 2015-03-11 ENCOUNTER — Ambulatory Visit (INDEPENDENT_AMBULATORY_CARE_PROVIDER_SITE_OTHER): Payer: Medicare Other | Admitting: Critical Care Medicine

## 2015-03-11 ENCOUNTER — Encounter: Payer: Self-pay | Admitting: Critical Care Medicine

## 2015-03-11 VITALS — BP 120/78 | HR 74 | Temp 98.6°F | Ht 65.5 in | Wt 112.2 lb

## 2015-03-11 DIAGNOSIS — J479 Bronchiectasis, uncomplicated: Secondary | ICD-10-CM

## 2015-03-11 NOTE — Assessment & Plan Note (Signed)
Stable bronchiectasis status post therapy per infectious disease for Mycobacterium avium Plan Maintain off antibiotic therapy for now Maintain off inhaled medications

## 2015-03-11 NOTE — Patient Instructions (Signed)
Return 4 months for recheck with Dr Elsworth Soho at [facility] No change in medications

## 2015-03-11 NOTE — Progress Notes (Signed)
Subjective:    Patient ID: Diane Haney, female    DOB: 03/11/1946, 69 y.o.   MRN: [id number]  HPI   03/11/2015 Chief Complaint  Patient presents with  . Follow-up    Still short of breath, done with antibiotics. no other concerns.  Now off all ABX.  And CT scan done  Dyspnea is stable. No mucus. No cough. No chest pain .  Notes some chills low grade Pt denies any significant sore throat, nasal congestion or excess secretions, fever, chills, sweats, unintended weight loss, pleurtic or exertional chest pain, orthopnea PND, or leg swelling Pt denies any increase in rescue therapy over baseline, denies waking up needing it or having any early am or nocturnal exacerbations of coughing/wheezing/or dyspnea. Pt also denies any obvious fluctuation in symptoms with  weather or environmental change or other alleviating or aggravating factors   Review of Systems Constitutional:   No  weight loss, no  night sweats,  no Fever, occ  Chill,  ++Fatigue no lassitude. HEENT:   No headaches,  Difficulty swallowing,  Tooth/dental problems,  Sore throat,                No sneezing, itching, ear ache, nasal congestion, occ  post nasal drip,   CV:  No chest pain,  Orthopnea, PND, swelling in lower extremities, anasarca, dizziness, palpitations  GI  No heartburn, indigestion, abdominal pain, nausea, vomiting, diarrhea, change in bowel habits, loss of appetite  Resp: Notes  shortness of breath with exertion not at rest.  No  excess mucus, no  productive cough,  Notes a  non-productive cough,  No coughing up of blood.  No change in color of mucus.  No wheezing.  No chest wall deformity  Skin: no rash or lesions.  GU: no dysuria, change in color of urine, no urgency or frequency.  No flank pain.  MS:  No joint pain or swelling.  No decreased range of motion.  No back pain.  Psych:  No change in mood or affect. No depression or anxiety.  No memory loss.     Objective:   Physical ExamBP 120/78 mmHg   Pulse 74  Temp(Src) 98.6 F (37 C) (Oral)  Ht 5' 5.5" (1.664 m)  Wt 112 lb 3.2 oz (50.894 kg)  BMI 18.38 kg/m2  SpO2 99%  Gen: Pleasant, well-nourished, in no distress,  normal affect  ENT: No lesions,  mouth clear,  oropharynx clear, no postnasal drip  Neck: No JVD, no TMG, no carotid bruits  Lungs: No use of accessory muscles, no dullness to percussion, distant BS, exp wheeze RUL and LUL areas but improved  Cardiovascular: RRR, heart sounds normal, no murmur or gallops, no peripheral edema  Abdomen: soft and NT, no HSM,  BS normal  Musculoskeletal: No deformities, no cyanosis or clubbing  Neuro: alert, non focal  Skin: Warm, no lesions or rashes   No results found.  Assessment & Plan:   BRONCHIECTASIS Stable bronchiectasis status post therapy per infectious disease for Mycobacterium avium Plan Maintain off antibiotic therapy for now Maintain off inhaled medications     Updated Medication List Outpatient Encounter Prescriptions as of 03/11/2015  Medication Sig  . amLODipine (NORVASC) 5 MG tablet TAKE 1 TABLET BY MOUTH EVERY DAY  . aspirin 81 MG EC tablet Take 81 mg by mouth daily.    Marland Kitchen atorvastatin (LIPITOR) 20 MG tablet Take 1 tablet (20 mg total) by mouth daily.  . calcium citrate-vitamin D (CITRACAL+D) 315-200 MG-UNIT per tablet  Take 2 tablets by mouth daily.   . carisoprodol (SOMA) 350 MG tablet 1 tab by mouth twice per day as needed  . denosumab (PROLIA) 60 MG/ML SOLN injection Inject 60 mg into the skin every 6 (six) months. Administer in upper arm, thigh, or abdomen  . irbesartan (AVAPRO) 300 MG tablet TAKE 1 TABLET BY MOUTH EVERY DAY  . letrozole (FEMARA) 2.5 MG tablet Take 2.5 mg by mouth daily.  . Methylcellulose, Laxative, (CITRUCEL PO) Take 2 tablets by mouth daily.   . pantoprazole (PROTONIX) 40 MG tablet Take 40 mg by mouth 2 (two) times daily.  . temazepam (RESTORIL) 15 MG capsule TAKE 1-2 CAPSULES BY MOUTH EVERY NIGHT AT BEDTIME AS NEEDED FOR SLEEP   . [DISCONTINUED] Calcium Carbonate-Vitamin D 600-200 MG-UNIT CAPS Take 2 tablets by mouth 2 (two) times daily.  . [DISCONTINUED] famotidine (PEPCID AC) 10 MG chewable tablet Chew 20 mg by mouth at bedtime and may repeat dose one time if needed.

## 2015-03-29 ENCOUNTER — Telehealth: Payer: Self-pay | Admitting: Internal Medicine

## 2015-03-30 MED ORDER — CARISOPRODOL 350 MG PO TABS: 350.0000 mg | ORAL_TABLET | Freq: Two times a day (BID) | ORAL | Status: DC | PRN

## 2015-03-30 NOTE — Telephone Encounter (Signed)
Done hardcopy to Cherina  

## 2015-03-31 ENCOUNTER — Other Ambulatory Visit: Payer: Self-pay | Admitting: Internal Medicine

## 2015-03-31 ENCOUNTER — Encounter: Payer: Self-pay | Admitting: Internal Medicine

## 2015-03-31 NOTE — Telephone Encounter (Signed)
Patient following up on rx fill. She states that [facility] has not received as of this morning. Please follow up

## 2015-03-31 NOTE — Telephone Encounter (Signed)
To cherina, can we make sure the soma rx makes it to the pharmacy? It was done yesterday to you

## 2015-04-01 ENCOUNTER — Encounter: Payer: Self-pay | Admitting: Internal Medicine

## 2015-04-01 MED ORDER — CARISOPRODOL 350 MG PO TABS: 350.0000 mg | ORAL_TABLET | Freq: Two times a day (BID) | ORAL | Status: DC | PRN

## 2015-04-01 NOTE — Telephone Encounter (Signed)
Up front for pt. To pick up

## 2015-04-01 NOTE — Telephone Encounter (Signed)
Done hardcopy to Cherina  

## 2015-04-01 NOTE — Telephone Encounter (Signed)
RX has been printed and is at the front desk for her to pickup.

## 2015-04-01 NOTE — Telephone Encounter (Signed)
For some reason, [facility] did not get the prescription that was printed out on 03/30/2015. Is it possible to have it reprinted? Patient wants to come back and pick it up today.

## 2015-04-14 ENCOUNTER — Ambulatory Visit: Payer: Medicare Other | Admitting: Infectious Diseases

## 2015-04-14 DIAGNOSIS — D485 Neoplasm of uncertain behavior of skin: Secondary | ICD-10-CM | POA: Diagnosis not present

## 2015-04-14 DIAGNOSIS — C4442 Squamous cell carcinoma of skin of scalp and neck: Secondary | ICD-10-CM | POA: Diagnosis not present

## 2015-04-20 DIAGNOSIS — C50419 Malignant neoplasm of upper-outer quadrant of unspecified female breast: Secondary | ICD-10-CM | POA: Diagnosis not present

## 2015-04-21 DIAGNOSIS — H16223 Keratoconjunctivitis sicca, not specified as Sjogren's, bilateral: Secondary | ICD-10-CM | POA: Diagnosis not present

## 2015-04-28 DIAGNOSIS — Z780 Asymptomatic menopausal state: Secondary | ICD-10-CM | POA: Diagnosis not present

## 2015-04-28 DIAGNOSIS — Z7983 Long term (current) use of bisphosphonates: Secondary | ICD-10-CM | POA: Diagnosis not present

## 2015-04-28 DIAGNOSIS — M810 Age-related osteoporosis without current pathological fracture: Secondary | ICD-10-CM | POA: Diagnosis not present

## 2015-04-29 DIAGNOSIS — C4442 Squamous cell carcinoma of skin of scalp and neck: Secondary | ICD-10-CM | POA: Diagnosis not present

## 2015-04-29 DIAGNOSIS — L570 Actinic keratosis: Secondary | ICD-10-CM | POA: Diagnosis not present

## 2015-05-12 ENCOUNTER — Ambulatory Visit (INDEPENDENT_AMBULATORY_CARE_PROVIDER_SITE_OTHER): Payer: Medicare Other | Admitting: Infectious Diseases

## 2015-05-12 VITALS — BP 103/65 | HR 72 | Temp 98.0°F | Ht 65.0 in | Wt 112.0 lb

## 2015-05-12 DIAGNOSIS — A310 Pulmonary mycobacterial infection: Secondary | ICD-10-CM

## 2015-05-12 NOTE — Progress Notes (Signed)
Subjective:    Patient ID: Diane Haney, female    DOB: 08-Jun-1946, 69 y.o.   MRN: [id number]  HPI 69 yo F with hx endoscopy in May 2005 which grew MAI (S- clarithro, eth, RIF 8.0, synergy positive for E/R) and fortuitum (S- cipro/smikacin/tigecycline).She began antibiotics May 2005 and was treated with ETH/Azithro and cipro until March 2007. Did well until July 2007 when she had a CT scan which showed some "activation" of MAI.  She was restarted on her MAI therapy in Nov 70. CT scan 7-09 " 1. Stable mild lingular bronchiectasis. Stable tiny associated left lung nodules, which are consistent with a postinflammatory  etiology. 2. No active disease." Seen in ID November of 70, was doing well and was taken off meds for MAI.  Seen November 2011 and felt like her "MAC is back". She had f/u CT 10-20-10: 1. New tree-in-bud opacities in the right middle lobe since the  prior CT from July, 2009, consistent with MAI. 2. Stable scarring, bronchiectasis, and tree-in-bud opacities in the inferior left upper lobe adjacent to the major fissure. Stable scar and bronchiectasis medially in the right middle lobe. No new pulmonary parenchymal abnormalities elsewhere.  3. Stable hyperinflation consistent with COPD and/or asthma. 4. No significant lymphadenopathy.  She was restarted on MAI rx- Azithro/ETH/Rif.  She has been dx with breast cancer (BRCA-, resected october 2013) and has received XRT/tamoxifen for 5 years. Now on letrizole.  As part of her f/u she underwent CT (06-19-13): Bilateral areas of bronchiectasis and peripheral tree in bud nodularity are identified. Findings are consistent with indolent atypical infection such as MAI. When compared with the previous exam there is been mild disease progression throughout the right lung.  She was restarted on her E/A/R in August of 2014.  She had repeat CT (01-2014): 1. Pulmonary parenchymal pattern of right middle lobe and lingular predominant  bronchiectasis, mild volume loss and nodularity is indicative of mycobacterium avium complex (MAC), without interval progression. 2. Presumed subpleural radiation fibrosis in the anterior right hemi-thorax. 3. Left anterior descending coronary artery calcification. She underwent repeat BAL on 05-07-14: Cx all (-), her cytology was (-) as well. She was seen in ID f/u 01-18-15 and was therapy was stopped (she was at 18 months of therapy).   She had f/u CT of chest showing: 1. Interval slight progression in right apical consolidation with air bronchograms, probably progression of radiation fibrosis. The left apical component is unchanged. 2. Otherwise stable chronic lung disease with bronchiectasis, scarring and peribronchial nodularity consistent with chronic atypical mycobacterial infection. 3. No evidence of metastatic disease or acute process.  Feels "pretty good", has less stamina, maybe coughing a little more (non-productive). SOB more easily. Having chills (has had for several months, attributes to changes in ambient temp).  Feels like since off anbx her stomach has improved.   Review of Systems  Constitutional: Positive for chills. Negative for fever.  Respiratory: Positive for cough and shortness of breath.       Objective:   Physical Exam  Constitutional: She appears well-developed and well-nourished.  HENT:  Mouth/Throat: No oropharyngeal exudate.  Eyes: EOM are normal. Pupils are equal, round, and reactive to light.  Neck: Neck supple.  Cardiovascular: Normal rate, regular rhythm and normal heart sounds.   Pulmonary/Chest: Effort normal and breath sounds normal.  Abdominal: Soft. Bowel sounds are normal. There is no tenderness. There is no rebound.  Lymphadenopathy:    She has no cervical adenopathy.  Assessment & Plan:

## 2015-05-12 NOTE — Assessment & Plan Note (Signed)
She appears to be doing well.  She is roughly at (what I assume is) her baseline. Her CT scan is stable We discussed her antibiotics, will hold on giving her any further rx at this point.   Will see her back in 1 year.

## 2015-05-19 DIAGNOSIS — C50919 Malignant neoplasm of unspecified site of unspecified female breast: Secondary | ICD-10-CM | POA: Diagnosis not present

## 2015-05-19 DIAGNOSIS — Z170 Estrogen receptor positive status [ER+]: Secondary | ICD-10-CM | POA: Diagnosis not present

## 2015-05-29 ENCOUNTER — Other Ambulatory Visit: Payer: Self-pay | Admitting: Internal Medicine

## 2015-06-01 NOTE — Telephone Encounter (Signed)
Done hardcopy to Dahlia  

## 2015-06-01 NOTE — Telephone Encounter (Signed)
Rx faxed to pharmacy  

## 2015-06-06 DIAGNOSIS — J209 Acute bronchitis, unspecified: Secondary | ICD-10-CM | POA: Diagnosis not present

## 2015-06-07 DIAGNOSIS — C50919 Malignant neoplasm of unspecified site of unspecified female breast: Secondary | ICD-10-CM | POA: Diagnosis not present

## 2015-06-07 DIAGNOSIS — Z170 Estrogen receptor positive status [ER+]: Secondary | ICD-10-CM | POA: Diagnosis not present

## 2015-06-21 ENCOUNTER — Telehealth: Payer: Self-pay | Admitting: Pulmonary Disease

## 2015-06-21 ENCOUNTER — Ambulatory Visit (INDEPENDENT_AMBULATORY_CARE_PROVIDER_SITE_OTHER): Payer: Medicare Other | Admitting: Pulmonary Disease

## 2015-06-21 ENCOUNTER — Encounter: Payer: Self-pay | Admitting: Pulmonary Disease

## 2015-06-21 VITALS — BP 138/88 | HR 77 | Temp 98.3°F | Ht 65.5 in | Wt 113.0 lb

## 2015-06-21 DIAGNOSIS — J479 Bronchiectasis, uncomplicated: Secondary | ICD-10-CM

## 2015-06-21 DIAGNOSIS — J209 Acute bronchitis, unspecified: Secondary | ICD-10-CM

## 2015-06-21 DIAGNOSIS — H16223 Keratoconjunctivitis sicca, not specified as Sjogren's, bilateral: Secondary | ICD-10-CM | POA: Diagnosis not present

## 2015-06-21 NOTE — Telephone Encounter (Signed)
Patient was originally scheduled for appt with MW tomorrow 06/22/15 Pt requests a sooner appt. Rescheduled to today with VS at 2:30.  Nothing further needed.

## 2015-06-21 NOTE — Progress Notes (Signed)
Chief Complaint  Patient presents with  . Follow-up    PW pt: Pt states that cough started x 3 weeks ago. Seen in UC and ewas dx with Bronchitis- given ZPAK x 5 days. Pt states that she went to the beach and symptoms worsened since return 06/19/15. Worsening SOB and unable to get mucus up. Denies fever, and chest tightness/pain.     History of Present Illness: Diane Haney is a 69 y.o. female with bronchiectasis and MAI.  She is followed by Dr. Joya Gaskins.  She will be switching to Dr. Elsworth Soho in August.  She has chronic cough, but this got worse a few weeks ago.  She was having more sputum, wheeze, and chest congestion.  She had temp up to 100F.  She was seen in urgent care and given script for zithromax.  She is feeling better, and no further fever.  She still has cough and trouble bringing up sputum.  She was given albuterol, but has not used this yet.  She was given script for phenergan, and this has helped with her cough at night.  She has sore throat from cough.  She denies sinus congestion, ear pain, gland swelling, or abdominal symptoms.   She has been off MAI therapy since February, but was previously seen by Dr. Johnnye Sima with ID.  Her CT chest from February 2016 was reviewed by me.   Past medical hx, Past surgical hx, Medications, Allergies, Family hx, Social hx all reviewed.   Physical Exam: BP 138/88 mmHg  Pulse 77  Temp(Src) 98.3 F (36.8 C) (Oral)  Ht 5' 5.5" (1.664 m)  Wt 113 lb (51.256 kg)  BMI 18.51 kg/m2  SpO2 96%  General - No distress, raspy voice ENT - No sinus tenderness, no oral exudate, no LAN, TM clear Cardiac - s1s2 regular, no murmur Chest - No wheeze/rales/dullness Back - No focal tenderness Abd - Soft, non-tender Ext - No edema Neuro - Normal strength Skin - No rashes Psych - normal mood, and behavior   Assessment/Plan:  Acute bronchitis. Seems to be improving.  Exam benign. Plan: - defer additional Abx and chest xray for now - she can use OTC  mucinex and/or delsym - she can try prn albuterol - she can try salt water gargles, sip water when she feels urge to cough, and sugarless candy to keep mouth moist - advised her to call back if her symptoms get worse  Bronchiectasis with hx of MAI. Plan: - she will f/u with Dr. Elsworth Soho in August 2016   Chesley Mires, MD [facility] Pager:  [phone]

## 2015-06-21 NOTE — Patient Instructions (Signed)
Can try mucinex for cough with chest congestion, or delsym for dry cough >> can get both of these without prescription Can try albuterol 2 puffs every 4 to 6 hours as needed for cough, chest congestion or wheezing Try sipping water when you have urge to cough Use salt water gargle as needed Try using sugarless candy to keep mouth moist  Follow up with Dr. Elsworth Soho in 4 to 6 weeks

## 2015-06-22 ENCOUNTER — Ambulatory Visit: Payer: BLUE CROSS/BLUE SHIELD | Admitting: Internal Medicine

## 2015-07-06 DIAGNOSIS — L570 Actinic keratosis: Secondary | ICD-10-CM | POA: Diagnosis not present

## 2015-07-06 DIAGNOSIS — Z85828 Personal history of other malignant neoplasm of skin: Secondary | ICD-10-CM | POA: Diagnosis not present

## 2015-07-06 DIAGNOSIS — L821 Other seborrheic keratosis: Secondary | ICD-10-CM | POA: Diagnosis not present

## 2015-07-06 DIAGNOSIS — Z08 Encounter for follow-up examination after completed treatment for malignant neoplasm: Secondary | ICD-10-CM | POA: Diagnosis not present

## 2015-07-06 DIAGNOSIS — L578 Other skin changes due to chronic exposure to nonionizing radiation: Secondary | ICD-10-CM | POA: Diagnosis not present

## 2015-07-08 ENCOUNTER — Ambulatory Visit: Payer: BLUE CROSS/BLUE SHIELD | Admitting: Pulmonary Disease

## 2015-07-29 ENCOUNTER — Ambulatory Visit (INDEPENDENT_AMBULATORY_CARE_PROVIDER_SITE_OTHER): Payer: Medicare Other | Admitting: Adult Health

## 2015-07-29 ENCOUNTER — Encounter: Payer: Self-pay | Admitting: Adult Health

## 2015-07-29 VITALS — BP 105/68 | HR 82 | Ht 65.5 in | Wt 111.0 lb

## 2015-07-29 DIAGNOSIS — J479 Bronchiectasis, uncomplicated: Secondary | ICD-10-CM

## 2015-07-29 DIAGNOSIS — J209 Acute bronchitis, unspecified: Secondary | ICD-10-CM | POA: Insufficient documentation

## 2015-07-29 MED ORDER — FLUTTER DEVI: Status: AC

## 2015-07-29 NOTE — Progress Notes (Signed)
   Subjective:    Patient ID: Diane Haney, female    DOB: July 02, 1946, 69 y.o.   MRN: [id number]  HPI 69 yo female with bronchiectasis and MAI   07/29/2015 Follow up Bronchiectasis and MAI  Presents for follow up .  Followed by Dr. Johnnye Sima with ID , for recurrent MAI with previous tx of MAI triple therapy rx.  Finished last course (39mon) in Feb 2016 .   Had recent bronchitis 6 weeks ago, tx w/ abx. Feels she is over this  Still has daily cough but this is chronic for her.  No fever , chest pain, orthopnea, discolored mucus Does not have flutter valve.   Denies chest pain, orthopnea, edema or fever.     Review of Systems  Constitutional:   No  weight loss, night sweats,  Fevers, chills, fatigue, or  lassitude.  HEENT:   No headaches,  Difficulty swallowing,  Tooth/dental problems, or  Sore throat,                No sneezing, itching, ear ache,  +nasal congestion, post nasal drip,   CV:  No chest pain,  Orthopnea, PND, swelling in lower extremities, anasarca, dizziness, palpitations, syncope.   GI  No heartburn, indigestion, abdominal pain, nausea, vomiting, diarrhea, change in bowel habits, loss of appetite, bloody stools.   Resp:    No chest wall deformity  Skin: no rash or lesions.  GU: no dysuria, change in color of urine, no urgency or frequency.  No flank pain, no hematuria   MS:  No joint pain or swelling.  No decreased range of motion.  No back pain.  Psych:  No change in mood or affect. No depression or anxiety.  No memory loss.          Objective:   Physical Exam  GEN: A/Ox3; pleasant , NAD, well nourished   HEENT:  Gray/AT,  EACs-clear, TMs-wnl, NOSE-clear, THROAT-clear, no lesions, no postnasal drip or exudate noted.   NECK:  Supple w/ fair ROM; no JVD; normal carotid impulses w/o bruits; no thyromegaly or nodules palpated; no lymphadenopathy.  RESP  Clear  P & A; w/o, wheezes/ rales/ or rhonchi.no accessory muscle use, no dullness to  percussion  CARD:  RRR, no m/r/g  , no peripheral edema, pulses intact, no cyanosis or clubbing.  GI:   Soft & nt; nml bowel sounds; no organomegaly or masses detected.  Musco: Warm bil, no deformities or joint swelling noted.   Neuro: alert, no focal deficits noted.    Skin: Warm, no lesions or rashes        Assessment & Plan:

## 2015-07-29 NOTE — Patient Instructions (Signed)
Continue on current regimen  Get flu shot next month if available .  Follow up Dr. Elsworth Soho  In 3 months and As needed

## 2015-07-29 NOTE — Assessment & Plan Note (Signed)
Compensated without flare  Add flutter valve   Plan  Continue on current regimen  Add flutter valve  Get flu shot next month if available .  Follow up Dr. Elsworth Soho  In 3 months and As needed

## 2015-07-29 NOTE — Assessment & Plan Note (Signed)
Recent flare now resolved  Cont on current regimen   

## 2015-08-02 NOTE — Progress Notes (Signed)
Reviewed & agree with plan  

## 2015-08-05 DIAGNOSIS — Z170 Estrogen receptor positive status [ER+]: Secondary | ICD-10-CM | POA: Diagnosis not present

## 2015-08-05 DIAGNOSIS — Z681 Body mass index (BMI) 19 or less, adult: Secondary | ICD-10-CM | POA: Diagnosis not present

## 2015-08-05 DIAGNOSIS — C50919 Malignant neoplasm of unspecified site of unspecified female breast: Secondary | ICD-10-CM | POA: Diagnosis not present

## 2015-08-19 DIAGNOSIS — Z23 Encounter for immunization: Secondary | ICD-10-CM | POA: Diagnosis not present

## 2015-10-20 DIAGNOSIS — H43813 Vitreous degeneration, bilateral: Secondary | ICD-10-CM | POA: Diagnosis not present

## 2015-10-20 DIAGNOSIS — Z961 Presence of intraocular lens: Secondary | ICD-10-CM | POA: Diagnosis not present

## 2015-10-20 DIAGNOSIS — H524 Presbyopia: Secondary | ICD-10-CM | POA: Diagnosis not present

## 2015-10-20 DIAGNOSIS — H16223 Keratoconjunctivitis sicca, not specified as Sjogren's, bilateral: Secondary | ICD-10-CM | POA: Diagnosis not present

## 2015-10-28 ENCOUNTER — Encounter: Payer: Self-pay | Admitting: Adult Health

## 2015-10-28 ENCOUNTER — Ambulatory Visit (INDEPENDENT_AMBULATORY_CARE_PROVIDER_SITE_OTHER): Payer: Medicare Other | Admitting: Adult Health

## 2015-10-28 VITALS — BP 113/56 | HR 66 | Temp 98.0°F | Ht 65.5 in | Wt 112.0 lb

## 2015-10-28 DIAGNOSIS — J479 Bronchiectasis, uncomplicated: Secondary | ICD-10-CM | POA: Diagnosis not present

## 2015-10-28 DIAGNOSIS — R05 Cough: Secondary | ICD-10-CM | POA: Diagnosis not present

## 2015-10-28 DIAGNOSIS — R053 Chronic cough: Secondary | ICD-10-CM

## 2015-10-28 NOTE — Progress Notes (Signed)
   Subjective:    Patient ID: Diane Haney, female    DOB: [birth year], 69 y.o.   MRN: [id number]  HPI 69 yo female with bronchiectasis and MAI   10/28/2015 Follow up Bronchiectasis and MAI  Presents for 4 month follow up .  Does have a dry cough most days .   Started on flutter valve last ov , but does perceive any benefit.  Has tried spiriva and symbicort but did not like them because of side effect of palpitation./heart racing.  Denies chest pain, discolored mucus, fever or orthopnea.    Followed by Dr. Johnnye Sima with ID , for recurrent MAI with previous tx of MAI triple therapy rx.  Finished last course (43mon) in Feb 2016 .  No fever , chest pain, orthopnea, discolored mucus Prevnar, Pnuemovax , TDAP and flu vaccine utd.      Review of Systems  Constitutional:   No  weight loss, night sweats,  Fevers, chills, fatigue, or  lassitude.  HEENT:   No headaches,  Difficulty swallowing,  Tooth/dental problems, or  Sore throat,                No sneezing, itching, ear ache,  +nasal congestion, post nasal drip,   CV:  No chest pain,  Orthopnea, PND, swelling in lower extremities, anasarca, dizziness, palpitations, syncope.   GI  No heartburn, indigestion, abdominal pain, nausea, vomiting, diarrhea, change in bowel habits, loss of appetite, bloody stools.   Resp:    No chest wall deformity  Skin: no rash or lesions.  GU: no dysuria, change in color of urine, no urgency or frequency.  No flank pain, no hematuria   MS:  No joint pain or swelling.  No decreased range of motion.  No back pain.  Psych:  No change in mood or affect. No depression or anxiety.  No memory loss.          Objective:   Physical Exam  GEN: A/Ox3; pleasant , NAD, well nourished   VS reviewed   HEENT:  [place]/AT,  EACs-clear, TMs-wnl, NOSE-clear, THROAT-clear, no lesions, no postnasal drip or exudate noted.   NECK:  Supple w/ fair ROM; no JVD; normal carotid impulses w/o bruits; no thyromegaly or  nodules palpated; no lymphadenopathy.  RESP  Clear  P & A; w/o, wheezes/ rales/ or rhonchi.no accessory muscle use, no dullness to percussion  CARD:  RRR, no m/r/g  , no peripheral edema, pulses intact, no cyanosis or clubbing.  GI:   Soft & nt; nml bowel sounds; no organomegaly or masses detected.  Musco: Warm bil, no deformities or joint swelling noted.   Neuro: alert, no focal deficits noted.    Skin: Warm, no lesions or rashes   CT 01/2015>Interval slight progression in right apical consolidation with air bronchograms, probably progression of radiation fibrosis. The left apical component is unchanged. 2. Otherwise stable chronic lung disease with bronchiectasis, scarring and peribronchial nodularity consistent with chronic atypical mycobacterial infection. 3. No evidence of metastatic disease or acute process.       Assessment & Plan:

## 2015-10-28 NOTE — Assessment & Plan Note (Signed)
At baseline   Plan  Continue on current regimen  Continue with flutter valve .  Follow up Dr. Elsworth Soho  As planned  and As needed

## 2015-10-28 NOTE — Patient Instructions (Signed)
Continue on current regimen  Continue with flutter valve .  Follow up Dr. Elsworth Soho  As planned  and As needed

## 2015-10-28 NOTE — Assessment & Plan Note (Signed)
Compensated without flare  Vaccine utd   Plan  Continue on current regimen  Continue with flutter valve .  Follow up Dr. Elsworth Soho  As planned  and As needed

## 2015-10-29 NOTE — Progress Notes (Signed)
Reviewed & agree with plan  

## 2015-11-02 ENCOUNTER — Other Ambulatory Visit: Payer: Self-pay | Admitting: Internal Medicine

## 2015-11-18 ENCOUNTER — Ambulatory Visit: Payer: BLUE CROSS/BLUE SHIELD | Admitting: Pulmonary Disease

## 2015-11-24 DIAGNOSIS — Z681 Body mass index (BMI) 19 or less, adult: Secondary | ICD-10-CM | POA: Diagnosis not present

## 2015-11-24 DIAGNOSIS — Z08 Encounter for follow-up examination after completed treatment for malignant neoplasm: Secondary | ICD-10-CM | POA: Diagnosis not present

## 2015-11-24 DIAGNOSIS — Z5181 Encounter for therapeutic drug level monitoring: Secondary | ICD-10-CM | POA: Insufficient documentation

## 2015-11-24 DIAGNOSIS — Z170 Estrogen receptor positive status [ER+]: Secondary | ICD-10-CM | POA: Diagnosis not present

## 2015-11-24 DIAGNOSIS — Z79899 Other long term (current) drug therapy: Secondary | ICD-10-CM | POA: Diagnosis not present

## 2015-11-24 DIAGNOSIS — C50411 Malignant neoplasm of upper-outer quadrant of right female breast: Secondary | ICD-10-CM | POA: Diagnosis not present

## 2015-11-24 DIAGNOSIS — Z853 Personal history of malignant neoplasm of breast: Secondary | ICD-10-CM | POA: Diagnosis not present

## 2015-11-24 DIAGNOSIS — Z79811 Long term (current) use of aromatase inhibitors: Secondary | ICD-10-CM | POA: Diagnosis not present

## 2015-11-24 DIAGNOSIS — M797 Fibromyalgia: Secondary | ICD-10-CM | POA: Diagnosis not present

## 2015-11-24 DIAGNOSIS — C50919 Malignant neoplasm of unspecified site of unspecified female breast: Secondary | ICD-10-CM | POA: Diagnosis not present

## 2015-11-24 DIAGNOSIS — C50911 Malignant neoplasm of unspecified site of right female breast: Secondary | ICD-10-CM | POA: Diagnosis not present

## 2015-11-24 DIAGNOSIS — M810 Age-related osteoporosis without current pathological fracture: Secondary | ICD-10-CM | POA: Diagnosis not present

## 2015-11-29 DIAGNOSIS — K219 Gastro-esophageal reflux disease without esophagitis: Secondary | ICD-10-CM | POA: Diagnosis not present

## 2015-11-30 ENCOUNTER — Telehealth: Payer: Self-pay

## 2015-11-30 NOTE — Telephone Encounter (Signed)
Call to Ms. Mariconda and spouse answered and stated that the patient was planning to stay post Dr. Gwynn Burly apt for AWV visit tomorrow.

## 2015-12-01 ENCOUNTER — Other Ambulatory Visit (INDEPENDENT_AMBULATORY_CARE_PROVIDER_SITE_OTHER): Payer: Medicare Other

## 2015-12-01 ENCOUNTER — Ambulatory Visit (INDEPENDENT_AMBULATORY_CARE_PROVIDER_SITE_OTHER): Payer: Medicare Other | Admitting: Internal Medicine

## 2015-12-01 ENCOUNTER — Encounter: Payer: Self-pay | Admitting: Internal Medicine

## 2015-12-01 VITALS — BP 120/76 | HR 71 | Temp 97.7°F | Ht 66.0 in | Wt 115.0 lb

## 2015-12-01 DIAGNOSIS — E785 Hyperlipidemia, unspecified: Secondary | ICD-10-CM | POA: Diagnosis not present

## 2015-12-01 DIAGNOSIS — Z20828 Contact with and (suspected) exposure to other viral communicable diseases: Secondary | ICD-10-CM

## 2015-12-01 DIAGNOSIS — J449 Chronic obstructive pulmonary disease, unspecified: Secondary | ICD-10-CM

## 2015-12-01 DIAGNOSIS — I10 Essential (primary) hypertension: Secondary | ICD-10-CM | POA: Diagnosis not present

## 2015-12-01 DIAGNOSIS — Z0000 Encounter for general adult medical examination without abnormal findings: Secondary | ICD-10-CM

## 2015-12-01 DIAGNOSIS — R7302 Impaired glucose tolerance (oral): Secondary | ICD-10-CM | POA: Diagnosis not present

## 2015-12-01 LAB — BASIC METABOLIC PANEL
BUN: 17 mg/dL (ref 6–23)
CALCIUM: 10.1 mg/dL (ref 8.4–10.5)
CO2: 29 mEq/L (ref 19–32)
CREATININE: 0.84 mg/dL (ref 0.40–1.20)
Chloride: 104 mEq/L (ref 96–112)
GFR: 71.21 mL/min (ref >60.00)
GLUCOSE: 104 mg/dL — AB (ref 70–99)
POTASSIUM: 4.5 meq/L (ref 3.5–5.1)
Sodium: 142 mEq/L (ref 135–145)

## 2015-12-01 LAB — HEPATIC FUNCTION PANEL
ALBUMIN: 4.4 g/dL (ref 3.5–5.2)
ALK PHOS: 39 U/L (ref 39–117)
ALT: 42 U/L — ABNORMAL HIGH (ref 0–35)
AST: 39 U/L — ABNORMAL HIGH (ref 0–37)
Bilirubin, Direct: 0.1 mg/dL (ref 0.0–0.3)
TOTAL PROTEIN: 7.8 g/dL (ref 6.0–8.3)
Total Bilirubin: 0.4 mg/dL (ref 0.2–1.2)

## 2015-12-01 LAB — CBC WITH DIFFERENTIAL/PLATELET
BASOS ABS: 0 10*3/uL (ref 0.0–0.1)
Basophils Relative: 0.4 % (ref 0.0–3.0)
EOS PCT: 0.9 % (ref 0.0–5.0)
Eosinophils Absolute: 0.1 10*3/uL (ref 0.0–0.7)
HEMATOCRIT: 42.8 % (ref 36.0–46.0)
HEMOGLOBIN: 14.1 g/dL (ref 12.0–15.0)
LYMPHS PCT: 13.8 % (ref 12.0–46.0)
Lymphs Abs: 1 10*3/uL (ref 0.7–4.0)
MCHC: 33 g/dL (ref 30.0–36.0)
MCV: 96.6 fl (ref 78.0–100.0)
MONOS PCT: 11.4 % (ref 3.0–12.0)
Monocytes Absolute: 0.8 10*3/uL (ref 0.1–1.0)
NEUTROS PCT: 73.5 % (ref 43.0–77.0)
Neutro Abs: 5.5 10*3/uL (ref 1.4–7.7)
Platelets: 208 10*3/uL (ref 150.0–400.0)
RBC: 4.44 Mil/uL (ref 3.87–5.11)
RDW: 14.1 % (ref 11.5–15.5)
WBC: 7.4 10*3/uL (ref 4.0–10.5)

## 2015-12-01 LAB — LIPID PANEL
CHOL/HDL RATIO: 2
Cholesterol: 179 mg/dL (ref 0–200)
HDL: 82.5 mg/dL (ref >39.00)
LDL CALC: 85 mg/dL (ref 0–99)
NonHDL: 96.12
Triglycerides: 55 mg/dL (ref 0.0–149.0)
VLDL: 11 mg/dL (ref 0.0–40.0)

## 2015-12-01 LAB — URINALYSIS, ROUTINE W REFLEX MICROSCOPIC
BILIRUBIN URINE: NEGATIVE
Ketones, ur: NEGATIVE
LEUKOCYTES UA: NEGATIVE
NITRITE: NEGATIVE
Specific Gravity, Urine: 1.015 (ref 1.000–1.030)
TOTAL PROTEIN, URINE-UPE24: NEGATIVE
UROBILINOGEN UA: 0.2 (ref 0.0–1.0)
Urine Glucose: NEGATIVE
pH: 7.5 (ref 5.0–8.0)

## 2015-12-01 LAB — HEMOGLOBIN A1C: HEMOGLOBIN A1C: 5.9 % (ref 4.6–6.5)

## 2015-12-01 LAB — TSH: TSH: 4.06 u[IU]/mL (ref 0.35–4.50)

## 2015-12-01 MED ORDER — AMLODIPINE BESYLATE 5 MG PO TABS: 5.0000 mg | ORAL_TABLET | Freq: Every day | ORAL | Status: DC

## 2015-12-01 MED ORDER — IRBESARTAN 300 MG PO TABS: 300.0000 mg | ORAL_TABLET | Freq: Every day | ORAL | Status: DC

## 2015-12-01 MED ORDER — ATORVASTATIN CALCIUM 20 MG PO TABS: 20.0000 mg | ORAL_TABLET | Freq: Every day | ORAL | Status: DC

## 2015-12-01 MED ORDER — TEMAZEPAM 15 MG PO CAPS: ORAL_CAPSULE | ORAL | Status: DC

## 2015-12-01 MED ORDER — CARISOPRODOL 350 MG PO TABS: 350.0000 mg | ORAL_TABLET | Freq: Two times a day (BID) | ORAL | Status: DC | PRN

## 2015-12-01 NOTE — Assessment & Plan Note (Addendum)
stable overall by history and exam, recent data reviewed with pt, and pt to continue medical treatment as before,  to f/u any worsening symptoms or concerns BP Readings from Last 3 Encounters:  12/01/15 120/76  10/28/15 113/56  07/29/15 105/68   ECG reviewed as per emr

## 2015-12-01 NOTE — Progress Notes (Signed)
Pre visit review using our clinic review tool, if applicable. No additional management support is needed unless otherwise documented below in the visit note. 

## 2015-12-01 NOTE — Patient Instructions (Addendum)
Your EKG was OK today  Please continue all other medications as before, and refills have been done if requested - the soma and temazepam  Please have the pharmacy call with any other refills you may need.  Please continue your efforts at being more active, low cholesterol diet, and weight control.  You are otherwise up to date with prevention measures today.  Please keep your appointments with your specialists as you may have planned  Please go to the LAB in the Basement (turn left off the elevator) for the tests to be done today  You will be contacted by phone if any changes need to be made immediately.  Otherwise, you will receive a letter about your results with an explanation, but please check with MyChart first.  Please remember to sign up for MyChart if you have not done so, as this will be important to you in the future with finding out test results, communicating by private email, and scheduling acute appointments online when needed.  Please return in 1 year for your yearly visit, or sooner if needed   Ms. Delaine , Thank you for taking time to come for your Medicare Wellness Visit. I appreciate your ongoing commitment to your health goals. Please review the following plan we discussed and let me know if I can assist you in the future.   Goal is to maintain;   [facility] / hearing aid information  Deaf [facility] Services  No reviews  [facility]  [place] [id number]  [phone]  Can come in and try out all of the equipment  May have sliding scale assistance if meets financial guidelines for assistance  These are the goals we discussed: Goals    None      This is a list of the screening recommended for you and due dates:  Health Maintenance  Topic Date Due  .  Hepatitis C: One time screening is recommended by Center for Disease Control  (CDC) for  adults born from [birth year] through 1966.   1946-04-30  . Mammogram  12/10/2013  . Flu Shot   07/11/2016  . Colon Cancer Screening  10/05/2024  . Tetanus Vaccine  09/07/2025  . DEXA scan (bone density measurement)  Completed  . Shingles Vaccine  Addressed  . Pneumonia vaccines  Completed

## 2015-12-01 NOTE — Progress Notes (Signed)
Subjective:    Patient ID: Diane Haney, female    DOB: 07-02-1946, 69 y.o.   MRN: [id number]  HPI  Here for yearly f/u;  Overall doing ok;  Pt denies Chest pain, worsening SOB, DOE, wheezing, orthopnea, PND, worsening LE edema, palpitations, dizziness or syncope.  Pt denies neurological change such as new headache, facial or extremity weakness.  Pt denies polydipsia, polyuria, or low sugar symptoms. Pt states overall good compliance with treatment and medications, good tolerability, and has been trying to follow appropriate diet.  Pt denies worsening depressive symptoms, suicidal ideation or panic. No fever, night sweats, wt loss, loss of appetite, or other constitutional symptoms.  Pt states good ability with ADL's, has low fall risk, home safety reviewed and adequate, no other significant changes in hearing or vision, and only occasionally active with exercise. Has ongoing msk pain - asks for soma refill.  Has persistent insomnia, needs med refill Past Medical History  Diagnosis Date  . COPD (chronic obstructive pulmonary disease) ([place])   . Hyperlipidemia   . Hypertension   . Pulmonary Mycobacterium avium complex (MAC) infection ([place])   . Bronchiectasis   . History of shingles   . GERD (gastroesophageal reflux disease)   . Fibromyalgia   . Migraine   . Osteopenia   . Squamous cell skin cancer, multiple sites   . DVT, lower extremity ([facility])     LLE  . Breast cancer ([place]) 11/12/2012    Dx oct 2013 - [place] - Invasive ductal carcinoma, s/o bilat mastectomy with + margin  - also for XRT soon  . Chronic fatigue fibromyalgia syndrome 11/12/2012  . Impaired glucose tolerance 11/14/2013  . Osteoporosis 10/16/2007    Qualifier: Diagnosis of  By: Jenny Reichmann MD, [place] Oris    Past Surgical History  Procedure Laterality Date  . Shoulder impingement    . Foot surgery    . Mastectomy bilateral oct 2013    . Video bronchoscopy N/A 05/07/2014    Procedure: VIDEO BRONCHOSCOPY WITHOUT  FLUORO;  Surgeon: Elsie Stain, MD;  Location: [facility];  Service: Cardiopulmonary;  Laterality: N/A;    reports that she quit smoking about 30 years ago. Her smoking use included Cigarettes. She has a 25 pack-year smoking history. She has never used smokeless tobacco. She reports that she does not drink alcohol or use illicit drugs. family history includes Breast cancer in her other; Colon polyps in her father; Heart disease in her other; Hyperlipidemia in her other; Hypertension in her other; Lung cancer in her father; Other in her other. Allergies  Allergen Reactions  . Losartan     headache  . Adhesive [Tape]     Blisters  . Ciprofloxacin     REACTION: tendinitis  . Clarithromycin     REACTION: severe reflux  . Prednisone     REACTION: irregular heartbeat, not able to sleep   Current Outpatient Prescriptions on File Prior to Visit  Medication Sig Dispense Refill  . aspirin 81 MG EC tablet Take 81 mg by mouth daily.      . calcium citrate-vitamin D (CITRACAL+D) 315-200 MG-UNIT per tablet Take 2 tablets by mouth daily.     Marland Kitchen denosumab (PROLIA) 60 MG/ML SOLN injection Inject 60 mg into the skin every 6 (six) months. Administer in upper arm, thigh, or abdomen    . gabapentin (NEURONTIN) 100 MG capsule Take 100 mg by mouth 3 (three) times daily.    Marland Kitchen letrozole (FEMARA) 2.5 MG tablet  Take 2.5 mg by mouth daily.    . Methylcellulose, Laxative, (CITRUCEL PO) Take 2 tablets by mouth daily.     Marland Kitchen Respiratory Therapy Supplies (FLUTTER) DEVI Use as directed. 1 each 0  . RESTASIS 0.05 % ophthalmic emulsion   3  . pantoprazole (PROTONIX) 40 MG tablet Take 40 mg by mouth 2 (two) times daily. Reported on 12/01/2015     No current facility-administered medications on file prior to visit.   Had echo last yr with cardiology and stress test 2016 [facility].   Review of Systems Constitutional: Negative for increased diaphoresis, other activity, appetite or siginficant weight change other  than noted HENT: Negative for worsening hearing loss, ear pain, facial swelling, mouth sores and neck stiffness.   Eyes: Negative for other worsening pain, redness or visual disturbance.  Respiratory: Negative for shortness of breath and wheezing  Cardiovascular: Negative for chest pain and palpitations.  Gastrointestinal: Negative for diarrhea, blood in stool, abdominal distention or other pain Genitourinary: Negative for hematuria, flank pain or change in urine volume.  Musculoskeletal: Negative for myalgias or other joint complaints.  Skin: Negative for color change and wound or drainage.  Neurological: Negative for syncope and numbness. other than noted Hematological: Negative for adenopathy. or other swelling Psychiatric/Behavioral: Negative for hallucinations, SI, self-injury, decreased concentration or other worsening agitation.      Objective:   Physical Exam BP 120/76 mmHg  Pulse 71  Temp(Src) 97.7 F (36.5 C) (Oral)  Ht 5\' 6"  (1.676 m)  Wt 115 lb (52.164 kg)  BMI 18.57 kg/m2  SpO2 99% VS noted,  Constitutional: Pt is oriented to person, place, and time. Appears well-developed and well-nourished, in no significant distress Head: Normocephalic and atraumatic.  Right Ear: External ear normal.  Left Ear: External ear normal.  Nose: Nose normal.  Mouth/Throat: Oropharynx is clear and moist.  Eyes: Conjunctivae and EOM are normal. Pupils are equal, round, and reactive to light.  Neck: Normal range of motion. Neck supple. No JVD present. No tracheal deviation present or significant neck LA or mass Cardiovascular: Normal rate, regular rhythm, normal heart sounds and intact distal pulses.   Pulmonary/Chest: Effort normal and breath sounds without rales or wheezing  Abdominal: Soft. Bowel sounds are normal. NT. No HSM  Musculoskeletal: Normal range of motion. Exhibits no edema.  Lymphadenopathy:  Has no cervical adenopathy.  Neurological: Pt is alert and oriented to person,  place, and time. Pt has normal reflexes. No cranial nerve deficit. Motor grossly intact Skin: Skin is warm and dry. No rash noted.  Psychiatric:  Has normal mood and affect. Behavior is normal.     Assessment & Plan:

## 2015-12-01 NOTE — Progress Notes (Signed)
Subjective:   Diane Haney is a 69 y.o. female who presents for Medicare Annual (Subsequent) preventive examination.  Review of Systems:     HRA assessment completed during visit;   ROS deferred to CPE exam with physician today  Describes health has fair; but states she does have CFS and Fibro x 30 to 40 years. Does not take anything for either but has learned to live with it.  Has 3 dtr and 5 grand dtr; one lives in [place].  Moved from [facility] x 7 years ago;   Medical issues  MAC with multiple reoccurrence's; First in 2005 and tx with 3 antibiotics x 2 years; Has had 3 more exacerbations since and just finished an 18 month course last Feb; Does have some hearing loss secondary   BMI: 18.6 Diet; States spouse has MI very early in life she always prepares a low fat; low cho diet;  Exercise; Does exercise every day whether she feels like it or not;  M-T-W-F she stretches 10 minutes and then rides stationary bike 20 to 25 minutes;  Also does Arm weights x 3 to 4 days; Started out doing 1 minute a day an built up over time;   SAFETY; one level with a bonus room; paths are clear; has tub and shower; been in home x 5 years; moved up here 69 yo from [place];  [place] as needed; bathroom safety; community safety; smoke detectors and firearms safety reviewed ;  Driving accidents no and seatbelt yes Sun protection/ yes  Medication review/ no issues buying meds  Fall assessment; no  Mobilization and Functional losses in the last year; no Sleep patterns; take medicine    Urinary or fecal incontinence reviewed; no  Goal; Patient;  To remain happy, stable and moving   Counseling: Colonoscopy; 10/05/2014; repeat screening in 10 years EKG: 12/01/2015 Hearing: Does have hearing loss from antibiotic treating MAC;  Cannot afford hearing aid;  Resources  Resource for adaptive hearing equipment; [facility]  No reviews  [employer] Office  [address]  [address]  [phone]  Can come in and try out all of the equipment  May have sliding scale assistance if meets financial guidelines for assistance  Dexa 04/14/2013 -2.0 spine; -2.6 left femoral neck/ Prolia injections every 6 months; Last one this month and has been 2.5 years Mammagram 12/11/2011/ [facility] 09/2014  Ophthalmology exam; last month;  Immunizations Due: none due;   Current Care Team reviewed and updated Dr. Elsworth Soho; Davonna Belling - Pulmonary  Oncologist; Dr. Harlow Asa Radiologist [place]  Dr. Susa Griffins; GI       Objective:     Vitals: BP 120/76 mmHg  Pulse 71  Temp(Src) 97.7 F (36.5 C) (Oral)  Ht 5\' 6"  (1.676 m)  Wt 115 lb (52.164 kg)  BMI 18.57 kg/m2  SpO2 99%  Tobacco History  Smoking status  . Former Smoker -- 1.00 packs/day for 25 years  . Types: Cigarettes  . Quit date: 12/11/1985  Smokeless tobacco  . Never Used    Comment: pt does not smoke     Counseling given: Not Answered   Past Medical History  Diagnosis Date  . COPD (chronic obstructive pulmonary disease) ([place])   . Hyperlipidemia   . Hypertension   . Pulmonary Mycobacterium avium complex (MAC) infection ([place])   . Bronchiectasis   . History of shingles   . GERD (gastroesophageal reflux disease)   . Fibromyalgia   . Migraine   .  Osteopenia   . Squamous cell skin cancer, multiple sites   . DVT, lower extremity (HCC)     LLE  . Breast cancer ([place]) 11/12/2012    Dx oct 2013 - [place] - Invasive ductal carcinoma, s/o bilat mastectomy with + margin  - also for XRT soon  . Chronic fatigue fibromyalgia syndrome 11/12/2012  . Impaired glucose tolerance 11/14/2013  . Osteoporosis 10/16/2007    Qualifier: Diagnosis of  By: Jenny Reichmann MD, Hunt Oris    Past Surgical History  Procedure Laterality Date  . Shoulder impingement    . Foot surgery    . Mastectomy bilateral oct 2013    . Video bronchoscopy N/A 05/07/2014    Procedure: VIDEO BRONCHOSCOPY WITHOUT FLUORO;  Surgeon: Elsie Stain, MD;   Location: [facility];  Service: Cardiopulmonary;  Laterality: N/A;   Family History  Problem Relation Age of Onset  . Lung cancer Father   . Heart disease Other     grandparents  . Breast cancer Other   . Hyperlipidemia Other   . Hypertension Other   . Other Other     alcholism / addiction  . Colon polyps Father    History  Sexual Activity  . Sexual Activity: Not on file    Outpatient Encounter Prescriptions as of 12/01/2015  Medication Sig  . amLODipine (NORVASC) 5 MG tablet Take 1 tablet (5 mg total) by mouth daily.  Marland Kitchen aspirin 81 MG EC tablet Take 81 mg by mouth daily.    Marland Kitchen atorvastatin (LIPITOR) 20 MG tablet Take 1 tablet (20 mg total) by mouth daily.  . calcium citrate-vitamin D (CITRACAL+D) 315-200 MG-UNIT per tablet Take 2 tablets by mouth daily.   . carisoprodol (SOMA) 350 MG tablet Take 1 tablet (350 mg total) by mouth 2 (two) times daily as needed.  Marland Kitchen denosumab (PROLIA) 60 MG/ML SOLN injection Inject 60 mg into the skin every 6 (six) months. Administer in upper arm, thigh, or abdomen  . gabapentin (NEURONTIN) 100 MG capsule Take 100 mg by mouth 3 (three) times daily.  . irbesartan (AVAPRO) 300 MG tablet Take 1 tablet (300 mg total) by mouth daily.  Marland Kitchen letrozole (FEMARA) 2.5 MG tablet Take 2.5 mg by mouth daily.  . Methylcellulose, Laxative, (CITRUCEL PO) Take 2 tablets by mouth daily.   . ranitidine (ZANTAC) 150 MG tablet   . Respiratory Therapy Supplies (FLUTTER) DEVI Use as directed.  . RESTASIS 0.05 % ophthalmic emulsion   . temazepam (RESTORIL) 15 MG capsule TAKE 1 OR 2 CAPSULES BY MOUTH EVERY NIGHT AT BEDTIME AS NEEDED FOR SLEEP  . [DISCONTINUED] amLODipine (NORVASC) 5 MG tablet TAKE 1 TABLET BY MOUTH EVERY DAY  . [DISCONTINUED] atorvastatin (LIPITOR) 20 MG tablet Take 1 tablet (20 mg total) by mouth daily.  . [DISCONTINUED] carisoprodol (SOMA) 350 MG tablet Take 1 tablet (350 mg total) by mouth 2 (two) times daily as needed.  . [DISCONTINUED] irbesartan (AVAPRO)  300 MG tablet TAKE 1 TABLET BY MOUTH EVERY DAY  . [DISCONTINUED] temazepam (RESTORIL) 15 MG capsule TAKE 1 OR 2 CAPSULES BY MOUTH EVERY NIGHT AT BEDTIME AS NEEDED FOR SLEEP  . pantoprazole (PROTONIX) 40 MG tablet Take 40 mg by mouth 2 (two) times daily. Reported on 12/01/2015   No facility-administered encounter medications on file as of 12/01/2015.    Activities of Daily Living No flowsheet data found.  Patient Care Team: Biagio Borg, MD as PCP - General (Internal Medicine) Elsie Stain, MD (Pulmonary Disease)  Assessment:    Assessment   Patient presents for yearly preventative medicine examination. Medicare questionnaire screening were completed, i.e. Functional; fall risk; depression, memory loss and hearing were all negative;   All immunizations and health maintenance protocols were reviewed and up to date; taking prolia currently for bone loss  Education provided for laboratory screens;  To have labs drawn today  Medication reconciliation, past medical history, social history, problem list and allergies were reviewed in detail with the patient  Goals were established with regard to maintaining her health;  End of life planning was discussed and has completed this    Exercise Activities and Dietary recommendations  Goal is to remain as mobile as possible and continue her exercises   Goals    None     Fall Risk Fall Risk  12/01/2015 01/18/2015 11/26/2014 01/12/2014 11/14/2013  Falls in the past year? No No No No No   Depression Screen PHQ 2/9 Scores 12/01/2015 01/18/2015 11/26/2014 01/12/2014  PHQ - 2 Score 0 0 0 0     Cognitive Testing No flowsheet data found.  Affect pleasant and appropriate; Continues to feel hopeful; Strong coping skills;   Immunization History  Administered Date(s) Administered  . Influenza,inj,Quad PF,36+ Mos 08/25/2013, 07/29/2014  . Influenza-Unspecified 09/08/2015  . Pneumococcal Conjugate-13 11/14/2013  . Pneumococcal  Polysaccharide-23 10/11/2006, 11/12/2012  . Td 12/12/2003, 09/08/2015  . Tdap 09/08/2015   Screening Tests Health Maintenance  Topic Date Due  . Hepatitis C Screening  [birth year]  . MAMMOGRAM  12/10/2013  . INFLUENZA VACCINE  07/11/2016  . COLONOSCOPY  10/05/2024  . TETANUS/TDAP  09/07/2025  . DEXA SCAN  Completed  . ZOSTAVAX  Addressed  . PNA vac Low Risk Adult  Completed      Plan:   Shared resources for [facility]; [address]  [phone] For assistance with first hearing aid  During the course of the visit the patient was educated and counseled about the following appropriate screening and preventive services:   Vaccines to include Pneumoccal, Influenza, Hepatitis B, Td, Zostavax, HCV/ up to date  Electrocardiogram/ completed  Cardiovascular Disease/ deferred to CPE  Colorectal cancer screening/ 2015; will consider in 10years if not aged out  Bone density screening; 04/14/2013  Diabetes screening/ deferred  Glaucoma screening/ last month and no glaucoma or other issues  Mammography/PAP/ 09/2014;   Nutrition counseling; eats healthy low fat diet  Patient Instructions (the written plan) was given to the patient.   Wynetta Fines, RN  12/01/2015

## 2015-12-02 LAB — HEPATITIS C ANTIBODY: HCV Ab: NEGATIVE

## 2015-12-04 NOTE — Assessment & Plan Note (Signed)
stable overall by history and exam, recent data reviewed with pt, and pt to continue medical treatment as before,  to f/u any worsening symptoms or concerns Lab Results  Component Value Date   LDLCALC 85 12/01/2015    

## 2015-12-04 NOTE — Assessment & Plan Note (Signed)
stable overall by history and exam, recent data reviewed with pt, and pt to continue medical treatment as before,  to f/u any worsening symptoms or concerns SpO2 Readings from Last 3 Encounters:  12/01/15 99%  10/28/15 100%  07/29/15 100%

## 2015-12-04 NOTE — Assessment & Plan Note (Signed)
stable overall by history and exam, recent data reviewed with pt, and pt to continue medical treatment as before,  to f/u any worsening symptoms or concerns Lab Results  Component Value Date   HGBA1C 5.9 12/01/2015    

## 2016-01-11 DIAGNOSIS — C44311 Basal cell carcinoma of skin of nose: Secondary | ICD-10-CM | POA: Diagnosis not present

## 2016-01-11 DIAGNOSIS — L570 Actinic keratosis: Secondary | ICD-10-CM | POA: Diagnosis not present

## 2016-01-11 DIAGNOSIS — Z08 Encounter for follow-up examination after completed treatment for malignant neoplasm: Secondary | ICD-10-CM | POA: Diagnosis not present

## 2016-01-11 DIAGNOSIS — L821 Other seborrheic keratosis: Secondary | ICD-10-CM | POA: Diagnosis not present

## 2016-01-11 DIAGNOSIS — L578 Other skin changes due to chronic exposure to nonionizing radiation: Secondary | ICD-10-CM | POA: Diagnosis not present

## 2016-01-11 DIAGNOSIS — D485 Neoplasm of uncertain behavior of skin: Secondary | ICD-10-CM | POA: Diagnosis not present

## 2016-01-11 DIAGNOSIS — Z85828 Personal history of other malignant neoplasm of skin: Secondary | ICD-10-CM | POA: Diagnosis not present

## 2016-01-20 ENCOUNTER — Ambulatory Visit: Payer: BLUE CROSS/BLUE SHIELD | Admitting: Internal Medicine

## 2016-01-21 ENCOUNTER — Encounter: Payer: Self-pay | Admitting: Internal Medicine

## 2016-01-21 ENCOUNTER — Ambulatory Visit (INDEPENDENT_AMBULATORY_CARE_PROVIDER_SITE_OTHER): Payer: Medicare Other | Admitting: Internal Medicine

## 2016-01-21 VITALS — BP 142/70 | HR 113 | Temp 98.4°F | Resp 20 | Wt 111.0 lb

## 2016-01-21 DIAGNOSIS — R609 Edema, unspecified: Secondary | ICD-10-CM | POA: Diagnosis not present

## 2016-01-21 DIAGNOSIS — I10 Essential (primary) hypertension: Secondary | ICD-10-CM

## 2016-01-21 DIAGNOSIS — J449 Chronic obstructive pulmonary disease, unspecified: Secondary | ICD-10-CM

## 2016-01-21 MED ORDER — OLMESARTAN MEDOXOMIL-HCTZ 40-12.5 MG PO TABS: 1.0000 | ORAL_TABLET | Freq: Every day | ORAL | Status: DC

## 2016-01-21 NOTE — Progress Notes (Signed)
Subjective:    Patient ID: Diane Haney, female    DOB: January 23, 1946, 70 y.o.   MRN: [id number]  HPI  Here to f/u, has been experiencing elev BP at home in the last few wks just not improving, assoc with intermittent HA, but  Pt denies fever, wt gain, night sweats, loss of appetite, or other constitutional symptoms  Pt denies chest pain, increased sob or doe, wheezing, orthopnea, PND, increased LE swelling, palpitations, dizziness or syncope.  Pt denies new neurological symptoms such as new facial or extremity weakness or numbness   Pt denies polydipsia, polyuria, Overall good compliance with treatment, and good medicine tolerability. BP Readings from Last 3 Encounters:  01/21/16 142/70  12/01/15 120/76  10/28/15 113/56   Wt Readings from Last 3 Encounters:  01/21/16 111 lb (50.349 kg)  12/01/15 115 lb (52.164 kg)  10/28/15 112 lb (50.803 kg)   Past Medical History  Diagnosis Date  . COPD (chronic obstructive pulmonary disease) ([place])   . Hyperlipidemia   . Hypertension   . Pulmonary Mycobacterium avium complex (MAC) infection ([place])   . Bronchiectasis   . History of shingles   . GERD (gastroesophageal reflux disease)   . Fibromyalgia   . Migraine   . Osteopenia   . Squamous cell skin cancer, multiple sites   . DVT, lower extremity (HCC)     LLE  . Breast cancer ([place]) 11/12/2012    Dx oct 2013 - [place] - Invasive ductal carcinoma, s/o bilat mastectomy with + margin  - also for XRT soon  . Chronic fatigue fibromyalgia syndrome 11/12/2012  . Impaired glucose tolerance 11/14/2013  . Osteoporosis 10/16/2007    Qualifier: Diagnosis of  By: Jenny Reichmann MD, Hunt Oris    Past Surgical History  Procedure Laterality Date  . Shoulder impingement    . Foot surgery    . Mastectomy bilateral oct 2013    . Video bronchoscopy N/A 05/07/2014    Procedure: VIDEO BRONCHOSCOPY WITHOUT FLUORO;  Surgeon: Elsie Stain, MD;  Location: [facility];  Service: Cardiopulmonary;   Laterality: N/A;    reports that she quit smoking about 30 years ago. Her smoking use included Cigarettes. She has a 25 pack-year smoking history. She has never used smokeless tobacco. She reports that she does not drink alcohol or use illicit drugs. family history includes Breast cancer in her other; Colon polyps in her father; Heart disease in her other; Hyperlipidemia in her other; Hypertension in her other; Lung cancer in her father; Other in her other. Allergies  Allergen Reactions  . Losartan     headache  . Adhesive [Tape]     Blisters  . Ciprofloxacin     REACTION: tendinitis  . Clarithromycin     REACTION: severe reflux  . Prednisone     REACTION: irregular heartbeat, not able to sleep   Current Outpatient Prescriptions on File Prior to Visit  Medication Sig Dispense Refill  . amLODipine (NORVASC) 5 MG tablet Take 1 tablet (5 mg total) by mouth daily. 90 tablet 3  . aspirin 81 MG EC tablet Take 81 mg by mouth daily.      Marland Kitchen atorvastatin (LIPITOR) 20 MG tablet Take 1 tablet (20 mg total) by mouth daily. 90 tablet 3  . calcium citrate-vitamin D (CITRACAL+D) 315-200 MG-UNIT per tablet Take 2 tablets by mouth daily.     . carisoprodol (SOMA) 350 MG tablet Take 1 tablet (350 mg total) by mouth 2 (two) times daily as  needed. 60 tablet 2  . denosumab (PROLIA) 60 MG/ML SOLN injection Inject 60 mg into the skin every 6 (six) months. Administer in upper arm, thigh, or abdomen    . gabapentin (NEURONTIN) 100 MG capsule Take 100 mg by mouth 3 (three) times daily.    Marland Kitchen letrozole (FEMARA) 2.5 MG tablet Take 2.5 mg by mouth daily.    . Methylcellulose, Laxative, (CITRUCEL PO) Take 2 tablets by mouth daily.     . ranitidine (ZANTAC) 150 MG tablet     . Respiratory Therapy Supplies (FLUTTER) DEVI Use as directed. 1 each 0  . RESTASIS 0.05 % ophthalmic emulsion   3  . temazepam (RESTORIL) 15 MG capsule TAKE 1 OR 2 CAPSULES BY MOUTH EVERY NIGHT AT BEDTIME AS NEEDED FOR SLEEP 60 capsule 5   No  current facility-administered medications on file prior to visit.   Review of Systems  Constitutional: Negative for unusual diaphoresis or night sweats HENT: Negative for ringing in ear or discharge Eyes: Negative for double vision or worsening visual disturbance.  Respiratory: Negative for choking and stridor.   Gastrointestinal: Negative for vomiting or other signifcant bowel change Genitourinary: Negative for hematuria or change in urine volume.  Musculoskeletal: Negative for other MSK pain or swelling Skin: Negative for color change and worsening wound.  Neurological: Negative for tremors and numbness other than noted  Psychiatric/Behavioral: Negative for decreased concentration or agitation other than above        Objective:   Physical Exam BP 142/70 mmHg  Pulse 113  Temp(Src) 98.4 F (36.9 C) (Oral)  Resp 20  Wt 111 lb (50.349 kg)  SpO2 98% VS noted,  Constitutional: Pt appears in no significant distress HENT: Head: NCAT.  Right Ear: External ear normal.  Left Ear: External ear normal.  Eyes: . Pupils are equal, round, and reactive to light. Conjunctivae and EOM are normal Neck: Normal range of motion. Neck supple.  Cardiovascular: Normal rate and regular rhythm.   Pulmonary/Chest: Effort normal and breath sounds decresaed without worsening rales or wheezing.  Neurological: Pt is alert. Not confused , motor grossly intact Skin: Skin is warm. No rash, no LE edema Psychiatric: Pt behavior is normal. No agitation.     Assessment & Plan:

## 2016-01-21 NOTE — Patient Instructions (Addendum)
OK to stop the irbesartan (avapro)  Please take all new medication as prescribed - the benicar HCT  Please continue all other medications as before, including the norvasc at 5 mg  Please have the pharmacy call with any other refills you may need.  Please continue your efforts at being more active, low cholesterol diet, and weight control.  Please keep your appointments with your specialists as you may have planned  Please return in 3 months, or sooner if needed

## 2016-01-21 NOTE — Progress Notes (Signed)
Pre visit review using our clinic review tool, if applicable. No additional management support is needed unless otherwise documented below in the visit note. 

## 2016-01-22 NOTE — Assessment & Plan Note (Signed)
Mild uncontrolled, etiology unclear ok to d/c irbesartan 300 to the hopefully more effective benicar HCT 40/12.5 qd, to cont to monitor at home and next visit, for low salt, regular exercise, wt control BP Readings from Last 3 Encounters:  01/21/16 142/70  12/01/15 120/76  10/28/15 113/56

## 2016-01-22 NOTE — Assessment & Plan Note (Signed)
None today, may have been in part related to higher dose amlod in past, would avoid increased amlod for BP control if possible

## 2016-01-22 NOTE — Assessment & Plan Note (Signed)
stable overall by history and exam, recent data reviewed with pt, and pt to continue medical treatment as before,  to f/u any worsening symptoms or concerns SpO2 Readings from Last 3 Encounters:  01/21/16 98%  12/01/15 99%  10/28/15 100%

## 2016-01-27 ENCOUNTER — Encounter: Payer: Self-pay | Admitting: Pulmonary Disease

## 2016-01-27 ENCOUNTER — Ambulatory Visit (HOSPITAL_BASED_OUTPATIENT_CLINIC_OR_DEPARTMENT_OTHER)
Admission: RE | Admit: 2016-01-27 | Discharge: 2016-01-27 | Disposition: A | Discharge status: Home / Self Care | Payer: Medicare Other | Source: Ambulatory Visit | Attending: Pulmonary Disease | Admitting: Pulmonary Disease

## 2016-01-27 ENCOUNTER — Ambulatory Visit (INDEPENDENT_AMBULATORY_CARE_PROVIDER_SITE_OTHER): Payer: Medicare Other | Admitting: Pulmonary Disease

## 2016-01-27 VITALS — BP 114/78 | HR 81 | Ht 65.5 in | Wt 111.0 lb

## 2016-01-27 DIAGNOSIS — R05 Cough: Secondary | ICD-10-CM | POA: Diagnosis not present

## 2016-01-27 DIAGNOSIS — J449 Chronic obstructive pulmonary disease, unspecified: Secondary | ICD-10-CM | POA: Insufficient documentation

## 2016-01-27 DIAGNOSIS — J479 Bronchiectasis, uncomplicated: Secondary | ICD-10-CM | POA: Diagnosis not present

## 2016-01-27 NOTE — Assessment & Plan Note (Signed)
no meds required

## 2016-01-27 NOTE — Assessment & Plan Note (Signed)
CXR today Call us for signs of bronchitis

## 2016-01-27 NOTE — Progress Notes (Signed)
   Subjective:    Patient ID: Diane Haney, female    DOB: 1946-03-26, 70 y.o.   MRN: [id number]  HPI  70 yo female with bronchiectasis and MAI   Was seen  by Dr. Johnnye Sima with ID , for recurrent MAI with previous tx of MAI triple therapy rx.  Finished last course (62mon) in Feb 2016 .  01/27/2016  Chief Complaint  Patient presents with  . Follow-up    usual shortness of breath, discuss when to get next CT scan.    12m FU And one episode of bronchitis in the last year Keeps a dry cough most days .   Started on flutter valve last ov , but does perceive any benefit.  Has tried spiriva and symbicort but did not like them because of side effect of palpitation./heart racing.  Denies chest pain, discolored mucus, fever or orthopnea.     Immunizations utd.   Significant tests/ events 2011 spirometry normal CT 01/2015>Interval slight progression in right apical consolidation with air bronchograms, probably progression of radiation fibrosis. The left apical component is unchanged. - stable chronic lung disease with bronchiectasis,scarring and peribronchial nodularity consistent   Review of Systems  No fever , chest pain, orthopnea, discolored mucus  neg for any significant sore throat, dysphagia, itching, sneezing, nasal congestion or excess/ purulent secretions, fever, chills, sweats, unintended wt loss, pleuritic or exertional cp, hempoptysis, orthopnea pnd or change in chronic leg swelling.   Also denies presyncope, palpitations, heartburn, abdominal pain, nausea, vomiting, diarrhea or change in bowel or urinary habits, dysuria,hematuria, rash, arthralgias, visual complaints, headache, numbness weakness or ataxia.     Objective:   Physical Exam  Gen. Pleasant, thin woman, in no distress ENT - no lesions, no post nasal drip Neck: No JVD, no thyromegaly, no carotid bruits Lungs: no use of accessory muscles, no dullness to percussion, clear without rales or rhonchi  Cardiovascular:  Rhythm regular, heart sounds  normal, no murmurs or gallops, no peripheral edema Musculoskeletal: No deformities, no cyanosis or clubbing        Assessment & Plan:

## 2016-01-27 NOTE — Patient Instructions (Addendum)
CXR today Call us for signs of bronchitis

## 2016-02-01 ENCOUNTER — Encounter: Payer: Self-pay | Admitting: Adult Health

## 2016-02-18 DIAGNOSIS — H16223 Keratoconjunctivitis sicca, not specified as Sjogren's, bilateral: Secondary | ICD-10-CM | POA: Diagnosis not present

## 2016-02-23 DIAGNOSIS — C50919 Malignant neoplasm of unspecified site of unspecified female breast: Secondary | ICD-10-CM | POA: Diagnosis not present

## 2016-02-23 DIAGNOSIS — Z681 Body mass index (BMI) 19 or less, adult: Secondary | ICD-10-CM | POA: Diagnosis not present

## 2016-02-23 DIAGNOSIS — C50411 Malignant neoplasm of upper-outer quadrant of right female breast: Secondary | ICD-10-CM | POA: Diagnosis not present

## 2016-02-23 DIAGNOSIS — Z08 Encounter for follow-up examination after completed treatment for malignant neoplasm: Secondary | ICD-10-CM | POA: Diagnosis not present

## 2016-02-23 DIAGNOSIS — Z170 Estrogen receptor positive status [ER+]: Secondary | ICD-10-CM | POA: Diagnosis not present

## 2016-02-23 DIAGNOSIS — Z853 Personal history of malignant neoplasm of breast: Secondary | ICD-10-CM | POA: Diagnosis not present

## 2016-02-23 DIAGNOSIS — Z79899 Other long term (current) drug therapy: Secondary | ICD-10-CM | POA: Diagnosis not present

## 2016-02-23 DIAGNOSIS — R0789 Other chest pain: Secondary | ICD-10-CM | POA: Diagnosis not present

## 2016-02-23 DIAGNOSIS — I10 Essential (primary) hypertension: Secondary | ICD-10-CM | POA: Diagnosis not present

## 2016-02-23 DIAGNOSIS — Z9011 Acquired absence of right breast and nipple: Secondary | ICD-10-CM | POA: Diagnosis not present

## 2016-02-29 ENCOUNTER — Encounter: Payer: Self-pay | Admitting: Internal Medicine

## 2016-02-29 ENCOUNTER — Ambulatory Visit (INDEPENDENT_AMBULATORY_CARE_PROVIDER_SITE_OTHER): Payer: Medicare Other | Admitting: Internal Medicine

## 2016-02-29 VITALS — BP 100/60 | HR 97 | Temp 98.2°F | Resp 20 | Wt 112.0 lb

## 2016-02-29 DIAGNOSIS — I10 Essential (primary) hypertension: Secondary | ICD-10-CM | POA: Diagnosis not present

## 2016-02-29 DIAGNOSIS — J449 Chronic obstructive pulmonary disease, unspecified: Secondary | ICD-10-CM

## 2016-02-29 DIAGNOSIS — R7302 Impaired glucose tolerance (oral): Secondary | ICD-10-CM | POA: Diagnosis not present

## 2016-02-29 MED ORDER — OLMESARTAN MEDOXOMIL-HCTZ 20-12.5 MG PO TABS: 1.0000 | ORAL_TABLET | Freq: Every day | ORAL | Status: DC

## 2016-02-29 NOTE — Progress Notes (Signed)
Pre visit review using our clinic review tool, if applicable. No additional management support is needed unless otherwise documented below in the visit note. 

## 2016-02-29 NOTE — Progress Notes (Signed)
Subjective:    Patient ID: Diane Haney, female    DOB: 1946/01/30, 70 y.o.   MRN: [id number]  HPI  Here to f/u, reason does not seen obvious but pt c/o recurrent dizziness, lightheadedness on current BP meds, BP has been lower recently, seems worse in AM after taking meds, better later in the day. Pt denies chest pain, increased sob or doe, wheezing, orthopnea, PND, increased LE swelling, palpitations, or syncope.Pt denies new neurological symptoms such as new headache, or facial or extremity weakness or numbness   Pt denies polydipsia, polyuria,  Pt denies fever, wt loss, night sweats, loss of appetite, or other constitutional symptoms  BP Readings from Last 3 Encounters:  02/29/16 100/60  01/27/16 114/78  01/21/16 142/70   Wt Readings from Last 3 Encounters:  02/29/16 112 lb (50.803 kg)  01/27/16 111 lb (50.349 kg)  01/21/16 111 lb (50.349 kg)   Past Medical History  Diagnosis Date  . COPD (chronic obstructive pulmonary disease) ([place])   . Hyperlipidemia   . Hypertension   . Pulmonary Mycobacterium avium complex (MAC) infection ([place])   . Bronchiectasis   . History of shingles   . GERD (gastroesophageal reflux disease)   . Fibromyalgia   . Migraine   . Osteopenia   . Squamous cell skin cancer, multiple sites   . DVT, lower extremity ([facility])     LLE  . Breast cancer ([place]) 11/12/2012    Dx oct 2013 - [place] - Invasive ductal carcinoma, s/o bilat mastectomy with + margin  - also for XRT soon  . Chronic fatigue fibromyalgia syndrome 11/12/2012  . Impaired glucose tolerance 11/14/2013  . Osteoporosis 10/16/2007    Qualifier: Diagnosis of  By: Jenny Reichmann MD, Hunt Oris    Past Surgical History  Procedure Laterality Date  . Shoulder impingement    . Foot surgery    . Mastectomy bilateral oct 2013    . Video bronchoscopy N/A 05/07/2014    Procedure: VIDEO BRONCHOSCOPY WITHOUT FLUORO;  Surgeon: Elsie Stain, MD;  Location: [facility];  Service: Cardiopulmonary;   Laterality: N/A;    reports that she quit smoking about 30 years ago. Her smoking use included Cigarettes. She has a 25 pack-year smoking history. She has never used smokeless tobacco. She reports that she does not drink alcohol or use illicit drugs. family history includes Breast cancer in her other; Colon polyps in her father; Heart disease in her other; Hyperlipidemia in her other; Hypertension in her other; Lung cancer in her father; Other in her other. Allergies  Allergen Reactions  . Losartan     headache  . Adhesive [Tape]     Blisters  . Ciprofloxacin     REACTION: tendinitis  . Clarithromycin     REACTION: severe reflux  . Prednisone     REACTION: irregular heartbeat, not able to sleep   Current Outpatient Prescriptions on File Prior to Visit  Medication Sig Dispense Refill  . amLODipine (NORVASC) 5 MG tablet Take 1 tablet (5 mg total) by mouth daily. 90 tablet 3  . aspirin 81 MG EC tablet Take 81 mg by mouth daily.      Marland Kitchen atorvastatin (LIPITOR) 20 MG tablet Take 1 tablet (20 mg total) by mouth daily. 90 tablet 3  . calcium citrate-vitamin D (CITRACAL+D) 315-200 MG-UNIT per tablet Take 2 tablets by mouth daily.     . carisoprodol (SOMA) 350 MG tablet Take 1 tablet (350 mg total) by mouth 2 (two) times  daily as needed. 60 tablet 2  . denosumab (PROLIA) 60 MG/ML SOLN injection Inject 60 mg into the skin every 6 (six) months. Administer in upper arm, thigh, or abdomen    . gabapentin (NEURONTIN) 100 MG capsule Take 100 mg by mouth 3 (three) times daily.    Marland Kitchen letrozole (FEMARA) 2.5 MG tablet Take 2.5 mg by mouth daily.    . Methylcellulose, Laxative, (CITRUCEL PO) Take 2 tablets by mouth daily.     . ranitidine (ZANTAC) 150 MG tablet     . Respiratory Therapy Supplies (FLUTTER) DEVI Use as directed. 1 each 0  . RESTASIS 0.05 % ophthalmic emulsion   3  . temazepam (RESTORIL) 15 MG capsule TAKE 1 OR 2 CAPSULES BY MOUTH EVERY NIGHT AT BEDTIME AS NEEDED FOR SLEEP 60 capsule 5   No  current facility-administered medications on file prior to visit.   Review of Systems  Constitutional: Negative for unusual diaphoresis or night sweats HENT: Negative for ringing in ear or discharge Eyes: Negative for double vision or worsening visual disturbance.  Respiratory: Negative for choking and stridor.   Gastrointestinal: Negative for vomiting or other signifcant bowel change Genitourinary: Negative for hematuria or change in urine volume.  Musculoskeletal: Negative for other MSK pain or swelling Skin: Negative for color change and worsening wound.  Neurological: Negative for tremors and numbness other than noted  Psychiatric/Behavioral: Negative for decreased concentration or agitation other than above       Objective:   Physical Exam BP 100/60 mmHg  Pulse 97  Temp(Src) 98.2 F (36.8 C) (Oral)  Resp 20  Wt 112 lb (50.803 kg)  SpO2 97% VS noted, non toxic Constitutional: Pt appears in no significant distress HENT: Head: NCAT.  Right Ear: External ear normal.  Left Ear: External ear normal.  Eyes: . Pupils are equal, round, and reactive to light. Conjunctivae and EOM are normal Neck: Normal range of motion. Neck supple.  Cardiovascular: Normal rate and regular rhythm.   Pulmonary/Chest: Effort normal and breath sounds without rales or wheezing.  Abd:  Soft, NT, ND, + BS Neurological: Pt is alert. Not confused , motor grossly intact Skin: Skin is warm. No rash, no LE edema Psychiatric: Pt behavior is normal. No agitation.     Assessment & Plan:

## 2016-02-29 NOTE — Patient Instructions (Addendum)
Ok to hold on taking the Benicar HCT at all tomorrow  Then start 1/2 tab per day to finish it up  Then start generic Benicar HCT at 20/12.5 mg per day  Please continue to monitor you Blood Pressure at home, with the goal being to be at least less than 140/90  Please continue all other medications as before, and refills have been done if requested.  Please have the pharmacy call with any other refills you may need.  Please continue your efforts at being more active, low cholesterol diet, and weight control.  Please keep your appointments with your specialists as you may have planned  Please return in 6 months, or sooner if needed

## 2016-03-04 NOTE — Assessment & Plan Note (Signed)
stable overall by history and exam, recent data reviewed with pt, and pt to continue medical treatment as before,  to f/u any worsening symptoms or concerns Lab Results  Component Value Date   HGBA1C 5.9 12/01/2015    

## 2016-03-04 NOTE — Assessment & Plan Note (Signed)
stable overall by history and exam, recent data reviewed with pt, and pt to continue medical treatment as before,  to f/u any worsening symptoms or concerns SpO2 Readings from Last 3 Encounters:  02/29/16 97%  01/27/16 98%  01/21/16 98%

## 2016-03-04 NOTE — Assessment & Plan Note (Signed)
Overcontrolled, for change benicar HCT to 120/12.5 mg prn, continue to monitor for symtpoms

## 2016-03-16 DIAGNOSIS — R002 Palpitations: Secondary | ICD-10-CM | POA: Diagnosis not present

## 2016-03-16 DIAGNOSIS — I10 Essential (primary) hypertension: Secondary | ICD-10-CM | POA: Diagnosis not present

## 2016-03-16 DIAGNOSIS — Z681 Body mass index (BMI) 19 or less, adult: Secondary | ICD-10-CM | POA: Diagnosis not present

## 2016-03-16 DIAGNOSIS — R5383 Other fatigue: Secondary | ICD-10-CM | POA: Diagnosis not present

## 2016-04-04 DIAGNOSIS — C44311 Basal cell carcinoma of skin of nose: Secondary | ICD-10-CM | POA: Diagnosis not present

## 2016-04-25 ENCOUNTER — Ambulatory Visit: Payer: BLUE CROSS/BLUE SHIELD | Admitting: Internal Medicine

## 2016-04-27 DIAGNOSIS — L905 Scar conditions and fibrosis of skin: Secondary | ICD-10-CM | POA: Diagnosis not present

## 2016-04-27 DIAGNOSIS — L570 Actinic keratosis: Secondary | ICD-10-CM | POA: Diagnosis not present

## 2016-04-27 DIAGNOSIS — Z08 Encounter for follow-up examination after completed treatment for malignant neoplasm: Secondary | ICD-10-CM | POA: Diagnosis not present

## 2016-04-27 DIAGNOSIS — L578 Other skin changes due to chronic exposure to nonionizing radiation: Secondary | ICD-10-CM | POA: Diagnosis not present

## 2016-04-27 DIAGNOSIS — Z85828 Personal history of other malignant neoplasm of skin: Secondary | ICD-10-CM | POA: Diagnosis not present

## 2016-05-28 IMAGING — CT CT CHEST W/O CM
2 of 3 series · 15 of 36 positions shown, 18 images · non-contrast
Comparison: Multiple prior CTs ranging from 06/20/2006 through
01/12/2014.

CLINICAL DATA: Atypical mycobacterial infection. History of
bronchiectasis and breast cancer. Subsequent encounter.

EXAM:
CT CHEST WITHOUT CONTRAST
TECHNIQUE: Multidetector CT imaging of the chest was performed following the
standard protocol without IV contrast..

[Series 2: chest 5.0 b31f · axial · 0.60mm/px · z∈[-470,-185]mm · 12 of 69 slices shown, 15 images]
[im 6/69  mediastinal]
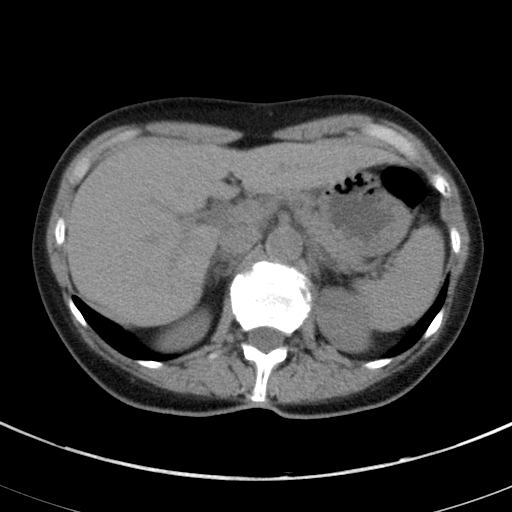
[im 6/69  lung]
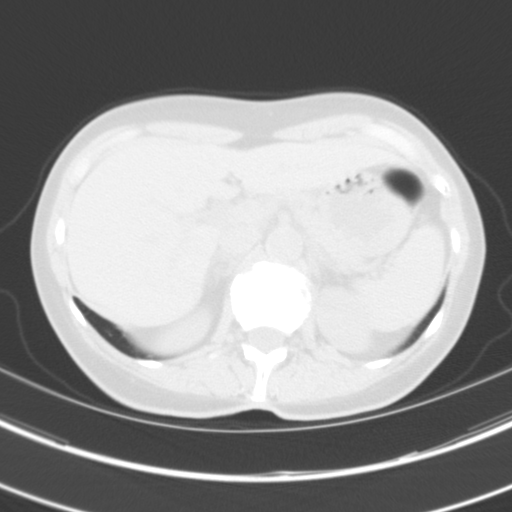
[im 11/69  lung]
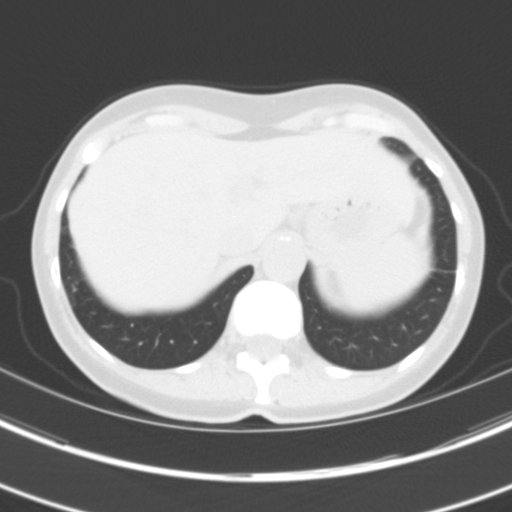
[im 16/69  lung]
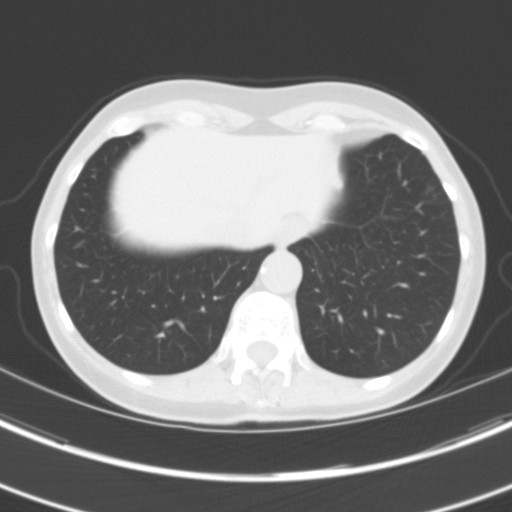
[im 21/69  lung]
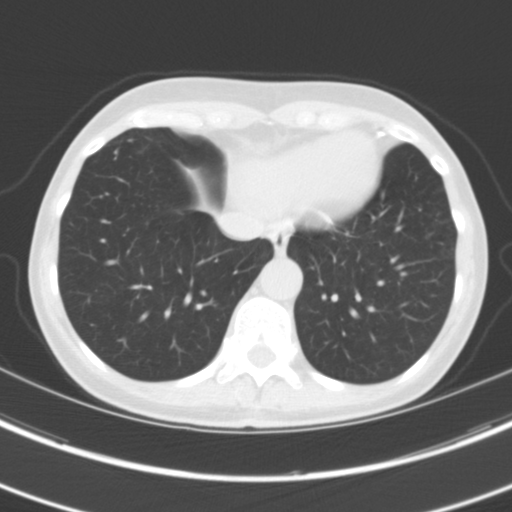
[im 26/69  mediastinal]
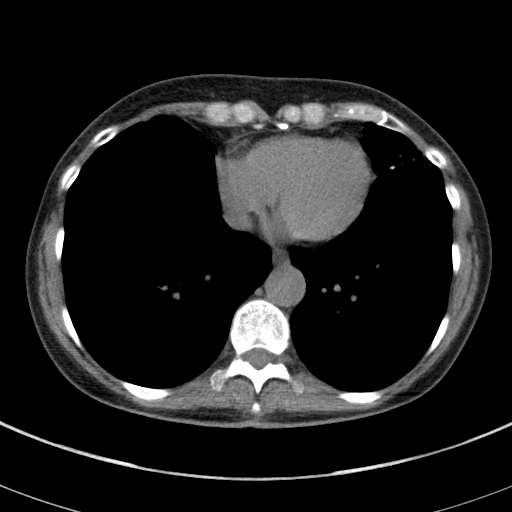
[im 26/69  lung]
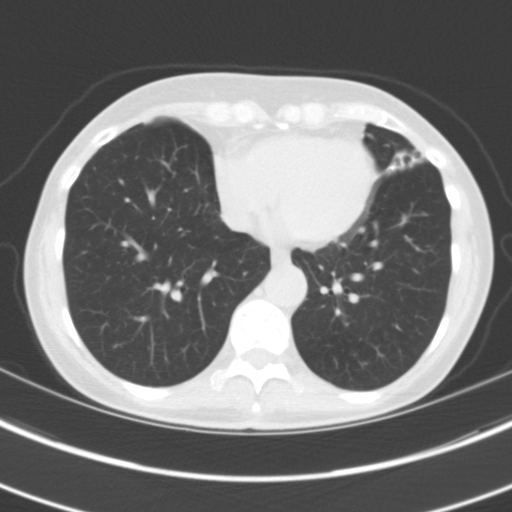
[im 31/69  lung]
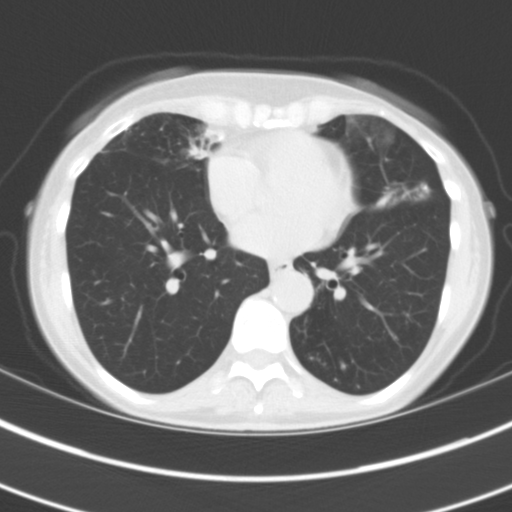
[im 38/69  lung]
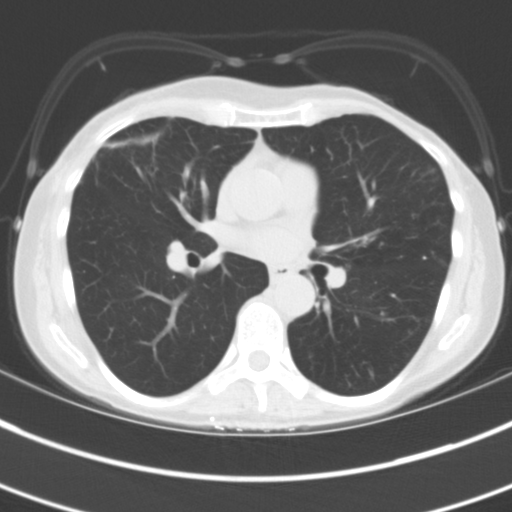
[im 43/69  lung]
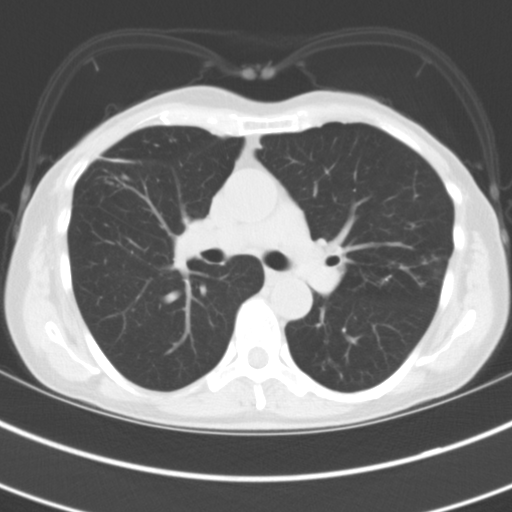
[im 48/69  mediastinal]
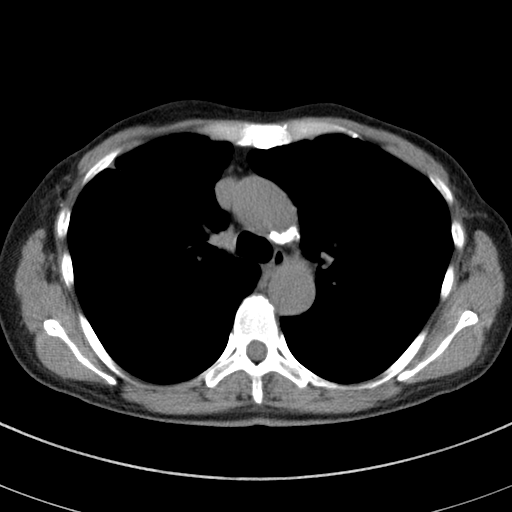
[im 48/69  lung]
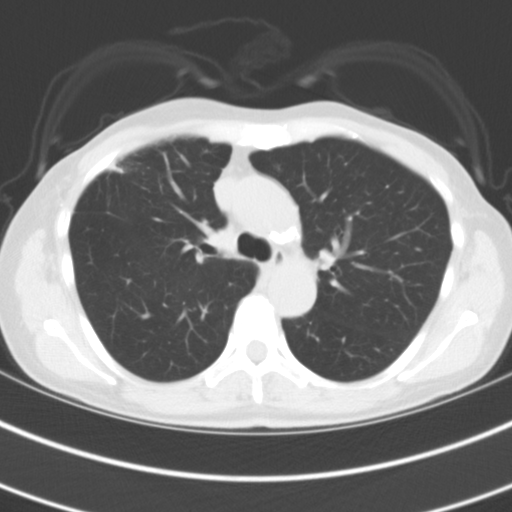
[im 53/69  lung]
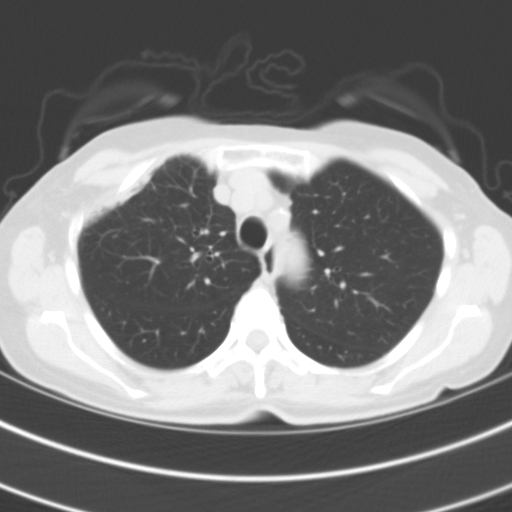
[im 58/69  lung]
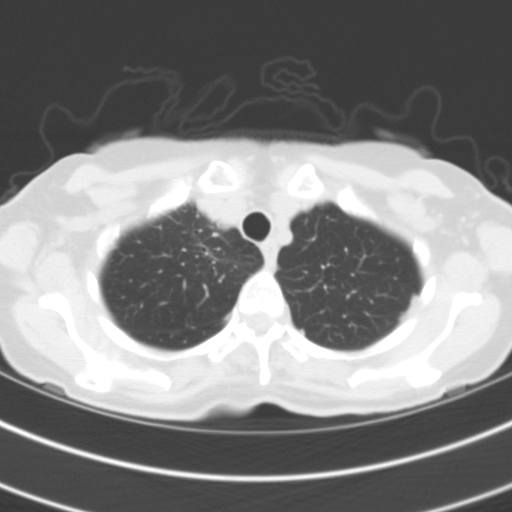
[im 63/69  lung]
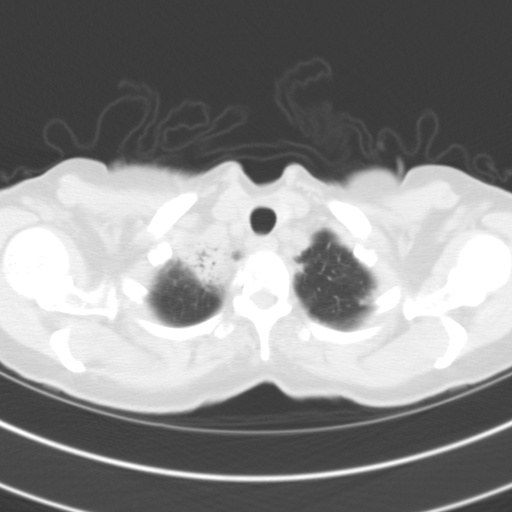

[Series 6: chest 3.0 coronal · coronal · 0.66mm/px · 3 of 72 slices shown]
[im 15/72  lung]
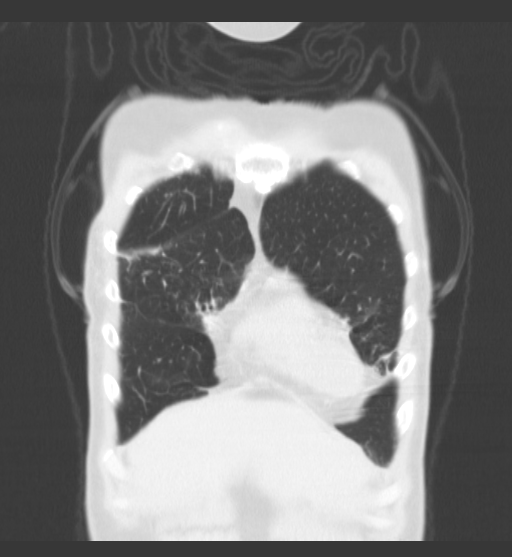
[im 29/72  lung]
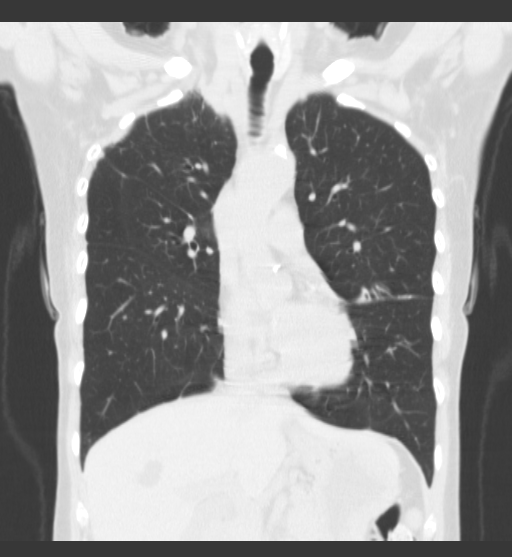
[im 43/72  lung]
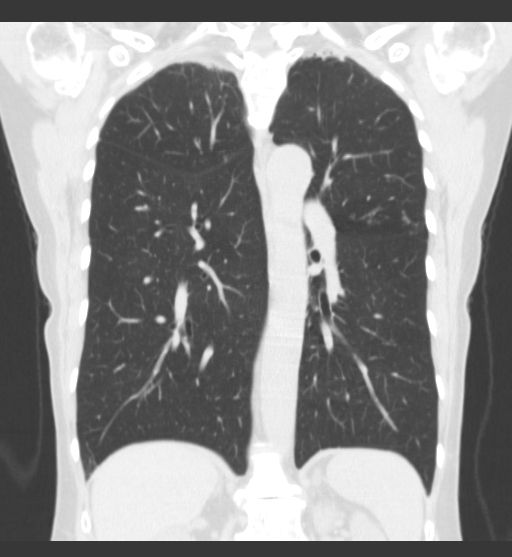

[15 of 36 positions shown; findings below may reference images not displayed]

FINDINGS: Mediastinum/Nodes: There are no enlarged mediastinal, hilar or
axillary lymph nodes. The thyroid gland, trachea and esophagus
demonstrate no significant findings. The heart size is normal. There
is no pericardial effusion.There is stable mild atherosclerosis of
the aorta, great vessels and coronary arteries.

Lungs/Pleura: There is no pleural effusion. Compared with the most
recent examination, there has slightly progressive consolidation
with air bronchograms at the right apex. There is underlying
biapical scarring with volume loss. There is asymmetric extension
anteriorly on the right, likely related to prior radiation therapy.
Underlying bronchiectasis within the right upper and middle lobes
and lingula is not significantly changed. There is stable chronic
peribronchial nodularity within the lingula and left lower lobe. No
suspicious pulmonary nodules demonstrated.

Upper abdomen:  Stable central hepatic cyst.  No adrenal mass.

Musculoskeletal/Chest wall: Stable postsurgical changes from
previous bilateral mastectomy and right axillary node dissection. No
chest wall lesion or suspicious osseous findings.
IMPRESSION: 1. Interval slight progression in right apical consolidation with
air bronchograms, probably progression of radiation fibrosis. The
left apical component is unchanged.
2. Otherwise stable chronic lung disease with bronchiectasis,
scarring and peribronchial nodularity consistent with chronic
atypical mycobacterial infection.
3. No evidence of metastatic disease or acute process.

## 2016-05-31 DIAGNOSIS — Z79899 Other long term (current) drug therapy: Secondary | ICD-10-CM | POA: Diagnosis not present

## 2016-05-31 DIAGNOSIS — Z08 Encounter for follow-up examination after completed treatment for malignant neoplasm: Secondary | ICD-10-CM | POA: Diagnosis not present

## 2016-05-31 DIAGNOSIS — Z5181 Encounter for therapeutic drug level monitoring: Secondary | ICD-10-CM | POA: Diagnosis not present

## 2016-05-31 DIAGNOSIS — Z79811 Long term (current) use of aromatase inhibitors: Secondary | ICD-10-CM | POA: Diagnosis not present

## 2016-05-31 DIAGNOSIS — Z86718 Personal history of other venous thrombosis and embolism: Secondary | ICD-10-CM | POA: Diagnosis not present

## 2016-05-31 DIAGNOSIS — C50411 Malignant neoplasm of upper-outer quadrant of right female breast: Secondary | ICD-10-CM | POA: Diagnosis not present

## 2016-05-31 DIAGNOSIS — M810 Age-related osteoporosis without current pathological fracture: Secondary | ICD-10-CM | POA: Diagnosis not present

## 2016-05-31 DIAGNOSIS — Z681 Body mass index (BMI) 19 or less, adult: Secondary | ICD-10-CM | POA: Diagnosis not present

## 2016-05-31 DIAGNOSIS — Z853 Personal history of malignant neoplasm of breast: Secondary | ICD-10-CM | POA: Diagnosis not present

## 2016-07-14 DIAGNOSIS — C50411 Malignant neoplasm of upper-outer quadrant of right female breast: Secondary | ICD-10-CM | POA: Diagnosis not present

## 2016-07-25 DIAGNOSIS — K7689 Other specified diseases of liver: Secondary | ICD-10-CM | POA: Diagnosis not present

## 2016-07-25 DIAGNOSIS — R945 Abnormal results of liver function studies: Secondary | ICD-10-CM | POA: Diagnosis not present

## 2016-07-25 DIAGNOSIS — R748 Abnormal levels of other serum enzymes: Secondary | ICD-10-CM | POA: Diagnosis not present

## 2016-08-09 DIAGNOSIS — Z23 Encounter for immunization: Secondary | ICD-10-CM | POA: Diagnosis not present

## 2016-08-10 ENCOUNTER — Ambulatory Visit (INDEPENDENT_AMBULATORY_CARE_PROVIDER_SITE_OTHER): Payer: Medicare Other | Admitting: Pulmonary Disease

## 2016-08-10 ENCOUNTER — Encounter: Payer: Self-pay | Admitting: Pulmonary Disease

## 2016-08-10 DIAGNOSIS — J479 Bronchiectasis, uncomplicated: Secondary | ICD-10-CM | POA: Diagnosis not present

## 2016-08-10 DIAGNOSIS — A310 Pulmonary mycobacterial infection: Secondary | ICD-10-CM

## 2016-08-10 NOTE — Patient Instructions (Signed)
Call as needed 

## 2016-08-10 NOTE — Assessment & Plan Note (Signed)
CXR once/ yr - last 01/2016 reviewed - nml She received the flu shot already She will call for future attacks of bronchitis We discussed symptoms of worsening Lung function appears stable

## 2016-08-10 NOTE — Assessment & Plan Note (Signed)
May need longer course of antibiotics 10 days for flareups of bronchitis

## 2016-08-10 NOTE — Progress Notes (Signed)
   Subjective:    Patient ID: Diane Haney, female    DOB: 05-24-1946, 70 y.o.   MRN: [id number]  HPI   37 y o. female with bronchiectasis and MAI   Was seen  by ID Johnnye Sima) , for recurrent MAI with previous tx of MAI triple therapy rx.  Finished last course (75mon) in Feb 2016 .  08/10/2016  Chief Complaint  Patient presents with  . Follow-up    doing ok.  no concerns.    3m FU Had one episode of bronchitis in the summer HAs an occasional dry cough  Has tried spiriva and symbicort but did not like them because of side effect of palpitation./heart racing.  Denies chest pain, discolored mucus, fever or orthopnea.     Took  flu shot a week ago  Significant tests/ events 2011 spirometry normal CT 01/2015>Interval slight progression in right apical consolidation with air bronchograms, probably progression of radiation fibrosis. The left apical component is unchanged. - stable chronic lung disease with bronchiectasis,scarring and peribronchial nodularity consistent     Review of Systems Patient denies significant dyspnea,cough, hemoptysis,  chest pain, palpitations, pedal edema, orthopnea, paroxysmal nocturnal dyspnea, lightheadedness, nausea, vomiting, abdominal or  leg pains      Objective:   Physical Exam   Gen. Pleasant, thin , in no distress ENT - no lesions, no post nasal drip Neck: No JVD, no thyromegaly, no carotid bruits Lungs: no use of accessory muscles, no dullness to percussion, clear without rales or rhonchi  Cardiovascular: Rhythm regular, heart sounds  normal, no murmurs or gallops, no peripheral edema Musculoskeletal: No deformities, no cyanosis or clubbing         Assessment & Plan:

## 2016-08-29 DIAGNOSIS — H5203 Hypermetropia, bilateral: Secondary | ICD-10-CM | POA: Diagnosis not present

## 2016-08-29 DIAGNOSIS — H43813 Vitreous degeneration, bilateral: Secondary | ICD-10-CM | POA: Diagnosis not present

## 2016-08-29 DIAGNOSIS — H16223 Keratoconjunctivitis sicca, not specified as Sjogren's, bilateral: Secondary | ICD-10-CM | POA: Diagnosis not present

## 2016-08-29 DIAGNOSIS — Z961 Presence of intraocular lens: Secondary | ICD-10-CM | POA: Diagnosis not present

## 2016-08-29 DIAGNOSIS — H02834 Dermatochalasis of left upper eyelid: Secondary | ICD-10-CM | POA: Diagnosis not present

## 2016-08-29 DIAGNOSIS — H02831 Dermatochalasis of right upper eyelid: Secondary | ICD-10-CM | POA: Diagnosis not present

## 2016-08-31 ENCOUNTER — Ambulatory Visit (INDEPENDENT_AMBULATORY_CARE_PROVIDER_SITE_OTHER): Payer: Medicare Other | Admitting: Internal Medicine

## 2016-08-31 ENCOUNTER — Encounter: Payer: Self-pay | Admitting: Internal Medicine

## 2016-08-31 VITALS — BP 112/64 | HR 75 | Temp 98.0°F | Resp 20 | Wt 111.0 lb

## 2016-08-31 DIAGNOSIS — I10 Essential (primary) hypertension: Secondary | ICD-10-CM

## 2016-08-31 DIAGNOSIS — R7302 Impaired glucose tolerance (oral): Secondary | ICD-10-CM

## 2016-08-31 DIAGNOSIS — E785 Hyperlipidemia, unspecified: Secondary | ICD-10-CM

## 2016-08-31 DIAGNOSIS — J383 Other diseases of vocal cords: Secondary | ICD-10-CM | POA: Insufficient documentation

## 2016-08-31 NOTE — Progress Notes (Signed)
Subjective:    Patient ID: Diane Haney, female    DOB: Oct 27, 1946, 70 y.o.   MRN: [id number]  HPI  Here for yearly f/u;  Overall doing ok;  Pt denies Chest pain, worsening SOB, DOE, wheezing, orthopnea, PND, worsening LE edema, palpitations, or syncope but has recurring dizziness with standing occasonally, but every day.  BP running lower lately.   Pt denies neurological change such as new headache, facial or extremity weakness.  Pt denies polydipsia, polyuria, or low sugar symptoms. Pt states overall good compliance with treatment and medications, good tolerability, and has been trying to follow appropriate diet.  Pt denies worsening depressive symptoms, suicidal ideation or panic. No fever, night sweats, wt loss, loss of appetite, or other constitutional symptoms.  Pt states good ability with ADL's, has low fall risk, home safety reviewed and adequate, no other significant changes in hearing or vision, and only occasionally active with exercise. BP Readings from Last 3 Encounters:  08/31/16 112/64  08/10/16 97/64  02/29/16 100/60   Wt Readings from Last 3 Encounters:  08/31/16 111 lb (50.3 kg)  08/10/16 112 lb (50.8 kg)  02/29/16 112 lb (50.8 kg)   Past Medical History:  Diagnosis Date  . Breast cancer ([place]) 11/12/2012   Dx oct 2013 - [place] - Invasive ductal carcinoma, s/o bilat mastectomy with + margin  - also for XRT soon  . Bronchiectasis   . Chronic fatigue fibromyalgia syndrome 11/12/2012  . COPD (chronic obstructive pulmonary disease) ([place])   . DVT, lower extremity ([facility])    LLE  . Fibromyalgia   . GERD (gastroesophageal reflux disease)   . History of shingles   . Hyperlipidemia   . Hypertension   . Impaired glucose tolerance 11/14/2013  . Migraine   . Osteopenia   . Osteoporosis 10/16/2007   Qualifier: Diagnosis of  By: Jenny Reichmann MD, Hunt Oris   . Pulmonary Mycobacterium avium complex (MAC) infection ([place])   . Squamous cell skin cancer, multiple sites     Past Surgical History:  Procedure Laterality Date  . FOOT SURGERY    . mastectomy bilateral oct 2013    . shoulder impingement    . VIDEO BRONCHOSCOPY N/A 05/07/2014   Procedure: VIDEO BRONCHOSCOPY WITHOUT FLUORO;  Surgeon: Elsie Stain, MD;  Location: [facility];  Service: Cardiopulmonary;  Laterality: N/A;    reports that she quit smoking about 30 years ago. Her smoking use included Cigarettes. She has a 25.00 pack-year smoking history. She has never used smokeless tobacco. She reports that she does not drink alcohol or use drugs. family history includes Breast cancer in her other; Colon polyps in her father; Heart disease in her other; Hyperlipidemia in her other; Hypertension in her other; Lung cancer in her father; Other in her other. Allergies  Allergen Reactions  . Losartan     headache  . Adhesive [Tape]     Blisters  . Ciprofloxacin     REACTION: tendinitis  . Clarithromycin     REACTION: severe reflux  . Prednisone     REACTION: irregular heartbeat, not able to sleep   Current Outpatient Prescriptions on File Prior to Visit  Medication Sig Dispense Refill  . aspirin 81 MG EC tablet Take 81 mg by mouth daily.      Marland Kitchen atorvastatin (LIPITOR) 20 MG tablet Take 1 tablet (20 mg total) by mouth daily. 90 tablet 3  . calcium citrate-vitamin D (CITRACAL+D) 315-200 MG-UNIT per tablet Take 2 tablets by mouth  daily.     . denosumab (PROLIA) 60 MG/ML SOLN injection Inject 60 mg into the skin every 6 (six) months. Administer in upper arm, thigh, or abdomen    . gabapentin (NEURONTIN) 100 MG capsule Take 100 mg by mouth 3 (three) times daily.    Marland Kitchen letrozole (FEMARA) 2.5 MG tablet Take 2.5 mg by mouth daily.    . Methylcellulose, Laxative, (CITRUCEL PO) Take 2 tablets by mouth daily.     Marland Kitchen olmesartan-hydrochlorothiazide (BENICAR HCT) 20-12.5 MG tablet Take 1 tablet by mouth daily. 90 tablet 3  . ranitidine (ZANTAC) 150 MG tablet     . Respiratory Therapy Supplies (FLUTTER) DEVI  Use as directed. 1 each 0  . RESTASIS 0.05 % ophthalmic emulsion   3  . temazepam (RESTORIL) 15 MG capsule TAKE 1 OR 2 CAPSULES BY MOUTH EVERY NIGHT AT BEDTIME AS NEEDED FOR SLEEP 60 capsule 5   No current facility-administered medications on file prior to visit.    Review of Systems Constitutional: Negative for increased diaphoresis, or other activity, appetite or siginficant weight change other than noted HENT: Negative for worsening hearing loss, ear pain, facial swelling, mouth sores and neck stiffness.   Eyes: Negative for other worsening pain, redness or visual disturbance.  Respiratory: Negative for choking or stridor Cardiovascular: Negative for other chest pain and palpitations.  Gastrointestinal: Negative for worsening diarrhea, blood in stool, or abdominal distention Genitourinary: Negative for hematuria, flank pain or change in urine volume.  Musculoskeletal: Negative for myalgias or other joint complaints.  Skin: Negative for other color change and wound or drainage.  Neurological: Negative for syncope and numbness. other than noted Hematological: Negative for adenopathy. or other swelling Psychiatric/Behavioral: Negative for hallucinations, SI, self-injury, decreased concentration or other worsening agitation.      Objective:   Physical Exam BP 112/64   Pulse 75   Temp 98 F (36.7 C) (Oral)   Resp 20   Wt 111 lb (50.3 kg)   SpO2 97%   BMI 21.32 kg/m  VS noted,  Constitutional: Pt is oriented to person, place, and time. Appears well-developed and well-nourished, in no significant distress Head: Normocephalic and atraumatic  Eyes: Conjunctivae and EOM are normal. Pupils are equal, round, and reactive to light Right Ear: External ear normal.  Left Ear: External ear normal Nose: Nose normal.  Mouth/Throat: Oropharynx is clear and moist  Neck: Normal range of motion. Neck supple. No JVD present. No tracheal deviation present or significant neck LA or  mass Cardiovascular: Normal rate, regular rhythm, normal heart sounds and intact distal pulses.   Pulmonary/Chest: Effort normal and breath sounds without rales or wheezing  Abdominal: Soft. Bowel sounds are normal. NT. No HSM  Musculoskeletal: Normal range of motion. Exhibits no edema Lymphadenopathy: Has no cervical adenopathy.  Neurological: Pt is alert and oriented to person, place, and time. Pt has normal reflexes. No cranial nerve deficit. Motor grossly intact, + dysphonia chronic Skin: Skin is warm and dry. No rash noted or new ulcers Psychiatric:  Has normal mood and affect. Behavior is normal.     Assessment & Plan:

## 2016-08-31 NOTE — Progress Notes (Signed)
Pre visit review using our clinic review tool, if applicable. No additional management support is needed unless otherwise documented below in the visit note. 

## 2016-08-31 NOTE — Patient Instructions (Signed)
Please continue all other medications as before, and refills have been done if requested.  Please have the pharmacy call with any other refills you may need.  Please continue your efforts at being more active, low cholesterol diet, and weight control.  You are otherwise up to date with prevention measures today.  Please keep your appointments with your specialists as you may have planned  Please return in 3 months, or sooner if needed 

## 2016-09-02 NOTE — Assessment & Plan Note (Signed)
stable overall by history and exam, recent data reviewed with pt, and pt to continue medical treatment as before,  to f/u any worsening symptoms or concerns Lab Results  Component Value Date   HGBA1C 5.9 12/01/2015

## 2016-09-02 NOTE — Assessment & Plan Note (Signed)
stable overall by history and exam, recent data reviewed with pt, and pt to continue medical treatment as before,  to f/u any worsening symptoms or concerns Lab Results  Component Value Date   LDLCALC 85 12/01/2015

## 2016-09-02 NOTE — Assessment & Plan Note (Signed)
stable overall by history and exam, recent data reviewed with pt, and pt to continue medical treatment as before,  to f/u any worsening symptoms or concerns BP Readings from Last 3 Encounters:  08/31/16 112/64  08/10/16 97/64  02/29/16 100/60

## 2016-09-29 DIAGNOSIS — C50411 Malignant neoplasm of upper-outer quadrant of right female breast: Secondary | ICD-10-CM | POA: Diagnosis not present

## 2016-09-29 DIAGNOSIS — Z9011 Acquired absence of right breast and nipple: Secondary | ICD-10-CM | POA: Diagnosis not present

## 2016-09-29 DIAGNOSIS — Z923 Personal history of irradiation: Secondary | ICD-10-CM | POA: Diagnosis not present

## 2016-09-29 DIAGNOSIS — Z681 Body mass index (BMI) 19 or less, adult: Secondary | ICD-10-CM | POA: Diagnosis not present

## 2016-09-29 DIAGNOSIS — G8929 Other chronic pain: Secondary | ICD-10-CM | POA: Diagnosis not present

## 2016-09-29 DIAGNOSIS — C50911 Malignant neoplasm of unspecified site of right female breast: Secondary | ICD-10-CM | POA: Diagnosis not present

## 2016-09-29 DIAGNOSIS — R0789 Other chest pain: Secondary | ICD-10-CM | POA: Diagnosis not present

## 2016-09-29 DIAGNOSIS — Z79811 Long term (current) use of aromatase inhibitors: Secondary | ICD-10-CM | POA: Diagnosis not present

## 2016-09-29 DIAGNOSIS — Z79899 Other long term (current) drug therapy: Secondary | ICD-10-CM | POA: Diagnosis not present

## 2016-09-29 DIAGNOSIS — Z170 Estrogen receptor positive status [ER+]: Secondary | ICD-10-CM | POA: Diagnosis not present

## 2016-09-29 DIAGNOSIS — M549 Dorsalgia, unspecified: Secondary | ICD-10-CM | POA: Diagnosis not present

## 2016-11-07 DIAGNOSIS — L814 Other melanin hyperpigmentation: Secondary | ICD-10-CM | POA: Diagnosis not present

## 2016-11-07 DIAGNOSIS — L603 Nail dystrophy: Secondary | ICD-10-CM | POA: Diagnosis not present

## 2016-11-07 DIAGNOSIS — D485 Neoplasm of uncertain behavior of skin: Secondary | ICD-10-CM | POA: Diagnosis not present

## 2016-11-07 DIAGNOSIS — L570 Actinic keratosis: Secondary | ICD-10-CM | POA: Diagnosis not present

## 2016-11-07 DIAGNOSIS — Z08 Encounter for follow-up examination after completed treatment for malignant neoplasm: Secondary | ICD-10-CM | POA: Diagnosis not present

## 2016-11-07 DIAGNOSIS — C44719 Basal cell carcinoma of skin of left lower limb, including hip: Secondary | ICD-10-CM | POA: Diagnosis not present

## 2016-11-07 DIAGNOSIS — Z85828 Personal history of other malignant neoplasm of skin: Secondary | ICD-10-CM | POA: Diagnosis not present

## 2016-11-13 ENCOUNTER — Encounter: Payer: Self-pay | Admitting: Internal Medicine

## 2016-11-13 MED ORDER — ATORVASTATIN CALCIUM 20 MG PO TABS: 20.0000 mg | ORAL_TABLET | Freq: Every day | ORAL | 2 refills | Status: DC

## 2016-11-14 ENCOUNTER — Other Ambulatory Visit: Payer: Self-pay | Admitting: *Deleted

## 2016-11-14 MED ORDER — ATORVASTATIN CALCIUM 20 MG PO TABS: 20.0000 mg | ORAL_TABLET | Freq: Every day | ORAL | 0 refills | Status: DC

## 2016-11-16 ENCOUNTER — Other Ambulatory Visit: Payer: Self-pay

## 2016-11-29 DIAGNOSIS — K219 Gastro-esophageal reflux disease without esophagitis: Secondary | ICD-10-CM | POA: Diagnosis not present

## 2016-12-01 DIAGNOSIS — Z681 Body mass index (BMI) 19 or less, adult: Secondary | ICD-10-CM | POA: Diagnosis not present

## 2016-12-01 DIAGNOSIS — Z79899 Other long term (current) drug therapy: Secondary | ICD-10-CM | POA: Diagnosis not present

## 2016-12-01 DIAGNOSIS — R740 Nonspecific elevation of levels of transaminase and lactic acid dehydrogenase [LDH]: Secondary | ICD-10-CM | POA: Diagnosis not present

## 2016-12-01 DIAGNOSIS — Z9013 Acquired absence of bilateral breasts and nipples: Secondary | ICD-10-CM | POA: Diagnosis not present

## 2016-12-01 DIAGNOSIS — Z08 Encounter for follow-up examination after completed treatment for malignant neoplasm: Secondary | ICD-10-CM | POA: Diagnosis not present

## 2016-12-01 DIAGNOSIS — Z79811 Long term (current) use of aromatase inhibitors: Secondary | ICD-10-CM | POA: Diagnosis not present

## 2016-12-01 DIAGNOSIS — Z170 Estrogen receptor positive status [ER+]: Secondary | ICD-10-CM | POA: Diagnosis not present

## 2016-12-01 DIAGNOSIS — M810 Age-related osteoporosis without current pathological fracture: Secondary | ICD-10-CM | POA: Diagnosis not present

## 2016-12-01 DIAGNOSIS — Z5181 Encounter for therapeutic drug level monitoring: Secondary | ICD-10-CM | POA: Diagnosis not present

## 2016-12-01 DIAGNOSIS — C50411 Malignant neoplasm of upper-outer quadrant of right female breast: Secondary | ICD-10-CM | POA: Diagnosis not present

## 2016-12-01 DIAGNOSIS — Z803 Family history of malignant neoplasm of breast: Secondary | ICD-10-CM | POA: Diagnosis not present

## 2016-12-01 DIAGNOSIS — Z853 Personal history of malignant neoplasm of breast: Secondary | ICD-10-CM | POA: Diagnosis not present

## 2016-12-06 ENCOUNTER — Encounter: Payer: Self-pay | Admitting: Internal Medicine

## 2016-12-06 ENCOUNTER — Ambulatory Visit (INDEPENDENT_AMBULATORY_CARE_PROVIDER_SITE_OTHER): Payer: Medicare Other | Admitting: Internal Medicine

## 2016-12-06 ENCOUNTER — Other Ambulatory Visit (INDEPENDENT_AMBULATORY_CARE_PROVIDER_SITE_OTHER): Payer: Medicare Other

## 2016-12-06 VITALS — BP 120/70 | HR 97 | Temp 98.2°F | Resp 20 | Wt 112.0 lb

## 2016-12-06 DIAGNOSIS — R7302 Impaired glucose tolerance (oral): Secondary | ICD-10-CM

## 2016-12-06 DIAGNOSIS — I10 Essential (primary) hypertension: Secondary | ICD-10-CM

## 2016-12-06 DIAGNOSIS — E785 Hyperlipidemia, unspecified: Secondary | ICD-10-CM

## 2016-12-06 DIAGNOSIS — M546 Pain in thoracic spine: Secondary | ICD-10-CM

## 2016-12-06 DIAGNOSIS — M549 Dorsalgia, unspecified: Secondary | ICD-10-CM | POA: Insufficient documentation

## 2016-12-06 DIAGNOSIS — M545 Low back pain, unspecified: Secondary | ICD-10-CM | POA: Insufficient documentation

## 2016-12-06 DIAGNOSIS — G8929 Other chronic pain: Secondary | ICD-10-CM | POA: Insufficient documentation

## 2016-12-06 LAB — LIPID PANEL
CHOL/HDL RATIO: 2
Cholesterol: 176 mg/dL (ref 0–200)
HDL: 80.1 mg/dL (ref >39.00)
LDL Cholesterol: 82 mg/dL (ref 0–99)
NONHDL: 96.11
Triglycerides: 71 mg/dL (ref 0.0–149.0)
VLDL: 14.2 mg/dL (ref 0.0–40.0)

## 2016-12-06 LAB — URINALYSIS, ROUTINE W REFLEX MICROSCOPIC
Bilirubin Urine: NEGATIVE
Ketones, ur: NEGATIVE
Leukocytes, UA: NEGATIVE
NITRITE: NEGATIVE
PH: 6 (ref 5.0–8.0)
Specific Gravity, Urine: 1.01 (ref 1.000–1.030)
TOTAL PROTEIN, URINE-UPE24: NEGATIVE
Urine Glucose: NEGATIVE
Urobilinogen, UA: 0.2 (ref 0.0–1.0)
WBC, UA: NONE SEEN (ref 0–?)

## 2016-12-06 LAB — TSH: TSH: 3.96 u[IU]/mL (ref 0.35–4.50)

## 2016-12-06 LAB — HEMOGLOBIN A1C: HEMOGLOBIN A1C: 6 % (ref 4.6–6.5)

## 2016-12-06 MED ORDER — TEMAZEPAM 15 MG PO CAPS: ORAL_CAPSULE | ORAL | 5 refills | Status: DC

## 2016-12-06 MED ORDER — CARISOPRODOL 350 MG PO TABS: 350.0000 mg | ORAL_TABLET | Freq: Two times a day (BID) | ORAL | 2 refills | Status: DC | PRN

## 2016-12-06 MED ORDER — OLMESARTAN MEDOXOMIL-HCTZ 20-12.5 MG PO TABS: 0.5000 | ORAL_TABLET | Freq: Every day | ORAL | 3 refills | Status: DC

## 2016-12-06 MED ORDER — AMLODIPINE BESYLATE 5 MG PO TABS: 5.0000 mg | ORAL_TABLET | Freq: Every day | ORAL | 3 refills | Status: DC

## 2016-12-06 NOTE — Progress Notes (Signed)
Pre visit review using our clinic review tool, if applicable. No additional management support is needed unless otherwise documented below in the visit note. 

## 2016-12-06 NOTE — Patient Instructions (Signed)
Please continue all other medications as before, and refills have been done if requested.  Please have the pharmacy call with any other refills you may need.  Please continue your efforts at being more active, low cholesterol diet, and weight control.  You are otherwise up to date with prevention measures today.  Please keep your appointments with your specialists as you may have planned  Please go to the LAB in the Basement (turn left off the elevator) for the tests to be done today  You will be contacted by phone if any changes need to be made immediately.  Otherwise, you will receive a letter about your results with an explanation, but please check with MyChart first.  Please remember to sign up for MyChart if you have not done so, as this will be important to you in the future with finding out test results, communicating by private email, and scheduling acute appointments online when needed.  If you have Medicare related insurance (such as traditoinal Medicare, [employer] or [employer], or similar), Please make an appointment at the Scheduling desk with Maudie Mercury, the [employer], for your Wellness Visit in this office, which is a benefit with your insurance.  Please return in 1 year for your yearly visit, or sooner if needed

## 2016-12-06 NOTE — Progress Notes (Signed)
Subjective:    Patient ID: Diane Haney, female    DOB: 12/26/1945, 70 y.o.   MRN: [id number]  HPI  Here for yearly f/u;  Overall doing ok;  Pt denies Chest pain, worsening SOB, DOE, wheezing, orthopnea, PND, worsening LE edema, palpitations, dizziness or syncope.  Pt denies neurological change such as new headache, facial or extremity weakness.  Pt denies polydipsia, polyuria, or low sugar symptoms. Pt states overall good compliance with treatment and medications, good tolerability, and has been trying to follow appropriate diet.  Pt denies worsening depressive symptoms, suicidal ideation or panic. No fever, night sweats, wt loss, loss of appetite, or other constitutional symptoms.  Pt states good ability with ADL's, has low fall risk, home safety reviewed and adequate, no other significant changes in hearing or vision, and only occasionally active with exercise.  No new changes to history  Asks for refill soma for back pain.  Still with persistent insomnia as well, asks for med refill.  Just had some labs done with oncology Lab Results  Component Value Date   WBC 7.4 12/01/2015   HGB 14.1 12/01/2015   HCT 42.8 12/01/2015   PLT 208.0 12/01/2015   GLUCOSE 104 (H) 12/01/2015   CHOL 179 12/01/2015   TRIG 55.0 12/01/2015   HDL 82.50 12/01/2015   LDLDIRECT 124.4 11/14/2013   LDLCALC 85 12/01/2015   ALT 42 (H) 12/01/2015   AST 39 (H) 12/01/2015   NA 142 12/01/2015   K 4.5 12/01/2015   CL 104 12/01/2015   CREATININE 0.84 12/01/2015   BUN 17 12/01/2015   CO2 29 12/01/2015   TSH 4.06 12/01/2015   HGBA1C 5.9 12/01/2015    Past Medical History:  Diagnosis Date  . Breast cancer ([place]) 11/12/2012   Dx oct 2013 - [place] - Invasive ductal carcinoma, s/o bilat mastectomy with + margin  - also for XRT soon  . Bronchiectasis   . Chronic fatigue fibromyalgia syndrome 11/12/2012  . COPD (chronic obstructive pulmonary disease) ([place])   . DVT, lower extremity (HCC)    LLE  .  Fibromyalgia   . GERD (gastroesophageal reflux disease)   . History of shingles   . Hyperlipidemia   . Hypertension   . Impaired glucose tolerance 11/14/2013  . Migraine   . Osteopenia   . Osteoporosis 10/16/2007   Qualifier: Diagnosis of  By: Jenny Reichmann MD, Hunt Oris   . Pulmonary Mycobacterium avium complex (MAC) infection ([place])   . Squamous cell skin cancer, multiple sites    Past Surgical History:  Procedure Laterality Date  . FOOT SURGERY    . mastectomy bilateral oct 2013    . shoulder impingement    . VIDEO BRONCHOSCOPY N/A 05/07/2014   Procedure: VIDEO BRONCHOSCOPY WITHOUT FLUORO;  Surgeon: Elsie Stain, MD;  Location: [facility];  Service: Cardiopulmonary;  Laterality: N/A;    reports that she quit smoking about 31 years ago. Her smoking use included Cigarettes. She has a 25.00 pack-year smoking history. She has never used smokeless tobacco. She reports that she does not drink alcohol or use drugs. family history includes Breast cancer in her other; Colon polyps in her father; Heart disease in her other; Hyperlipidemia in her other; Hypertension in her other; Lung cancer in her father; Other in her other. Allergies  Allergen Reactions  . Losartan     headache  . Adhesive [Tape]     Blisters  . Ciprofloxacin     REACTION: tendinitis  . Clarithromycin  REACTION: severe reflux  . Prednisone     REACTION: irregular heartbeat, not able to sleep   Current Outpatient Prescriptions on File Prior to Visit  Medication Sig Dispense Refill  . aspirin 81 MG EC tablet Take 81 mg by mouth daily.      Marland Kitchen atorvastatin (LIPITOR) 20 MG tablet Take 1 tablet (20 mg total) by mouth daily. Yearly physical w/labs due in February must see MD for future refills 90 tablet 0  . calcium citrate-vitamin D (CITRACAL+D) 315-200 MG-UNIT per tablet Take 2 tablets by mouth daily.     Marland Kitchen denosumab (PROLIA) 60 MG/ML SOLN injection Inject 60 mg into the skin every 6 (six) months. Administer in upper arm,  thigh, or abdomen    . gabapentin (NEURONTIN) 100 MG capsule Take 100 mg by mouth 3 (three) times daily.    Marland Kitchen letrozole (FEMARA) 2.5 MG tablet Take 2.5 mg by mouth daily.    . Methylcellulose, Laxative, (CITRUCEL PO) Take 2 tablets by mouth daily.     . ranitidine (ZANTAC) 150 MG tablet     . Respiratory Therapy Supplies (FLUTTER) DEVI Use as directed. 1 each 0  . RESTASIS 0.05 % ophthalmic emulsion   3   No current facility-administered medications on file prior to visit.    Review of Systems Constitutional: Negative for increased diaphoresis, or other activity, appetite or siginficant weight change other than noted HENT: Negative for worsening hearing loss, ear pain, facial swelling, mouth sores and neck stiffness.   Eyes: Negative for other worsening pain, redness or visual disturbance.  Respiratory: Negative for choking or stridor Cardiovascular: Negative for other chest pain and palpitations.  Gastrointestinal: Negative for worsening diarrhea, blood in stool, or abdominal distention Genitourinary: Negative for hematuria, flank pain or change in urine volume.  Musculoskeletal: Negative for myalgias or other joint complaints.  Skin: Negative for other color change and wound or drainage.  Neurological: Negative for syncope and numbness. other than noted Hematological: Negative for adenopathy. or other swelling Psychiatric/Behavioral: Negative for hallucinations, SI, self-injury, decreased concentration or other worsening agitation.  All other system neg per pt    Objective:   Physical Exam BP 120/70   Pulse 97   Temp 98.2 F (36.8 C) (Oral)   Resp 20   Wt 112 lb (50.8 kg)   SpO2 94%   BMI 21.51 kg/m  VS noted, thin, somewhat frail Constitutional: Pt is oriented to person, place, and time. Appears well-developed and well-nourished, in no significant distress Head: Normocephalic and atraumatic  Eyes: Conjunctivae and EOM are normal. Pupils are equal, round, and reactive to  light Right Ear: External ear normal.  Left Ear: External ear normal Nose: Nose normal.  Bilat tm's with mild erythema.  Max sinus areas non tender.  Pharynx with mild erythema, no exudate Mouth/Throat: Oropharynx is clear and moist  Neck: Normal range of motion. Neck supple. No JVD present. No tracheal deviation present or significant neck LA or mass S/p bilat mastectomy Cardiovascular: Normal rate, regular rhythm, normal heart sounds and intact distal pulses.   Pulmonary/Chest: Effort normal and breath sounds without rales or wheezing  Abdominal: Soft. Bowel sounds are normal. NT. No HSM  Musculoskeletal: Normal range of motion. Exhibits no edema,but does have chronic recurrent msk spasm just medial to the left scapula Lymphadenopathy: Has no cervical adenopathy.  Neurological: Pt is alert and oriented to person, place, and time. Pt has normal reflexes. No cranial nerve deficit. Motor grossly intact Skin: Skin is warm and dry. No  rash noted or new ulcers Psychiatric:  Has mild nervous mood and affect. Behavior is normal.  No other new exam findings    Assessment & Plan:

## 2016-12-06 NOTE — Assessment & Plan Note (Signed)
stable overall by history and exam, recent data reviewed with pt, and pt to continue medical treatment as before,  to f/u any worsening symptoms or concerns Lab Results  Component Value Date   HGBA1C 5.9 12/01/2015

## 2016-12-06 NOTE — Assessment & Plan Note (Signed)
stable overall by history and exam, recent data reviewed with pt, and pt to continue medical treatment as before,  to f/u any worsening symptoms or concerns Lab Results  Component Value Date   LDLCALC 85 12/01/2015

## 2016-12-06 NOTE — Assessment & Plan Note (Signed)
Chronic left periscapular thoracic level, for soma refill,  to f/u any worsening symptoms or concerns

## 2016-12-06 NOTE — Assessment & Plan Note (Signed)
stable overall by history and exam, recent data reviewed with pt, and pt to continue medical treatment as before,  to f/u any worsening symptoms or concerns BP Readings from Last 3 Encounters:  12/06/16 120/70  08/31/16 112/64  08/10/16 97/64

## 2016-12-14 ENCOUNTER — Telehealth: Payer: Self-pay | Admitting: Internal Medicine

## 2016-12-14 NOTE — Telephone Encounter (Signed)
Pt called back regarding her lab results. She states she has had this forever and there is no need for a urologist referral.  If you have any other questions, just give her a call

## 2016-12-19 DIAGNOSIS — C44719 Basal cell carcinoma of skin of left lower limb, including hip: Secondary | ICD-10-CM | POA: Diagnosis not present

## 2017-01-08 DIAGNOSIS — J101 Influenza due to other identified influenza virus with other respiratory manifestations: Secondary | ICD-10-CM | POA: Diagnosis not present

## 2017-01-12 DIAGNOSIS — J111 Influenza due to unidentified influenza virus with other respiratory manifestations: Secondary | ICD-10-CM | POA: Diagnosis not present

## 2017-01-12 DIAGNOSIS — A319 Mycobacterial infection, unspecified: Secondary | ICD-10-CM | POA: Diagnosis not present

## 2017-02-08 ENCOUNTER — Ambulatory Visit: Payer: Medicare Other | Admitting: Adult Health

## 2017-02-08 ENCOUNTER — Encounter: Payer: Self-pay | Admitting: Adult Health

## 2017-02-08 ENCOUNTER — Ambulatory Visit (INDEPENDENT_AMBULATORY_CARE_PROVIDER_SITE_OTHER): Payer: Medicare Other | Admitting: Adult Health

## 2017-02-08 ENCOUNTER — Ambulatory Visit (INDEPENDENT_AMBULATORY_CARE_PROVIDER_SITE_OTHER)
Admission: RE | Admit: 2017-02-08 | Discharge: 2017-02-08 | Disposition: A | Discharge status: Home / Self Care | Payer: Medicare Other | Source: Ambulatory Visit | Attending: Adult Health | Admitting: Adult Health

## 2017-02-08 VITALS — BP 112/62 | HR 86 | Ht 65.0 in | Wt 112.2 lb

## 2017-02-08 DIAGNOSIS — J479 Bronchiectasis, uncomplicated: Secondary | ICD-10-CM | POA: Diagnosis not present

## 2017-02-08 DIAGNOSIS — A310 Pulmonary mycobacterial infection: Secondary | ICD-10-CM

## 2017-02-08 DIAGNOSIS — R0602 Shortness of breath: Secondary | ICD-10-CM | POA: Diagnosis not present

## 2017-02-08 DIAGNOSIS — R05 Cough: Secondary | ICD-10-CM | POA: Diagnosis not present

## 2017-02-08 NOTE — Progress Notes (Signed)
_0  ID: Diane Haney, female    DOB: 1946/11/06, 71 y.o.   MRN: [id number]  Chief Complaint  Patient presents with  . Follow-up    Bronchiectasis     Referring provider: Biagio Borg, MD  HPI: 71 yo female with bronchiectasis and MAI   Was seen by ID Johnnye Sima) , for recurrent MAI with previous tx of MAI triple therapy rx.  Finished last course (12mo) in Feb 2016 .  Significant tests/ events 2011 spirometry normal CT 01/2015>Interval slight progression in right apical consolidation with air bronchograms, probably progression of radiation fibrosis. The left apical component is unchanged. - stable chronic lung disease with bronchiectasis,scarring and peribronchial nodularity consistent    02/08/2017 Follow up : Bronchiectais .  Patient presents for a six-month follow-up. She says overall that her breathing is doing about the same. She has a daily cough. She has minimally production of mucus. She does get winded intermittently. She denies any hemoptysis, fever, chest pain, orthopnea, PND, or increased leg swelling. She does have a flutter valve, but admits she saw been using it on a regular basis  Had the Flu with bronchitiis. She was treated with Tamiflu and Zpack .  Got better. Cough and congestion are better.    Allergies  Allergen Reactions  . Losartan     headache  . Adhesive [Tape]     Blisters  . Ciprofloxacin     REACTION: tendinitis  . Clarithromycin     REACTION: severe reflux  . Prednisone     REACTION: irregular heartbeat, not able to sleep    Immunization History  Administered Date(s) Administered  . Influenza,inj,Quad PF,36+ Mos 08/25/2013, 07/29/2014  . Influenza-Unspecified 09/08/2015, 08/09/2016  . Pneumococcal Conjugate-13 11/14/2013  . Pneumococcal Polysaccharide-23 10/11/2006, 11/12/2012  . Td 12/12/2003, 09/08/2015  . Tdap 09/08/2015    Past Medical History:  Diagnosis Date  . [place]   [place], s/o bilat mastectomy with + margin  - also for XRT soon  . Bronchiectasis   . Chronic fatigue fibromyalgia syndrome 11/12/2012  . COPD ([place]ronic obstructive pulmonary disease) ([place]   . DVT, lower extremity (HCC)    LLE  . Fibromyalgia   . GERD (gastroesophageal reflux disease)   . History of shingles   . Hyperlipidemia   . Hypertension   . Impaired glucose tolerance 11/14/2013  . Migraine   . Osteopenia   . Osteoporosis 10/16/2007   Qualifier: Diagnosis of  By: JJenny ReichmannMD, JHunt Oris  . Pulmonary Mycobacterium avium [place] (MAC) infection ([place]   . Squamous cell skin cancer, multiple sites     Tobacco History: History  Smoking Status  . Former Smoker  . Packs/day: 1.00  . Years: 25.00  . Types: Cigarettes  . Quit date: 12/11/1985  Smokeless Tobacco  . Never Used    Comment: pt does not smoke   Counseling given: Not Answered   Outpatient Encounter Prescriptions as of 02/08/2017  Medication Sig  . amLODipine (NORVASC) 5 MG tablet Take 1 tablet (5 mg total) by mouth daily.  .Marland Kitchenaspirin 81 MG EC tablet Take 81 mg by mouth daily.    .Marland Kitchenatorvastatin (LIPITOR) 20 MG tablet Take 1 tablet (20 mg total) by mouth daily. Yearly physical w/labs due in February must see MD for future refills  . calcium citrate-vitamin D (CITRACAL+D) 315-200 MG-UNIT per tablet Take 2 tablets by mouth daily.   . carisoprodol (  SOMA) 350 MG tablet Take 1 tablet (350 mg total) by mouth 2 (two) times daily as needed for muscle spasms.  Marland Kitchen denosumab (PROLIA) 60 MG/ML SOLN injection Inject 60 mg into the skin every 6 (six) months. Administer in upper arm, thigh, or abdomen  . gabapentin (NEURONTIN) 100 MG capsule Take 100 mg by mouth 3 (three) times daily.  Marland Kitchen letrozole (FEMARA) 2.5 MG tablet Take 2.5 mg by mouth daily.  . Methylcellulose, Laxative, (CITRUCEL PO) Take 2 tablets by mouth daily.   Marland Kitchen olmesartan-hydrochlorothiazide (BENICAR HCT) 20-12.5 MG tablet Take 0.5  tablets by mouth daily.  . ranitidine (ZANTAC) 150 MG tablet   . Respiratory Therapy Supplies (FLUTTER) DEVI Use as directed.  . RESTASIS 0.05 % ophthalmic emulsion   . temazepam (RESTORIL) 15 MG capsule TAKE 1 OR 2 CAPSULES BY MOUTH EVERY NIGHT AT BEDTIME AS NEEDED FOR SLEEP   No facility-administered encounter medications on file as of 02/08/2017.      Review of Systems  Constitutional:   No  weight loss, night sweats,  Fevers, chills,  +fatigue, or  lassitude.  HEENT:   No headaches,  Difficulty swallowing,  Tooth/dental problems, or  Sore throat,                No sneezing, itching, ear ache, nasal congestion, post nasal drip,   CV:  No chest pain,  Orthopnea, PND, swelling in lower extremities, anasarca, dizziness, palpitations, syncope.   GI  No heartburn, indigestion, abdominal pain, nausea, vomiting, diarrhea, change in bowel habits, loss of appetite, bloody stools.   Resp:  .  No chest wall deformity  Skin: no rash or lesions.  GU: no dysuria, change in color of urine, no urgency or frequency.  No flank pain, no hematuria   MS:  No joint pain or swelling.  No decreased range of motion.  No back pain.    Physical Exam  BP 112/62 (BP Location: Left Arm, Cuff Size: Normal)   Pulse 86   Ht _0  (1.651 m)   Wt 112 lb 3.2 oz (50.9 kg)   SpO2 99%   BMI 18.67 kg/m   GEN: A/Ox3; pleasant , NAD, thin    HEENT:  [place]/AT,  EACs-clear, TMs-wnl, NOSE-clear, THROAT-clear, no lesions, no postnasal drip or exudate noted.   NECK:  Supple w/ fair ROM; no JVD; normal carotid impulses w/o bruits; no thyromegaly or nodules palpated; no lymphadenopathy.    RESP  Clear  P & A; w/o, wheezes/ rales/ or rhonchi. no accessory muscle use, no dullness to percussion  CARD:  RRR, no m/r/g, no peripheral edema, pulses intact, no cyanosis or clubbing.  GI:   Soft & nt; nml bowel sounds; no organomegaly or masses detected.   Musco: Warm bil, no deformities or joint swelling noted.   Neuro:  alert, no focal deficits noted.    Skin: Warm, no lesions or rashes   Lab Results:  CBC    Component Value Date/Time   WBC 7.4 12/01/2015 1051   RBC 4.44 12/01/2015 1051   HGB 14.1 12/01/2015 1051   HCT 42.8 12/01/2015 1051   PLT 208.0 12/01/2015 1051   MCV 96.6 12/01/2015 1051   MCH 32.8 04/16/2014 1136   MCHC 33.0 12/01/2015 1051   RDW 14.1 12/01/2015 1051   LYMPHSABS 1.0 12/01/2015 1051   MONOABS 0.8 12/01/2015 1051   EOSABS 0.1 12/01/2015 1051   BASOSABS 0.0 12/01/2015 1051    BMET    Component Value Date/Time   NA  142 12/01/2015 1051   K 4.5 12/01/2015 1051   CL 104 12/01/2015 1051   CO2 29 12/01/2015 1051   GLUCOSE 104 (H) 12/01/2015 1051   BUN 17 12/01/2015 1051   CREATININE 0.84 12/01/2015 1051   CALCIUM 10.1 12/01/2015 1051   GFRNONAA 75.40 10/31/2010 1119   GFRAA 93 09/24/2008 1044    BNP No results found for: BNP  ProBNP No results found for: PROBNP  Imaging: Dg Chest 2 View  Result Date: 02/08/2017 CLINICAL DATA:  Chronic shortness of breath and cough, initial encounter EXAM: CHEST  2 VIEW COMPARISON:  01/27/2016 FINDINGS: Cardiac shadow is stable. The lungs are again hyperinflated. Diffuse interstitial changes are again seen stable from the prior exam. No focal infiltrate or sizable effusion is seen. No acute bony abnormality is noted. IMPRESSION: Chronic changes of COPD with interstitial scarring. No acute abnormality noted. Electronically Signed   By: Inez Catalina M.D.   On: 02/08/2017 14:06     Assessment & Plan:   BRONCHIECTASIS Compensated without flare  Check cxr   Plan  Patient Instructions  Chest xray today .  Add Mucinex Twice daily As needed   Restart Flutter valve 2-3 times a day.  Follow up Dr. Elsworth Soho  In 6 months and As needed       Mycobacterium avium-intracellulare infection ([place]) Stable w/out evidence of active flare  Check cxr .      Rexene Edison, NP 02/08/2017

## 2017-02-08 NOTE — Patient Instructions (Addendum)
Chest xray today .  Add Mucinex Twice daily As needed   Restart Flutter valve 2-3 times a day.  Follow up Dr. Elsworth Soho  In 6 months and As needed

## 2017-02-08 NOTE — Assessment & Plan Note (Signed)
Compensated without flare  Check cxr   Plan  Patient Instructions  Chest xray today .  Add Mucinex Twice daily As needed   Restart Flutter valve 2-3 times a day.  Follow up Dr. Elsworth Soho  In 6 months and As needed

## 2017-02-08 NOTE — Assessment & Plan Note (Signed)
Stable w/out evidence of active flare  Check cxr .

## 2017-02-13 NOTE — Progress Notes (Signed)
Reviewed & agree with plan  

## 2017-02-26 DIAGNOSIS — H16223 Keratoconjunctivitis sicca, not specified as Sjogren's, bilateral: Secondary | ICD-10-CM | POA: Diagnosis not present

## 2017-03-19 DIAGNOSIS — R5383 Other fatigue: Secondary | ICD-10-CM | POA: Diagnosis not present

## 2017-03-19 DIAGNOSIS — Z681 Body mass index (BMI) 19 or less, adult: Secondary | ICD-10-CM | POA: Diagnosis not present

## 2017-03-19 DIAGNOSIS — I10 Essential (primary) hypertension: Secondary | ICD-10-CM | POA: Diagnosis not present

## 2017-03-19 DIAGNOSIS — R002 Palpitations: Secondary | ICD-10-CM | POA: Diagnosis not present

## 2017-04-13 DIAGNOSIS — Z681 Body mass index (BMI) 19 or less, adult: Secondary | ICD-10-CM | POA: Diagnosis not present

## 2017-04-13 DIAGNOSIS — R490 Dysphonia: Secondary | ICD-10-CM | POA: Diagnosis not present

## 2017-04-13 DIAGNOSIS — R0789 Other chest pain: Secondary | ICD-10-CM | POA: Diagnosis not present

## 2017-04-13 DIAGNOSIS — Z923 Personal history of irradiation: Secondary | ICD-10-CM | POA: Diagnosis not present

## 2017-04-13 DIAGNOSIS — R5383 Other fatigue: Secondary | ICD-10-CM | POA: Diagnosis not present

## 2017-04-13 DIAGNOSIS — Z79899 Other long term (current) drug therapy: Secondary | ICD-10-CM | POA: Diagnosis not present

## 2017-04-13 DIAGNOSIS — Z08 Encounter for follow-up examination after completed treatment for malignant neoplasm: Secondary | ICD-10-CM | POA: Diagnosis not present

## 2017-04-13 DIAGNOSIS — T426X5A Adverse effect of other antiepileptic and sedative-hypnotic drugs, initial encounter: Secondary | ICD-10-CM | POA: Diagnosis not present

## 2017-04-13 DIAGNOSIS — Z853 Personal history of malignant neoplasm of breast: Secondary | ICD-10-CM | POA: Diagnosis not present

## 2017-04-13 DIAGNOSIS — C50411 Malignant neoplasm of upper-outer quadrant of right female breast: Secondary | ICD-10-CM | POA: Diagnosis not present

## 2017-04-13 DIAGNOSIS — Z170 Estrogen receptor positive status [ER+]: Secondary | ICD-10-CM | POA: Diagnosis not present

## 2017-04-30 ENCOUNTER — Other Ambulatory Visit: Payer: Self-pay | Admitting: Internal Medicine

## 2017-05-01 ENCOUNTER — Ambulatory Visit (INDEPENDENT_AMBULATORY_CARE_PROVIDER_SITE_OTHER): Payer: Medicare Other | Admitting: *Deleted

## 2017-05-01 VITALS — BP 108/58 | HR 75 | Resp 20 | Ht 65.0 in | Wt 112.0 lb

## 2017-05-01 DIAGNOSIS — Z0000 Encounter for general adult medical examination without abnormal findings: Secondary | ICD-10-CM | POA: Diagnosis not present

## 2017-05-01 NOTE — Progress Notes (Signed)
Pre visit review using our clinic review tool, if applicable. No additional management support is needed unless otherwise documented below in the visit note. 

## 2017-05-01 NOTE — Progress Notes (Addendum)
Subjective:   Diane Haney is a 71 y.o. female who presents for Medicare Annual (Subsequent) preventive examination.  Review of Systems:  No ROS.  Medicare Wellness Visit.  Cardiac Risk Factors include: advanced age (>40mn, >>32women);hypertension;dyslipidemia Sleep patterns: no sleep issues, feels rested on waking, gets up 1 times nightly to void and sleeps 6-8 hours nightly.   Home Safety/Smoke Alarms:  Feels safe in home. Smoke alarms in place.   Living environment; residence and Firearm Safety: 2-story house, no firearms. Lives with husband, no DME needed Seat Belt Safety/Bike Helmet: Wears seat belt.   Counseling:   Eye Exam- appointment yearly Dental- appointment every 6 months   Female:   Pap-  N/A     Mammo-  N/A, bilateral mastectomies, followed by oncology    Dexa scan-  Last 04/14/13, has an appointment scheduled for 05/02/17       CCS- Last 10/05/14, recall 10 years     Objective:     Vitals: BP (!) 108/58   Pulse 75   Resp 20   Ht 5' 5"  (1.651 m)   Wt 112 lb (50.8 kg)   SpO2 97%   BMI 18.64 kg/m   Body mass index is 18.64 kg/m.   Tobacco History  Smoking Status  . Former Smoker  . Packs/day: 1.00  . Years: 25.00  . Types: Cigarettes  . Quit date: 12/11/1985  Smokeless Tobacco  . Never Used    Comment: pt does not smoke     Counseling given: Not Answered   Past Medical History:  Diagnosis Date  . [place] ([place] 11/12[place]2012   [place], s/o bilat mastectomy with + margin  - also for XRT soon  . Bronchiectasis   . Chronic fatigue fibromyalgia syndrome 11/12/2012  . COPD (chronic obstructive pulmonary disease) ([place]   . DVT, lower extremity (HCC)    LLE  . Fibromyalgia   . GERD (gastroesophageal reflux disease)   . History of shingles   . Hyperlipidemia   . Hypertension   . Impaired glucose tolerance 11/14/2013  . Migraine   . Osteopenia   . Osteoporosis 10/16/2007   Qualifier: Diagnosis of  By: JJenny ReichmannMD, JHunt Oris  . Pulmonary Mycobacterium avium complex (MAC) infection ([place]   . Squamous cell skin cancer, multiple sites    Past Surgical History:  Procedure Laterality Date  . FOOT SURGERY    . mastectomy bilateral oct 2013    . shoulder impingement    . VIDEO BRONCHOSCOPY N/A 05/07/2014   Procedure: VIDEO BRONCHOSCOPY WITHOUT FLUORO;  Surgeon: PElsie Stain MD;  Location: [facility];  Service: Cardiopulmonary;  Laterality: N/A;   Family History  Problem Relation Age of Onset  . Lung cancer Father   . Colon polyps Father   . Heart disease Other        grandparents  . Breast cancer Other   . Hyperlipidemia Other   . Hypertension Other   . Other Other        alcholism / addiction   History  Sexual Activity  . Sexual activity: Not on file    Outpatient Encounter Prescriptions as of 05/01/2017  Medication Sig  . amLODipine (NORVASC) 5 MG tablet Take 1 tablet (5 mg total) by mouth daily.  .Marland Kitchenaspirin 81 MG EC tablet Take 81 mg by mouth daily.    .Marland Kitchenatorvastatin (LIPITOR) 20 MG tablet TAKE ONE TABLET BY MOUTH DAILY  .  calcium citrate-vitamin D (CITRACAL+D) 315-200 MG-UNIT per tablet Take 2 tablets by mouth daily.   . carisoprodol (SOMA) 350 MG tablet Take 1 tablet (350 mg total) by mouth 2 (two) times daily as needed for muscle spasms.  Marland Kitchen denosumab (PROLIA) 60 MG/ML SOLN injection Inject 60 mg into the skin every 6 (six) months. Administer in upper arm, thigh, or abdomen  . gabapentin (NEURONTIN) 100 MG capsule Take 100 mg by mouth 3 (three) times daily.  Marland Kitchen letrozole (FEMARA) 2.5 MG tablet Take 2.5 mg by mouth daily.  . Methylcellulose, Laxative, (CITRUCEL PO) Take 2 tablets by mouth daily.   Marland Kitchen olmesartan-hydrochlorothiazide (BENICAR HCT) 20-12.5 MG tablet Take 0.5 tablets by mouth daily.  . ranitidine (ZANTAC) 150 MG tablet   . Respiratory Therapy Supplies (FLUTTER) DEVI Use as directed.  . RESTASIS 0.05 % ophthalmic emulsion   . temazepam  (RESTORIL) 15 MG capsule TAKE 1 OR 2 CAPSULES BY MOUTH EVERY NIGHT AT BEDTIME AS NEEDED FOR SLEEP   No facility-administered encounter medications on file as of 05/01/2017.     Activities of Daily Living In your present state of health, do you have any difficulty performing the following activities: 05/01/2017  Hearing? N  Vision? N  Difficulty concentrating or making decisions? N  Walking or climbing stairs? N  Dressing or bathing? N  Doing errands, shopping? N  Preparing Food and eating ? N  Using the Toilet? N  In the past six months, have you accidently leaked urine? N  Do you have problems with loss of bowel control? N  Managing your Medications? N  Managing your Finances? N  Housekeeping or managing your Housekeeping? N  Some recent data might be hidden    Patient Care Team: Biagio Borg, MD as PCP - General (Internal Medicine) Elsie Stain, MD (Pulmonary Disease) Prince Solian, MD as Consulting Physician (Internal Medicine) Denton Brick., MD (Cardiology) Christian Mate., MD as Referring Physician (Hematology and Oncology)    Assessment:    Physical assessment deferred to PCP.  Exercise Activities and Dietary recommendations Current Exercise Habits: Home exercise routine, Type of exercise: stretching;strength training/weights;walking, Time (Minutes): 25, Frequency (Times/Week): 4, Weekly Exercise (Minutes/Week): 100, Intensity: Mild  Diet (meal preparation, eat out, water intake, caffeinated beverages, dairy products, fruits and vegetables): in general, a "healthy" diet  , well balanced, low salt eats a variety of fruits and vegetables daily, limits salt, fat/cholesterol, sugar, caffeine, drinks 6-8 glasses of water daily.   Goals      Patient Stated   . patient (pt-stated)          To continue to exercise and remain mobile      Other   . Maintain current health status          Continue to be active and as healthy as possible.       Fall  Risk Fall Risk  05/01/2017 12/06/2016 11/16/2016 12/01/2015 12/01/2015  Falls in the past year? No No No No No   Depression Screen PHQ 2/9 Scores 05/01/2017 12/06/2016 12/01/2015 12/01/2015  PHQ - 2 Score 0 0 0 0     Cognitive Function MMSE - Mini Mental State Exam 05/01/2017  Not completed: Refused        Immunization History  Administered Date(s) Administered  . Influenza,inj,Quad PF,36+ Mos 08/25/2013, 07/29/2014  . Influenza-Unspecified 09/08/2015, 08/09/2016  . Pneumococcal Conjugate-13 11/14/2013  . Pneumococcal Polysaccharide-23 10/11/2006, 11/12/2012  . Td 12/12/2003, 09/08/2015  . Tdap 09/08/2015   Screening Tests Health  Maintenance  Topic Date Due  . MAMMOGRAM  12/10/2013  . INFLUENZA VACCINE  07/11/2017  . COLONOSCOPY  10/05/2024  . TETANUS/TDAP  09/07/2025  . DEXA SCAN  Completed  . Hepatitis C Screening  Completed  . PNA vac Low Risk Adult  Completed      Plan:     Continue to eat heart healthy diet (full of fruits, vegetables, whole grains, lean protein, water--limit salt, fat, and sugar intake) and increase physical activity as tolerated.  Continue doing brain stimulating activities (puzzles, reading, adult coloring books, staying active) to keep memory sharp.    I have personally reviewed and noted the following in the patient's chart:   . Medical and social history . Use of alcohol, tobacco or illicit drugs  . Current medications and supplements . Functional ability and status . Nutritional status . Physical activity . Advanced directives . List of other physicians . Vitals . Screenings to include cognitive, depression, and falls . Referrals and appointments  In addition, I have reviewed and discussed with patient certain preventive protocols, quality metrics, and best practice recommendations. A written personalized care plan for preventive services as well as general preventive health recommendations were provided to patient.     Michiel Cowboy, RN  05/01/2017  Medical screening examination/treatment/procedure(s) were performed by non-physician practitioner and as supervising physician I was immediately available for consultation/collaboration. I agree with above. Cathlean Cower, MD

## 2017-05-01 NOTE — Patient Instructions (Signed)
Continue to eat heart healthy diet (full of fruits, vegetables, whole grains, lean protein, water--limit salt, fat, and sugar intake) and increase physical activity as tolerated.  Continue doing brain stimulating activities (puzzles, reading, adult coloring books, staying active) to keep memory sharp.    Diane Haney , Thank you for taking time to come for your Medicare Wellness Visit. I appreciate your ongoing commitment to your health goals. Please review the following plan we discussed and let me know if I can assist you in the future.   These are the goals we discussed: Goals      Patient Stated   . patient (pt-stated)          To continue to exercise and remain mobile      Other   . Maintain current health status          Continue to be active and as healthy as possible.        This is a list of the screening recommended for you and due dates:  Health Maintenance  Topic Date Due  . Mammogram  12/10/2013  . Flu Shot  07/11/2017  . Colon Cancer Screening  10/05/2024  . Tetanus Vaccine  09/07/2025  . DEXA scan (bone density measurement)  Completed  .  Hepatitis C: One time screening is recommended by Center for Disease Control  (CDC) for  adults born from [birth year] through 1966.   Completed  . Pneumonia vaccines  Completed

## 2017-05-02 DIAGNOSIS — M85852 Other specified disorders of bone density and structure, left thigh: Secondary | ICD-10-CM | POA: Diagnosis not present

## 2017-05-02 DIAGNOSIS — Z780 Asymptomatic menopausal state: Secondary | ICD-10-CM | POA: Diagnosis not present

## 2017-05-10 DIAGNOSIS — Z85828 Personal history of other malignant neoplasm of skin: Secondary | ICD-10-CM | POA: Diagnosis not present

## 2017-05-10 DIAGNOSIS — L814 Other melanin hyperpigmentation: Secondary | ICD-10-CM | POA: Diagnosis not present

## 2017-05-10 DIAGNOSIS — L720 Epidermal cyst: Secondary | ICD-10-CM | POA: Diagnosis not present

## 2017-05-10 DIAGNOSIS — Z08 Encounter for follow-up examination after completed treatment for malignant neoplasm: Secondary | ICD-10-CM | POA: Diagnosis not present

## 2017-05-10 DIAGNOSIS — L570 Actinic keratosis: Secondary | ICD-10-CM | POA: Diagnosis not present

## 2017-05-21 DIAGNOSIS — C50411 Malignant neoplasm of upper-outer quadrant of right female breast: Secondary | ICD-10-CM | POA: Diagnosis not present

## 2017-05-21 DIAGNOSIS — Z08 Encounter for follow-up examination after completed treatment for malignant neoplasm: Secondary | ICD-10-CM | POA: Diagnosis not present

## 2017-05-21 DIAGNOSIS — M810 Age-related osteoporosis without current pathological fracture: Secondary | ICD-10-CM | POA: Diagnosis not present

## 2017-05-21 DIAGNOSIS — Z7982 Long term (current) use of aspirin: Secondary | ICD-10-CM | POA: Diagnosis not present

## 2017-05-21 DIAGNOSIS — Z9013 Acquired absence of bilateral breasts and nipples: Secondary | ICD-10-CM | POA: Diagnosis not present

## 2017-05-21 DIAGNOSIS — Z681 Body mass index (BMI) 19 or less, adult: Secondary | ICD-10-CM | POA: Diagnosis not present

## 2017-05-21 DIAGNOSIS — Z79811 Long term (current) use of aromatase inhibitors: Secondary | ICD-10-CM | POA: Diagnosis not present

## 2017-05-21 DIAGNOSIS — Z86718 Personal history of other venous thrombosis and embolism: Secondary | ICD-10-CM | POA: Diagnosis not present

## 2017-05-21 DIAGNOSIS — R6889 Other general symptoms and signs: Secondary | ICD-10-CM | POA: Diagnosis not present

## 2017-05-21 DIAGNOSIS — C50919 Malignant neoplasm of unspecified site of unspecified female breast: Secondary | ICD-10-CM | POA: Diagnosis not present

## 2017-05-21 DIAGNOSIS — F458 Other somatoform disorders: Secondary | ICD-10-CM | POA: Diagnosis not present

## 2017-05-21 DIAGNOSIS — Z170 Estrogen receptor positive status [ER+]: Secondary | ICD-10-CM | POA: Diagnosis not present

## 2017-05-21 DIAGNOSIS — Z853 Personal history of malignant neoplasm of breast: Secondary | ICD-10-CM | POA: Diagnosis not present

## 2017-05-21 DIAGNOSIS — Z5181 Encounter for therapeutic drug level monitoring: Secondary | ICD-10-CM | POA: Diagnosis not present

## 2017-05-21 DIAGNOSIS — M8580 Other specified disorders of bone density and structure, unspecified site: Secondary | ICD-10-CM | POA: Diagnosis not present

## 2017-05-21 DIAGNOSIS — Z79899 Other long term (current) drug therapy: Secondary | ICD-10-CM | POA: Diagnosis not present

## 2017-05-21 DIAGNOSIS — R5383 Other fatigue: Secondary | ICD-10-CM | POA: Diagnosis not present

## 2017-05-21 DIAGNOSIS — Z803 Family history of malignant neoplasm of breast: Secondary | ICD-10-CM | POA: Diagnosis not present

## 2017-05-31 DIAGNOSIS — M810 Age-related osteoporosis without current pathological fracture: Secondary | ICD-10-CM | POA: Diagnosis not present

## 2017-07-11 ENCOUNTER — Encounter: Payer: Self-pay | Admitting: Pulmonary Disease

## 2017-07-11 ENCOUNTER — Ambulatory Visit (INDEPENDENT_AMBULATORY_CARE_PROVIDER_SITE_OTHER): Payer: Medicare Other | Admitting: Pulmonary Disease

## 2017-07-11 ENCOUNTER — Telehealth: Payer: Self-pay

## 2017-07-11 ENCOUNTER — Telehealth: Payer: Self-pay | Admitting: Pulmonary Disease

## 2017-07-11 ENCOUNTER — Ambulatory Visit (INDEPENDENT_AMBULATORY_CARE_PROVIDER_SITE_OTHER)
Admission: RE | Admit: 2017-07-11 | Discharge: 2017-07-11 | Disposition: A | Discharge status: Home / Self Care | Payer: Medicare Other | Source: Ambulatory Visit | Attending: Pulmonary Disease | Admitting: Pulmonary Disease

## 2017-07-11 VITALS — BP 104/64 | HR 86 | Ht 65.5 in | Wt 113.0 lb

## 2017-07-11 DIAGNOSIS — J479 Bronchiectasis, uncomplicated: Secondary | ICD-10-CM

## 2017-07-11 DIAGNOSIS — R05 Cough: Secondary | ICD-10-CM

## 2017-07-11 DIAGNOSIS — R053 Chronic cough: Secondary | ICD-10-CM

## 2017-07-11 DIAGNOSIS — J449 Chronic obstructive pulmonary disease, unspecified: Secondary | ICD-10-CM

## 2017-07-11 DIAGNOSIS — R042 Hemoptysis: Secondary | ICD-10-CM

## 2017-07-11 MED ORDER — AZITHROMYCIN 250 MG PO TABS: 250.0000 mg | ORAL_TABLET | Freq: Every day | ORAL | 0 refills | Status: DC

## 2017-07-11 NOTE — Telephone Encounter (Signed)
Spoke with pt, aware of results/recs.  Nothing further needed.  

## 2017-07-11 NOTE — Patient Instructions (Signed)
1.  Complete Azithromycin as prescribed 2.  Eat yogurt while on antibiotics 3.  Monitor your cough / sputum production if any.  Please let us know if you cough up more blood / increase in volume or quantity 4.  Follow up with Dr. Elsworth Soho as planned 5.  Report to ER immediately if new or worsening symptoms / concerns

## 2017-07-11 NOTE — Telephone Encounter (Signed)
-----   Message from Donita Brooks, NP sent at 07/11/2017  2:33 PM EDT ----- Regarding: Follow up CXR Please call the [name] and let her know that her chest xray does not show pneumonia.  Likely her coughing up blood was related to her coughing spells.  Continue Zpak as planned.  Call if new or worsening symptoms.

## 2017-07-11 NOTE — Progress Notes (Signed)
[facility]   Chief Complaint  Patient presents with  . Acute Visit    Pt stated that she started coughing last night. States that usually she cannot get any mucus up but this time she did have bloody mucus. Pt has not been able to get up any more mucus since last night but cough is more productive. Pt also c/o SOB. Denies any CP.     Primary Pulmonologist: Dr. Elsworth Soho, follows at [facility]  Current Outpatient Prescriptions on File Prior to Visit  Medication Sig  . amLODipine (NORVASC) 5 MG tablet Take 1 tablet (5 mg total) by mouth daily.  Marland Kitchen aspirin 81 MG EC tablet Take 81 mg by mouth daily.    Marland Kitchen atorvastatin (LIPITOR) 20 MG tablet TAKE ONE TABLET BY MOUTH DAILY  . calcium citrate-vitamin D (CITRACAL+D) 315-200 MG-UNIT per tablet Take 2 tablets by mouth daily.   . carisoprodol (SOMA) 350 MG tablet Take 1 tablet (350 mg total) by mouth 2 (two) times daily as needed for muscle spasms.  Marland Kitchen denosumab (PROLIA) 60 MG/ML SOLN injection Inject 60 mg into the skin every 6 (six) months. Administer in upper arm, thigh, or abdomen  . gabapentin (NEURONTIN) 100 MG capsule Take 100 mg by mouth 3 (three) times daily.  Marland Kitchen letrozole (FEMARA) 2.5 MG tablet Take 2.5 mg by mouth daily.  . Methylcellulose, Laxative, (CITRUCEL PO) Take 2 tablets by mouth daily.   Marland Kitchen olmesartan-hydrochlorothiazide (BENICAR HCT) 20-12.5 MG tablet Take 0.5 tablets by mouth daily.  . ranitidine (ZANTAC) 150 MG tablet   . Respiratory Therapy Supplies (FLUTTER) DEVI Use as directed.  . RESTASIS 0.05 % ophthalmic emulsion   . temazepam (RESTORIL) 15 MG capsule TAKE 1 OR 2 CAPSULES BY MOUTH EVERY NIGHT AT BEDTIME AS NEEDED FOR SLEEP   No current facility-administered medications on file prior to visit.      Studies: 02/08/17  CXR >> images personally reviewed, changes consistent with emphysema, no acute process  Past Medical Hx:  has a past medical history of Breast cancer ([place]) (11/12/2012); Bronchiectasis; Chronic fatigue  fibromyalgia syndrome (11/12/2012); COPD (chronic obstructive pulmonary disease) ([place]); DVT, lower extremity ([place]); Fibromyalgia; GERD (gastroesophageal reflux disease); History of shingles; Hyperlipidemia; Hypertension; Impaired glucose tolerance (11/14/2013); Migraine; Osteopenia; Osteoporosis (10/16/2007); Pulmonary Mycobacterium avium complex (MAC) infection ([place]); and Squamous cell skin cancer, multiple sites.   Past Surgical hx, Allergies, Family hx, Social hx all reviewed.  Vital Signs BP 104/64 (BP Location: Left Arm, Patient Position: Sitting, Cuff Size: Normal)   Pulse 86   Ht 5' 5.5" (1.664 m)   Wt 113 lb (51.3 kg)   SpO2 98%   BMI 18.52 kg/m   History of Present Illness Diane Haney is a 71 y.o. female, former smoker,  with a history of MAI and bronchiectasis who presented to the pulmonary office for hemoptysis.    Pt reports she was attempting to go to sleep on evening prior to office visit, she went to bed and started coughing. The cough sounded really loose > went to the kitchen and tried to cough it up and had 2-3 times of bright red bloody secretions.  Approximately 1 tablespoon in volume total.  She states she is chronically hoarse / voice is a little weaker today.  Weight has been stable.  Reports chills during the day.  Denies night sweats / fevers.  Not on inhalers.  Uses flutter valve daily.  States she usually has a hard time coughing secretions up > can get to throat  but cant clear.  In the past, she has had to have bronchoscopy due to inability to produce secretions.   Reports she had a cold last month > has not felt normal since.  This past week has been more of a cough.  Cough sounds moist/wet but is occasionally dry.    Hx stage I moderately differentiated invasive ductal carcinoma of the right breast. Idiopathic LLE DVT (Dx at age 7)   Physical Exam  General - well developed adult F in no acute distress ENT - No sinus tenderness, no oral exudate, no LAN Cardiac  - s1s2 regular, no murmur Chest - even/non-labored, lungs bilaterally clear. No wheeze/rales Back - No focal tenderness Abd - Soft, non-tender Ext - No edema Neuro - Normal strength Skin - No rashes Psych - normal mood, and behavior   Assessment/Plan  Discussion: 71 y/o F with hx of MAI and bronchiectasis who had one episode of hemoptysis.  No other associated symptoms.  Concern for potential infection / bronchiectasis flare.    Hemoptysis   Plan: Assess CXR to ensure no new PNA or evidence of active MAI If further hemoptysis, may need CT chest to evaluate parenchyma  Zpak as instructed  Only one episode > will hold off for now on cough suppression Call for new or worsening symptoms or report to ER Follow up with Dr. Elsworth Soho 9/13 for review of symptoms  Patient Instructions  1.  Complete Azithromycin as prescribed 2.  Eat yogurt while on antibiotics 3.  Monitor your cough / sputum production if any.  Please let us know if you cough up more blood / increase in volume or quantity 4.  Follow up with Dr. Elsworth Soho as planned 5.  Report to ER immediately if new or worsening symptoms / concerns   Noe Gens, NP-[facility] [place]  [phone] 07/11/2017, 12:21 PM

## 2017-07-11 NOTE — Telephone Encounter (Signed)
Called and spoke to pt. Pt c/o increase in prod cough and c/o hemoptysis. Appt made with Velna Hatchet, NP, today 07/11/17. Pt verbalized understanding and denied any further questions or concerns at this time.

## 2017-08-21 DIAGNOSIS — Z23 Encounter for immunization: Secondary | ICD-10-CM | POA: Diagnosis not present

## 2017-08-23 ENCOUNTER — Ambulatory Visit (INDEPENDENT_AMBULATORY_CARE_PROVIDER_SITE_OTHER): Payer: Medicare Other | Admitting: Pulmonary Disease

## 2017-08-23 ENCOUNTER — Encounter: Payer: Self-pay | Admitting: Pulmonary Disease

## 2017-08-23 VITALS — BP 129/73 | HR 56 | Ht 65.5 in | Wt 111.0 lb

## 2017-08-23 DIAGNOSIS — J471 Bronchiectasis with (acute) exacerbation: Secondary | ICD-10-CM

## 2017-08-23 DIAGNOSIS — J209 Acute bronchitis, unspecified: Secondary | ICD-10-CM

## 2017-08-23 DIAGNOSIS — J479 Bronchiectasis, uncomplicated: Secondary | ICD-10-CM | POA: Diagnosis not present

## 2017-08-23 NOTE — Assessment & Plan Note (Signed)
We will reassess the degree of bronchiectasis by high resolution CT scan and repeating spirometry pre-and post. If there is worsening then we will consider adding  bronchodilator therapy for in the past she has developed palpitations and did not tolerate this

## 2017-08-23 NOTE — Assessment & Plan Note (Signed)
Recent episode of hemoptysis seems to have been related to acute bronchitis which is now resolved. Does not need further workup at this time unless this recurs

## 2017-08-23 NOTE — Progress Notes (Signed)
   Subjective:    Patient ID: Diane Haney, female    DOB: 09/18/1946, 71 y.o.   MRN: [id number]  HPI   38 y o. female with bronchiectasis and MAI   Was seen by ID Johnnye Sima) , for recurrent MAI with previous tx of MAI triple therapy rx.  Finished last course (60mon) in Feb 2016 .  Hx stage I moderately differentiated invasive ductal carcinoma of the right breast.  Idiopathic LLE DVT (Dx at age 55)  Chief Complaint  Patient presents with  . Follow-up    6 month follow. States increased SOB and coughing. Saw BO on 8/1 due to coughing blood. Has not seen any blood since then.    She developed blood-tinged sputum in 07/2017 was given azithromycin. This did not occur. Chest x-ray was clear except for hyperinflation which has been noted on prior films. She reports mild shortness of breath but denies wheezing, pedal edema or orthopnea I reviewed her recent chest x-ray and prior CT scan  Has tried spiriva and symbicort but did not like them because of side effect of palpitation./heart racing.    Significant tests/ events 2011 spirometry normal CT 01/2015>Interval slight progression in right apical consolidation with air bronchograms, probably progression of radiation fibrosis. The left apical component is unchanged. - stable chronic lung disease with bronchiectasis,scarring and peribronchial nodularity consistent     Review of Systems neg for any significant sore throat, dysphagia, itching, sneezing, nasal congestion or excess/ purulent secretions, fever, chills, sweats, unintended wt loss, pleuritic or exertional cp, hempoptysis, orthopnea pnd or change in chronic leg swelling. Also denies presyncope, palpitations, heartburn, abdominal pain, nausea, vomiting, diarrhea or change in bowel or urinary habits, dysuria,hematuria, rash, arthralgias, visual complaints, headache, numbness weakness or ataxia.     Objective:   Physical Exam  Gen. Pleasant, thin, in no distress ENT - no  thrush, no post nasal drip Neck: No JVD, no thyromegaly, no carotid bruits Lungs: no use of accessory muscles, no dullness to percussion, decreased without rales or rhonchi  Cardiovascular: Rhythm regular, heart sounds  normal, no murmurs or gallops, no peripheral edema Musculoskeletal: No deformities, no cyanosis or clubbing        Assessment & Plan:

## 2017-08-23 NOTE — Patient Instructions (Signed)
High resolution CT scan Schedule spirometry pre-post

## 2017-08-30 ENCOUNTER — Ambulatory Visit (HOSPITAL_BASED_OUTPATIENT_CLINIC_OR_DEPARTMENT_OTHER)
Admission: RE | Admit: 2017-08-30 | Discharge: 2017-08-30 | Disposition: A | Discharge status: Home / Self Care | Payer: Medicare Other | Source: Ambulatory Visit | Attending: Pulmonary Disease | Admitting: Pulmonary Disease

## 2017-08-30 DIAGNOSIS — Z9013 Acquired absence of bilateral breasts and nipples: Secondary | ICD-10-CM | POA: Diagnosis not present

## 2017-08-30 DIAGNOSIS — M47814 Spondylosis without myelopathy or radiculopathy, thoracic region: Secondary | ICD-10-CM | POA: Insufficient documentation

## 2017-08-30 DIAGNOSIS — I2510 Atherosclerotic heart disease of native coronary artery without angina pectoris: Secondary | ICD-10-CM | POA: Insufficient documentation

## 2017-08-30 DIAGNOSIS — I700 Atherosclerosis of aorta: Secondary | ICD-10-CM | POA: Diagnosis not present

## 2017-08-30 DIAGNOSIS — J8410 Pulmonary fibrosis, unspecified: Secondary | ICD-10-CM | POA: Insufficient documentation

## 2017-08-30 DIAGNOSIS — Z853 Personal history of malignant neoplasm of breast: Secondary | ICD-10-CM | POA: Insufficient documentation

## 2017-08-30 DIAGNOSIS — J479 Bronchiectasis, uncomplicated: Secondary | ICD-10-CM | POA: Insufficient documentation

## 2017-08-30 DIAGNOSIS — K7689 Other specified diseases of liver: Secondary | ICD-10-CM | POA: Diagnosis not present

## 2017-08-31 DIAGNOSIS — H43813 Vitreous degeneration, bilateral: Secondary | ICD-10-CM | POA: Diagnosis not present

## 2017-08-31 DIAGNOSIS — H26491 Other secondary cataract, right eye: Secondary | ICD-10-CM | POA: Insufficient documentation

## 2017-08-31 DIAGNOSIS — H5203 Hypermetropia, bilateral: Secondary | ICD-10-CM | POA: Diagnosis not present

## 2017-08-31 DIAGNOSIS — H02834 Dermatochalasis of left upper eyelid: Secondary | ICD-10-CM | POA: Diagnosis not present

## 2017-08-31 DIAGNOSIS — H02831 Dermatochalasis of right upper eyelid: Secondary | ICD-10-CM | POA: Insufficient documentation

## 2017-08-31 DIAGNOSIS — Z961 Presence of intraocular lens: Secondary | ICD-10-CM | POA: Insufficient documentation

## 2017-08-31 DIAGNOSIS — H16223 Keratoconjunctivitis sicca, not specified as Sjogren's, bilateral: Secondary | ICD-10-CM | POA: Diagnosis not present

## 2017-08-31 NOTE — Progress Notes (Signed)
Spoke with pt and notified of results per Dr. Alva. Pt verbalized understanding and denied any questions. 

## 2017-09-20 ENCOUNTER — Ambulatory Visit (INDEPENDENT_AMBULATORY_CARE_PROVIDER_SITE_OTHER): Payer: Medicare Other | Admitting: Pulmonary Disease

## 2017-09-20 DIAGNOSIS — J479 Bronchiectasis, uncomplicated: Secondary | ICD-10-CM

## 2017-09-20 LAB — PULMONARY FUNCTION TEST
FEF 25-75 PRE: 1.9 L/s
FEF 25-75 Post: 2.08 L/sec
FEF2575-%CHANGE-POST: 9 %
FEF2575-%PRED-POST: 111 %
FEF2575-%Pred-Pre: 102 %
FEV1-%CHANGE-POST: 0 %
FEV1-%PRED-POST: 94 %
FEV1-%Pred-Pre: 93 %
FEV1-Post: 2.17 L
FEV1-Pre: 2.16 L
FEV1FVC-%CHANGE-POST: -4 %
FEV1FVC-%PRED-PRE: 104 %
FEV6-%Change-Post: 5 %
FEV6-%PRED-POST: 98 %
FEV6-%Pred-Pre: 93 %
FEV6-Post: 2.86 L
FEV6-Pre: 2.71 L
FEV6FVC-%Pred-Post: 105 %
FEV6FVC-%Pred-Pre: 105 %
FVC-%CHANGE-POST: 5 %
FVC-%Pred-Post: 93 %
FVC-%Pred-Pre: 88 %
FVC-POST: 2.86 L
FVC-PRE: 2.71 L
POST FEV1/FVC RATIO: 76 %
PRE FEV6/FVC RATIO: 100 %
Post FEV6/FVC ratio: 100 %
Pre FEV1/FVC ratio: 80 %

## 2017-09-20 NOTE — Progress Notes (Signed)
PFT completed today 09/20/17.  

## 2017-09-27 ENCOUNTER — Ambulatory Visit (INDEPENDENT_AMBULATORY_CARE_PROVIDER_SITE_OTHER): Payer: Medicare Other | Admitting: Adult Health

## 2017-09-27 ENCOUNTER — Encounter: Payer: Self-pay | Admitting: Adult Health

## 2017-09-27 DIAGNOSIS — J479 Bronchiectasis, uncomplicated: Secondary | ICD-10-CM

## 2017-09-27 DIAGNOSIS — A310 Pulmonary mycobacterial infection: Secondary | ICD-10-CM | POA: Diagnosis not present

## 2017-09-27 NOTE — Assessment & Plan Note (Signed)
Recent flare -improved . CT chest shows progressive changes with hx of recurrent MAI. No active sx currently  If flares again , try to get sputum cx and sputum AFB.  Continue on flutter valve. Add VEST therapy to help with pulmonary hygiene   Plan  Patient Instructions  Add VEST therapy .  Mucinex Twice daily As needed   Continue on Flutter valve Three times a day  .  Follow up Dr. Elsworth Soho  In 3-4 months and As needed

## 2017-09-27 NOTE — Patient Instructions (Signed)
Add VEST therapy .  Mucinex Twice daily As needed   Continue on Flutter valve Three times a day  .  Follow up Dr. Elsworth Soho  In 3-4 months and As needed

## 2017-09-27 NOTE — Assessment & Plan Note (Signed)
Hx of recurrent AFB with previous tx .  CT chest shows progressive changes c/w with MAI .  Cont to follow .

## 2017-09-27 NOTE — Progress Notes (Signed)
_0  ID: Diane Haney, female    DOB: [birth year], 71 y.o.   MRN: [id number]  Chief Complaint  Patient presents with  . Follow-up    Bronchiectasis     Referring provider: Biagio Borg, MD  HPI: 71 yo female with bronchiectasis and MAI   Was seen by ID Johnnye Sima), for recurrent MAI with previous tx of MAI triple therapy rx.  Finished last course (13mo) in Feb 2016 .  Hx stage I moderately differentiated invasive ductal carcinoma of the right breast.  Idiopathic LLE DVT (Dx at age 89  Significant tests/ events 2011 spirometry normal CT 01/2015>Interval slight progression in right apical consolidation with air bronchograms, probably progression of radiation fibrosis. The left apical component is unchanged. - stable chronic lung disease with bronchiectasis,scarring and peribronchial nodularity consistent  HRCT Chest 08/2017 >Sharply marginated consolidation with associated volume loss and bronchiectasis at the right greater than left lung apices and in the anterior right upper lobe, unchanged, compatible with radiation fibrosis. Patchy mild-to-moderate cylindrical and varicoid bronchiectasis in the mid to lower lungs bilaterally, most prominent in the right middle lobe, worsened since 01/18/2015. Bandlike consolidation with associated volume loss in the dependent inferior segment lingula and in the medial segment right middle lobe, worsened, compatible with postinfectious scarring. Moderate patchy tree-in-bud opacities throughout the mid to lower lungs bilaterally, significantly increased particularly in the lower lobes. New patchy and confluent subsolid nodules in the right lower lobe, for example a 7 mm posterior right lower lobe subsolid nodule (series 5/image 803).   09/27/2017 Follow up : Bronchiectasis  Pt returns for 1 month follow up for bronchiectasis and recurrent MAI. Had bronchiectasis flare with mild blood tinged mucus 2 months ago , tx w/ abx. Sx improved with  resolution of hemooptysis .  She was set up for HRCT chest last month that showed progression of bronchiectasis RML . And chronic infectious bronchiolitis due to MAI R>L lower lobes since 2016. PFT done last week was stable with FEV1 94% , ratio 76 , FVC 94%.  Currently she doing okay without flare of symptoms but has daily cough that is not productive.  Uses fluter valve 3 times a day. Hard to get up mucus .  Denies fever, chest pain , orthopnea. Weight is stable .         Allergies  Allergen Reactions  . Losartan     headache  . Adhesive [Tape]     Blisters  . Ciprofloxacin     REACTION: tendinitis  . Clarithromycin     REACTION: severe reflux  . Prednisone     REACTION: irregular heartbeat, not able to sleep    Immunization History  Administered Date(s) Administered  . Influenza,inj,Quad PF,6+ Mos 08/25/2013, 07/29/2014  . Influenza-Unspecified 09/08/2015, 08/09/2016, 08/22/2017  . Pneumococcal Conjugate-13 11/14/2013  . Pneumococcal Polysaccharide-23 10/11/2006, 11/12/2012  . Td 12/12/2003, 09/08/2015  . Tdap 09/08/2015    Past Medical History:  Diagnosis Date  . Breast cancer ([facility] 11/12/2012   Dx oct 2013 - [facility]- Invasive ductal carcinoma, s/o bilat mastectomy with + margin  - also for XRT soon  . Bronchiectasis   . Chronic fatigue fibromyalgia syndrome 11/12/2012  . COPD (chronic obstructive pulmonary disease) ([facility]   . DVT, lower extremity ([facility])    LLE  . Fibromyalgia   . GERD (gastroesophageal reflux disease)   . History of shingles   . Hyperlipidemia   . Hypertension   . Impaired glucose tolerance 11/14/2013  .  Migraine   . Osteopenia   . Osteoporosis 10/16/2007   Qualifier: Diagnosis of  By: Jenny Reichmann MD, [place] Oris   . Pulmonary Mycobacterium avium complex (MAC) infection ([place])   . Squamous cell skin cancer, multiple sites     Tobacco History: History  Smoking Status  . Former Smoker  . Packs/day: 1.00  . Years: 25.00  . Types:  Cigarettes  . Quit date: 12/11/1985  Smokeless Tobacco  . Never Used    Comment: pt does not smoke   Counseling given: Not Answered   Outpatient Encounter Prescriptions as of 09/27/2017  Medication Sig  . amLODipine (NORVASC) 5 MG tablet Take 1 tablet (5 mg total) by mouth daily.  Marland Kitchen aspirin 81 MG EC tablet Take 81 mg by mouth daily.    Marland Kitchen atorvastatin (LIPITOR) 20 MG tablet TAKE ONE TABLET BY MOUTH DAILY  . calcium citrate-vitamin D (CITRACAL+D) 315-200 MG-UNIT per tablet Take 2 tablets by mouth daily.   . carisoprodol (SOMA) 350 MG tablet Take 1 tablet (350 mg total) by mouth 2 (two) times daily as needed for muscle spasms.  Marland Kitchen denosumab (PROLIA) 60 MG/ML SOLN injection Inject 60 mg into the skin every 6 (six) months. Administer in upper arm, thigh, or abdomen  . gabapentin (NEURONTIN) 100 MG capsule Take 100 mg by mouth 3 (three) times daily.  Marland Kitchen letrozole (FEMARA) 2.5 MG tablet Take 2.5 mg by mouth daily.  . Methylcellulose, Laxative, (CITRUCEL PO) Take 2 tablets by mouth daily.   Marland Kitchen olmesartan-hydrochlorothiazide (BENICAR HCT) 20-12.5 MG tablet Take 0.5 tablets by mouth daily.  . ranitidine (ZANTAC) 150 MG tablet   . Respiratory Therapy Supplies (FLUTTER) DEVI Use as directed.  . RESTASIS 0.05 % ophthalmic emulsion   . temazepam (RESTORIL) 15 MG capsule TAKE 1 OR 2 CAPSULES BY MOUTH EVERY NIGHT AT BEDTIME AS NEEDED FOR SLEEP   No facility-administered encounter medications on file as of 09/27/2017.      Review of Systems  Constitutional:   No  weight loss, night sweats,  Fevers, chills, fatigue, or  lassitude.  HEENT:   No headaches,  Difficulty swallowing,  Tooth/dental problems, or  Sore throat,                No sneezing, itching, ear ache,  +nasal congestion, post nasal drip,   CV:  No chest pain,  Orthopnea, PND, swelling in lower extremities, anasarca, dizziness, palpitations, syncope.   GI  No heartburn, indigestion, abdominal pain, nausea, vomiting, diarrhea, change in  bowel habits, loss of appetite, bloody stools.   Resp:    No chest wall deformity  Skin: no rash or lesions.  GU: no dysuria, change in color of urine, no urgency or frequency.  No flank pain, no hematuria   MS:  No joint pain or swelling.  No decreased range of motion.  No back pain.    Physical Exam  BP 117/69 (BP Location: Right Arm, Patient Position: Sitting, Cuff Size: Normal)   Pulse 85   Ht 5' 5.5" (1.664 m)   Wt 111 lb 0.6 oz (50.4 kg)   SpO2 99%   BMI 18.20 kg/m   GEN: A/Ox3; pleasant , NAD,  Thin and elderly    HEENT:  [place]/AT,  EACs-clear, TMs-wnl, NOSE-clear, THROAT-clear, no lesions, no postnasal drip or exudate noted.   NECK:  Supple w/ fair ROM; no JVD; normal carotid impulses w/o bruits; no thyromegaly or nodules palpated; no lymphadenopathy.    RESP  Clear  P & A; w/o,  wheezes/ rales/ or rhonchi. no accessory muscle use, no dullness to percussion  CARD:  RRR, no m/r/g, no peripheral edema, pulses intact, no cyanosis or clubbing.  GI:   Soft & nt; nml bowel sounds; no organomegaly or masses detected.   Musco: Warm bil, no deformities or joint swelling noted.   Neuro: alert, no focal deficits noted.    Skin: Warm, no lesions or rashes    Lab Results:  CBC  No results found for: BNP  ProBNP No results found for: PROBNP  Imaging: Ct Chest High Resolution  Result Date: 08/30/2017 CLINICAL DATA:  Acute exacerbation of bronchiectasis. History of breast cancer. EXAM: CT CHEST WITHOUT CONTRAST TECHNIQUE: Multidetector CT imaging of the chest was performed following the standard protocol without intravenous contrast. High resolution imaging of the lungs, as well as inspiratory and expiratory imaging, was performed. COMPARISON:  07/11/2017 chest radiograph.  01/18/2015 chest CT. FINDINGS: Cardiovascular: Normal heart size. No significant pericardial fluid/thickening. Left main and left anterior descending coronary atherosclerosis. Atherosclerotic  nonaneurysmal thoracic aorta. Normal caliber pulmonary arteries. Mediastinum/Nodes: No discrete thyroid nodules. Unremarkable esophagus. No pathologically enlarged axillary, mediastinal or gross hilar lymph nodes, noting limited sensitivity for the detection of hilar adenopathy on this noncontrast study. Lungs/Pleura: No pneumothorax. No pleural effusion. Sharply marginated consolidation with associated volume loss and bronchiectasis at the right greater than left lung apices and in the anterior right upper lobe, unchanged, compatible with radiation fibrosis. Patchy mild-to-moderate cylindrical and varicoid bronchiectasis in the mid to lower lungs bilaterally, most prominent in the right middle lobe, worsened since 01/18/2015. Bandlike consolidation with associated volume loss in the dependent inferior segment lingula and in the medial segment right middle lobe, worsened, compatible with postinfectious scarring. Moderate patchy tree-in-bud opacities throughout the mid to lower lungs bilaterally, significantly increased particularly in the lower lobes. New patchy and confluent subsolid nodules in the right lower lobe, for example a 7 mm posterior right lower lobe subsolid nodule (series 5/image 103). No significant air trapping on the expiration sequence. Upper abdomen: Small simple liver cysts, largest 1.9 cm in the right liver lobe. Musculoskeletal: No aggressive appearing focal osseous lesions. Mild thoracic spondylosis. Bilateral mastectomy. IMPRESSION: 1. Interval progression of spectrum of findings compatible with chronic infectious bronchiolitis due to atypical mycobacterial infection (MAI), particularly in the right greater than left lower lobes. Bronchiectasis is most prominent in the right middle lobe and has worsened. Consider follow-up post treatment high-resolution chest CT in 6 months. 2. Stable radiation fibrosis in the anterior right upper lobe and right greater than left lung apices. No findings  suspicious for metastatic disease in the chest. 3. Left main and 1 vessel coronary atherosclerosis. Aortic Atherosclerosis (ICD10-I70.0). Electronically Signed   By: Ilona Sorrel M.D.   On: 08/30/2017 14:32     Assessment & Plan:   BRONCHIECTASIS Recent flare -improved . CT chest shows progressive changes with hx of recurrent MAI. No active sx currently  If flares again , try to get sputum cx and sputum AFB.  Continue on flutter valve. Add VEST therapy to help with pulmonary hygiene   Plan  Patient Instructions  Add VEST therapy .  Mucinex Twice daily As needed   Continue on Flutter valve Three times a day  .  Follow up Dr. Elsworth Soho  In 3-4 months and As needed        Mycobacterium avium-intracellulare infection ([place]) Hx of recurrent AFB with previous tx .  CT chest shows progressive changes c/w with MAI .  Cont  to follow .       Rexene Edison, NP 09/27/2017

## 2017-10-04 NOTE — Progress Notes (Signed)
Reviewed & agree with plan  

## 2017-10-16 ENCOUNTER — Other Ambulatory Visit: Payer: Self-pay | Admitting: Internal Medicine

## 2017-10-22 ENCOUNTER — Encounter: Payer: Self-pay | Admitting: Adult Health

## 2017-10-22 DIAGNOSIS — J479 Bronchiectasis, uncomplicated: Secondary | ICD-10-CM

## 2017-10-22 NOTE — Telephone Encounter (Signed)
Per 10.18.18 visit w/ TP in [place]: Patient Instructions  Add VEST therapy .  Mucinex Twice daily As needed   Continue on Flutter valve Three times a day  .  Follow up Dr. Elsworth Soho  In 3-4 months and As needed     Order for Smart Vest placed E-mail sent to patient apologizing for inconvenience Will sign off

## 2017-11-07 ENCOUNTER — Telehealth: Payer: Self-pay | Admitting: Adult Health

## 2017-11-07 NOTE — Telephone Encounter (Signed)
Spoke with Collie Siad w/ [facility]  Pt has osteoporosis, which is a contraindication Collie Siad requesting verbal order to proceed Verbal order given  Will sign and forward to TP as FYI

## 2017-11-08 NOTE — Telephone Encounter (Signed)
That is fine 

## 2017-11-30 DIAGNOSIS — Z9013 Acquired absence of bilateral breasts and nipples: Secondary | ICD-10-CM | POA: Diagnosis not present

## 2017-11-30 DIAGNOSIS — C50411 Malignant neoplasm of upper-outer quadrant of right female breast: Secondary | ICD-10-CM | POA: Diagnosis not present

## 2017-11-30 DIAGNOSIS — Z79899 Other long term (current) drug therapy: Secondary | ICD-10-CM | POA: Diagnosis not present

## 2017-11-30 DIAGNOSIS — M8580 Other specified disorders of bone density and structure, unspecified site: Secondary | ICD-10-CM | POA: Diagnosis not present

## 2017-11-30 DIAGNOSIS — Z170 Estrogen receptor positive status [ER+]: Secondary | ICD-10-CM | POA: Diagnosis not present

## 2017-11-30 DIAGNOSIS — M8589 Other specified disorders of bone density and structure, multiple sites: Secondary | ICD-10-CM | POA: Insufficient documentation

## 2017-11-30 DIAGNOSIS — Z79811 Long term (current) use of aromatase inhibitors: Secondary | ICD-10-CM | POA: Insufficient documentation

## 2017-11-30 DIAGNOSIS — C50911 Malignant neoplasm of unspecified site of right female breast: Secondary | ICD-10-CM | POA: Diagnosis not present

## 2017-11-30 DIAGNOSIS — Z853 Personal history of malignant neoplasm of breast: Secondary | ICD-10-CM | POA: Diagnosis not present

## 2017-12-07 ENCOUNTER — Other Ambulatory Visit (INDEPENDENT_AMBULATORY_CARE_PROVIDER_SITE_OTHER): Payer: Medicare Other

## 2017-12-07 ENCOUNTER — Ambulatory Visit (INDEPENDENT_AMBULATORY_CARE_PROVIDER_SITE_OTHER): Payer: Medicare Other | Admitting: Internal Medicine

## 2017-12-07 ENCOUNTER — Encounter: Payer: Self-pay | Admitting: Internal Medicine

## 2017-12-07 VITALS — BP 122/8 | HR 73 | Temp 97.7°F | Ht 65.5 in | Wt 113.0 lb

## 2017-12-07 DIAGNOSIS — R7302 Impaired glucose tolerance (oral): Secondary | ICD-10-CM | POA: Diagnosis not present

## 2017-12-07 DIAGNOSIS — I10 Essential (primary) hypertension: Secondary | ICD-10-CM

## 2017-12-07 DIAGNOSIS — I2510 Atherosclerotic heart disease of native coronary artery without angina pectoris: Secondary | ICD-10-CM | POA: Diagnosis not present

## 2017-12-07 DIAGNOSIS — E785 Hyperlipidemia, unspecified: Secondary | ICD-10-CM

## 2017-12-07 LAB — CBC WITH DIFFERENTIAL/PLATELET
BASOS ABS: 0 10*3/uL (ref 0.0–0.1)
BASOS PCT: 0.6 % (ref 0.0–3.0)
Eosinophils Absolute: 0.1 10*3/uL (ref 0.0–0.7)
Eosinophils Relative: 0.9 % (ref 0.0–5.0)
HEMATOCRIT: 44.4 % (ref 36.0–46.0)
HEMOGLOBIN: 14.6 g/dL (ref 12.0–15.0)
LYMPHS PCT: 15.9 % (ref 12.0–46.0)
Lymphs Abs: 1.1 10*3/uL (ref 0.7–4.0)
MCHC: 32.9 g/dL (ref 30.0–36.0)
MCV: 97.2 fl (ref 78.0–100.0)
MONOS PCT: 12.9 % — AB (ref 3.0–12.0)
Monocytes Absolute: 0.9 10*3/uL (ref 0.1–1.0)
NEUTROS ABS: 4.8 10*3/uL (ref 1.4–7.7)
Neutrophils Relative %: 69.7 % (ref 43.0–77.0)
PLATELETS: 187 10*3/uL (ref 150.0–400.0)
RBC: 4.57 Mil/uL (ref 3.87–5.11)
RDW: 14.5 % (ref 11.5–15.5)
WBC: 6.9 10*3/uL (ref 4.0–10.5)

## 2017-12-07 LAB — HEPATIC FUNCTION PANEL
ALBUMIN: 4.4 g/dL (ref 3.5–5.2)
ALK PHOS: 36 U/L — AB (ref 39–117)
ALT: 27 U/L (ref 0–35)
AST: 31 U/L (ref 0–37)
Bilirubin, Direct: 0.1 mg/dL (ref 0.0–0.3)
TOTAL PROTEIN: 7.7 g/dL (ref 6.0–8.3)
Total Bilirubin: 0.5 mg/dL (ref 0.2–1.2)

## 2017-12-07 LAB — BASIC METABOLIC PANEL
BUN: 15 mg/dL (ref 6–23)
CHLORIDE: 102 meq/L (ref 96–112)
CO2: 29 meq/L (ref 19–32)
Calcium: 10.1 mg/dL (ref 8.4–10.5)
Creatinine, Ser: 0.81 mg/dL (ref 0.40–1.20)
GFR: 73.83 mL/min (ref >60.00)
GLUCOSE: 90 mg/dL (ref 70–99)
Potassium: 4.2 mEq/L (ref 3.5–5.1)
SODIUM: 140 meq/L (ref 135–145)

## 2017-12-07 LAB — LIPID PANEL
CHOL/HDL RATIO: 2
Cholesterol: 174 mg/dL (ref 0–200)
HDL: 76.2 mg/dL (ref >39.00)
LDL CALC: 82 mg/dL (ref 0–99)
NONHDL: 98.09
TRIGLYCERIDES: 80 mg/dL (ref 0.0–149.0)
VLDL: 16 mg/dL (ref 0.0–40.0)

## 2017-12-07 LAB — URINALYSIS, ROUTINE W REFLEX MICROSCOPIC
Bilirubin Urine: NEGATIVE
Ketones, ur: NEGATIVE
Leukocytes, UA: NEGATIVE
Nitrite: NEGATIVE
PH: 7 (ref 5.0–8.0)
RBC / HPF: NONE SEEN (ref 0–?)
Specific Gravity, Urine: 1.01 (ref 1.000–1.030)
TOTAL PROTEIN, URINE-UPE24: NEGATIVE
Urine Glucose: NEGATIVE
Urobilinogen, UA: 0.2 (ref 0.0–1.0)

## 2017-12-07 LAB — HEMOGLOBIN A1C: Hgb A1c MFr Bld: 6.1 % (ref 4.6–6.5)

## 2017-12-07 LAB — TSH: TSH: 3.57 u[IU]/mL (ref 0.35–4.50)

## 2017-12-07 MED ORDER — ATORVASTATIN CALCIUM 20 MG PO TABS: 20.0000 mg | ORAL_TABLET | Freq: Every day | ORAL | 3 refills | Status: DC

## 2017-12-07 MED ORDER — CARISOPRODOL 350 MG PO TABS: 350.0000 mg | ORAL_TABLET | Freq: Two times a day (BID) | ORAL | 5 refills | Status: DC | PRN

## 2017-12-07 MED ORDER — TEMAZEPAM 15 MG PO CAPS: ORAL_CAPSULE | ORAL | 5 refills | Status: DC

## 2017-12-07 MED ORDER — OLMESARTAN MEDOXOMIL-HCTZ 20-12.5 MG PO TABS: 0.5000 | ORAL_TABLET | Freq: Every day | ORAL | 3 refills | Status: DC

## 2017-12-07 MED ORDER — AMLODIPINE BESYLATE 5 MG PO TABS: 5.0000 mg | ORAL_TABLET | Freq: Every day | ORAL | 3 refills | Status: DC

## 2017-12-07 NOTE — Patient Instructions (Addendum)
Your EKG was OK today  Please see your cardiologist about the coronary artery blockages found on the CT scan, especially involving the Left Main coronary artery  I suspect your new goal for the LDL should be less than 70  Please continue all other medications as before, and refills have been done if requested.  Please have the pharmacy call with any other refills you may need.  Please continue your efforts at being more active, low cholesterol diet, and weight control.  You are otherwise up to date with prevention measures today.  Please keep your appointments with your specialists as you may have planned  Please return in 1 year for your yearly visit, or sooner if needed

## 2017-12-07 NOTE — Progress Notes (Signed)
Subjective:    Patient ID: Diane Haney, female    DOB: 10-Feb-1946, 71 y.o.   MRN: [id number]  HPI  Here for yearly f/u;  Overall doing ok;  Pt denies Chest pain, worsening SOB, DOE, wheezing, orthopnea, PND, worsening LE edema, palpitations, dizziness or syncope.  Pt denies neurological change such as new headache, facial or extremity weakness.  Pt denies polydipsia, polyuria, or low sugar symptoms. Pt states overall good compliance with treatment and medications, good tolerability, and has been trying to follow appropriate diet.  Pt denies worsening depressive symptoms, suicidal ideation or panic. No fever, night sweats, wt loss, loss of appetite, or other constitutional symptoms.  Pt states good ability with ADL's, has low fall risk, home safety reviewed and adequate, no other significant changes in hearing or vision, and only occasionally active with exercise.  No need for f/u mamomgram s/p bilat mastectomy  Has CAD with left main and 1 vessel CAD by CT recent per Dr Elsworth Soho.  Pt not sure she wants higher dose statin, but will make appt with cardiology in [place] - Dr Marlowe Alt.  Past Medical History:  Diagnosis Date  . Breast cancer ([place]) 11/12/2012   Dx oct 2013 - [place] - Invasive ductal carcinoma, s/o bilat mastectomy with + margin  - also for XRT soon  . Bronchiectasis   . Chronic fatigue fibromyalgia syndrome 11/12/2012  . COPD (chronic obstructive pulmonary disease) ([place])   . DVT, lower extremity (HCC)    LLE  . Fibromyalgia   . GERD (gastroesophageal reflux disease)   . History of shingles   . Hyperlipidemia   . Hypertension   . Impaired glucose tolerance 11/14/2013  . Migraine   . Osteopenia   . Osteoporosis 10/16/2007   Qualifier: Diagnosis of  By: Jenny Reichmann MD, Hunt Oris   . Pulmonary Mycobacterium avium complex (MAC) infection ([place])   . Squamous cell skin cancer, multiple sites    Past Surgical History:  Procedure Laterality Date  . FOOT SURGERY    . mastectomy  bilateral oct 2013    . shoulder impingement    . VIDEO BRONCHOSCOPY N/A 05/07/2014   Procedure: VIDEO BRONCHOSCOPY WITHOUT FLUORO;  Surgeon: Elsie Stain, MD;  Location: [facility];  Service: Cardiopulmonary;  Laterality: N/A;    reports that she quit smoking about 32 years ago. Her smoking use included cigarettes. She has a 25.00 pack-year smoking history. she has never used smokeless tobacco. She reports that she does not drink alcohol or use drugs. family history includes Breast cancer in her other; Colon polyps in her father; Heart disease in her other; Hyperlipidemia in her other; Hypertension in her other; Lung cancer in her father; Other in her other. Allergies  Allergen Reactions  . Losartan     headache  . Adhesive [Tape]     Blisters  . Ciprofloxacin     REACTION: tendinitis  . Clarithromycin     REACTION: severe reflux  . Prednisone     REACTION: irregular heartbeat, not able to sleep   Current Outpatient Medications on File Prior to Visit  Medication Sig Dispense Refill  . aspirin 81 MG EC tablet Take 81 mg by mouth daily.      . calcium citrate-vitamin D (CITRACAL+D) 315-200 MG-UNIT per tablet Take 2 tablets by mouth daily.     Marland Kitchen denosumab (PROLIA) 60 MG/ML SOLN injection Inject 60 mg into the skin every 6 (six) months. Administer in upper arm, thigh, or abdomen    .  gabapentin (NEURONTIN) 100 MG capsule Take 100 mg by mouth 3 (three) times daily.    Marland Kitchen letrozole (FEMARA) 2.5 MG tablet Take 2.5 mg by mouth daily.    . Methylcellulose, Laxative, (CITRUCEL PO) Take 2 tablets by mouth daily.     . ranitidine (ZANTAC) 150 MG tablet     . Respiratory Therapy Supplies (FLUTTER) DEVI Use as directed. 1 each 0  . RESTASIS 0.05 % ophthalmic emulsion   3   No current facility-administered medications on file prior to visit.    Review of Systems  Constitutional: Negative for other unusual diaphoresis or sweats HENT: Negative for ear discharge or swelling Eyes: Negative for  other worsening visual disturbances Respiratory: Negative for stridor or other swelling  Gastrointestinal: Negative for worsening distension or other blood Genitourinary: Negative for retention or other urinary change Musculoskeletal: Negative for other MSK pain or swelling Skin: Negative for color change or other new lesions Neurological: Negative for worsening tremors and other numbness  Psychiatric/Behavioral: Negative for worsening agitation or other fatigue All other system neg per pt    Objective:   Physical Exam BP (!) 122/8   Pulse 73   Temp 97.7 F (36.5 C) (Oral)   Ht 5' 5.5" (1.664 m)   Wt 113 lb (51.3 kg)   SpO2 97%   BMI 18.52 kg/m  VS noted,  Constitutional: Pt appears in NAD HENT: Head: NCAT.  Right Ear: External ear normal.  Left Ear: External ear normal.  Eyes: . Pupils are equal, round, and reactive to light. Conjunctivae and EOM are normal Nose: without d/c or deformity Neck: Neck supple. Gross normal ROM Cardiovascular: Normal rate and regular rhythm.   Pulmonary/Chest: Effort normal and breath sounds without rales or wheezing.  Abd:  Soft, NT, ND, + BS, no organomegaly Neurological: Pt is alert. At baseline orientation, motor grossly intact Skin: Skin is warm. No rashes, other new lesions, no LE edema Psychiatric: Pt behavior is normal without agitation  No other exam findings  Lab Results  Component Value Date   WBC 6.9 12/07/2017   HGB 14.6 12/07/2017   HCT 44.4 12/07/2017   PLT 187.0 12/07/2017   GLUCOSE 90 12/07/2017   CHOL 174 12/07/2017   TRIG 80.0 12/07/2017   HDL 76.20 12/07/2017   LDLDIRECT 124.4 11/14/2013   LDLCALC 82 12/07/2017   ALT 27 12/07/2017   AST 31 12/07/2017   NA 140 12/07/2017   K 4.2 12/07/2017   CL 102 12/07/2017   CREATININE 0.81 12/07/2017   BUN 15 12/07/2017   CO2 29 12/07/2017   TSH 3.57 12/07/2017   HGBA1C 6.1 12/07/2017   ECG today I have personally interpreted: NSR, RAA, no acute ischemia changes       Assessment & Plan:

## 2017-12-08 NOTE — Assessment & Plan Note (Signed)
stable overall by history and exam, recent data reviewed with pt, and pt to continue medical treatment as before,  to f/u any worsening symptoms or concerns Lab Results  Component Value Date   HGBA1C 6.1 12/07/2017

## 2017-12-08 NOTE — Assessment & Plan Note (Signed)
stable overall by history and exam, recent data reviewed with pt, and pt to continue medical treatment as before,  to f/u any worsening symptoms or concerns BP Readings from Last 3 Encounters:  12/07/17 (!) 122/8  09/27/17 117/69  08/23/17 129/73

## 2017-12-08 NOTE — Assessment & Plan Note (Signed)
stable overall by history and exam, recent data reviewed with pt, and pt to continue medical treatment as before,  to f/u any worsening symptoms or concerns Lab Results  Component Value Date   LDLCALC 82 12/07/2017

## 2017-12-08 NOTE — Assessment & Plan Note (Signed)
asympt but involving left main, pt concerned and will plan to f/u herself with cardiology in [place] soon

## 2017-12-17 DIAGNOSIS — K219 Gastro-esophageal reflux disease without esophagitis: Secondary | ICD-10-CM | POA: Diagnosis not present

## 2018-01-04 DIAGNOSIS — R42 Dizziness and giddiness: Secondary | ICD-10-CM | POA: Diagnosis not present

## 2018-01-04 DIAGNOSIS — I10 Essential (primary) hypertension: Secondary | ICD-10-CM | POA: Diagnosis not present

## 2018-01-04 DIAGNOSIS — I2510 Atherosclerotic heart disease of native coronary artery without angina pectoris: Secondary | ICD-10-CM | POA: Diagnosis not present

## 2018-01-04 DIAGNOSIS — R002 Palpitations: Secondary | ICD-10-CM | POA: Diagnosis not present

## 2018-01-10 ENCOUNTER — Encounter: Payer: Self-pay | Admitting: Adult Health

## 2018-01-10 ENCOUNTER — Ambulatory Visit (INDEPENDENT_AMBULATORY_CARE_PROVIDER_SITE_OTHER): Payer: Medicare Other | Admitting: Adult Health

## 2018-01-10 DIAGNOSIS — J479 Bronchiectasis, uncomplicated: Secondary | ICD-10-CM

## 2018-01-10 DIAGNOSIS — A310 Pulmonary mycobacterial infection: Secondary | ICD-10-CM

## 2018-01-10 NOTE — Assessment & Plan Note (Signed)
Stable without flare  Cont vest and flutter   Plan  Patient Instructions  Continue on VEST therapy .  Mucinex Twice daily As needed   Continue on Flutter valve Three times a day  .  Follow up Dr. Elsworth Soho  In 3-4 months and As needed  In [facility]

## 2018-01-10 NOTE — Progress Notes (Signed)
@Patient  ID: Diane Haney, female    DOB: January 28, 1946, 72 y.o.   MRN: [id number]  Chief Complaint  Patient presents with  . Follow-up    Bronchiectasis     Referring provider: Biagio Borg, MD  HPI: 72  yo female with bronchiectasis and MAI   Was seen by ID Johnnye Sima), for recurrent MAI with previous tx of MAI triple therapy rx.  Finished last course (31mo) in Feb 2016 .  Hx stage I moderately differentiated invasive ductal carcinoma of the right breast.  Idiopathic LLE DVT (Dx at age 72  Significant tests/ events 2011 spirometry normal  CT 01/2015>Interval slight progression in right apical consolidation with air bronchograms, probably progression of radiation fibrosis. The left apical component is unchanged.  - stable chronic lung disease with bronchiectasis,scarring and peribronchial nodularity consistent  HRCT Chest 08/2017 >Sharply marginated consolidation with associated volume loss and bronchiectasis at the right greater than left lung apices and in the anterior right upper lobe, unchanged, compatible with radiation fibrosis. Patchy mild-to-moderate cylindrical and varicoid bronchiectasis in the mid to lower lungs bilaterally, most prominent in the right middle lobe, worsened since 01/18/2015. Bandlike consolidation with associated volume loss in the dependent inferior segment lingula and in the medial segment right middle lobe, worsened, compatible with postinfectious scarring. Moderate patchy tree-in-bud opacities throughout the mid to lower lungs bilaterally, significantly increased particularly in the lower lobes. New patchy and confluent subsolid nodules in the right lower lobe, for example a 7 mm posterior right lower lobe subsolid nodule  2018  PFT done last week was stable with FEV1 94% , ratio 76 , FVC 94%.    01/10/2018 Follow up Bronchiectasis  Patient presents for a 336-monthollow-up.  Patient has known bronchiectasis and recurrent MAI.  She says overall  since last visit she has been doing okay. She says she got a URI/bronchitis few weeks ago, but says she was able to wait it out and sx resolved.   She denies any hemoptysis.  Last visit VEST therapy was started. She uses Twice daily  For 15 min . Feels it may be loosening her mucus .   She also uses flutter valve 3 times daily. Appetite is good.  Flu shot utd. PVX and Prevnar utd.      Allergies  Allergen Reactions  . Losartan     headache  . Adhesive [Tape]     Blisters  . Ciprofloxacin     REACTION: tendinitis  . Clarithromycin     REACTION: severe reflux  . Prednisone     REACTION: irregular heartbeat, not able to sleep    Immunization History  Administered Date(s) Administered  . Influenza,inj,Quad PF,6+ Mos 08/25/2013, 07/29/2014  . Influenza-Unspecified 09/08/2015, 08/09/2016, 08/22/2017  . Pneumococcal Conjugate-13 11/14/2013  . Pneumococcal Polysaccharide-23 10/11/2006, 11/12/2012  . Td 12/12/2003, 09/08/2015  . Tdap 09/08/2015    Past Medical History:  Diagnosis Date  . [place]   [place], s/o bilat mastectomy with + margin  - also for XRT soon  . Bronchiectasis   . Chronic fatigue fibromyalgia syndrome 11/12/2012  . COPD (ch[place] disease) [place]  . DVT, lower extremity (H[facility])    LLE  . Fibromyalgia   . GERD (gastroesophageal reflux disease)   . History of shingles   . Hyperlipidemia   . Hypertension   . Impaired glucose tolerance 11/14/2013  . Migraine   . Osteopenia   .  Osteoporosis 10/16/2007   Qualifier: Diagnosis of  By: Jenny Reichmann MD, Hunt Oris   . Pulmonary Mycobacterium avium complex (MAC) infection ([place])   . Squamous cell skin cancer, multiple sites     Tobacco History: Social History   Tobacco Use  Smoking Status Former Smoker  . Packs/day: 1.00  . Years: 25.00  . Pack years: 25.00  . Types: Cigarettes  . Last attempt to quit: 12/11/1985  .  Years since quitting: 32.1  Smokeless Tobacco Never Used  Tobacco Comment   pt does not smoke   Counseling given: Not Answered Comment: pt does not smoke   Outpatient Encounter Medications as of 01/10/2018  Medication Sig  . amLODipine (NORVASC) 5 MG tablet Take 1 tablet (5 mg total) by mouth daily.  Marland Kitchen aspirin 81 MG EC tablet Take 81 mg by mouth daily.    Marland Kitchen atorvastatin (LIPITOR) 20 MG tablet Take 1 tablet (20 mg total) by mouth daily.  . calcium citrate-vitamin D (CITRACAL+D) 315-200 MG-UNIT per tablet Take 2 tablets by mouth daily.   . carisoprodol (SOMA) 350 MG tablet Take 1 tablet (350 mg total) by mouth 2 (two) times daily as needed for muscle spasms.  Marland Kitchen denosumab (PROLIA) 60 MG/ML SOLN injection Inject 60 mg into the skin every 6 (six) months. Administer in upper arm, thigh, or abdomen  . gabapentin (NEURONTIN) 100 MG capsule Take 100 mg by mouth 3 (three) times daily.  . Methylcellulose, Laxative, (CITRUCEL PO) Take 2 tablets by mouth daily.   Marland Kitchen olmesartan-hydrochlorothiazide (BENICAR HCT) 20-12.5 MG tablet Take 0.5 tablets by mouth daily.  . ranitidine (ZANTAC) 150 MG tablet   . Respiratory Therapy Supplies (FLUTTER) DEVI Use as directed.  . RESTASIS 0.05 % ophthalmic emulsion   . temazepam (RESTORIL) 15 MG capsule TAKE 1 OR 2 CAPSULES BY MOUTH EVERY NIGHT AT BEDTIME AS NEEDED FOR SLEEP  . letrozole (FEMARA) 2.5 MG tablet Take 2.5 mg by mouth daily.   No facility-administered encounter medications on file as of 01/10/2018.      Review of Systems  Constitutional:   No  weight loss, night sweats,  Fevers, chills, fatigue, or  lassitude.  HEENT:   No headaches,  Difficulty swallowing,  Tooth/dental problems, or  Sore throat,                No sneezing, itching, ear ache, nasal congestion, post nasal drip,   CV:  No chest pain,  Orthopnea, PND, swelling in lower extremities, anasarca, dizziness, palpitations, syncope.   GI  No heartburn, indigestion, abdominal pain, nausea,  vomiting, diarrhea, change in bowel habits, loss of appetite, bloody stools.   Resp:  .  No chest wall deformity  Skin: no rash or lesions.  GU: no dysuria, change in color of urine, no urgency or frequency.  No flank pain, no hematuria   MS:  No joint pain or swelling.  No decreased range of motion.  No back pain.    Physical Exam  BP 115/71 (BP Location: Left Arm, Cuff Size: Normal)   Pulse 88   Ht 5' 5"  (1.651 m)   Wt 113 lb 1.9 oz (51.3 kg)   SpO2 97%   BMI 18.82 kg/m   GEN: A/Ox3; pleasant , NAD, well nourished    HEENT:  [place]/AT,  EACs-clear, TMs-wnl, NOSE-clear, THROAT-clear, no lesions, no postnasal drip or exudate noted.   NECK:  Supple w/ fair ROM; no JVD; normal carotid impulses w/o bruits; no thyromegaly or nodules palpated; no lymphadenopathy.  RESP  Clear  P & A; w/o, wheezes/ rales/ or rhonchi. no accessory muscle use, no dullness to percussion  CARD:  RRR, no m/r/g, no peripheral edema, pulses intact, no cyanosis or clubbing.  GI:   Soft & nt; nml bowel sounds; no organomegaly or masses detected.   Musco: Warm bil, no deformities or joint swelling noted.   Neuro: alert, no focal deficits noted.    Skin: Warm, no lesions or rashes    Lab Results:  CBC    Component Value Date/Time   WBC 6.9 12/07/2017 1035   RBC 4.57 12/07/2017 1035   HGB 14.6 12/07/2017 1035   HCT 44.4 12/07/2017 1035   PLT 187.0 12/07/2017 1035   MCV 97.2 12/07/2017 1035   MCH 32.8 04/16/2014 1136   MCHC 32.9 12/07/2017 1035   RDW 14.5 12/07/2017 1035   LYMPHSABS 1.1 12/07/2017 1035   MONOABS 0.9 12/07/2017 1035   EOSABS 0.1 12/07/2017 1035   BASOSABS 0.0 12/07/2017 1035    BMET    Component Value Date/Time   NA 140 12/07/2017 1035   K 4.2 12/07/2017 1035   CL 102 12/07/2017 1035   CO2 29 12/07/2017 1035   GLUCOSE 90 12/07/2017 1035   BUN 15 12/07/2017 1035   CREATININE 0.81 12/07/2017 1035   CALCIUM 10.1 12/07/2017 1035   GFRNONAA 75.40 10/31/2010 1119   GFRAA  93 09/24/2008 1044    BNP No results found for: BNP  ProBNP No results found for: PROBNP  Imaging: No results found.   Assessment & Plan:   BRONCHIECTASIS Stable without flare  Cont vest and flutter   Plan  Patient Instructions  Continue on VEST therapy .  Mucinex Twice daily As needed   Continue on Flutter valve Three times a day  .  Follow up Dr. Elsworth Soho  In 3-4 months and As needed  In [facility]        Mycobacterium avium-intracellulare infection ([place]) No active flare  Cont to follow      Rexene Edison, NP 01/10/2018

## 2018-01-10 NOTE — Patient Instructions (Addendum)
Continue on VEST therapy .  Mucinex Twice daily As needed   Continue on Flutter valve Three times a day  .  Follow up Dr. Elsworth Soho  In 3-4 months and As needed  In [facility]

## 2018-01-10 NOTE — Assessment & Plan Note (Signed)
No active flare  Cont to follow

## 2018-02-14 DIAGNOSIS — L821 Other seborrheic keratosis: Secondary | ICD-10-CM | POA: Diagnosis not present

## 2018-02-14 DIAGNOSIS — L578 Other skin changes due to chronic exposure to nonionizing radiation: Secondary | ICD-10-CM | POA: Diagnosis not present

## 2018-02-14 DIAGNOSIS — D1801 Hemangioma of skin and subcutaneous tissue: Secondary | ICD-10-CM | POA: Diagnosis not present

## 2018-02-14 DIAGNOSIS — C44519 Basal cell carcinoma of skin of other part of trunk: Secondary | ICD-10-CM | POA: Diagnosis not present

## 2018-02-14 DIAGNOSIS — Z08 Encounter for follow-up examination after completed treatment for malignant neoplasm: Secondary | ICD-10-CM | POA: Diagnosis not present

## 2018-02-14 DIAGNOSIS — L570 Actinic keratosis: Secondary | ICD-10-CM | POA: Diagnosis not present

## 2018-02-14 DIAGNOSIS — D485 Neoplasm of uncertain behavior of skin: Secondary | ICD-10-CM | POA: Diagnosis not present

## 2018-02-14 DIAGNOSIS — Z85828 Personal history of other malignant neoplasm of skin: Secondary | ICD-10-CM | POA: Diagnosis not present

## 2018-03-04 DIAGNOSIS — H16223 Keratoconjunctivitis sicca, not specified as Sjogren's, bilateral: Secondary | ICD-10-CM | POA: Diagnosis not present

## 2018-03-04 DIAGNOSIS — H04123 Dry eye syndrome of bilateral lacrimal glands: Secondary | ICD-10-CM | POA: Diagnosis not present

## 2018-03-13 DIAGNOSIS — H608X3 Other otitis externa, bilateral: Secondary | ICD-10-CM | POA: Insufficient documentation

## 2018-03-13 DIAGNOSIS — J383 Other diseases of vocal cords: Secondary | ICD-10-CM | POA: Diagnosis not present

## 2018-04-29 ENCOUNTER — Encounter: Payer: Self-pay | Admitting: Pulmonary Disease

## 2018-04-29 ENCOUNTER — Ambulatory Visit (INDEPENDENT_AMBULATORY_CARE_PROVIDER_SITE_OTHER): Payer: Medicare Other | Admitting: Pulmonary Disease

## 2018-04-29 VITALS — BP 114/62 | HR 88 | Ht 65.5 in | Wt 113.0 lb

## 2018-04-29 DIAGNOSIS — R0602 Shortness of breath: Secondary | ICD-10-CM | POA: Diagnosis not present

## 2018-04-29 DIAGNOSIS — J479 Bronchiectasis, uncomplicated: Secondary | ICD-10-CM | POA: Diagnosis not present

## 2018-04-29 MED ORDER — SODIUM CHLORIDE 3 % IN NEBU: INHALATION_SOLUTION | Freq: Every day | RESPIRATORY_TRACT | 2 refills | Status: DC

## 2018-04-29 NOTE — Patient Instructions (Signed)
Prescription for nebulizer to DME. Use hypertonic saline nebs once daily x60 x 2 refills Continue to use vest twice a day Use Mucinex as needed only.  If you develop significant sputum call us for AFB culture.  High-resolution CT chest in 4 months

## 2018-04-29 NOTE — Assessment & Plan Note (Signed)
Mild disease progression in 2016 and 2018, her lung function stable  Hence we will continue airway clearance strategy. If she has significant sputum production we will repeat culture for MAI. We will consider bronchoscopy only if significant progression or recurrent bronchitis

## 2018-04-29 NOTE — Progress Notes (Signed)
   Subjective:    Patient ID: Diane Haney, female    DOB: 1946-05-28, 72 y.o.   MRN: [id number]  HPI  72  yo female with bronchiectasis and MAI   Was seen by ID Johnnye Sima), for recurrent MAI with previous tx of MAI triple therapy rx.  Finished last course (64mo) in Feb 2016 .  Hx stage I moderately differentiated invasive ductal carcinoma of the right breast.  Idiopathic LLE DVT (Dx at age 72  She has done well overall, gait 1-2 episodes of bronchitis a year [place] has been [place].  Vest twice daily and stop Mucinex and uses flutter valve.  Wt steady  Complains of nonproductive cough, irregular heartbeat and occasional headaches  Significant tests/ events 2011 spirometry normal  CT 01/2015>Interval slight progression in right apical consolidation with air bronchograms, probably progression of radiation fibrosis. The left apical component is unchanged.  - stable chronic lung disease with bronchiectasis,scarring and peribronchial nodularity   HRCT Chest 08/2017 >RUL apex >>compatible with radiation fibrosis.  Patchy mild-to-moderate cylindrical and varicoid bronchiectasis in the mid to lower lungs bilaterally, most prominent in the right middle lobe, worsened since 01/18/2015.  Lingula >>postinfectious scarring.  Moderate patchy tree-in-bud opacities throughout the mid to lower lungs bilaterally, significantly increased particularly in the lower lobes.  New patchy and confluent subsolid nodules in the right lower lobe, for example a 7 mm posterior right lower lobe subsolid nodule  09/2017  PFT-stable with FEV1 94% , ratio 76 , FVC 94%.  Past Medical History:  Diagnosis Date  . Breast cancer [place] 11/12/2012   [place], s/o bilat mastectomy with + margin  - also for XRT soon  . Bronchiectasis   . Chronic fatigue fibromyalgia syndrome 11/12/2012  .  COPD (chronic obstructive pulmonary disease) ([place]   . DVT, lower extremity ([facility])    LLE  . Fibromyalgia   . GERD (gastroesophageal reflux disease)   . History of shingles   . Hyperlipidemia   . Hypertension   . Impaired glucose tolerance 11/14/2013  . Migraine   . Osteopenia   . Osteoporosis 10/16/2007   Qualifier: Diagnosis of  By: JJenny ReichmannMD, JHunt Oris  . Pulmonary Mycobacterium avium complex (MAC) infection ([place]   . Squamous cell skin cancer, multiple sites       Review of Systems neg for any significant sore throat, dysphagia, itching, sneezing, nasal congestion or excess/ purulent secretions, fever, chills, sweats, unintended wt loss, pleuritic or exertional cp, hempoptysis, orthopnea pnd or change in chronic leg swelling.   Also denies presyncope, palpitations, heartburn, abdominal pain, nausea, vomiting, diarrhea or change in bowel or urinary habits, dysuria,hematuria, rash, arthralgias, visual complaints, headache, numbness weakness or ataxia.     Objective:   Physical Exam  Gen. Pleasant, thin, in no distress ENT - no thrush, no post nasal drip Neck: No JVD, no thyromegaly, no carotid bruits Lungs: no use of accessory muscles, no dullness to percussion, RLLrales no  rhonchi  Cardiovascular: Rhythm regular, heart sounds  normal, no murmurs or gallops, no peripheral edema Musculoskeletal: No deformities, no cyanosis or clubbing         Assessment & Plan:

## 2018-04-29 NOTE — Assessment & Plan Note (Signed)
Continue airway clearance measures as below-  Prescription for nebulizer to DME. Use hypertonic saline nebs once daily x60 x 2 refills Continue to use vest twice a day Flutter valve Use Mucinex as needed only.

## 2018-04-30 MED ORDER — SODIUM CHLORIDE 3 % IN NEBU: INHALATION_SOLUTION | Freq: Every day | RESPIRATORY_TRACT | 2 refills | Status: DC

## 2018-04-30 NOTE — Addendum Note (Signed)
Addended by: Maryanna Shape A on: 04/30/2018 09:24 AM   Modules accepted: Orders

## 2018-06-20 DIAGNOSIS — Z853 Personal history of malignant neoplasm of breast: Secondary | ICD-10-CM | POA: Diagnosis not present

## 2018-06-20 DIAGNOSIS — M8589 Other specified disorders of bone density and structure, multiple sites: Secondary | ICD-10-CM | POA: Diagnosis not present

## 2018-06-20 DIAGNOSIS — Z08 Encounter for follow-up examination after completed treatment for malignant neoplasm: Secondary | ICD-10-CM | POA: Diagnosis not present

## 2018-06-20 DIAGNOSIS — M8580 Other specified disorders of bone density and structure, unspecified site: Secondary | ICD-10-CM | POA: Diagnosis not present

## 2018-06-20 DIAGNOSIS — Z170 Estrogen receptor positive status [ER+]: Secondary | ICD-10-CM | POA: Diagnosis not present

## 2018-06-20 DIAGNOSIS — C50411 Malignant neoplasm of upper-outer quadrant of right female breast: Secondary | ICD-10-CM | POA: Diagnosis not present

## 2018-07-10 DIAGNOSIS — G8929 Other chronic pain: Secondary | ICD-10-CM | POA: Diagnosis not present

## 2018-07-10 DIAGNOSIS — M25572 Pain in left ankle and joints of left foot: Secondary | ICD-10-CM | POA: Diagnosis not present

## 2018-07-12 DIAGNOSIS — Z170 Estrogen receptor positive status [ER+]: Secondary | ICD-10-CM | POA: Diagnosis not present

## 2018-07-12 DIAGNOSIS — C50911 Malignant neoplasm of unspecified site of right female breast: Secondary | ICD-10-CM | POA: Diagnosis not present

## 2018-08-13 DIAGNOSIS — R208 Other disturbances of skin sensation: Secondary | ICD-10-CM | POA: Diagnosis not present

## 2018-08-22 ENCOUNTER — Ambulatory Visit (HOSPITAL_BASED_OUTPATIENT_CLINIC_OR_DEPARTMENT_OTHER)
Admission: RE | Admit: 2018-08-22 | Discharge: 2018-08-22 | Disposition: A | Discharge status: Home / Self Care | Payer: Medicare Other | Source: Ambulatory Visit | Attending: Pulmonary Disease | Admitting: Pulmonary Disease

## 2018-08-22 DIAGNOSIS — I2510 Atherosclerotic heart disease of native coronary artery without angina pectoris: Secondary | ICD-10-CM | POA: Insufficient documentation

## 2018-08-22 DIAGNOSIS — I700 Atherosclerosis of aorta: Secondary | ICD-10-CM | POA: Insufficient documentation

## 2018-08-22 DIAGNOSIS — J479 Bronchiectasis, uncomplicated: Secondary | ICD-10-CM | POA: Insufficient documentation

## 2018-08-22 DIAGNOSIS — R0602 Shortness of breath: Secondary | ICD-10-CM | POA: Insufficient documentation

## 2018-08-22 DIAGNOSIS — D1801 Hemangioma of skin and subcutaneous tissue: Secondary | ICD-10-CM | POA: Diagnosis not present

## 2018-08-22 DIAGNOSIS — Z85828 Personal history of other malignant neoplasm of skin: Secondary | ICD-10-CM | POA: Diagnosis not present

## 2018-08-22 DIAGNOSIS — L821 Other seborrheic keratosis: Secondary | ICD-10-CM | POA: Diagnosis not present

## 2018-08-22 DIAGNOSIS — L570 Actinic keratosis: Secondary | ICD-10-CM | POA: Diagnosis not present

## 2018-08-22 DIAGNOSIS — Z08 Encounter for follow-up examination after completed treatment for malignant neoplasm: Secondary | ICD-10-CM | POA: Diagnosis not present

## 2018-08-22 DIAGNOSIS — D2261 Melanocytic nevi of right upper limb, including shoulder: Secondary | ICD-10-CM | POA: Diagnosis not present

## 2018-08-22 DIAGNOSIS — L578 Other skin changes due to chronic exposure to nonionizing radiation: Secondary | ICD-10-CM | POA: Diagnosis not present

## 2018-09-04 ENCOUNTER — Encounter: Payer: Self-pay | Admitting: Pulmonary Disease

## 2018-09-04 ENCOUNTER — Ambulatory Visit (INDEPENDENT_AMBULATORY_CARE_PROVIDER_SITE_OTHER): Payer: Medicare Other | Admitting: Pulmonary Disease

## 2018-09-04 DIAGNOSIS — J479 Bronchiectasis, uncomplicated: Secondary | ICD-10-CM

## 2018-09-04 DIAGNOSIS — A310 Pulmonary mycobacterial infection: Secondary | ICD-10-CM

## 2018-09-04 DIAGNOSIS — Z23 Encounter for immunization: Secondary | ICD-10-CM | POA: Diagnosis not present

## 2018-09-04 MED ORDER — SODIUM CHLORIDE 3 % IN NEBU: INHALATION_SOLUTION | Freq: Every day | RESPIRATORY_TRACT | 11 refills | Status: DC

## 2018-09-04 NOTE — Assessment & Plan Note (Signed)
CT scan personally reviewed which shows stable changes of bronchiectasis and scarring. Her symptoms are mostly stable. Emphasis here will be on airway clearance strategies with hypertonic saline nebs and vest twice daily  We discussed signs and symptoms of bronchitis and she will call as early, if this occurs we will also obtain sputum culture

## 2018-09-04 NOTE — Addendum Note (Signed)
Addended by: Len Blalock on: 09/04/2018 11:35 AM   Modules accepted: Orders

## 2018-09-04 NOTE — Progress Notes (Signed)
   Subjective:    Patient ID: Diane Haney, female    DOB: [birth year], 72 y.o.   MRN: [id number]  HPI  72yo female with bronchiectasis and MAI   Was seen by ID Johnnye Sima), for recurrent MAI with previous tx of MAI triple therapy rx.  Finished last course (64mon) in Feb 2016 .  Hx stage I moderately differentiated invasive ductal carcinoma of the right breast s/p RT.  Idiopathic LLE DVT (Dx at age 75)  She is compliant with vest and hypertonic saline nebs about twice daily.  She mostly has a dry cough and mild shortness of breath, occasionally she will bring up minimal amounts of phlegm. We reviewed CT scan today -bronchiectasis, nodules and scarring stable compared to 20 marginally worse compared to 2016  Significant tests/ events 2011 spirometry normal  CT 01/2015>Interval slight progression in right apical consolidation with air bronchograms, probably progression of radiation fibrosis. The left apical component is unchanged.  - stable chronic lung disease with bronchiectasis,scarring and peribronchial nodularity   HRCT Chest 08/2018 >> stable compared to 2018  10/2018PFT-stable with FEV1 94% , ratio 76 , FVC 94%.  Review of Systems neg for any significant sore throat, dysphagia, itching, sneezing, nasal congestion or excess/ purulent secretions, fever, chills, sweats, unintended wt loss, pleuritic or exertional cp, hempoptysis, orthopnea pnd or change in chronic leg swelling. Also denies presyncope, palpitations, heartburn, abdominal pain, nausea, vomiting, diarrhea or change in bowel or urinary habits, dysuria,hematuria, rash, arthralgias, visual complaints, headache, numbness weakness or ataxia.     Objective:   Physical Exam  Gen. Pleasant, thin, in no distress ENT - no thrush, no post nasal drip Neck: No JVD, no thyromegaly, no carotid bruits Lungs: no use of accessory muscles, no dullness to percussion, clear without rales or rhonchi  Cardiovascular:  Rhythm regular, heart sounds  normal, no murmurs or gallops, no peripheral edema Musculoskeletal: No deformities, no cyanosis or clubbing        Assessment & Plan:

## 2018-09-04 NOTE — Patient Instructions (Signed)
CT scan appears stable. Flu shot today. Refill on hypertonic saline nebs

## 2018-09-04 NOTE — Assessment & Plan Note (Signed)
Stable

## 2018-09-04 NOTE — Addendum Note (Signed)
Addended by: Len Blalock on: 09/04/2018 11:50 AM   Modules accepted: Orders

## 2018-09-05 ENCOUNTER — Telehealth: Payer: Self-pay | Admitting: *Deleted

## 2018-09-05 DIAGNOSIS — H02834 Dermatochalasis of left upper eyelid: Secondary | ICD-10-CM | POA: Diagnosis not present

## 2018-09-05 DIAGNOSIS — H43813 Vitreous degeneration, bilateral: Secondary | ICD-10-CM | POA: Diagnosis not present

## 2018-09-05 DIAGNOSIS — Z961 Presence of intraocular lens: Secondary | ICD-10-CM | POA: Diagnosis not present

## 2018-09-05 DIAGNOSIS — H16223 Keratoconjunctivitis sicca, not specified as Sjogren's, bilateral: Secondary | ICD-10-CM | POA: Diagnosis not present

## 2018-09-05 DIAGNOSIS — A310 Pulmonary mycobacterial infection: Secondary | ICD-10-CM | POA: Diagnosis not present

## 2018-09-05 DIAGNOSIS — H26491 Other secondary cataract, right eye: Secondary | ICD-10-CM | POA: Diagnosis not present

## 2018-09-05 DIAGNOSIS — H04123 Dry eye syndrome of bilateral lacrimal glands: Secondary | ICD-10-CM | POA: Diagnosis not present

## 2018-09-05 DIAGNOSIS — H5203 Hypermetropia, bilateral: Secondary | ICD-10-CM | POA: Diagnosis not present

## 2018-09-05 DIAGNOSIS — H02831 Dermatochalasis of right upper eyelid: Secondary | ICD-10-CM | POA: Diagnosis not present

## 2018-09-05 NOTE — Telephone Encounter (Signed)
Received immunization notification letter from [facility] pharmacy. Pt received annual flu shot (FLUAD). Abstracted in sxs.[name]

## 2018-10-28 ENCOUNTER — Other Ambulatory Visit: Payer: Self-pay

## 2018-11-20 ENCOUNTER — Encounter: Payer: Self-pay | Admitting: Internal Medicine

## 2018-11-20 ENCOUNTER — Other Ambulatory Visit (INDEPENDENT_AMBULATORY_CARE_PROVIDER_SITE_OTHER): Payer: Medicare Other

## 2018-11-20 ENCOUNTER — Ambulatory Visit (INDEPENDENT_AMBULATORY_CARE_PROVIDER_SITE_OTHER): Payer: Medicare Other | Admitting: Internal Medicine

## 2018-11-20 VITALS — BP 124/80 | HR 89 | Temp 98.0°F | Ht 65.5 in | Wt 114.0 lb

## 2018-11-20 DIAGNOSIS — R6889 Other general symptoms and signs: Secondary | ICD-10-CM | POA: Diagnosis not present

## 2018-11-20 DIAGNOSIS — E785 Hyperlipidemia, unspecified: Secondary | ICD-10-CM

## 2018-11-20 DIAGNOSIS — R7302 Impaired glucose tolerance (oral): Secondary | ICD-10-CM | POA: Diagnosis not present

## 2018-11-20 DIAGNOSIS — I10 Essential (primary) hypertension: Secondary | ICD-10-CM

## 2018-11-20 LAB — CBC WITH DIFFERENTIAL/PLATELET
Basophils Absolute: 0.1 10*3/uL (ref 0.0–0.1)
Basophils Relative: 0.8 % (ref 0.0–3.0)
Eosinophils Absolute: 0 10*3/uL (ref 0.0–0.7)
Eosinophils Relative: 0.6 % (ref 0.0–5.0)
HCT: 42.3 % (ref 36.0–46.0)
HEMOGLOBIN: 14.2 g/dL (ref 12.0–15.0)
Lymphocytes Relative: 13.6 % (ref 12.0–46.0)
Lymphs Abs: 1 10*3/uL (ref 0.7–4.0)
MCHC: 33.7 g/dL (ref 30.0–36.0)
MCV: 93.5 fl (ref 78.0–100.0)
Monocytes Absolute: 0.8 10*3/uL (ref 0.1–1.0)
Monocytes Relative: 11.5 % (ref 3.0–12.0)
NEUTROS ABS: 5.3 10*3/uL (ref 1.4–7.7)
Neutrophils Relative %: 73.5 % (ref 43.0–77.0)
Platelets: 223 10*3/uL (ref 150.0–400.0)
RBC: 4.52 Mil/uL (ref 3.87–5.11)
RDW: 14.3 % (ref 11.5–15.5)
WBC: 7.2 10*3/uL (ref 4.0–10.5)

## 2018-11-20 LAB — HEPATIC FUNCTION PANEL
ALT: 19 U/L (ref 0–35)
AST: 26 U/L (ref 0–37)
Albumin: 4.4 g/dL (ref 3.5–5.2)
Alkaline Phosphatase: 59 U/L (ref 39–117)
Bilirubin, Direct: 0.1 mg/dL (ref 0.0–0.3)
TOTAL PROTEIN: 7.7 g/dL (ref 6.0–8.3)
Total Bilirubin: 0.5 mg/dL (ref 0.2–1.2)

## 2018-11-20 LAB — LIPID PANEL
Cholesterol: 164 mg/dL (ref 0–200)
HDL: 80.8 mg/dL (ref >39.00)
LDL Cholesterol: 70 mg/dL (ref 0–99)
NonHDL: 83.13
TRIGLYCERIDES: 67 mg/dL (ref 0.0–149.0)
Total CHOL/HDL Ratio: 2
VLDL: 13.4 mg/dL (ref 0.0–40.0)

## 2018-11-20 LAB — URINALYSIS, ROUTINE W REFLEX MICROSCOPIC
Bilirubin Urine: NEGATIVE
Leukocytes, UA: NEGATIVE
Nitrite: NEGATIVE
PH: 6.5 (ref 5.0–8.0)
Specific Gravity, Urine: 1.01 (ref 1.000–1.030)
Total Protein, Urine: NEGATIVE
Urine Glucose: NEGATIVE
Urobilinogen, UA: 0.2 (ref 0.0–1.0)

## 2018-11-20 LAB — BASIC METABOLIC PANEL
BUN: 19 mg/dL (ref 6–23)
CO2: 29 mEq/L (ref 19–32)
Calcium: 10.3 mg/dL (ref 8.4–10.5)
Chloride: 100 mEq/L (ref 96–112)
Creatinine, Ser: 0.84 mg/dL (ref 0.40–1.20)
GFR: 70.61 mL/min (ref >60.00)
Glucose, Bld: 97 mg/dL (ref 70–99)
Potassium: 4.8 mEq/L (ref 3.5–5.1)
Sodium: 139 mEq/L (ref 135–145)

## 2018-11-20 LAB — TSH: TSH: 3.37 u[IU]/mL (ref 0.35–4.50)

## 2018-11-20 LAB — HEMOGLOBIN A1C: Hgb A1c MFr Bld: 6 % (ref 4.6–6.5)

## 2018-11-20 MED ORDER — ATORVASTATIN CALCIUM 20 MG PO TABS: 20.0000 mg | ORAL_TABLET | Freq: Every day | ORAL | 3 refills | Status: DC

## 2018-11-20 MED ORDER — CARISOPRODOL 350 MG PO TABS: 350.0000 mg | ORAL_TABLET | Freq: Two times a day (BID) | ORAL | 1 refills | Status: DC | PRN

## 2018-11-20 MED ORDER — TEMAZEPAM 15 MG PO CAPS: ORAL_CAPSULE | ORAL | 1 refills | Status: DC

## 2018-11-20 MED ORDER — DICLOFENAC SODIUM 1 % TD GEL: 2.0000 g | Freq: Four times a day (QID) | TRANSDERMAL | 5 refills | Status: DC | PRN

## 2018-11-20 MED ORDER — OLMESARTAN MEDOXOMIL-HCTZ 20-12.5 MG PO TABS: 0.5000 | ORAL_TABLET | Freq: Every day | ORAL | 3 refills | Status: DC

## 2018-11-20 MED ORDER — GABAPENTIN 100 MG PO CAPS: 100.0000 mg | ORAL_CAPSULE | Freq: Three times a day (TID) | ORAL | 3 refills | Status: DC

## 2018-11-20 MED ORDER — AMLODIPINE BESYLATE 5 MG PO TABS: 5.0000 mg | ORAL_TABLET | Freq: Every day | ORAL | 3 refills | Status: DC

## 2018-11-20 MED ORDER — RANITIDINE HCL 150 MG PO TABS: 150.0000 mg | ORAL_TABLET | Freq: Two times a day (BID) | ORAL | 3 refills | Status: DC

## 2018-11-20 NOTE — Assessment & Plan Note (Signed)
stable overall by history and exam, recent data reviewed with pt, and pt to continue medical treatment as before,  to f/u any worsening symptoms or concerns  

## 2018-11-20 NOTE — Assessment & Plan Note (Addendum)
stable overall by history and exam, and pt to continue medical treatment as before,  to f/u any worsening symptoms or concerns, consider increased lipitor to 40 for LDL > 70

## 2018-11-20 NOTE — Progress Notes (Signed)
Subjective:    Patient ID: Diane Haney, female    DOB: December 19, 1945, 72 y.o.   MRN: [id number]  HPI  Here to f/u; overall doing ok,  Pt denies chest pain, increasing sob or doe, wheezing, orthopnea, PND, increased LE swelling, palpitations, dizziness or syncope.  Pt denies new neurological symptoms such as new headache, or facial or extremity weakness or numbness.  Pt denies polydipsia, polyuria, or low sugar episode.  Pt states overall good compliance with meds, mostly trying to follow appropriate diet, with wt overall stable,  but little exercise however.  No new complaints Wt Readings from Last 3 Encounters:  11/20/18 114 lb (51.7 kg)  09/04/18 113 lb (51.3 kg)  04/29/18 113 lb (51.3 kg)  Feels cold all the time at home except for in the bath.   Pt denies fever, wt loss, night sweats, loss of appetite, or other constitutional symptoms Past Medical History:  Diagnosis Date  . Breast cancer ([place]) 11/12/2012   Dx oct 2013 - [place] - Invasive ductal carcinoma, s/o bilat mastectomy with + margin  - also for XRT soon  . Bronchiectasis   . Chronic fatigue fibromyalgia syndrome 11/12/2012  . COPD (chronic obstructive pulmonary disease) ([place])   . DVT, lower extremity ([facility])    LLE  . Fibromyalgia   . GERD (gastroesophageal reflux disease)   . History of shingles   . Hyperlipidemia   . Hypertension   . Impaired glucose tolerance 11/14/2013  . Migraine   . Osteopenia   . Osteoporosis 10/16/2007   Qualifier: Diagnosis of  By: Jenny Reichmann MD, Hunt Oris   . Pulmonary Mycobacterium avium complex (MAC) infection ([place])   . Squamous cell skin cancer, multiple sites    Past Surgical History:  Procedure Laterality Date  . FOOT SURGERY    . mastectomy bilateral oct 2013    . shoulder impingement    . VIDEO BRONCHOSCOPY N/A 05/07/2014   Procedure: VIDEO BRONCHOSCOPY WITHOUT FLUORO;  Surgeon: Elsie Stain, MD;  Location: [facility];  Service: Cardiopulmonary;  Laterality: N/A;    reports that she quit smoking about 32 years ago. Her smoking use included cigarettes. She has a 25.00 pack-year smoking history. She has never used smokeless tobacco. She reports that she does not drink alcohol or use drugs. family history includes Breast cancer in her other; Colon polyps in her father; Heart disease in her other; Hyperlipidemia in her other; Hypertension in her other; Lung cancer in her father; Other in her other. Allergies  Allergen Reactions  . Losartan     headache  . Adhesive [Tape]     Blisters  . Ciprofloxacin     REACTION: tendinitis  . Clarithromycin     REACTION: severe reflux  . Prednisone     REACTION: irregular heartbeat, not able to sleep   Current Outpatient Medications on File Prior to Visit  Medication Sig Dispense Refill  . aspirin 81 MG EC tablet Take 81 mg by mouth daily.      . calcium citrate-vitamin D (CITRACAL+D) 315-200 MG-UNIT per tablet Take 2 tablets by mouth daily.     . Methylcellulose, Laxative, (CITRUCEL PO) Take 2 tablets by mouth daily.     Marland Kitchen Respiratory Therapy Supplies (FLUTTER) DEVI Use as directed. 1 each 0  . sodium chloride HYPERTONIC 3 % nebulizer solution Take by nebulization daily. Once daily Dx J47.9 60 mL 11   No current facility-administered medications on file prior to visit.    Review  of Systems  Constitutional: Negative for other unusual diaphoresis or sweats HENT: Negative for ear discharge or swelling Eyes: Negative for other worsening visual disturbances Respiratory: Negative for stridor or other swelling  Gastrointestinal: Negative for worsening distension or other blood Genitourinary: Negative for retention or other urinary change Musculoskeletal: Negative for other MSK pain or swelling Skin: Negative for color change or other new lesions Neurological: Negative for worsening tremors and other numbness  Psychiatric/Behavioral: Negative for worsening agitation or other fatigue All other system neg per pt      Objective:   Physical Exam BP 124/80   Pulse 89   Temp 98 F (36.7 C) (Oral)   Ht 5' 5.5" (1.664 m)   Wt 114 lb (51.7 kg)   SpO2 96%   BMI 18.68 kg/m  VS noted,   Constitutional: Pt appears in NAD HENT: Head: NCAT.  Right Ear: External ear normal.  Left Ear: External ear normal.  Eyes: . Pupils are equal, round, and reactive to light. Conjunctivae and EOM are normal Nose: without d/c or deformity Neck: Neck supple. Gross normal ROM Cardiovascular: Normal rate and regular rhythm.   Pulmonary/Chest: Effort normal and breath sounds without rales or wheezing.  Abd:  Soft, NT, ND, + BS, no organomegaly Neurological: Pt is alert. At baseline orientation, motor grossly intact Skin: Skin is warm. No rashes, other new lesions, no LE edema Psychiatric: Pt behavior is normal without agitation ] No other exam findings  Lab Results  Component Value Date   WBC 7.2 11/20/2018   HGB 14.2 11/20/2018   HCT 42.3 11/20/2018   PLT 223.0 11/20/2018   GLUCOSE 97 11/20/2018   CHOL 164 11/20/2018   TRIG 67.0 11/20/2018   HDL 80.80 11/20/2018   LDLDIRECT 124.4 11/14/2013   LDLCALC 70 11/20/2018   ALT 19 11/20/2018   AST 26 11/20/2018   NA 139 11/20/2018   K 4.8 11/20/2018   CL 100 11/20/2018   CREATININE 0.84 11/20/2018   BUN 19 11/20/2018   CO2 29 11/20/2018   TSH 3.37 11/20/2018   HGBA1C 6.0 11/20/2018       Assessment & Plan:

## 2018-11-20 NOTE — Assessment & Plan Note (Signed)
Also for tsh with labs,  to f/u any worsening symptoms or concerns

## 2018-11-20 NOTE — Patient Instructions (Signed)

## 2018-11-22 ENCOUNTER — Ambulatory Visit: Payer: Medicare Other | Admitting: Internal Medicine

## 2018-12-13 DIAGNOSIS — K219 Gastro-esophageal reflux disease without esophagitis: Secondary | ICD-10-CM | POA: Diagnosis not present

## 2019-01-07 DIAGNOSIS — I10 Essential (primary) hypertension: Secondary | ICD-10-CM | POA: Diagnosis not present

## 2019-01-07 DIAGNOSIS — E7800 Pure hypercholesterolemia, unspecified: Secondary | ICD-10-CM | POA: Diagnosis not present

## 2019-01-07 DIAGNOSIS — R002 Palpitations: Secondary | ICD-10-CM | POA: Diagnosis not present

## 2019-01-07 DIAGNOSIS — I2510 Atherosclerotic heart disease of native coronary artery without angina pectoris: Secondary | ICD-10-CM | POA: Diagnosis not present

## 2019-02-25 DIAGNOSIS — H16223 Keratoconjunctivitis sicca, not specified as Sjogren's, bilateral: Secondary | ICD-10-CM | POA: Diagnosis not present

## 2019-02-25 DIAGNOSIS — H04123 Dry eye syndrome of bilateral lacrimal glands: Secondary | ICD-10-CM | POA: Diagnosis not present

## 2019-03-05 ENCOUNTER — Ambulatory Visit: Payer: Medicare Other | Admitting: Adult Health

## 2019-03-17 ENCOUNTER — Ambulatory Visit: Payer: Medicare Other | Admitting: Adult Health

## 2019-06-26 DIAGNOSIS — L814 Other melanin hyperpigmentation: Secondary | ICD-10-CM | POA: Diagnosis not present

## 2019-06-26 DIAGNOSIS — Z85828 Personal history of other malignant neoplasm of skin: Secondary | ICD-10-CM | POA: Diagnosis not present

## 2019-06-26 DIAGNOSIS — C44519 Basal cell carcinoma of skin of other part of trunk: Secondary | ICD-10-CM | POA: Diagnosis not present

## 2019-06-26 DIAGNOSIS — L578 Other skin changes due to chronic exposure to nonionizing radiation: Secondary | ICD-10-CM | POA: Diagnosis not present

## 2019-06-26 DIAGNOSIS — L570 Actinic keratosis: Secondary | ICD-10-CM | POA: Diagnosis not present

## 2019-06-26 DIAGNOSIS — Z08 Encounter for follow-up examination after completed treatment for malignant neoplasm: Secondary | ICD-10-CM | POA: Diagnosis not present

## 2019-07-07 ENCOUNTER — Ambulatory Visit: Payer: Medicare Other | Admitting: Adult Health

## 2019-07-09 ENCOUNTER — Encounter: Payer: Self-pay | Admitting: Adult Health

## 2019-07-09 ENCOUNTER — Other Ambulatory Visit: Payer: Self-pay

## 2019-07-09 ENCOUNTER — Ambulatory Visit (INDEPENDENT_AMBULATORY_CARE_PROVIDER_SITE_OTHER): Payer: Medicare Other | Admitting: Adult Health

## 2019-07-09 DIAGNOSIS — J479 Bronchiectasis, uncomplicated: Secondary | ICD-10-CM | POA: Diagnosis not present

## 2019-07-09 DIAGNOSIS — A310 Pulmonary mycobacterial infection: Secondary | ICD-10-CM | POA: Diagnosis not present

## 2019-07-09 DIAGNOSIS — J471 Bronchiectasis with (acute) exacerbation: Secondary | ICD-10-CM | POA: Diagnosis not present

## 2019-07-09 NOTE — Progress Notes (Signed)
_0  ID: Diane Haney, female    DOB: 1946-06-24, 73 y.o.   MRN: [id number]  Chief Complaint  Patient presents with  . Follow-up    Bronchiectasis     Referring provider: Biagio Borg, MD  HPI: 73 year old female, former smoker followed for bronchiectasis and MAI Previous consultation with infectious disease, Dr. Johnnye Sima, for recurrent MAI with previous treatment of MAI triple therapy.  Last finished course was in February 2016-completed 18 months of therapy  Medical history significant for breast cancer- stage I moderately differentiated invasive ductal carcinoma of the right breast status post radiation, Idiopathic left lower extremity DVT age 3 '  TEST/EVENTS :  2011 spirometry normal  CT 01/2015>Interval slight progression in right apical consolidation with air bronchograms, probably progression of radiation fibrosis. The left apical component is unchanged.  - stable chronic lung disease with bronchiectasis,scarring and peribronchial nodularity   HRCT Chest 08/2018 >> stable compared to 2018  10/2018PFT-stable with FEV1 94% , ratio 76 , FVC 94%.   07/09/2019 Follow up : Bronchiectasis, MAI Patient returns for a one-year follow-up.  Patient has underlying bronchiectasis with recurrent MAI.  She has previously been treated with triple therapy completed last course in 2016.  Since last visit patient says she has been doing about the same. Says she has had 2 episodes over last several months of cough, low grade fever that lasts for 1-2 days then returned to her baseline . Did not require treatment . Had another episode 2 months ago when she coughed up trace blood in mucus x 1 episode . No further episodes.  Uses VEST therapy Twice daily  . Uses hypertonic nebs Twice daily  .  No antibiotics in last year. Activity level is lower due to COVID 19. Does home exercises with stretches, exercise bike . Does feel breathing might be slowly going down hill. Does feel fatigue  is getting worse. Weight is stable . Appetite is good.   High-resolution CT chest September 2019 showed stable bronchiectasis with no evidence of interstitial lung disease.   Allergies  Allergen Reactions  . Losartan     headache  . Adhesive [Tape]     Blisters  . Ciprofloxacin     REACTION: tendinitis  . Clarithromycin     REACTION: severe reflux  . Prednisone     REACTION: irregular heartbeat, not able to sleep    Immunization History  Administered Date(s) Administered  . Influenza,inj,Quad PF,6+ Mos 08/25/2013, 07/29/2014, 09/04/2018  . Influenza-Unspecified 09/08/2015, 08/09/2016, 08/22/2017  . Pneumococcal Conjugate-13 11/14/2013  . Pneumococcal Polysaccharide-23 10/11/2006, 11/12/2012  . Td 12/12/2003, 09/08/2015  . Tdap 09/08/2015    Past Medical History:  Diagnosis Date  . Breast cancer ([place]) 11/12/2012   Dx oct 2013 - [place] - Invasive ductal carcinoma, s/o bilat mastectomy with + margin  - also for XRT soon  . Bronchiectasis   . Chronic fatigue fibromyalgia syndrome 11/12/2012  . COPD (chronic obstructive pulmonary disease) ([place])   . DVT, lower extremity ([facility])    LLE  . Fibromyalgia   . GERD (gastroesophageal reflux disease)   . History of shingles   . Hyperlipidemia   . Hypertension   . Impaired glucose tolerance 11/14/2013  . Migraine   . Osteopenia   . Osteoporosis 10/16/2007   Qualifier: Diagnosis of  By: Jenny Reichmann MD, Hunt Oris   . Pulmonary Mycobacterium avium complex (MAC) infection ([place])   . Squamous cell skin cancer, multiple sites     Tobacco History:  Social History   Tobacco Use  Smoking Status Former Smoker  . Packs/day: 1.00  . Years: 25.00  . Pack years: 25.00  . Types: Cigarettes  . Quit date: 12/11/1985  . Years since quitting: 33.5  Smokeless Tobacco Never Used  Tobacco Comment   pt does not smoke   Counseling given: Not Answered Comment: pt does not smoke   Outpatient Medications Prior to Visit  Medication Sig  Dispense Refill  . amLODipine (NORVASC) 5 MG tablet Take 1 tablet (5 mg total) by mouth daily. 90 tablet 3  . aspirin 81 MG EC tablet Take 81 mg by mouth daily.      Marland Kitchen atorvastatin (LIPITOR) 20 MG tablet Take 1 tablet (20 mg total) by mouth daily. 90 tablet 3  . calcium citrate-vitamin D (CITRACAL+D) 315-200 MG-UNIT per tablet Take 2 tablets by mouth daily.     . carisoprodol (SOMA) 350 MG tablet Take 1 tablet (350 mg total) by mouth 2 (two) times daily as needed for muscle spasms. 180 tablet 1  . diclofenac sodium (VOLTAREN) 1 % GEL Apply 2 g topically 4 (four) times daily as needed. 100 g 5  . gabapentin (NEURONTIN) 100 MG capsule Take 1 capsule (100 mg total) by mouth 3 (three) times daily. 270 capsule 3  . olmesartan-hydrochlorothiazide (BENICAR HCT) 20-12.5 MG tablet Take 0.5 tablets by mouth daily. 45 tablet 3  . ranitidine (ZANTAC) 150 MG tablet Take 1 tablet (150 mg total) by mouth 2 (two) times daily. 180 tablet 3  . Respiratory Therapy Supplies (FLUTTER) DEVI Use as directed. 1 each 0  . sodium chloride HYPERTONIC 3 % nebulizer solution Take by nebulization daily. Once daily Dx J47.9 60 mL 11  . temazepam (RESTORIL) 15 MG capsule TAKE 1 OR 2 CAPSULES BY MOUTH EVERY NIGHT AT BEDTIME AS NEEDED FOR SLEEP 180 capsule 1  . Methylcellulose, Laxative, (CITRUCEL PO) Take 2 tablets by mouth daily.      No facility-administered medications prior to visit.      Review of Systems:   Constitutional:   No  weight loss, night sweats,  Fevers, chills,  +fatigue, or  lassitude.  HEENT:   No headaches,  Difficulty swallowing,  Tooth/dental problems, or  Sore throat,                No sneezing, itching, ear ache, nasal congestion, post nasal drip,   CV:  No chest pain,  Orthopnea, PND, swelling in lower extremities, anasarca, dizziness, palpitations, syncope.   GI  No heartburn, indigestion, abdominal pain, nausea, vomiting, diarrhea, change in bowel habits, loss of appetite, bloody stools.    Resp:    No chest wall deformity  Skin: no rash or lesions.  GU: no dysuria, change in color of urine, no urgency or frequency.  No flank pain, no hematuria   MS:  No joint pain or swelling.  No decreased range of motion.  No back pain.    Physical Exam  BP 124/64 (BP Location: Left Arm, Cuff Size: Normal)   Pulse 96   Temp 98.6 F (37 C) (Oral)   Ht 5' 5.5" (1.664 m)   Wt 112 lb 9.6 oz (51.1 kg)   SpO2 98%   BMI 18.45 kg/m   GEN: A/Ox3; pleasant , NAD , elderly    HEENT:  [place]/AT,    NOSE-clear, THROAT-clear, no lesions, no postnasal drip or exudate noted.   NECK:  Supple w/ fair ROM; no JVD; normal carotid impulses w/o bruits; no thyromegaly  or nodules palpated; no lymphadenopathy.    RESP few trace rhonchi   no accessory muscle use, no dullness to percussion  CARD:  RRR, no m/r/g, no peripheral edema, pulses intact, no cyanosis or clubbing.  GI:   Soft & nt; nml bowel sounds; no organomegaly or masses detected.   Musco: Warm bil, no deformities or joint swelling noted.   Neuro: alert, no focal deficits noted.    Skin: Warm, no lesions or rashes    Lab Results:   BNP No results found for: BNP  ProBNP No results found for: PROBNP  Imaging: No results found.    PFT Results Latest Ref Rng & Units 09/20/2017  FVC-Pre L 2.71  FVC-Predicted Pre % 88  FVC-Post L 2.86  FVC-Predicted Post % 93  Pre FEV1/FVC % % 80  Post FEV1/FCV % % 76  FEV1-Pre L 2.16  FEV1-Predicted Pre % 93  FEV1-Post L 2.17    No results found for: NITRICOXIDE      Assessment & Plan:   BRONCHIECTASIS Currently stable - did have a couple of isolated episodes of low grade fever and cough- these seem to be self limited and did not require treatment . Weight is stable. Will continue to follow . If episodes increase will try to get sputum cx and sputum afb .  Check CT chest in September   Plan  Patient Instructions  Continue on VEST therapy .  Continue on Hypertonic Neb Twice  daily   Mucinex Twice daily As needed   Continue on Flutter valve Three times a day  .  Activity as tolerated.  Follow up Dr. Elsworth Soho or Parrett NP  In 6 months and As needed       Mycobacterium avium-intracellulare infection ([place]) Appears stable -few isolated episodes but self limiting .   Weight stable  Will cont to monitor .  Check CT chest in September.   Plan  Patient Instructions  Continue on VEST therapy .  Continue on Hypertonic Neb Twice daily   Mucinex Twice daily As needed   Continue on Flutter valve Three times a day  .  Activity as tolerated.  Follow up Dr. Elsworth Soho or Parrett NP  In 6 months and As needed          Rexene Edison, NP 07/09/2019

## 2019-07-09 NOTE — Addendum Note (Signed)
Addended by: Parke Poisson E on: 07/09/2019 11:39 AM   Modules accepted: Orders

## 2019-07-09 NOTE — Assessment & Plan Note (Addendum)
Currently stable - did have a couple of isolated episodes of low grade fever and cough- these seem to be self limited and did not require treatment . Weight is stable. Will continue to follow . If episodes increase will try to get sputum cx and sputum afb .  Check CT chest in September   Plan  Patient Instructions  Continue on VEST therapy .  Continue on Hypertonic Neb Twice daily   Mucinex Twice daily As needed   Continue on Flutter valve Three times a day  .  Activity as tolerated.  Follow up Dr. Elsworth Soho or Kolina Kube NP  In 6 months and As needed

## 2019-07-09 NOTE — Patient Instructions (Signed)
Continue on VEST therapy .  Continue on Hypertonic Neb Twice daily   Mucinex Twice daily As needed   Continue on Flutter valve Three times a day  .  Activity as tolerated.  Follow up Dr. Elsworth Soho or Jakobie Henslee NP  In 6 months and As needed

## 2019-07-09 NOTE — Assessment & Plan Note (Signed)
Appears stable -few isolated episodes but self limiting .   Weight stable  Will cont to monitor .  Check CT chest in September.   Plan  Patient Instructions  Continue on VEST therapy .  Continue on Hypertonic Neb Twice daily   Mucinex Twice daily As needed   Continue on Flutter valve Three times a day  .  Activity as tolerated.  Follow up Dr. Elsworth Soho or Parrett NP  In 6 months and As needed

## 2019-07-10 DIAGNOSIS — I10 Essential (primary) hypertension: Secondary | ICD-10-CM | POA: Diagnosis not present

## 2019-07-10 DIAGNOSIS — J449 Chronic obstructive pulmonary disease, unspecified: Secondary | ICD-10-CM | POA: Diagnosis not present

## 2019-07-10 DIAGNOSIS — R002 Palpitations: Secondary | ICD-10-CM | POA: Diagnosis not present

## 2019-07-10 DIAGNOSIS — I2510 Atherosclerotic heart disease of native coronary artery without angina pectoris: Secondary | ICD-10-CM | POA: Diagnosis not present

## 2019-07-10 DIAGNOSIS — E785 Hyperlipidemia, unspecified: Secondary | ICD-10-CM | POA: Diagnosis not present

## 2019-07-11 DIAGNOSIS — I498 Other specified cardiac arrhythmias: Secondary | ICD-10-CM | POA: Diagnosis not present

## 2019-07-11 DIAGNOSIS — I517 Cardiomegaly: Secondary | ICD-10-CM | POA: Diagnosis not present

## 2019-07-17 DIAGNOSIS — M8588 Other specified disorders of bone density and structure, other site: Secondary | ICD-10-CM | POA: Diagnosis not present

## 2019-07-17 DIAGNOSIS — Z780 Asymptomatic menopausal state: Secondary | ICD-10-CM | POA: Diagnosis not present

## 2019-07-17 DIAGNOSIS — M810 Age-related osteoporosis without current pathological fracture: Secondary | ICD-10-CM | POA: Diagnosis not present

## 2019-07-23 DIAGNOSIS — R0781 Pleurodynia: Secondary | ICD-10-CM | POA: Diagnosis not present

## 2019-07-23 DIAGNOSIS — Z853 Personal history of malignant neoplasm of breast: Secondary | ICD-10-CM | POA: Diagnosis not present

## 2019-07-23 DIAGNOSIS — M8589 Other specified disorders of bone density and structure, multiple sites: Secondary | ICD-10-CM | POA: Diagnosis not present

## 2019-07-23 DIAGNOSIS — C50919 Malignant neoplasm of unspecified site of unspecified female breast: Secondary | ICD-10-CM | POA: Diagnosis not present

## 2019-07-23 DIAGNOSIS — Z87891 Personal history of nicotine dependence: Secondary | ICD-10-CM | POA: Diagnosis not present

## 2019-07-25 DIAGNOSIS — R002 Palpitations: Secondary | ICD-10-CM | POA: Diagnosis not present

## 2019-07-25 DIAGNOSIS — I2510 Atherosclerotic heart disease of native coronary artery without angina pectoris: Secondary | ICD-10-CM | POA: Diagnosis not present

## 2019-08-02 DIAGNOSIS — I491 Atrial premature depolarization: Secondary | ICD-10-CM | POA: Diagnosis not present

## 2019-08-02 DIAGNOSIS — I471 Supraventricular tachycardia: Secondary | ICD-10-CM | POA: Diagnosis not present

## 2019-08-25 ENCOUNTER — Ambulatory Visit (HOSPITAL_BASED_OUTPATIENT_CLINIC_OR_DEPARTMENT_OTHER)
Admission: RE | Admit: 2019-08-25 | Discharge: 2019-08-25 | Disposition: A | Discharge status: Home / Self Care | Payer: Medicare Other | Source: Ambulatory Visit | Attending: Adult Health | Admitting: Adult Health

## 2019-08-25 ENCOUNTER — Other Ambulatory Visit: Payer: Self-pay

## 2019-08-25 DIAGNOSIS — J471 Bronchiectasis with (acute) exacerbation: Secondary | ICD-10-CM | POA: Diagnosis not present

## 2019-08-25 DIAGNOSIS — J479 Bronchiectasis, uncomplicated: Secondary | ICD-10-CM | POA: Diagnosis not present

## 2019-08-25 DIAGNOSIS — R911 Solitary pulmonary nodule: Secondary | ICD-10-CM | POA: Diagnosis not present

## 2019-08-28 ENCOUNTER — Other Ambulatory Visit: Payer: Self-pay | Admitting: *Deleted

## 2019-08-28 DIAGNOSIS — R911 Solitary pulmonary nodule: Secondary | ICD-10-CM

## 2019-08-29 DIAGNOSIS — Z23 Encounter for immunization: Secondary | ICD-10-CM | POA: Diagnosis not present

## 2019-09-02 DIAGNOSIS — H524 Presbyopia: Secondary | ICD-10-CM | POA: Diagnosis not present

## 2019-09-02 DIAGNOSIS — Z961 Presence of intraocular lens: Secondary | ICD-10-CM | POA: Diagnosis not present

## 2019-09-02 DIAGNOSIS — H02831 Dermatochalasis of right upper eyelid: Secondary | ICD-10-CM | POA: Diagnosis not present

## 2019-09-02 DIAGNOSIS — H5203 Hypermetropia, bilateral: Secondary | ICD-10-CM | POA: Diagnosis not present

## 2019-09-02 DIAGNOSIS — H43813 Vitreous degeneration, bilateral: Secondary | ICD-10-CM | POA: Diagnosis not present

## 2019-09-02 DIAGNOSIS — H26491 Other secondary cataract, right eye: Secondary | ICD-10-CM | POA: Diagnosis not present

## 2019-09-02 DIAGNOSIS — H02834 Dermatochalasis of left upper eyelid: Secondary | ICD-10-CM | POA: Diagnosis not present

## 2019-09-02 DIAGNOSIS — H16223 Keratoconjunctivitis sicca, not specified as Sjogren's, bilateral: Secondary | ICD-10-CM | POA: Diagnosis not present

## 2019-09-02 DIAGNOSIS — H52203 Unspecified astigmatism, bilateral: Secondary | ICD-10-CM | POA: Diagnosis not present

## 2019-09-04 DIAGNOSIS — Z170 Estrogen receptor positive status [ER+]: Secondary | ICD-10-CM | POA: Diagnosis not present

## 2019-09-04 DIAGNOSIS — I10 Essential (primary) hypertension: Secondary | ICD-10-CM | POA: Diagnosis not present

## 2019-09-04 DIAGNOSIS — J449 Chronic obstructive pulmonary disease, unspecified: Secondary | ICD-10-CM | POA: Diagnosis not present

## 2019-09-04 DIAGNOSIS — I471 Supraventricular tachycardia: Secondary | ICD-10-CM | POA: Diagnosis not present

## 2019-09-04 DIAGNOSIS — E785 Hyperlipidemia, unspecified: Secondary | ICD-10-CM | POA: Diagnosis not present

## 2019-09-04 DIAGNOSIS — I2510 Atherosclerotic heart disease of native coronary artery without angina pectoris: Secondary | ICD-10-CM | POA: Diagnosis not present

## 2019-09-04 DIAGNOSIS — C50411 Malignant neoplasm of upper-outer quadrant of right female breast: Secondary | ICD-10-CM | POA: Diagnosis not present

## 2019-09-11 ENCOUNTER — Ambulatory Visit (INDEPENDENT_AMBULATORY_CARE_PROVIDER_SITE_OTHER): Payer: Medicare Other | Admitting: Pulmonary Disease

## 2019-09-11 ENCOUNTER — Other Ambulatory Visit: Payer: Self-pay

## 2019-09-11 ENCOUNTER — Encounter: Payer: Self-pay | Admitting: Pulmonary Disease

## 2019-09-11 DIAGNOSIS — R918 Other nonspecific abnormal finding of lung field: Secondary | ICD-10-CM | POA: Diagnosis not present

## 2019-09-11 DIAGNOSIS — J479 Bronchiectasis, uncomplicated: Secondary | ICD-10-CM | POA: Diagnosis not present

## 2019-09-11 NOTE — Progress Notes (Signed)
   Subjective:    Patient ID: Diane Haney, female    DOB: 1946/01/05, 73 y.o.   MRN: [id number]  HPI  73 yo ex-smoker with bronchiectasis and MAI  She smoked about 20 pack years before she quit in her early 92s  Was seen by ID Johnnye Sima), for recurrent MAI with previous tx of MAI triple therapy rx.  Finished last course (14mo) in Feb 2016 .  Hx stage I moderately differentiated invasive ductal carcinoma of the right breast s/p RT.  Idiopathic LLE DVT (Dx at age 73  Chief Complaint  Patient presents with  . Bronchiectasis    Wants to discuss CT scan results-one year follow up   She reports occasional low-grade fever 200 with sputum production-mostly clear She is compliant with hypertonic saline nebs She does report occasional dyspnea and feeling tired  She is masking and social distancing appropriately.  She got her flu shot at [facility]  We reviewed CT images from 2020 and compared to 2019  CT chest 08/2019 chronic changes including bronchiectasis, scarring and peribronchovascular nodularity compatible with mycobacterium avium complex. Several new large nodules are identified predominantly within the right lung measuring up to 1.6 cm. Likely post infectious or inflammatory in etiology  Significant tests/ events reviewed  2011 spirometry normal  CT 01/2015>Interval slight progression in right apical consolidation with air bronchograms, probably progression of radiation fibrosis. The left apical component is unchanged.  - stable chronic lung disease with bronchiectasis,scarring and peribronchial nodularity   HRCT Chest 08/2018 >>stable compared to 2018  10/2018PFT-stable with FEV1 94% , ratio 76 , FVC 94%  Review of Systems neg for any significant sore throat, dysphagia, itching, sneezing, nasal congestion or excess/ purulent secretions, fever, chills, sweats, unintended wt loss, pleuritic or exertional cp, hempoptysis, orthopnea pnd or change in chronic leg  swelling. Also denies presyncope, palpitations, heartburn, abdominal pain, nausea, vomiting, diarrhea or change in bowel or urinary habits, dysuria,hematuria, rash, arthralgias, visual complaints, headache, numbness weakness or ataxia.     Objective:   Physical Exam   Gen. Pleasant, thin, in no distress ENT - no thrush, no pallor/icterus,no post nasal drip Neck: No JVD, no thyromegaly, no carotid bruits Lungs: no use of accessory muscles, no dullness to percussion, clear without rales or rhonchi  Cardiovascular: Rhythm regular, heart sounds  normal, no murmurs or gallops, no peripheral edema Musculoskeletal: No deformities, no cyanosis or clubbing         Assessment & Plan:

## 2019-09-11 NOTE — Assessment & Plan Note (Signed)
She has3 new pulmonary nodules in the right lower lobe which were not present in 2019.  They do appear infectious or inflammatory-I do not feel that MAC is worse since underlying bronchiectasis is not worse Due to her prior history of breast cancer and remote smoking, I do feel that we have to do due diligence and follow-up with CT chest in 3 months

## 2019-09-11 NOTE — Assessment & Plan Note (Signed)
Appears stable on current CT which is why I do not think that MAC is active or flaring up

## 2019-09-11 NOTE — Patient Instructions (Signed)
10-month follow-up CT chest without contrast in mid December

## 2019-10-14 ENCOUNTER — Encounter: Payer: Self-pay | Admitting: Internal Medicine

## 2019-10-14 ENCOUNTER — Other Ambulatory Visit: Payer: Self-pay | Admitting: *Deleted

## 2019-10-14 DIAGNOSIS — M25571 Pain in right ankle and joints of right foot: Secondary | ICD-10-CM | POA: Diagnosis not present

## 2019-10-14 DIAGNOSIS — S93491A Sprain of other ligament of right ankle, initial encounter: Secondary | ICD-10-CM | POA: Diagnosis not present

## 2019-10-14 DIAGNOSIS — M79671 Pain in right foot: Secondary | ICD-10-CM | POA: Diagnosis not present

## 2019-10-14 MED ORDER — AMLODIPINE BESYLATE 5 MG PO TABS: 5.0000 mg | ORAL_TABLET | Freq: Every day | ORAL | 0 refills | Status: DC

## 2019-10-14 MED ORDER — GABAPENTIN 100 MG PO CAPS: 100.0000 mg | ORAL_CAPSULE | Freq: Three times a day (TID) | ORAL | 0 refills | Status: DC

## 2019-10-14 MED ORDER — OLMESARTAN MEDOXOMIL-HCTZ 20-12.5 MG PO TABS: 0.5000 | ORAL_TABLET | Freq: Every day | ORAL | 0 refills | Status: DC

## 2019-10-20 DIAGNOSIS — J479 Bronchiectasis, uncomplicated: Secondary | ICD-10-CM

## 2019-10-20 MED ORDER — SODIUM CHLORIDE 3 % IN NEBU: INHALATION_SOLUTION | Freq: Every day | RESPIRATORY_TRACT | 11 refills | Status: DC

## 2019-10-21 ENCOUNTER — Telehealth: Payer: Self-pay | Admitting: Pulmonary Disease

## 2019-10-21 NOTE — Telephone Encounter (Signed)
Spoke with pharmacist to clarify hypertonic saline.  Nothing further needed at this time- will close encounter.

## 2019-10-30 DIAGNOSIS — M659 Synovitis and tenosynovitis, unspecified: Secondary | ICD-10-CM | POA: Diagnosis not present

## 2019-10-30 DIAGNOSIS — M25571 Pain in right ankle and joints of right foot: Secondary | ICD-10-CM | POA: Diagnosis not present

## 2019-11-05 ENCOUNTER — Other Ambulatory Visit: Payer: Self-pay

## 2019-11-13 DIAGNOSIS — M659 Synovitis and tenosynovitis, unspecified: Secondary | ICD-10-CM | POA: Diagnosis not present

## 2019-11-13 DIAGNOSIS — S93401D Sprain of unspecified ligament of right ankle, subsequent encounter: Secondary | ICD-10-CM | POA: Diagnosis not present

## 2019-11-13 DIAGNOSIS — M25571 Pain in right ankle and joints of right foot: Secondary | ICD-10-CM | POA: Diagnosis not present

## 2019-11-24 ENCOUNTER — Other Ambulatory Visit: Payer: Self-pay

## 2019-11-24 ENCOUNTER — Ambulatory Visit (HOSPITAL_BASED_OUTPATIENT_CLINIC_OR_DEPARTMENT_OTHER)
Admission: RE | Admit: 2019-11-24 | Discharge: 2019-11-24 | Disposition: A | Discharge status: Home / Self Care | Payer: Medicare Other | Source: Ambulatory Visit | Attending: Pulmonary Disease | Admitting: Pulmonary Disease

## 2019-11-24 DIAGNOSIS — R911 Solitary pulmonary nodule: Secondary | ICD-10-CM | POA: Diagnosis not present

## 2019-12-02 ENCOUNTER — Encounter: Payer: Self-pay | Admitting: Internal Medicine

## 2019-12-02 ENCOUNTER — Ambulatory Visit (INDEPENDENT_AMBULATORY_CARE_PROVIDER_SITE_OTHER): Payer: Medicare Other | Admitting: Internal Medicine

## 2019-12-02 ENCOUNTER — Other Ambulatory Visit: Payer: Self-pay

## 2019-12-02 ENCOUNTER — Telehealth: Payer: Self-pay

## 2019-12-02 VITALS — BP 124/82 | HR 70 | Temp 98.4°F | Resp 14 | Ht 65.5 in | Wt 114.0 lb

## 2019-12-02 DIAGNOSIS — M810 Age-related osteoporosis without current pathological fracture: Secondary | ICD-10-CM | POA: Diagnosis not present

## 2019-12-02 DIAGNOSIS — E538 Deficiency of other specified B group vitamins: Secondary | ICD-10-CM | POA: Diagnosis not present

## 2019-12-02 DIAGNOSIS — R7302 Impaired glucose tolerance (oral): Secondary | ICD-10-CM

## 2019-12-02 DIAGNOSIS — I10 Essential (primary) hypertension: Secondary | ICD-10-CM | POA: Diagnosis not present

## 2019-12-02 DIAGNOSIS — E559 Vitamin D deficiency, unspecified: Secondary | ICD-10-CM | POA: Diagnosis not present

## 2019-12-02 DIAGNOSIS — E611 Iron deficiency: Secondary | ICD-10-CM | POA: Diagnosis not present

## 2019-12-02 DIAGNOSIS — E785 Hyperlipidemia, unspecified: Secondary | ICD-10-CM

## 2019-12-02 LAB — CBC WITH DIFFERENTIAL/PLATELET
Basophils Absolute: 0 10*3/uL (ref 0.0–0.1)
Basophils Relative: 0.6 % (ref 0.0–3.0)
Eosinophils Absolute: 0.1 10*3/uL (ref 0.0–0.7)
Eosinophils Relative: 0.9 % (ref 0.0–5.0)
HCT: 44 % (ref 36.0–46.0)
Hemoglobin: 14.6 g/dL (ref 12.0–15.0)
Lymphocytes Relative: 16.3 % (ref 12.0–46.0)
Lymphs Abs: 1.2 10*3/uL (ref 0.7–4.0)
MCHC: 33.3 g/dL (ref 30.0–36.0)
MCV: 96.4 fl (ref 78.0–100.0)
Monocytes Absolute: 0.8 10*3/uL (ref 0.1–1.0)
Monocytes Relative: 10.2 % (ref 3.0–12.0)
Neutro Abs: 5.4 10*3/uL (ref 1.4–7.7)
Neutrophils Relative %: 72 % (ref 43.0–77.0)
Platelets: 243 10*3/uL (ref 150.0–400.0)
RBC: 4.56 Mil/uL (ref 3.87–5.11)
RDW: 13.8 % (ref 11.5–15.5)
WBC: 7.5 10*3/uL (ref 4.0–10.5)

## 2019-12-02 LAB — URINALYSIS, ROUTINE W REFLEX MICROSCOPIC
Bilirubin Urine: NEGATIVE
Leukocytes,Ua: NEGATIVE
Nitrite: NEGATIVE
Specific Gravity, Urine: 1.02 (ref 1.000–1.030)
Total Protein, Urine: NEGATIVE
Urine Glucose: NEGATIVE
Urobilinogen, UA: 0.2 (ref 0.0–1.0)
pH: 6.5 (ref 5.0–8.0)

## 2019-12-02 LAB — HEPATIC FUNCTION PANEL
ALT: 27 U/L (ref 0–35)
AST: 31 U/L (ref 0–37)
Albumin: 4.6 g/dL (ref 3.5–5.2)
Alkaline Phosphatase: 56 U/L (ref 39–117)
Bilirubin, Direct: 0.1 mg/dL (ref 0.0–0.3)
Total Bilirubin: 0.6 mg/dL (ref 0.2–1.2)
Total Protein: 7.6 g/dL (ref 6.0–8.3)

## 2019-12-02 LAB — VITAMIN D 25 HYDROXY (VIT D DEFICIENCY, FRACTURES): VITD: 56.67 ng/mL (ref 30.00–100.00)

## 2019-12-02 LAB — BASIC METABOLIC PANEL
BUN: 16 mg/dL (ref 6–23)
CO2: 29 mEq/L (ref 19–32)
Calcium: 10.2 mg/dL (ref 8.4–10.5)
Chloride: 97 mEq/L (ref 96–112)
Creatinine, Ser: 0.83 mg/dL (ref 0.40–1.20)
GFR: 67.17 mL/min (ref >60.00)
Glucose, Bld: 93 mg/dL (ref 70–99)
Potassium: 3.7 mEq/L (ref 3.5–5.1)
Sodium: 138 mEq/L (ref 135–145)

## 2019-12-02 LAB — TSH: TSH: 4.47 u[IU]/mL (ref 0.35–4.50)

## 2019-12-02 LAB — LIPID PANEL
Cholesterol: 185 mg/dL (ref 0–200)
HDL: 72.9 mg/dL (ref >39.00)
LDL Cholesterol: 96 mg/dL (ref 0–99)
NonHDL: 111.67
Total CHOL/HDL Ratio: 3
Triglycerides: 76 mg/dL (ref 0.0–149.0)
VLDL: 15.2 mg/dL (ref 0.0–40.0)

## 2019-12-02 LAB — HEMOGLOBIN A1C: Hgb A1c MFr Bld: 6 % (ref 4.6–6.5)

## 2019-12-02 LAB — IBC PANEL
Iron: 123 ug/dL (ref 42–145)
Saturation Ratios: 34.1 % (ref 20.0–50.0)
Transferrin: 258 mg/dL (ref 212.0–360.0)

## 2019-12-02 LAB — VITAMIN B12: Vitamin B-12: 585 pg/mL (ref 211–911)

## 2019-12-02 NOTE — Telephone Encounter (Signed)
-----   Message from Biagio Borg, MD sent at 12/02/2019 11:17 AM EST ----- Please contact pt to restart prolia

## 2019-12-02 NOTE — Telephone Encounter (Signed)
Patient's insurance will be verified after Dec 12, 2019, we will call patient to discuss summary of benefits

## 2019-12-02 NOTE — Progress Notes (Signed)
Subjective:    Patient ID: Diane Haney, female    DOB: March 09, 1946, 73 y.o.   MRN: [id number]  HPI  Here for yearly f/u;  Overall doing ok;  Pt denies Chest pain, worsening SOB, DOE, wheezing, orthopnea, PND, worsening LE edema, palpitations, dizziness or syncope.  Pt denies neurological change such as new headache, facial or extremity weakness.  Pt denies polydipsia, polyuria, or low sugar symptoms. Pt states overall good compliance with treatment and medications, good tolerability, and has been trying to follow appropriate diet.  Pt denies worsening depressive symptoms, suicidal ideation or panic. No fever, night sweats, wt loss, loss of appetite, or other constitutional symptoms.  Pt states good ability with ADL's, has low fall risk, home safety reviewed and adequate, no other significant changes in hearing or vision, and only occasionally active with exercise. For osteoporosis, was off prolia while taking letrazole, now due to restart.  Last DXA summer 2000.  No new complaints Past Medical History:  Diagnosis Date  . Breast cancer ([place]) 11/12/2012   Dx oct 2013 - [place] - Invasive ductal carcinoma, s/o bilat mastectomy with + margin  - also for XRT soon  . Bronchiectasis   . Chronic fatigue fibromyalgia syndrome 11/12/2012  . COPD (chronic obstructive pulmonary disease) ([place])   . DVT, lower extremity ([facility])    LLE  . Fibromyalgia   . GERD (gastroesophageal reflux disease)   . History of shingles   . Hyperlipidemia   . Hypertension   . Impaired glucose tolerance 11/14/2013  . Migraine   . Osteopenia   . Osteoporosis 10/16/2007   Qualifier: Diagnosis of  By: Jenny Reichmann MD, Hunt Oris   . Pulmonary Mycobacterium avium complex (MAC) infection ([place])   . Squamous cell skin cancer, multiple sites    Past Surgical History:  Procedure Laterality Date  . FOOT SURGERY    . mastectomy bilateral oct 2013    . shoulder impingement    . VIDEO BRONCHOSCOPY N/A 05/07/2014   Procedure:  VIDEO BRONCHOSCOPY WITHOUT FLUORO;  Surgeon: Elsie Stain, MD;  Location: [facility];  Service: Cardiopulmonary;  Laterality: N/A;    reports that she quit smoking about 34 years ago. Her smoking use included cigarettes. She has a 25.00 pack-year smoking history. She has never used smokeless tobacco. She reports that she does not drink alcohol or use drugs. family history includes Breast cancer in an other family member; Colon polyps in her father; Heart disease in an other family member; Hyperlipidemia in an other family member; Hypertension in an other family member; Lung cancer in her father; Other in an other family member. Allergies  Allergen Reactions  . Losartan     headache  . Adhesive [Tape]     Blisters  . Ciprofloxacin     REACTION: tendinitis  . Clarithromycin     REACTION: severe reflux  . Prednisone     REACTION: irregular heartbeat, not able to sleep   Current Outpatient Medications on File Prior to Visit  Medication Sig Dispense Refill  . aspirin 81 MG EC tablet Take 81 mg by mouth daily.      . calcium citrate-vitamin D (CITRACAL+D) 315-200 MG-UNIT per tablet Take 2 tablets by mouth daily.     . diclofenac sodium (VOLTAREN) 1 % GEL Apply 2 g topically 4 (four) times daily as needed. 100 g 5  . famotidine (PEPCID) 20 MG tablet     . Respiratory Therapy Supplies (FLUTTER) DEVI Use as directed. 1  each 0  . sodium chloride HYPERTONIC 3 % nebulizer solution Take by nebulization daily. Once daily Dx J47.9 60 mL 11   No current facility-administered medications on file prior to visit.   Review of Systems  Constitutional: Negative for other unusual diaphoresis or sweats HENT: Negative for ear discharge or swelling Eyes: Negative for other worsening visual disturbances Respiratory: Negative for stridor or other swelling  Gastrointestinal: Negative for worsening distension or other blood Genitourinary: Negative for retention or other urinary change Musculoskeletal:  Negative for other MSK pain or swelling Skin: Negative for color change or other new lesions Neurological: Negative for worsening tremors and other numbness  Psychiatric/Behavioral: Negative for worsening agitation or other fatigue All otherwise neg per pt    Objective:   Physical Exam BP 124/82   Pulse 70   Temp 98.4 F (36.9 C)   Resp 14   Ht 5' 5.5" (1.664 m)   Wt 114 lb (51.7 kg)   SpO2 97%   BMI 18.68 kg/m  VS noted,  Constitutional: Pt appears in NAD HENT: Head: NCAT.  Right Ear: External ear normal.  Left Ear: External ear normal.  Eyes: . Pupils are equal, round, and reactive to light. Conjunctivae and EOM are normal Nose: without d/c or deformity Neck: Neck supple. Gross normal ROM Cardiovascular: Normal rate and regular rhythm.   Pulmonary/Chest: Effort normal and breath sounds without rales or wheezing.  Abd:  Soft, NT, ND, + BS, no organomegaly Neurological: Pt is alert. At baseline orientation, motor grossly intact Skin: Skin is warm. No rashes, other new lesions, no LE edema Psychiatric: Pt behavior is normal without agitation  All otherwise neg per pt  Lab Results  Component Value Date   WBC 7.5 12/02/2019   HGB 14.6 12/02/2019   HCT 44.0 12/02/2019   PLT 243.0 12/02/2019   GLUCOSE 93 12/02/2019   CHOL 185 12/02/2019   TRIG 76.0 12/02/2019   HDL 72.90 12/02/2019   LDLDIRECT 124.4 11/14/2013   LDLCALC 96 12/02/2019   ALT 27 12/02/2019   AST 31 12/02/2019   NA 138 12/02/2019   K 3.7 12/02/2019   CL 97 12/02/2019   CREATININE 0.83 12/02/2019   BUN 16 12/02/2019   CO2 29 12/02/2019   TSH 4.47 12/02/2019   HGBA1C 6.0 12/02/2019       Assessment & Plan:

## 2019-12-02 NOTE — Patient Instructions (Addendum)
You should hear from [facility] about the Prolia restart in a week or two  Please continue all other medications as before, and refills have been done if requested (to be sent later today)  Please have the pharmacy call with any other refills you may need.  Please continue your efforts at being more active, low cholesterol diet, and weight control.  You are otherwise up to date with prevention measures today.  Please keep your appointments with your specialists as you may have planned  Please go to the LAB at the blood drawing area for the tests to be done  You will be contacted by phone if any changes need to be made immediately.  Otherwise, you will receive a letter about your results with an explanation, but please check with MyChart first.  Please remember to sign up for MyChart if you have not done so, as this will be important to you in the future with finding out test results, communicating by private email, and scheduling acute appointments online when needed.  Please return in 1 year for your yearly visit, or sooner if needed

## 2019-12-03 ENCOUNTER — Encounter: Payer: Self-pay | Admitting: Internal Medicine

## 2019-12-03 MED ORDER — CARISOPRODOL 350 MG PO TABS: 350.0000 mg | ORAL_TABLET | Freq: Two times a day (BID) | ORAL | 1 refills | Status: DC | PRN

## 2019-12-03 MED ORDER — TEMAZEPAM 15 MG PO CAPS: ORAL_CAPSULE | ORAL | 1 refills | Status: DC

## 2019-12-03 MED ORDER — GABAPENTIN 100 MG PO CAPS: 100.0000 mg | ORAL_CAPSULE | Freq: Three times a day (TID) | ORAL | 1 refills | Status: DC

## 2019-12-03 MED ORDER — AMLODIPINE BESYLATE 5 MG PO TABS: 5.0000 mg | ORAL_TABLET | Freq: Every day | ORAL | 3 refills | Status: DC

## 2019-12-03 MED ORDER — OLMESARTAN MEDOXOMIL-HCTZ 20-12.5 MG PO TABS: 0.5000 | ORAL_TABLET | Freq: Every day | ORAL | 3 refills | Status: DC

## 2019-12-03 MED ORDER — METOPROLOL TARTRATE 25 MG PO TABS: 12.5000 mg | ORAL_TABLET | Freq: Two times a day (BID) | ORAL | 3 refills | Status: DC

## 2019-12-03 MED ORDER — ATORVASTATIN CALCIUM 20 MG PO TABS: 20.0000 mg | ORAL_TABLET | Freq: Every day | ORAL | 3 refills | Status: DC

## 2019-12-03 NOTE — Telephone Encounter (Signed)
All done

## 2019-12-14 ENCOUNTER — Encounter: Payer: Self-pay | Admitting: Internal Medicine

## 2019-12-14 NOTE — Assessment & Plan Note (Signed)
stable overall by history and exam, recent data reviewed with pt, and pt to continue medical treatment as before,  to f/u any worsening symptoms or concerns  

## 2019-12-14 NOTE — Assessment & Plan Note (Signed)
For restart prolia

## 2019-12-18 ENCOUNTER — Other Ambulatory Visit: Payer: Self-pay

## 2019-12-18 ENCOUNTER — Encounter: Payer: Self-pay | Admitting: Adult Health

## 2019-12-18 ENCOUNTER — Ambulatory Visit (INDEPENDENT_AMBULATORY_CARE_PROVIDER_SITE_OTHER): Payer: Medicare Other | Admitting: Adult Health

## 2019-12-18 DIAGNOSIS — A310 Pulmonary mycobacterial infection: Secondary | ICD-10-CM | POA: Diagnosis not present

## 2019-12-18 DIAGNOSIS — J479 Bronchiectasis, uncomplicated: Secondary | ICD-10-CM

## 2019-12-18 NOTE — Assessment & Plan Note (Signed)
Appears to be stable.  Recent CT chest was stable.  Plan  Patient Instructions  Continue on VEST therapy Twice daily   Continue on Hypertonic Neb daily  Mucinex Twice daily As needed   Continue on Flutter valve Three times a day  .  Activity as tolerated.  Follow up Dr. Elsworth Soho or Mellody Masri NP  In 3-4 months and As needed

## 2019-12-18 NOTE — Patient Instructions (Addendum)
Continue on VEST therapy Twice daily   Continue on Hypertonic Neb daily  Mucinex Twice daily As needed   Continue on Flutter valve Three times a day  .  Activity as tolerated.  Follow up Dr. Elsworth Soho or Khloi Rawl NP  In 3-4 months and As needed

## 2019-12-18 NOTE — Progress Notes (Signed)
_0  ID: Diane Haney, female    DOB: [birth year], 73 y.o.   MRN: [id number]  Chief Complaint  Patient presents with  . Follow-up    Bronchiectasis     Referring provider: Biagio Borg, MD  HPI: 74 year old former smoker followed for bronchiectasis and MAI Was seen by infectious disease Dr. Johnnye Sima for recurrent MAI with previous treatment of MAI triple therapy History of stage I moderately differentiated invasive ductal carcinoma the right breast status post radiation therapy Idiopathic left lower extremity DVT diagnosed at age 1   TEST/EVENTS :  2011 spirometry normal  CT 01/2015>Interval slight progression in right apical consolidation with air bronchograms, probably progression of radiation fibrosis. The left apical component is unchanged.  - stable chronic lung disease with bronchiectasis,scarring and peribronchial nodularity   HRCT Chest 08/2018 >>stable compared to 2018  10/2018PFT-stable with FEV1 94% , ratio 76 , FVC 94%  12/18/2019 Follow up : Bronchiectasis and MAI  Patient presents for a 87-monthfollow-up.  Patient has known underlying bronchiectasis and MAI.  Patient says since last visit she is doing about the same.  She is had no increased cough or congestion.  Has a daily cough that is intermittently productive.  She denies any hemoptysis fever or unintentional weight loss.  Patient did have a follow-up CT chest November 24, 2019 that showed stable diffuse patchy tree-in-bud findings.  Larger areas of nodularity in the right lung have resolved.  And there are some new vague areas of nodularity.  Consistent with MAI.  Stable mild emphysema.  And areas of bronchiectasis   Allergies  Allergen Reactions  . Losartan     headache  . Adhesive [Tape]     Blisters  . Ciprofloxacin     REACTION: tendinitis  . Clarithromycin     REACTION: severe reflux  . Prednisone     REACTION: irregular heartbeat, not able to sleep    Immunization History    Administered Date(s) Administered  . Influenza,inj,Quad PF,6+ Mos 08/25/2013, 07/29/2014, 09/04/2018  . Influenza-Unspecified 08/09/2016, 08/22/2017, 08/29/2019  . Pneumococcal Conjugate-13 11/14/2013  . Pneumococcal Polysaccharide-23 10/11/2006, 11/12/2012  . Td 12/12/2003, 09/08/2015  . Tdap 09/08/2015    Past Medical History:  Diagnosis Date  . Breast cancer [place] 11/12/2012   [place] Invasive ductal carcinoma, s/o bilat mastectomy with + margin  - also for XRT soon  . Bronchiectasis   . Chronic fatigue fibromyalgia syndrome 11/12/2012  . COPD (chronic obstructive pulmonary disease) ([place]   . DVT, lower extremity ([facility])    LLE  . Fibromyalgia   . GERD (gastroesophageal reflux disease)   . History of shingles   . Hyperlipidemia   . Hypertension   . Impaired glucose tolerance 11/14/2013  . Migraine   . Osteopenia   . Osteoporosis 10/16/2007   Qualifier: Diagnosis of  By: JJenny ReichmannMD, JHunt Oris  . Pulmonary Mycobacterium avium complex (MAC) infection ([facility]   . Squamous cell skin cancer, multiple sites     Tobacco History: Social History   Tobacco Use  Smoking Status Former Smoker  . Packs/day: 1.00  . Years: 25.00  . Pack years: 25.00  . Types: Cigarettes  . Quit date: 12/11/1985  . Years since quitting: 34.0  Smokeless Tobacco Never Used  Tobacco Comment   pt does not smoke   Counseling given: Not Answered Comment: pt does not smoke   Outpatient Medications Prior to Visit  Medication Sig Dispense Refill  .  amLODipine (NORVASC) 5 MG tablet Take 1 tablet (5 mg total) by mouth daily. 90 tablet 3  . aspirin 81 MG EC tablet Take 81 mg by mouth daily.      Marland Kitchen atorvastatin (LIPITOR) 20 MG tablet Take 1 tablet (20 mg total) by mouth daily. 90 tablet 3  . calcium citrate-vitamin D (CITRACAL+D) 315-200 MG-UNIT per tablet Take 2 tablets by mouth daily.     . carisoprodol (SOMA) 350 MG tablet Take 1 tablet (350 mg total) by mouth 2 (two) times  daily as needed for muscle spasms. 180 tablet 1  . diclofenac sodium (VOLTAREN) 1 % GEL Apply 2 g topically 4 (four) times daily as needed. 100 g 5  . famotidine (PEPCID) 20 MG tablet     . gabapentin (NEURONTIN) 100 MG capsule Take 1 capsule (100 mg total) by mouth 3 (three) times daily. 270 capsule 1  . metoprolol tartrate (LOPRESSOR) 25 MG tablet Take 0.5 tablets (12.5 mg total) by mouth 2 (two) times daily. Pt taking .5 tab 90 tablet 3  . olmesartan-hydrochlorothiazide (BENICAR HCT) 20-12.5 MG tablet Take 0.5 tablets by mouth daily. 45 tablet 3  . Respiratory Therapy Supplies (FLUTTER) DEVI Use as directed. 1 each 0  . sodium chloride HYPERTONIC 3 % nebulizer solution Take by nebulization daily. Once daily Dx J47.9 60 mL 11  . temazepam (RESTORIL) 15 MG capsule TAKE 1 OR 2 CAPSULES BY MOUTH EVERY NIGHT AT BEDTIME AS NEEDED FOR SLEEP 180 capsule 1   No facility-administered medications prior to visit.     Review of Systems:   Constitutional:   No  weight loss, night sweats,  Fevers, chills,  +fatigue, or  lassitude.  HEENT:   No headaches,  Difficulty swallowing,  Tooth/dental problems, or  Sore throat,                No sneezing, itching, ear ache, nasal congestion, post nasal drip,   CV:  No chest pain,  Orthopnea, PND, swelling in lower extremities, anasarca, dizziness, palpitations, syncope.   GI  No heartburn, indigestion, abdominal pain, nausea, vomiting, diarrhea, change in bowel habits, loss of appetite, bloody stools.   Resp:    No chest wall deformity  Skin: no rash or lesions.  GU: no dysuria, change in color of urine, no urgency or frequency.  No flank pain, no hematuria   MS:  No joint pain or swelling.  No decreased range of motion.  No back pain.    Physical Exam  BP 118/72 (BP Location: Left Arm, Cuff Size: Normal)   Pulse 75   Temp (!) 97.1 F (36.2 C) (Temporal)   Ht 5' 5.5" (1.664 m)   Wt 114 lb (51.7 kg)   SpO2 94% Comment: RA  BMI 18.68 kg/m    GEN: A/Ox3; pleasant , NAD, thin female    HEENT:  [place]/AT,  , NOSE-clear, THROAT-clear, no lesions, no postnasal drip or exudate noted.   NECK:  Supple w/ fair ROM; no JVD; normal carotid impulses w/o bruits; no thyromegaly or nodules palpated; no lymphadenopathy.    RESP few trace rhonchi no accessory muscle use, no dullness to percussion  CARD:  RRR, no m/r/g, no peripheral edema, pulses intact, no cyanosis or clubbing.  GI:   Soft & nt; nml bowel sounds; no organomegaly or masses detected.   Musco: Warm bil, no deformities or joint swelling noted.   Neuro: alert, no focal deficits noted.    Skin: Warm, no lesions or rashes  Lab Results:  CBC  No results found for: BNP  ProBNP No results found for: PROBNP  Imaging: CT Chest Wo Contrast  Result Date: 11/24/2019 CLINICAL DATA:  Followup pulmonary nodules. EXAM: CT CHEST WITHOUT CONTRAST TECHNIQUE: Multidetector CT imaging of the chest was performed following the standard protocol without IV contrast. COMPARISON:  Multiple previous chest CTs. The most recent is 08/25/2019 FINDINGS: Cardiovascular: The heart is normal in size. No pericardial effusion. Stable mild tortuosity, ectasia and calcification of the thoracic aorta. Stable scattered coronary artery calcifications. Mediastinum/Nodes: Small scattered mediastinal and hilar lymph nodes are stable. No mass or overt adenopathy. The esophagus is grossly normal. Lungs/Pleura: Stable chronic mild emphysematous changes, pulmonary scarring, patchy areas of chronic bronchiectasis and right upper lobe, right middle lobe and lingular atelectasis. Stable diffuse patchy tree-in-bud findings, peribronchial thickening and airspace nodularity in all consistent with chronic inflammation or atypical infection such as MAC. The larger areas of nodularity in the right lung seen on the prior study have resolved and there are new areas of vague nodularity. This is very typical with MAC. I do not see  any worrisome pulmonary lesions to suggest neoplasm. No focal Upper Abdomen: No significant upper abdominal findings. Stable right hepatic lobe cyst. Scattered aortic calcifications. Musculoskeletal: Surgical changes from bilateral mastectomies. No chest wall mass or supraclavicular or axillary adenopathy. The left thyroid lobe appears normal. The right appears surgically absent. No significant bony findings. IMPRESSION: 1. Chronic changes of MAC as detailed above. No worrisome pulmonary lesions or acute overlying pulmonary process. 2. Stable mild emphysematous changes, areas of bronchiectasis and chronic atelectasis. 3. No mediastinal or hilar mass or adenopathy. 4. Stable atherosclerotic calcifications involving the thoracic and upper abdominal aorta. Aortic Atherosclerosis (ICD10-I70.0) and Emphysema (ICD10-J43.9). Electronically Signed   By: Marijo Sanes M.D.   On: 11/24/2019 11:31      PFT Results Latest Ref Rng & Units 09/20/2017  FVC-Pre L 2.71  FVC-Predicted Pre % 88  FVC-Post L 2.86  FVC-Predicted Post % 93  Pre FEV1/FVC % % 80  Post FEV1/FCV % % 76  FEV1-Pre L 2.16  FEV1-Predicted Pre % 93  FEV1-Post L 2.17    No results found for: NITRICOXIDE      Assessment & Plan:   BRONCHIECTASIS Appears to be stable.  Recent CT chest was stable.  Plan  Patient Instructions  Continue on VEST therapy Twice daily   Continue on Hypertonic Neb daily  Mucinex Twice daily As needed   Continue on Flutter valve Three times a day  .  Activity as tolerated.  Follow up Dr. Elsworth Soho or Aviana Shevlin NP  In 3-4 months and As needed       Mycobacterium avium-intracellulare infection ([place]) Known MAI.  Without acute flare at this time.  Recent CT chest was stable. Continue to monitor.  Total patient care 31 minutes   Rexene Edison, NP 12/18/2019

## 2019-12-18 NOTE — Assessment & Plan Note (Signed)
Known MAI.  Without acute flare at this time.  Recent CT chest was stable. Continue to monitor.

## 2019-12-24 DIAGNOSIS — K219 Gastro-esophageal reflux disease without esophagitis: Secondary | ICD-10-CM | POA: Diagnosis not present

## 2019-12-30 ENCOUNTER — Encounter: Payer: Self-pay | Admitting: Internal Medicine

## 2019-12-30 NOTE — Telephone Encounter (Signed)
Will forward msg to [place] who checks eligibility for the injection. Pls advise on pt email...Diane Haney

## 2020-01-01 DIAGNOSIS — L814 Other melanin hyperpigmentation: Secondary | ICD-10-CM | POA: Diagnosis not present

## 2020-01-01 DIAGNOSIS — L821 Other seborrheic keratosis: Secondary | ICD-10-CM | POA: Diagnosis not present

## 2020-01-01 DIAGNOSIS — C44319 Basal cell carcinoma of skin of other parts of face: Secondary | ICD-10-CM | POA: Diagnosis not present

## 2020-01-01 DIAGNOSIS — L905 Scar conditions and fibrosis of skin: Secondary | ICD-10-CM | POA: Diagnosis not present

## 2020-01-01 DIAGNOSIS — L570 Actinic keratosis: Secondary | ICD-10-CM | POA: Diagnosis not present

## 2020-01-01 DIAGNOSIS — L578 Other skin changes due to chronic exposure to nonionizing radiation: Secondary | ICD-10-CM | POA: Diagnosis not present

## 2020-01-11 ENCOUNTER — Ambulatory Visit: Payer: Medicare Other

## 2020-01-15 ENCOUNTER — Ambulatory Visit: Payer: Medicare Other | Admitting: Adult Health

## 2020-01-16 ENCOUNTER — Ambulatory Visit: Payer: Medicare Other

## 2020-01-18 ENCOUNTER — Ambulatory Visit: Payer: Medicare Other

## 2020-01-27 DIAGNOSIS — E7800 Pure hypercholesterolemia, unspecified: Secondary | ICD-10-CM | POA: Diagnosis not present

## 2020-01-27 DIAGNOSIS — R002 Palpitations: Secondary | ICD-10-CM | POA: Diagnosis not present

## 2020-01-27 DIAGNOSIS — J449 Chronic obstructive pulmonary disease, unspecified: Secondary | ICD-10-CM | POA: Diagnosis not present

## 2020-01-27 DIAGNOSIS — I10 Essential (primary) hypertension: Secondary | ICD-10-CM | POA: Diagnosis not present

## 2020-01-27 DIAGNOSIS — I471 Supraventricular tachycardia: Secondary | ICD-10-CM | POA: Diagnosis not present

## 2020-01-27 DIAGNOSIS — I2510 Atherosclerotic heart disease of native coronary artery without angina pectoris: Secondary | ICD-10-CM | POA: Diagnosis not present

## 2020-02-02 NOTE — Telephone Encounter (Signed)
Dr. Elsworth Soho please advise on pt email.  Thanks!   Good morning, I have had my second vaccine and would like to travel by air to [place] in May to visit my daughter and son in [place]. More than likely they won't have been vaccinated by that time. My question is what are you advising your patients on travel that have been vaccinated and have bronchiectasis and MAI? So many conflicting reports out there it's hard to know what to do. Thank you  Diane Haney

## 2020-02-03 ENCOUNTER — Encounter: Payer: Self-pay | Admitting: Internal Medicine

## 2020-02-05 ENCOUNTER — Ambulatory Visit (HOSPITAL_BASED_OUTPATIENT_CLINIC_OR_DEPARTMENT_OTHER)
Admission: RE | Admit: 2020-02-05 | Discharge: 2020-02-05 | Disposition: A | Discharge status: Home / Self Care | Payer: Medicare Other | Source: Ambulatory Visit | Attending: Pulmonary Disease | Admitting: Pulmonary Disease

## 2020-02-05 ENCOUNTER — Other Ambulatory Visit: Payer: Self-pay

## 2020-02-05 ENCOUNTER — Ambulatory Visit (INDEPENDENT_AMBULATORY_CARE_PROVIDER_SITE_OTHER): Payer: Medicare Other | Admitting: Pulmonary Disease

## 2020-02-05 ENCOUNTER — Encounter: Payer: Self-pay | Admitting: Pulmonary Disease

## 2020-02-05 DIAGNOSIS — J479 Bronchiectasis, uncomplicated: Secondary | ICD-10-CM

## 2020-02-05 DIAGNOSIS — R05 Cough: Secondary | ICD-10-CM | POA: Diagnosis not present

## 2020-02-05 DIAGNOSIS — A310 Pulmonary mycobacterial infection: Secondary | ICD-10-CM

## 2020-02-05 MED ORDER — AZITHROMYCIN 250 MG PO TABS: ORAL_TABLET | ORAL | 0 refills | Status: DC

## 2020-02-05 NOTE — Progress Notes (Signed)
Virtual Visit via Telephone Note  I connected with Diane Haney on 02/05/20 at  9:30 AM EST by telephone and verified that I am speaking with the correct person using two identifiers.  Location: Patient: Home Provider: [facility] - [address], [place], [place], [place] [address]   I discussed the limitations, risks, security and privacy concerns of performing an evaluation and management service by telephone and the availability of in person appointments. I also discussed with the patient that there may be a patient responsible charge related to this service. The patient expressed understanding and agreed to proceed.  Patient consented to consult via telephone: Yes People present and their role in pt care: Pt   History of Present Illness:  74 year old female former smoker followed in our office for bronchiectasis, chronic cough  Past medical history: MAI, GERD, insomnia, chronic fatigue, Raynaud's syndrome, history of DVT at age 56, stage I moderately differentiated invasive ductal carcinoma of the right breast status post radiation treatment Smoking history: Former smoker.  Quit 1987.  25-pack-year smoking history Maintenance: none Patient of Dr. Elsworth Soho  Chief complaint: Cough  74 year old female former smoker scheduled for an acute visit today due to 3 days of increased cough and congestion.  Patient also has mildly elevated temperatures.  Patient with known history of bronchiectasis.  Patient has a past medical history of MAI.  She has been followed by infectious disease for this.  She has completed triple therapy treatment for MAI 3 times.  She was last seen by the infectious disease in 2016.  Patient is having a nonproductive cough.  This is typical for her unfortunately.  Last sputum samples were from 2015 with a bronchoscopy.  Negative AFB, fungal and respiratory sputum.  Most recent CT imaging was December/2020 that was consistent with MAI, bronchiectasis.   Previous high-resolution CT chest in 2019 did not show any interstitial lung disease.  Last note from Dr. Johnnye Sima (Infectious Disease) seen and chart was from 2016.  That information is listed below:  74 yo F with hx endoscopy in May 2005 which grew MAI (S- clarithro, eth, RIF 8.0, synergy positive for E/R) and fortuitum (S- cipro/smikacin/tigecycline).She began antibiotics May 2005 and was treated with ETH/Azithro and cipro until March 2007. Did well until July 2007 when she had a CT scan which showed some "activation" of MAI.  She was restarted on her MAI therapy in Nov 2008. CT scan 7-09 " 1. Stable mild lingular bronchiectasis. Stable tiny associated left lung nodules, which are consistent with a postinflammatory  etiology. 2. No active disease." Seen in ID November of 2009, was doing well and was taken off meds for MAI.  Seen November 2011 and felt like her "MAC is back". She had f/u CT 10-20-10: 1. New tree-in-bud opacities in the right middle lobe since the  prior CT from July, 2009, consistent with MAI. 2. Stable scarring, bronchiectasis, and tree-in-bud opacities in the inferior left upper lobe adjacent to the major fissure. Stable scar and bronchiectasis medially in the right middle lobe. No new pulmonary parenchymal abnormalities elsewhere.  3. Stable hyperinflation consistent with COPD and/or asthma. 4. No significant lymphadenopathy.  She was restarted on MAI rx- Azithro/ETH/Rif.  She has been dx with breast cancer (BRCA-, resected october 2013) and has received XRT/tamoxifen for 5 years. Now on letrizole.  As part of her f/u she underwent CT (06-19-13): Bilateral areas of bronchiectasis and peripheral tree in bud nodularity are identified. Findings are consistent with indolent  atypical infection such as MAI. When compared with the previous exam there is been mild disease progression throughout the right lung.  She was restarted on her E/A/R in August of 2014.  She had repeat CT  (01-2014): 1. Pulmonary parenchymal pattern of right middle lobe and lingular predominant bronchiectasis, mild volume loss and nodularity is indicative of mycobacterium avium complex (MAC), without interval progression. 2. Presumed subpleural radiation fibrosis in the anterior right hemi-thorax. 3. Left anterior descending coronary artery calcification. She underwent repeat BAL on 05-07-14: Cx all (-), her cytology was (-) as well. She was seen in ID f/u 01-18-15 and was therapy was stopped (she was at 18 months of therapy).   She had f/u CT of chest showing: 1. Interval slight progression in right apical consolidation with air bronchograms, probably progression of radiation fibrosis. The left apical component is unchanged. 2. Otherwise stable chronic lung disease with bronchiectasis, scarring and peribronchial nodularity consistent with chronic atypical mycobacterial infection. 3. No evidence of metastatic disease or acute process.  Feels "pretty good", has less stamina, maybe coughing a little more (non-productive). SOB more easily. Having chills (has had for several months, attributes to changes in ambient temp).  Feels like since off anbx her stomach has improved.   Patient was last seen in our office in January/2021 by TP NP.  At that time it was a stable interval for the patient.  She was recommended to continue using her therapy vest, flutter valve 3 times a day as well as hypertonic saline.  Patient is also been completely vaccinated for COVID-19.  She received her second Covid vaccine a week ago.  Patient reports today that she continues to have a cough which is nonproductive.  She has intermittent temperatures most recent temperature was yesterday 100.2.  Observations/Objective:  02/04/20 - temp - 100.2  05/07/2014-AFB-negative 05/07/2014-fungal culture-negative 05/07/2014-respiratory BAL-negative  11/24/2019-CT chest without contrast-chronic changes of MAC as detailed above, no worrisome  pulmonary lesions or acute overlying pulmonary process, stable mild emphysematous changes, areas of bronchiectasis and chronic atelectasis  08/22/2018-CT chest high-res-no evidence of fibrotic interstitial lung disease, bronchiectasis, scattered mucoid impaction and fairly diffuse peribronchial vascular nodularity similar to prior exam and indicative of MAC, aortic arthrosclerosis  09/20/2017-pulmonary function test-FVC 2.71 (88% predicted), postbronchodilator ratio 76, postbronchodilator FEV1 2.17 (94% predicted)  May/2005-endoscopy in May 2005 which grew MAI (S- clarithro, eth, RIF 8.0, synergy positive for E/R) and fortuitum (S- cipro/smikacin/tigecycline).She began antibiotics May 2005 and was treated with ETH/Azithro and cipro until March 2007.  Social History   Tobacco Use  Smoking Status Former Smoker  . Packs/day: 1.00  . Years: 25.00  . Pack years: 25.00  . Types: Cigarettes  . Quit date: 12/11/1985  . Years since quitting: 34.1  Smokeless Tobacco Never Used  Tobacco Comment   pt does not smoke   Immunization History  Administered Date(s) Administered  . Influenza,inj,Quad PF,6+ Mos 08/25/2013, 07/29/2014, 09/04/2018  . Influenza-Unspecified 08/09/2016, 08/22/2017, 08/29/2019  . Pneumococcal Conjugate-13 11/14/2013  . Pneumococcal Polysaccharide-23 10/11/2006, 11/12/2012  . Td 12/12/2003, 09/08/2015  . Tdap 09/08/2015   Fully vaccinated for COVID-19  Assessment and Plan:  Discussion: Preferred antibiotic treatment given patient's past medical history would be a fluoroquinolone.  Unfortunately patient has allergies to ciprofloxacin.  She would not like to try Levaquin at this point in time.  She would prefer to take azithromycin.  We will prescribe azithromycin today.  I cautioned the patient that we cannot recurrently do this as if she develops a resistance  to azithromycin will be very difficult for [place] to manage her bronchiectasis and MAI appropriately.  Will order chest  x-ray today.  We will also schedule close follow-up in office to decide if a bronchoscopy would be appropriate given the fact the patient has worsening symptoms and is unable to bring up any mucus.  Last sputum samples were 2015.   BRONCHIECTASIS Likely bronchiectasis flare today 3 days of worsening cough, lung pain, intermittent low-grade fevers Patient unable to bring up any mucus Patient with past medical history of MAI, status post 4 rounds of triple therapy treatment, previously managed by Dr. Johnnye Sima with infectious disease Patient reports adherence to therapy vest, flutter valve as well as hypertonic saline nebs  Plan: We will treat with azithromycin Chest x-ray today Close follow-up with our office in person with Dr. Elsworth Soho May need to consider bronchoscopy in the future May need to send back to infectious disease  Mycobacterium avium-intracellulare infection [facility]) Patient with known history of MAI Spectrum of findings on CT imaging favors MAI 2015 BAL, AFB and fungal negative Patient has had 4 rounds of triple therapy treatment in the past Patient was previously followed by Dr. Johnnye Sima with infectious disease  Patient with 3 days of fever, lung pain, congestion Patient reports adherence to flutter valve, therapy vest and hypertonic saline nebs  Plan: Chest x-ray today Z-Pak today Close follow-up with our office May need to consider repeat bronchoscopy  Follow Up Instructions:  Return in about 2 weeks (around 02/19/2020), or if symptoms worsen or fail to improve, for Follow up with Dr. Elsworth Soho.   I discussed the assessment and treatment plan with the patient. The patient was provided an opportunity to ask questions and all were answered. The patient agreed with the plan and demonstrated an understanding of the instructions.   The patient was advised to call back or seek an in-person evaluation if the symptoms worsen or if the condition fails to improve as anticipated.  I  provided 26 minutes of non-face-to-face time during this encounter.   Lauraine Rinne, NP

## 2020-02-05 NOTE — Assessment & Plan Note (Signed)
Patient with known history of MAI Spectrum of findings on CT imaging favors MAI 2015 BAL, AFB and fungal negative Patient has had 4 rounds of triple therapy treatment in the past Patient was previously followed by Dr. Johnnye Sima with infectious disease  Patient with 3 days of fever, lung pain, congestion Patient reports adherence to flutter valve, therapy vest and hypertonic saline nebs  Plan: Chest x-ray today Z-Pak today Close follow-up with our office May need to consider repeat bronchoscopy

## 2020-02-05 NOTE — Patient Instructions (Addendum)
You were seen today by Lauraine Rinne, NP  for:   1. Bronchiectasis with acute exacerbation (HCC)  - azithromycin (ZITHROMAX) 250 MG tablet; 500mg  (two tablets) today, then 250mg  (1 tablet) for the next 4 days  Dispense: 6 tablet; Refill: 0 - DG Chest 2 View; Future  Bronchiectasis: This is the medical term which indicates that you have damage, dilated airways making you more susceptible to respiratory infection. Use a flutter valve 10 breaths three times a day or 4 to 5 breaths 4-5 times a day to help clear mucus out Use Hypertonic saline nebs as prescribed  Use therapy vest as prescribed Let us know if you have cough with change in mucus color or fevers or chills.  At that point you would need an antibiotic. Maintain a healthy nutritious diet, eating whole foods Take your medications as prescribed    2. Mycobacterium avium-intracellulare infection ([place])  - azithromycin (ZITHROMAX) 250 MG tablet; 500mg  (two tablets) today, then 250mg  (1 tablet) for the next 4 days  Dispense: 6 tablet; Refill: 0 - DG Chest 2 View; Future  We may need to consider a bronchoscopy in the future to further evaluate your acute findings today since you are unable to cough up any mucus for [place] to sample  We will schedule close follow-up with our office We recommend today:  Orders Placed This Encounter  Procedures  . DG Chest 2 View    Standing Status:   Future    Number of Occurrences:   1    Standing Expiration Date:   04/04/2021    Order Specific Question:   Reason for Exam (SYMPTOM  OR DIAGNOSIS REQUIRED)    Answer:   hx MAI, cough    Order Specific Question:   Preferred imaging location?    Answer:   [facility] Specific Question:   Radiology Contrast Protocol - do NOT remove file path    Answer:   [email or web address]   Orders Placed This Encounter  Procedures  . DG Chest 2 View   Meds ordered this encounter  Medications  . azithromycin  (ZITHROMAX) 250 MG tablet    Sig: 500mg  (two tablets) today, then 250mg  (1 tablet) for the next 4 days    Dispense:  6 tablet    Refill:  0    Follow Up:    Return in about 2 weeks (around 02/19/2020), or if symptoms worsen or fail to improve, for Follow up with Dr. Elsworth Soho.   Please do your part to reduce the spread of COVID-19:      Reduce your risk of any infection  and COVID19 by using the similar precautions used for avoiding the common cold or flu:  Marland Kitchen Wash your hands often with soap and warm water for at least 20 seconds.  If soap and water are not readily available, use an alcohol-based hand sanitizer with at least 60% alcohol.  . If coughing or sneezing, cover your mouth and nose by coughing or sneezing into the elbow areas of your shirt or coat, into a tissue or into your sleeve (not your hands). Langley Gauss A MASK when in public  . Avoid shaking hands with others and consider head nods or verbal greetings only. . Avoid touching your eyes, nose, or mouth with unwashed hands.  . Avoid close contact with people who are sick. . Avoid places or events with large numbers of people in one location, like concerts or sporting events. . If you have  some symptoms but not all symptoms, continue to monitor at home and seek medical attention if your symptoms worsen. . If you are having a medical emergency, call 911.   [place] / e-Visit: [email or web address]         [facility]: [facility]: [id number]                   [facility]: [id number]     It is flu season:   >>> Best ways to protect herself from the flu: Receive the yearly flu vaccine, practice good hand hygiene washing with soap and also using hand sanitizer when available, eat a nutritious meals, get adequate rest, hydrate appropriately   Please contact the office if  your symptoms worsen or you have concerns that you are not improving.   Thank you for choosing [facility] for your healthcare, and for allowing [place] to partner with you on your healthcare journey. I am thankful to be able to provide care to you today.   Wyn Quaker FNP-C

## 2020-02-05 NOTE — Progress Notes (Signed)
Discussed results with patient. Nothing further is needed at this time.  Wyn Quaker FNP

## 2020-02-05 NOTE — Assessment & Plan Note (Signed)
Likely bronchiectasis flare today 3 days of worsening cough, lung pain, intermittent low-grade fevers Patient unable to bring up any mucus Patient with past medical history of MAI, status post 4 rounds of triple therapy treatment, previously managed by Dr. Johnnye Sima with infectious disease Patient reports adherence to therapy vest, flutter valve as well as hypertonic saline nebs  Plan: We will treat with azithromycin Chest x-ray today Close follow-up with our office in person with Dr. Elsworth Soho May need to consider bronchoscopy in the future May need to send back to infectious disease

## 2020-02-06 NOTE — Telephone Encounter (Signed)
Vaccines are very effective at preventing severe disease But there is still a 5 to 10% chance So in the perfect world, advise for her would be not to travel -unless numbers are significantly decreased by May

## 2020-02-09 ENCOUNTER — Telehealth: Payer: Self-pay | Admitting: Pulmonary Disease

## 2020-02-09 ENCOUNTER — Encounter: Payer: Self-pay | Admitting: Pulmonary Disease

## 2020-02-09 NOTE — Telephone Encounter (Signed)
02/09/2020  Reach patient by telephone today.  Patient reports that she is slowly starting to feel better status post antibiotic treatment.  She finished her last antibiotic dose today.  Fevers resolved on 02/06/2020.  She is still fatigued.  She does feel she is slowly getting back to baseline.  I explained that Dr. Elsworth Soho has been updated regarding her work-up, recent x-ray and he looks forward to evaluating her in clinic on 02/19/2020.  Patient reports no further questions or concerns.  She knows to contact our office with any acute or worsening symptoms.  Wyn Quaker, FNP

## 2020-02-13 ENCOUNTER — Other Ambulatory Visit: Payer: Self-pay

## 2020-02-13 ENCOUNTER — Ambulatory Visit (INDEPENDENT_AMBULATORY_CARE_PROVIDER_SITE_OTHER): Payer: Medicare Other | Admitting: *Deleted

## 2020-02-13 DIAGNOSIS — M810 Age-related osteoporosis without current pathological fracture: Secondary | ICD-10-CM | POA: Diagnosis not present

## 2020-02-13 MED ORDER — DENOSUMAB 60 MG/ML ~~LOC~~ SOSY
60.0000 mg | PREFILLED_SYRINGE | Freq: Once | SUBCUTANEOUS | Status: AC
  Administered 2020-02-13: 60 mg via SUBCUTANEOUS

## 2020-02-13 NOTE — Progress Notes (Signed)
Pls cosign for prolia inj../lmb 

## 2020-02-19 ENCOUNTER — Ambulatory Visit: Payer: Medicare Other | Admitting: Pulmonary Disease

## 2020-03-03 ENCOUNTER — Ambulatory Visit (INDEPENDENT_AMBULATORY_CARE_PROVIDER_SITE_OTHER): Payer: Medicare Other | Admitting: Pulmonary Disease

## 2020-03-03 ENCOUNTER — Encounter: Payer: Self-pay | Admitting: Pulmonary Disease

## 2020-03-03 ENCOUNTER — Other Ambulatory Visit: Payer: Self-pay

## 2020-03-03 ENCOUNTER — Ambulatory Visit (INDEPENDENT_AMBULATORY_CARE_PROVIDER_SITE_OTHER): Payer: Medicare Other

## 2020-03-03 DIAGNOSIS — A310 Pulmonary mycobacterial infection: Secondary | ICD-10-CM

## 2020-03-03 DIAGNOSIS — J471 Bronchiectasis with (acute) exacerbation: Secondary | ICD-10-CM

## 2020-03-03 DIAGNOSIS — J479 Bronchiectasis, uncomplicated: Secondary | ICD-10-CM

## 2020-03-03 NOTE — Patient Instructions (Signed)
Chest x-ray today  We will get you a specimen cup for sputum sample for culture/AFB  Continue airway clearance measures with vest/saline nebs  Call me for fever or productive phlegm Schedule PFTs -spirometry and DLCO only, for next visit in 3 months

## 2020-03-03 NOTE — Assessment & Plan Note (Addendum)
No objective evidence of worsening -recheck lung function but this has remained stable, symptomatically she is stable and CT imaging wise bronchiectasis has not gotten worse, nodular infiltrates have been fleeting without any clear progression. In fact last CT from December 2020 appears much improved  Approach we will take here is to have some clear signs of worsening before proceeding with invasive testing such as bronchoscopy Next CT for 6 to 12 months depending on clinical progress Addendum-chest x-ray from today personally reviewed Nodular density over right lower lobe is unchanged Repeat chest x-ray in 6 weeks-and if persistent we will proceed with CT scan

## 2020-03-03 NOTE — Assessment & Plan Note (Signed)
She improved after last exacerbation 2/21 but dry cough has now come back Chest x-ray today to follow-up on right lower lobe infiltrate noted 01/2020, platelike consolidation noted on lateral film was present before  We will get you a specimen cup for sputum sample for culture/AFB  Continue airway clearance measures with vest/saline nebs  Call me for fever or productive phlegm Schedule PFTs -spirometry and DLCO only, for next visit in 3 months

## 2020-03-03 NOTE — Progress Notes (Signed)
Subjective:    Patient ID: Diane Haney, female    DOB: [birth year], 74 y.o.   MRN: [id number]  HPI 74 yo ex-smoker with bronchiectasis and MAI  She smoked about 20 pack years before she quit in her early 39s  5/ 2005 Bronchjoscopy >> MAI (S- clarithro, eth, RIF 8.0, synergy positive for E/R) and fortuitum (S- cipro/smikacin/tigecycline).She began antibiotics May 2005 and was treated with ETH/Azithro and cipro until March 2007. Was seen by ID Johnnye Sima), for recurrent MAI with previous tx of MAI triple therapy rx.  Finished last course (57mo) in Feb 2016 .  PMH -  stage I moderately differentiated invasive ductal carcinoma of the right breasts/p RT.  Idiopathic LLE DVT (Dx at age 74   Chief Complaint  Patient presents with  . Follow-up    Patient is here for follow up. Had Bronchiectasis flare and was treated with azithromycin. Patient states the antibiotic helped but cough has come back. Patient is still not coughing up any mucus. Patient is also having fatigue and shortness of breath with exertion.    Review of telemetry visit 2/21 where she had low-grade fever 100 and cough, chest x-ray showed right lower lobe infiltrate, I personally reviewed and platelike consolidation on lateral view which was expressed by radiologist was present on prior chest x-rays. She improved with Z-Pak but dry cough is slowly returned.  No more fevers. She has received her Covid shots, overall health is good, no loss of appetite or weight loss  She is good about using airway clearance measures with vest and saline nebs  Significant tests/ events reviewed  11/24/2019-CT chest without contrast-chronic changes of MAC as detailed above, no worrisome pulmonary lesions or acute overlying pulmonary process, stable mild emphysematous changes, areas of bronchiectasis and chronic atelectasis, larger areas of nodularity in the right lung seen on the prior study have resolved and there are new areas of vague  nodularity.  CT chest 08/2019 chronic changes , Several new large nodules are identified predominantly within the right lung measuring up to 1.6 cm. Likely post infectious or inflammatory in etiology  HRCT Chest 08/2018 >>stable compared to 2018  CT 01/2015>Interval slight progression in right apical consolidation with air bronchograms, probably progression of radiation fibrosis. The left apical component is unchanged.  - stable chronic lung disease with bronchiectasis,scarring and peribronchial nodularity     10/2018PFT-stable with FEV1 94% , ratio 76 , FVC 94%  2011 spirometry normal   Past Medical History:  Diagnosis Date  . [place]12[place]   [facility] duct[facility] carcinoma, s/o bilat mastectomy with + margin  - also for XRT soon  . Bronchiectasis   . Chronic fatigue fibromyalgia syndrome 11/12/2012  . COPD (chronic obstructive pulmonary disease) ([place]   . DVT, lower extremity (HCC)    LLE  . Fibromyalgia   . GERD (gastroesophageal reflux disease)   . History of shingles   . Hyperlipidemia   . Hypertension   . Impaired glucose tolerance 11/14/2013  . Migraine   . Osteopenia   . Osteoporosis 10/16/2007   Qualifier: Diagnosis of  By: JJenny ReichmannMD, JHunt Oris  . Pulmonary Mycobacterium avium complex (MAC) infection ([place]   . Squamous cell skin cancer, multiple sites      Review of Systems Patient denies significant dyspnea,cough, hemoptysis,  chest pain, palpitations, pedal edema, orthopnea, paroxysmal nocturnal dyspnea, lightheadedness, nausea, vomiting, abdominal or  leg pains  Objective:   Physical Exam   Gen. Pleasant, well-nourished, in no distress ENT - no thrush, no pallor/icterus,no post nasal drip Neck: No JVD, no thyromegaly, no carotid bruits Lungs: no use of accessory muscles, no dullness to percussion,RLL rales, no rhonchi  Cardiovascular: Rhythm regular, heart sounds  normal, no murmurs or gallops,  no peripheral edema Musculoskeletal: No deformities, no cyanosis or clubbing        Assessment & Plan:

## 2020-03-04 DIAGNOSIS — H02052 Trichiasis without entropian right lower eyelid: Secondary | ICD-10-CM | POA: Diagnosis not present

## 2020-03-04 DIAGNOSIS — H16223 Keratoconjunctivitis sicca, not specified as Sjogren's, bilateral: Secondary | ICD-10-CM | POA: Diagnosis not present

## 2020-03-18 ENCOUNTER — Ambulatory Visit: Payer: Medicare Other | Admitting: Adult Health

## 2020-04-12 ENCOUNTER — Ambulatory Visit (INDEPENDENT_AMBULATORY_CARE_PROVIDER_SITE_OTHER): Payer: Medicare Other

## 2020-04-12 DIAGNOSIS — J479 Bronchiectasis, uncomplicated: Secondary | ICD-10-CM | POA: Diagnosis not present

## 2020-04-12 DIAGNOSIS — J471 Bronchiectasis with (acute) exacerbation: Secondary | ICD-10-CM | POA: Diagnosis not present

## 2020-04-20 NOTE — Telephone Encounter (Signed)
Pt sent mychart message requesting to know the results of the cxr which was performed 5/3. Dr. Elsworth Soho, please advise.

## 2020-04-20 NOTE — Telephone Encounter (Signed)
Chest x-ray from 5/3 was reviewed-the bandlike opacity noted on lateral film in March has now decreased in size and become fainter and improved This is related to her known bronchiectasis -do not suspect any superimposed infection

## 2020-05-31 ENCOUNTER — Other Ambulatory Visit (HOSPITAL_COMMUNITY)
Admission: RE | Admit: 2020-05-31 | Discharge: 2020-05-31 | Disposition: A | Discharge status: Home / Self Care | Payer: Medicare Other | Source: Ambulatory Visit | Attending: Adult Health | Admitting: Adult Health

## 2020-05-31 DIAGNOSIS — Z20822 Contact with and (suspected) exposure to covid-19: Secondary | ICD-10-CM | POA: Insufficient documentation

## 2020-05-31 DIAGNOSIS — Z01812 Encounter for preprocedural laboratory examination: Secondary | ICD-10-CM | POA: Diagnosis not present

## 2020-05-31 LAB — SARS CORONAVIRUS 2 (TAT 6-24 HRS): SARS Coronavirus 2: NEGATIVE

## 2020-06-03 ENCOUNTER — Ambulatory Visit (INDEPENDENT_AMBULATORY_CARE_PROVIDER_SITE_OTHER): Payer: Medicare Other | Admitting: Primary Care

## 2020-06-03 ENCOUNTER — Other Ambulatory Visit: Payer: Self-pay

## 2020-06-03 ENCOUNTER — Encounter: Payer: Self-pay | Admitting: Primary Care

## 2020-06-03 ENCOUNTER — Ambulatory Visit: Payer: Medicare Other | Admitting: Adult Health

## 2020-06-03 ENCOUNTER — Ambulatory Visit (INDEPENDENT_AMBULATORY_CARE_PROVIDER_SITE_OTHER): Payer: Medicare Other | Admitting: Pulmonary Disease

## 2020-06-03 VITALS — BP 118/78 | HR 78 | Temp 97.8°F | Ht 61.0 in | Wt 114.0 lb

## 2020-06-03 DIAGNOSIS — J479 Bronchiectasis, uncomplicated: Secondary | ICD-10-CM | POA: Diagnosis not present

## 2020-06-03 DIAGNOSIS — A310 Pulmonary mycobacterial infection: Secondary | ICD-10-CM

## 2020-06-03 LAB — PULMONARY FUNCTION TEST
DL/VA % pred: 97 %
DL/VA: 3.96 ml/min/mmHg/L
DLCO cor % pred: 74 %
DLCO cor: 15.35 ml/min/mmHg
DLCO unc % pred: 74 %
DLCO unc: 15.35 ml/min/mmHg
FEF 25-75 Pre: 1.37 L/sec
FEF2575-%Pred-Pre: 74 %
FEV1-%Pred-Pre: 77 %
FEV1-Pre: 1.81 L
FEV1FVC-%Pred-Pre: 97 %
FEV6-%Pred-Pre: 83 %
FEV6-Pre: 2.48 L
FEV6FVC-%Pred-Pre: 104 %
FVC-%Pred-Pre: 79 %
FVC-Pre: 2.48 L
Pre FEV1/FVC ratio: 73 %
Pre FEV6/FVC Ratio: 100 %

## 2020-06-03 NOTE — Patient Instructions (Signed)
PFTs showed mild restriction in lung function and mild diffusion defect - mostly stable compared to 2018  Recommendations: Continue hypertonic saline nebulizer- try using twice a day Continue therapy vest twice daily  If you develop increased mucus production or fever please notify office   Follow-up: 3 months with Dr. Elsworth Soho    Bronchiectasis  Bronchiectasis is a condition in which the airways in the lungs (bronchi) are damaged and widened. The condition makes it hard for the lungs to get rid of mucus, and it causes mucus to gather in the bronchi. This condition often leads to lung infections, which can make the condition worse. What are the causes? You can be born with this condition or you can develop it later in life. Common causes of this condition include:  Cystic fibrosis.  Repeated lung infections, such as pneumonia or tuberculosis.  An object or other blockage in the lungs.  Breathing in fluid, food, or other objects (aspiration).  A problem with the immune system and lung structure that is present at birth (congenital). Sometimes the cause is not known. What are the signs or symptoms? Common symptoms of this condition include:  A daily cough that brings up mucus and lasts for more than 3 weeks.  Lung infections that happen often.  Shortness of breath and wheezing.  Weakness and fatigue. How is this diagnosed? This condition is diagnosed with tests, such as:  Chest X-rays or CT scans. These are done to check for changes in the lungs.  Breathing tests. These are done to check how well your lungs are working.  A test of a sample of your saliva (sputum culture). This test is done to check for infection.  Blood tests and other tests. These are done to check for related diseases or causes. How is this treated? Treatment for this condition depends on the severity of the illness and its cause. Treatment may include:  Medicines that loosen mucus so it can be coughed  up (expectorants).  Medicines that relax the muscles of the bronchi (bronchodilators).  Antibiotic medicines to prevent or treat infection.  Physical therapy to help clear mucus from the lungs. Techniques may include: ? Postural drainage. This is when you sit or lie in certain positions so that mucus can drain by gravity. ? Chest percussion. This involves tapping the chest or back with a cupped hand. ? Chest vibration. For this therapy, a hand or special equipment vibrates your chest and back.  Surgery to remove the affected part of the lung. This may be done in severe cases. Follow these instructions at home: Medicines  Take over-the-counter and prescription medicines only as told by your health care provider.  If you were prescribed an antibiotic medicine, take it as told by your health care provider. Do not stop taking the antibiotic even if you start to feel better.  Avoid taking sedatives and antihistamines unless your health care provider tells you to take them. These medicines tend to thicken the mucus in the lungs. Managing symptoms  Perform breathing exercises or techniques to clear your lungs as told by your health care provider.  Consider using a cold steam vaporizer or humidifier in your room or home to help loosen secretions.  If you have a cough that gets worse at night, try sleeping in a semi-upright position. General instructions  Get plenty of rest.  Drink enough fluid to keep your urine clear or pale yellow.  Stay inside when pollution and ozone levels are high.  Stay up  to date with vaccinations and immunizations.  Avoid cigarette smoke and other lung irritants.  Do not use any products that contain nicotine or tobacco, such as cigarettes and e-cigarettes. If you need help quitting, ask your health care provider.  Keep all follow-up visits as told by your health care provider. This is important. Contact a health care provider if:  You cough up more  sputum than before and the sputum is yellow or green in color.  You have a fever.  You cannot control your cough and are losing sleep. Get help right away if:  You cough up blood.  You have chest pain.  You have increasing shortness of breath.  You have pain that gets worse or is not controlled with medicines.  You have a fever and your symptoms suddenly get worse. Summary  Bronchiectasis is a condition in which the airways in the lungs (bronchi) are damaged and widened. The condition makes it hard for the lungs to get rid of mucus, and it causes mucus to gather in the bronchi.  Treatment usually includes therapy to help clear mucus from the lungs.  Stay up to date with vaccinations and immunizations. This information is not intended to replace advice given to you by your health care provider. Make sure you discuss any questions you have with your health care provider. Document Revised: 11/09/2017 Document Reviewed: 01/01/2017 Elsevier Patient Education  2020 [employer].

## 2020-06-03 NOTE — Progress Notes (Signed)
Spirometry/ DLCO completed today  ?

## 2020-06-03 NOTE — Assessment & Plan Note (Addendum)
-   Mostly stable interval, reports mild increase in fatigue and shortness of breath. Cough continues to be fairly non-productive. She is compliant with hypertonic saline nebulizer's and therapy vest. She has been able to produce a sputum sample. - PFTs today showed mild restriction with mild diffusion defect/ FEV1 1.81 (77%), ratio 73, DLCO 15.53 (74%) - Continue to monitor symptoms, patient to notify office if she has increased mucus production or fever

## 2020-06-03 NOTE — Progress Notes (Signed)
_0  ID: Diane Haney, female    DOB: Jul 30, 1946, 74 y.o.   MRN: [id number]  Chief Complaint  Patient presents with  . Follow-up    pt still having sob and couldn't produce sputum specimen. pt is here to discuss pft and spriometry    Referring provider: Biagio Borg, MD  HPI: 74 year old female, former smoker quit in 1987 (20 year hx). PMH significant for breast cancer, LLE DVT, bronchiectasis, recurrent MAI previously treated with triple therapy/ Last finished in Feb 2016. Patient of Dr. Elsworth Soho, last seen on 03/03/20. Maintained on hypertonic saline nebulizer's and therapy vest.   06/03/2020 Reports that her fatigue is a little worse. States that she has had shortness of breath for long times. She has noticed it a little bit more recently but its not significant worse. She still have a cough, it may loosen but she still have difficulty coughing mucus up. She uses hypertonic saline once in the morning and uses her vest twice a day. Coughing spell doesn't usually occur til the afternoon. In May she coughed up a little bead of mucus once which was yellow. She was not able to produce a sputum sample. Denies fever, chest pain.   Significant tests/ events reviewed  11/24/2019-CT chest without contrast-chronic changes of MAC as detailed above, no worrisome pulmonary lesions or acute overlying pulmonary process, stable mild emphysematous changes, areas of bronchiectasis and chronic atelectasis,larger areas of nodularity in the right lung seen on the prior study have resolved and there are new areas of vague nodularity.  CT chest 9/2020chronic changes,Several new large nodules are identified predominantly within the right lung measuring up to 1.6 cm. Likely post infectious or inflammatory in etiology  HRCT Chest 08/2018 >>stable compared to 2018  CT 01/2015>Interval slight progression in right apical consolidation with air bronchograms, probably progression of radiation fibrosis. The  left apical component is unchanged.  Pulmonary function testing:  06/03/2020 FVC 2.48 (79%), FEV1 1.81 (77%), ratio 73, DLCO 15.53 (74%)  10/2018PFT-stable with FEV1 94% , ratio 76 , FVC 94%  2011 spirometry normal   Allergies  Allergen Reactions  . Losartan     headache  . Adhesive [Tape]     Blisters  . Ciprofloxacin     REACTION: tendinitis  . Clarithromycin     REACTION: severe reflux  . Prednisone     REACTION: irregular heartbeat, not able to sleep    Immunization History  Administered Date(s) Administered  . Fluad Quad(high Dose 65+) 08/29/2019  . Influenza,inj,Quad PF,6+ Mos 08/25/2013, 07/29/2014, 09/04/2018  . Influenza-Unspecified 08/09/2016, 08/22/2017, 08/29/2019  . PFIZER SARS-COV-2 Vaccination 01/07/2020, 01/28/2020  . Pneumococcal Conjugate-13 11/14/2013  . Pneumococcal Polysaccharide-23 10/11/2006, 11/12/2012  . Td 12/12/2003, 09/08/2015  . Tdap 09/08/2015    Past Medical History:  Diagnosis Date  . Breast cancer ([place]) 11/12/2012   Dx oct 2013 - [facility] [facility] - Invasive ductal carcinoma, s/o bilat mastectomy with + margin  - also for XRT soon  . Bronchiectasis   . Chronic fatigue fibromyalgia syndrome 11/12/2012  . COPD (chronic obstructive pulmonary disease) ([place])   . DVT, lower extremity ([facility])    LLE  . Fibromyalgia   . GERD (gastroesophageal reflux disease)   . History of shingles   . Hyperlipidemia   . Hypertension   . Impaired glucose tolerance 11/14/2013  . Migraine   . Osteopenia   . Osteoporosis 10/16/2007   Qualifier: Diagnosis of  By: Jenny Reichmann MD, Hunt Oris   . Pulmonary  Mycobacterium avium complex (MAC) infection ([place])   . Squamous cell skin cancer, multiple sites     Tobacco History: Social History   Tobacco Use  Smoking Status Former Smoker  . Packs/day: 1.00  . Years: 25.00  . Pack years: 25.00  . Types: Cigarettes  . Quit date: 12/11/1985  . Years since quitting: 34.5  Smokeless Tobacco Never Used  Tobacco  Comment   pt does not smoke   Counseling given: Not Answered Comment: pt does not smoke   Outpatient Medications Prior to Visit  Medication Sig Dispense Refill  . amLODipine (NORVASC) 5 MG tablet Take 1 tablet (5 mg total) by mouth daily. 90 tablet 3  . aspirin 81 MG EC tablet Take 81 mg by mouth daily.      Marland Kitchen atorvastatin (LIPITOR) 20 MG tablet Take 1 tablet (20 mg total) by mouth daily. 90 tablet 3  . calcium citrate-vitamin D (CITRACAL+D) 315-200 MG-UNIT per tablet Take 2 tablets by mouth daily.     . carisoprodol (SOMA) 350 MG tablet Take 1 tablet (350 mg total) by mouth 2 (two) times daily as needed for muscle spasms. 180 tablet 1  . diclofenac sodium (VOLTAREN) 1 % GEL Apply 2 g topically 4 (four) times daily as needed. 100 g 5  . famotidine (PEPCID) 20 MG tablet     . gabapentin (NEURONTIN) 100 MG capsule Take 1 capsule (100 mg total) by mouth 3 (three) times daily. 270 capsule 1  . olmesartan-hydrochlorothiazide (BENICAR HCT) 20-12.5 MG tablet Take 0.5 tablets by mouth daily. 45 tablet 3  . Respiratory Therapy Supplies (FLUTTER) DEVI Use as directed. 1 each 0  . sodium chloride HYPERTONIC 3 % nebulizer solution Take by nebulization daily. Once daily Dx J47.9 60 mL 11  . temazepam (RESTORIL) 15 MG capsule TAKE 1 OR 2 CAPSULES BY MOUTH EVERY NIGHT AT BEDTIME AS NEEDED FOR SLEEP 180 capsule 1  . metoprolol tartrate (LOPRESSOR) 25 MG tablet Take 0.5 tablets (12.5 mg total) by mouth 2 (two) times daily. Pt taking .5 tab 90 tablet 3   No facility-administered medications prior to visit.   Review of Systems  Review of Systems  Constitutional: Positive for fatigue.  HENT: Negative.   Respiratory: Positive for cough and shortness of breath. Negative for chest tightness and wheezing.   Cardiovascular: Negative.    Physical Exam  BP 118/78 (BP Location: Left Arm, Cuff Size: Normal)   Pulse 78   Temp 97.8 F (36.6 C) (Oral)   Ht _0  (1.549 m)   Wt 114 lb (51.7 kg)   SpO2 97%    BMI 21.54 kg/m  Physical Exam Constitutional:      General: She is not in acute distress.    Appearance: Normal appearance. She is normal weight. She is not ill-appearing.  Cardiovascular:     Rate and Rhythm: Normal rate and regular rhythm.  Pulmonary:     Effort: Pulmonary effort is normal. No respiratory distress.     Breath sounds: No wheezing or rhonchi.     Comments: Fine rales bases Neurological:     General: No focal deficit present.     Mental Status: She is alert and oriented to person, place, and time. Mental status is at baseline.  Psychiatric:        Mood and Affect: Mood normal.        Behavior: Behavior normal.        Thought Content: Thought content normal.  Judgment: Judgment normal.      Lab Results:  CBC    Component Value Date/Time   WBC 7.5 12/02/2019 1132   RBC 4.56 12/02/2019 1132   HGB 14.6 12/02/2019 1132   HCT 44.0 12/02/2019 1132   PLT 243.0 12/02/2019 1132   MCV 96.4 12/02/2019 1132   MCH 32.8 04/16/2014 1136   MCHC 33.3 12/02/2019 1132   RDW 13.8 12/02/2019 1132   LYMPHSABS 1.2 12/02/2019 1132   MONOABS 0.8 12/02/2019 1132   EOSABS 0.1 12/02/2019 1132   BASOSABS 0.0 12/02/2019 1132    BMET    Component Value Date/Time   NA 138 12/02/2019 1132   K 3.7 12/02/2019 1132   CL 97 12/02/2019 1132   CO2 29 12/02/2019 1132   GLUCOSE 93 12/02/2019 1132   BUN 16 12/02/2019 1132   CREATININE 0.83 12/02/2019 1132   CALCIUM 10.2 12/02/2019 1132   GFRNONAA 75.40 10/31/2010 1119   GFRAA 93 09/24/2008 1044    BNP No results found for: BNP  ProBNP No results found for: PROBNP  Imaging: No results found.   Assessment & Plan:   BRONCHIECTASIS - Mostly stable interval, reports mild increase in fatigue and shortness of breath. Cough continues to be fairly non-productive. She is compliant with hypertonic saline nebulizer's and therapy vest. She has been able to produce a sputum sample. - PFTs today showed mild restriction with mild  diffusion defect/ FEV1 1.81 (77%), ratio 73, DLCO 15.53 (74%) - Continue to monitor symptoms, patient to notify office if she has increased mucus production or fever    Martyn Ehrich, NP 06/03/2020

## 2020-06-11 DIAGNOSIS — M19072 Primary osteoarthritis, left ankle and foot: Secondary | ICD-10-CM | POA: Diagnosis not present

## 2020-06-11 DIAGNOSIS — G609 Hereditary and idiopathic neuropathy, unspecified: Secondary | ICD-10-CM | POA: Diagnosis not present

## 2020-06-11 DIAGNOSIS — M85872 Other specified disorders of bone density and structure, left ankle and foot: Secondary | ICD-10-CM | POA: Diagnosis not present

## 2020-06-11 DIAGNOSIS — M25572 Pain in left ankle and joints of left foot: Secondary | ICD-10-CM | POA: Diagnosis not present

## 2020-06-30 ENCOUNTER — Encounter: Payer: Self-pay | Admitting: Internal Medicine

## 2020-07-06 DIAGNOSIS — Z85828 Personal history of other malignant neoplasm of skin: Secondary | ICD-10-CM | POA: Diagnosis not present

## 2020-07-06 DIAGNOSIS — L820 Inflamed seborrheic keratosis: Secondary | ICD-10-CM | POA: Diagnosis not present

## 2020-07-06 DIAGNOSIS — L570 Actinic keratosis: Secondary | ICD-10-CM | POA: Diagnosis not present

## 2020-07-06 DIAGNOSIS — L578 Other skin changes due to chronic exposure to nonionizing radiation: Secondary | ICD-10-CM | POA: Diagnosis not present

## 2020-08-03 DIAGNOSIS — H9312 Tinnitus, left ear: Secondary | ICD-10-CM | POA: Insufficient documentation

## 2020-08-03 DIAGNOSIS — H903 Sensorineural hearing loss, bilateral: Secondary | ICD-10-CM | POA: Diagnosis not present

## 2020-08-03 DIAGNOSIS — J383 Other diseases of vocal cords: Secondary | ICD-10-CM | POA: Diagnosis not present

## 2020-08-09 DIAGNOSIS — Z79899 Other long term (current) drug therapy: Secondary | ICD-10-CM | POA: Diagnosis not present

## 2020-08-09 DIAGNOSIS — Z170 Estrogen receptor positive status [ER+]: Secondary | ICD-10-CM | POA: Diagnosis not present

## 2020-08-09 DIAGNOSIS — M810 Age-related osteoporosis without current pathological fracture: Secondary | ICD-10-CM | POA: Diagnosis not present

## 2020-08-09 DIAGNOSIS — C50411 Malignant neoplasm of upper-outer quadrant of right female breast: Secondary | ICD-10-CM | POA: Diagnosis not present

## 2020-08-09 DIAGNOSIS — Z803 Family history of malignant neoplasm of breast: Secondary | ICD-10-CM | POA: Diagnosis not present

## 2020-08-09 DIAGNOSIS — Z853 Personal history of malignant neoplasm of breast: Secondary | ICD-10-CM | POA: Diagnosis not present

## 2020-08-09 DIAGNOSIS — Z9013 Acquired absence of bilateral breasts and nipples: Secondary | ICD-10-CM | POA: Diagnosis not present

## 2020-08-19 ENCOUNTER — Other Ambulatory Visit: Payer: Self-pay

## 2020-08-19 ENCOUNTER — Ambulatory Visit (INDEPENDENT_AMBULATORY_CARE_PROVIDER_SITE_OTHER): Payer: Medicare Other

## 2020-08-19 DIAGNOSIS — M810 Age-related osteoporosis without current pathological fracture: Secondary | ICD-10-CM

## 2020-08-19 MED ORDER — DENOSUMAB 60 MG/ML ~~LOC~~ SOSY
60.0000 mg | PREFILLED_SYRINGE | Freq: Once | SUBCUTANEOUS | Status: AC
  Administered 2020-08-19: 60 mg via SUBCUTANEOUS

## 2020-08-19 NOTE — Progress Notes (Signed)
Patient given prolia in left arm. Patient tolerated injection well/cjs

## 2020-08-20 NOTE — Progress Notes (Signed)
Patient ID: Diane Haney, female   DOB: 08-Mar-1946, 74 y.o.   MRN: [id number] Medical screening examination/treatment/procedure(s) were performed by non-physician practitioner and as supervising physician I was immediately available for consultation/collaboration. I agree with above. Cathlean Cower, MD

## 2020-09-03 ENCOUNTER — Ambulatory Visit (INDEPENDENT_AMBULATORY_CARE_PROVIDER_SITE_OTHER): Payer: Medicare Other | Admitting: Pulmonary Disease

## 2020-09-03 ENCOUNTER — Encounter: Payer: Self-pay | Admitting: Pulmonary Disease

## 2020-09-03 ENCOUNTER — Other Ambulatory Visit: Payer: Self-pay

## 2020-09-03 DIAGNOSIS — Z23 Encounter for immunization: Secondary | ICD-10-CM | POA: Diagnosis not present

## 2020-09-03 DIAGNOSIS — J479 Bronchiectasis, uncomplicated: Secondary | ICD-10-CM | POA: Diagnosis not present

## 2020-09-03 DIAGNOSIS — A310 Pulmonary mycobacterial infection: Secondary | ICD-10-CM

## 2020-09-03 NOTE — Assessment & Plan Note (Signed)
Again emphasized airway clearance measures including vest and hypertonic saline.  She is very compliant with these Okay to proceed with Covid booster shot

## 2020-09-03 NOTE — Progress Notes (Signed)
   Subjective:    Patient ID: Diane Haney, female    DOB: 12-14-1945, 74 y.o.   MRN: [id number]  HPI  62 yoex-smokerwith bronchiectasis and MAI  She smoked about 20 pack years before she quit in her early 63s  5/ 2005 Bronchjoscopy >> MAI (S- clarithro, eth, RIF 8.0, synergy positive for E/R) and fortuitum (S- cipro/smikacin/tigecycline).She began antibiotics May 2005 and was treated with ETH/Azithro and cipro until March 2007. Was seen by ID Johnnye Sima), for recurrent MAI with previous tx of MAI triple therapy rx.  Finished last course (57mo) in Feb 2016 .  PMH -  stage I moderately differentiated invasive ductal carcinoma of the right breasts/p RT.  Idiopathic LLE DVT (Dx at age 74   Chief Complaint  Patient presents with  . Follow-up    3 mo f/u. Stated she had an episode last week, fever and cough. Unable to produce enough for a sample. More fatigued and SOB.     01/2020 RLL pneumonia treated with Abx , clearing on follow-up chest x-ray which I independently reviewed 04/2020. We reviewed lung function testing from 05/2020  She reports chest tightness last week with low-grade fever 200 and a dry cough.  This lasted 3 days now back to normal dry cough seems to come on and off, never productive, unable to provide specimen Dyspnea is at baseline   Significant tests/ events reviewed  11/24/2019-CT chest without contrast-chronic changes of MAC,  stable mild emphysematous changes, areas of bronchiectasis and chronic atelectasis, larger areas of nodularity in the right lung seen on the prior study have resolved and there are new areas of vague nodularity.  CT chest 9/2020chronic changes , Several new large nodules are identified predominantly within the right lung measuring up to 1.6 cm. Likely post infectious or inflammatory in etiology  HRCT Chest 08/2018 >>stable compared to 2018  CT 01/2015>Interval slight progression in right apical consolidation with air  bronchograms, probably progression of radiation fibrosis. The left apical component is unchanged.  - stable chronic lung disease with bronchiectasis,scarring and peribronchial nodularity    05/2020 PFTs  mild restriction with mild diffusion defect/ FEV1 1.81 (77%), ratio 73, DLCO 15.53 (74%) >> drop in FEV1 from 2.16-1.81 and drop in FVC from 2.71-2.48 10/2018PFT-stable with FEV1 94% , ratio 76 , FVC 94%  2011 spirometry normal   Review of Systems neg for any significant sore throat, dysphagia, itching, sneezing, nasal congestion or excess/ purulent secretions, fever, chills, sweats, unintended wt loss, pleuritic or exertional cp, hempoptysis, orthopnea pnd or change in chronic leg swelling. Also denies presyncope, palpitations, heartburn, abdominal pain, nausea, vomiting, diarrhea or change in bowel or urinary habits, dysuria,hematuria, rash, arthralgias, visual complaints, headache, numbness weakness or ataxia.     Objective:   Physical Exam  Gen. Pleasant, thin woman, in no distress ENT - no thrush, no pallor/icterus,no post nasal drip Neck: No JVD, no thyromegaly, no carotid bruits Lungs: no use of accessory muscles, no dullness to percussion, clear without rales or rhonchi  Cardiovascular: Rhythm regular, heart sounds  normal, no murmurs or gallops, no peripheral edema Musculoskeletal: No deformities, no cyanosis or clubbing        Assessment & Plan:

## 2020-09-03 NOTE — Patient Instructions (Signed)
Lung function has dropped slightly compared to 2018. Repeat high-resolution CT chest noncontrast in December 2021.  Okay to proceed with getting booster Covid shot

## 2020-09-03 NOTE — Addendum Note (Signed)
Addended by: Valerie Salts on: 09/03/2020 11:05 AM   Modules accepted: Orders

## 2020-09-03 NOTE — Assessment & Plan Note (Signed)
Lung function has dropped slightly compared to 2018. Repeat high-resolution CT chest noncontrast in December 2021.  Only reason to proceed with bronchoscopy would be if we are considering treatment for MAC Last CT scan 11/2019 was reassuring that there is no progression

## 2020-09-06 DIAGNOSIS — Z961 Presence of intraocular lens: Secondary | ICD-10-CM | POA: Diagnosis not present

## 2020-09-06 DIAGNOSIS — H524 Presbyopia: Secondary | ICD-10-CM | POA: Diagnosis not present

## 2020-09-06 DIAGNOSIS — H02831 Dermatochalasis of right upper eyelid: Secondary | ICD-10-CM | POA: Diagnosis not present

## 2020-09-06 DIAGNOSIS — H5203 Hypermetropia, bilateral: Secondary | ICD-10-CM | POA: Diagnosis not present

## 2020-09-06 DIAGNOSIS — H02834 Dermatochalasis of left upper eyelid: Secondary | ICD-10-CM | POA: Diagnosis not present

## 2020-09-06 DIAGNOSIS — H26491 Other secondary cataract, right eye: Secondary | ICD-10-CM | POA: Diagnosis not present

## 2020-09-06 DIAGNOSIS — H52203 Unspecified astigmatism, bilateral: Secondary | ICD-10-CM | POA: Diagnosis not present

## 2020-09-06 DIAGNOSIS — H43813 Vitreous degeneration, bilateral: Secondary | ICD-10-CM | POA: Diagnosis not present

## 2020-09-06 DIAGNOSIS — H16223 Keratoconjunctivitis sicca, not specified as Sjogren's, bilateral: Secondary | ICD-10-CM | POA: Diagnosis not present

## 2020-09-10 DIAGNOSIS — Z23 Encounter for immunization: Secondary | ICD-10-CM | POA: Diagnosis not present

## 2020-10-01 ENCOUNTER — Other Ambulatory Visit: Payer: Self-pay | Admitting: Internal Medicine

## 2020-10-01 NOTE — Telephone Encounter (Signed)
Please refill as per office routine med refill policy (all routine meds refilled for 3 mo or monthly per pt preference up to one year from last visit, then month to month grace period for 3 mo, then further med refills will have to be denied)  

## 2020-10-27 ENCOUNTER — Other Ambulatory Visit: Payer: Self-pay | Admitting: Pulmonary Disease

## 2020-10-27 DIAGNOSIS — J479 Bronchiectasis, uncomplicated: Secondary | ICD-10-CM

## 2020-11-10 ENCOUNTER — Other Ambulatory Visit: Payer: Self-pay

## 2020-11-10 ENCOUNTER — Ambulatory Visit (HOSPITAL_BASED_OUTPATIENT_CLINIC_OR_DEPARTMENT_OTHER)
Admission: RE | Admit: 2020-11-10 | Discharge: 2020-11-10 | Disposition: A | Discharge status: Home / Self Care | Payer: Medicare Other | Source: Ambulatory Visit | Attending: Pulmonary Disease | Admitting: Pulmonary Disease

## 2020-11-10 DIAGNOSIS — I700 Atherosclerosis of aorta: Secondary | ICD-10-CM | POA: Diagnosis not present

## 2020-11-10 DIAGNOSIS — I2510 Atherosclerotic heart disease of native coronary artery without angina pectoris: Secondary | ICD-10-CM | POA: Diagnosis not present

## 2020-11-10 DIAGNOSIS — J984 Other disorders of lung: Secondary | ICD-10-CM | POA: Diagnosis not present

## 2020-11-10 DIAGNOSIS — J479 Bronchiectasis, uncomplicated: Secondary | ICD-10-CM | POA: Diagnosis not present

## 2020-11-12 NOTE — Progress Notes (Signed)
Called and went over CT result per Dr Elsworth Soho with patient. All questions answered and patient expressed full understanding. Confirmed upcoming office visit Tuesday 11/16/2020 @ 11am. Nothing further needed at this time.

## 2020-11-15 ENCOUNTER — Other Ambulatory Visit (HOSPITAL_BASED_OUTPATIENT_CLINIC_OR_DEPARTMENT_OTHER): Payer: Medicare Other

## 2020-11-16 ENCOUNTER — Other Ambulatory Visit: Payer: Self-pay

## 2020-11-16 ENCOUNTER — Ambulatory Visit (INDEPENDENT_AMBULATORY_CARE_PROVIDER_SITE_OTHER): Payer: Medicare Other | Admitting: Pulmonary Disease

## 2020-11-16 ENCOUNTER — Encounter: Payer: Self-pay | Admitting: Internal Medicine

## 2020-11-16 ENCOUNTER — Encounter: Payer: Self-pay | Admitting: Pulmonary Disease

## 2020-11-16 DIAGNOSIS — A310 Pulmonary mycobacterial infection: Secondary | ICD-10-CM | POA: Diagnosis not present

## 2020-11-16 DIAGNOSIS — J479 Bronchiectasis, uncomplicated: Secondary | ICD-10-CM | POA: Diagnosis not present

## 2020-11-16 MED ORDER — ANORO ELLIPTA 62.5-25 MCG/INH IN AEPB: 1.0000 | INHALATION_SPRAY | Freq: Every day | RESPIRATORY_TRACT | 0 refills | Status: DC

## 2020-11-16 MED ORDER — METOPROLOL TARTRATE 25 MG PO TABS: 12.5000 mg | ORAL_TABLET | Freq: Two times a day (BID) | ORAL | 3 refills | Status: DC

## 2020-11-16 MED ORDER — ALBUTEROL SULFATE HFA 108 (90 BASE) MCG/ACT IN AERS: 2.0000 | INHALATION_SPRAY | Freq: Four times a day (QID) | RESPIRATORY_TRACT | 6 refills | Status: DC | PRN

## 2020-11-16 NOTE — Patient Instructions (Signed)
Sample of Anoro -1 puff daily, let us know if this works so we can send prescription. Prescription for albuterol MDI 2 puffs every 6 hours as needed for wheezing/shortness of breath.  Sputum cup -for AFB

## 2020-11-16 NOTE — Assessment & Plan Note (Signed)
Lung disease appears to be radiologically stable but lung function has dropped I am not convinced this is related to MAC since CT only shows minimal if any progression at all  Sample of Anoro -1 puff daily, let us know if this works so we can send prescription. Prescription for albuterol MDI 2 puffs every 6 hours as needed for wheezing/shortness of breath.  Sputum cup -for AFB -cough is mostly nonproductive so Diane Haney has been unable to provide sputum specimen so far

## 2020-11-16 NOTE — Progress Notes (Signed)
   Subjective:    Patient ID: Diane Haney, female    DOB: [birth year], 74 y.o.   MRN: [id number]  HPI  52 yoex-smokerwith bronchiectasis and MAI  She smoked about 20 pack years before she quit in her early 15s  5/2005Bronchjoscopy >>MAI (S- clarithro, eth, RIF 8.0, synergy positive for E/R) and fortuitum (S- cipro/smikacin/tigecycline).She began antibiotics May 2005 and was treated with ETH/Azithro and cipro until March 2007. Was seen by ID Johnnye Sima), for recurrent MAI with previous tx of MAI triple therapy rx.  Finished last course (30mo) in Feb 2016 .  PMH -stage I moderately differentiated invasive ductal carcinoma of the right breasts/p RT.  Idiopathic LLE DVT (Dx at age 74  Chief Complaint  Patient presents with  . Follow-up    3 month follow up. follow up from HRCT last week.     PFTs 05/2020 showed a drop in lung function, FEV1 dropped from 94% to 74%. Hence we proceeded with HRCT results of which were reviewed today. Symptomatically, she reports a mostly dry cough occasionally productive minimum sputum.  She has been unable to provide a sputum specimen for AFB so far. She also reports increasing dyspnea on usual activities and walking short distances.  Denies wheezing  No obvious postnasal drip or GERD symptoms  Significant tests/ events reviewed 11/2020 HRCt >> Patchy bilateral bronchiectasis, volume loss, peribronchovascular nodularity and scattered mucoid impaction, mildly progressive from 11/24/2019  11/24/2019-CT chest without contrast-chronic changes of MAC,  stable mild emphysematous changes, areas of bronchiectasis and chronic atelectasis,larger areas of nodularity in the right lung seen on the prior study have resolved and there are new areas of vague nodularity.  CT chest 9/2020chronic changes,Several new large nodules are identified predominantly within the right lung measuring up to 1.6 cm. Likely post infectious or inflammatory in  etiology  HRCT Chest 08/2018 >>stable compared to 2018  CT 01/2015>Interval slight progression in right apical consolidation with air bronchograms, probably progression of radiation fibrosis. The left apical component is unchanged.  - stable chronic lung disease with bronchiectasis,scarring and peribronchial nodularity    05/2020 PFTs mild restriction with mild diffusion defect/FEV1 1.81 (77%), ratio 73, DLCO 15.53 (74%) >> drop in FEV1 from 2.16-1.81 and drop in FVC from 2.71-2.48 10/2018PFT-stable with FEV1 94% , ratio 76 , FVC 94%  2011 spirometry normal  Review of Systems neg for any significant sore throat, dysphagia, itching, sneezing, nasal congestion or excess/ purulent secretions, fever, chills, sweats, unintended wt loss, pleuritic or exertional cp, hempoptysis, orthopnea pnd or change in chronic leg swelling. Also denies presyncope, palpitations, heartburn, abdominal pain, nausea, vomiting, diarrhea or change in bowel or urinary habits, dysuria,hematuria, rash, arthralgias, visual complaints, headache, numbness weakness or ataxia.     Objective:   Physical Exam  Gen. Pleasant, thin, in no distress ENT - no thrush, no pallor/icterus,no post nasal drip Neck: No JVD, no thyromegaly, no carotid bruits Lungs: no use of accessory muscles, no dullness to percussion, decreased without rales or rhonchi  Cardiovascular: Rhythm regular, heart sounds  normal, no murmurs or gallops, no peripheral edema Musculoskeletal: No deformities, no cyanosis or clubbing        Assessment & Plan:

## 2020-11-16 NOTE — Assessment & Plan Note (Signed)
She is very compliant with airway clearance regimen including vest and hypertonic saline nebs twice daily  Immunizations up-to-date

## 2020-11-18 ENCOUNTER — Ambulatory Visit: Payer: Medicare Other | Admitting: Adult Health

## 2020-12-02 ENCOUNTER — Ambulatory Visit: Payer: Medicare Other | Admitting: Internal Medicine

## 2020-12-15 ENCOUNTER — Encounter: Payer: Self-pay | Admitting: Internal Medicine

## 2020-12-15 ENCOUNTER — Ambulatory Visit (INDEPENDENT_AMBULATORY_CARE_PROVIDER_SITE_OTHER): Payer: Medicare Other | Admitting: Internal Medicine

## 2020-12-15 ENCOUNTER — Other Ambulatory Visit: Payer: Self-pay

## 2020-12-15 VITALS — BP 112/78 | HR 61 | Temp 98.3°F | Ht 65.5 in | Wt 114.4 lb

## 2020-12-15 DIAGNOSIS — I10 Essential (primary) hypertension: Secondary | ICD-10-CM | POA: Diagnosis not present

## 2020-12-15 DIAGNOSIS — E7849 Other hyperlipidemia: Secondary | ICD-10-CM

## 2020-12-15 DIAGNOSIS — E559 Vitamin D deficiency, unspecified: Secondary | ICD-10-CM

## 2020-12-15 DIAGNOSIS — R7302 Impaired glucose tolerance (oral): Secondary | ICD-10-CM | POA: Diagnosis not present

## 2020-12-15 DIAGNOSIS — M797 Fibromyalgia: Secondary | ICD-10-CM | POA: Diagnosis not present

## 2020-12-15 DIAGNOSIS — E538 Deficiency of other specified B group vitamins: Secondary | ICD-10-CM

## 2020-12-15 LAB — CBC WITH DIFFERENTIAL/PLATELET
Basophils Absolute: 0 10*3/uL (ref 0.0–0.1)
Basophils Relative: 0.5 % (ref 0.0–3.0)
Eosinophils Absolute: 0 10*3/uL (ref 0.0–0.7)
Eosinophils Relative: 0.4 % (ref 0.0–5.0)
HCT: 43.8 % (ref 36.0–46.0)
Hemoglobin: 14.7 g/dL (ref 12.0–15.0)
Lymphocytes Relative: 16.2 % (ref 12.0–46.0)
Lymphs Abs: 1.1 10*3/uL (ref 0.7–4.0)
MCHC: 33.5 g/dL (ref 30.0–36.0)
MCV: 96 fl (ref 78.0–100.0)
Monocytes Absolute: 0.8 10*3/uL (ref 0.1–1.0)
Monocytes Relative: 11.3 % (ref 3.0–12.0)
Neutro Abs: 4.9 10*3/uL (ref 1.4–7.7)
Neutrophils Relative %: 71.6 % (ref 43.0–77.0)
Platelets: 214 10*3/uL (ref 150.0–400.0)
RBC: 4.57 Mil/uL (ref 3.87–5.11)
RDW: 14 % (ref 11.5–15.5)
WBC: 6.8 10*3/uL (ref 4.0–10.5)

## 2020-12-15 LAB — BASIC METABOLIC PANEL
BUN: 16 mg/dL (ref 6–23)
CO2: 30 mEq/L (ref 19–32)
Calcium: 10.4 mg/dL (ref 8.4–10.5)
Chloride: 103 mEq/L (ref 96–112)
Creatinine, Ser: 0.81 mg/dL (ref 0.40–1.20)
GFR: 71.11 mL/min (ref >60.00)
Glucose, Bld: 96 mg/dL (ref 70–99)
Potassium: 4.7 mEq/L (ref 3.5–5.1)
Sodium: 140 mEq/L (ref 135–145)

## 2020-12-15 LAB — URINALYSIS, ROUTINE W REFLEX MICROSCOPIC
Bilirubin Urine: NEGATIVE
Ketones, ur: NEGATIVE
Leukocytes,Ua: NEGATIVE
Nitrite: NEGATIVE
Specific Gravity, Urine: 1.015 (ref 1.000–1.030)
Total Protein, Urine: NEGATIVE
Urine Glucose: NEGATIVE
Urobilinogen, UA: 0.2 (ref 0.0–1.0)
WBC, UA: NONE SEEN (ref 0–?)
pH: 6 (ref 5.0–8.0)

## 2020-12-15 LAB — VITAMIN D 25 HYDROXY (VIT D DEFICIENCY, FRACTURES): VITD: 56.72 ng/mL (ref 30.00–100.00)

## 2020-12-15 LAB — HEPATIC FUNCTION PANEL
ALT: 26 U/L (ref 0–35)
AST: 30 U/L (ref 0–37)
Albumin: 4.7 g/dL (ref 3.5–5.2)
Alkaline Phosphatase: 41 U/L (ref 39–117)
Bilirubin, Direct: 0.2 mg/dL (ref 0.0–0.3)
Total Bilirubin: 0.5 mg/dL (ref 0.2–1.2)
Total Protein: 8.1 g/dL (ref 6.0–8.3)

## 2020-12-15 LAB — LIPID PANEL
Cholesterol: 163 mg/dL (ref 0–200)
HDL: 71.6 mg/dL (ref >39.00)
LDL Cholesterol: 78 mg/dL (ref 0–99)
NonHDL: 91.19
Total CHOL/HDL Ratio: 2
Triglycerides: 66 mg/dL (ref 0.0–149.0)
VLDL: 13.2 mg/dL (ref 0.0–40.0)

## 2020-12-15 LAB — TSH: TSH: 5.38 u[IU]/mL — ABNORMAL HIGH (ref 0.35–4.50)

## 2020-12-15 LAB — HEMOGLOBIN A1C: Hgb A1c MFr Bld: 6 % (ref 4.6–6.5)

## 2020-12-15 LAB — VITAMIN B12: Vitamin B-12: 592 pg/mL (ref 211–911)

## 2020-12-15 MED ORDER — AMLODIPINE BESYLATE 5 MG PO TABS: 5.0000 mg | ORAL_TABLET | Freq: Every day | ORAL | 3 refills | Status: DC

## 2020-12-15 MED ORDER — TEMAZEPAM 15 MG PO CAPS: ORAL_CAPSULE | ORAL | 1 refills | Status: DC

## 2020-12-15 MED ORDER — ATORVASTATIN CALCIUM 20 MG PO TABS: 20.0000 mg | ORAL_TABLET | Freq: Every day | ORAL | 3 refills | Status: DC

## 2020-12-15 MED ORDER — ZOSTER VAC RECOMB ADJUVANTED 50 MCG/0.5ML IM SUSR: 0.5000 mL | Freq: Once | INTRAMUSCULAR | 1 refills | Status: AC

## 2020-12-15 MED ORDER — OLMESARTAN MEDOXOMIL-HCTZ 20-12.5 MG PO TABS: 0.5000 | ORAL_TABLET | Freq: Every day | ORAL | 3 refills | Status: DC

## 2020-12-15 MED ORDER — GABAPENTIN 100 MG PO CAPS: 100.0000 mg | ORAL_CAPSULE | Freq: Three times a day (TID) | ORAL | 1 refills | Status: DC

## 2020-12-15 MED ORDER — CARISOPRODOL 350 MG PO TABS: 350.0000 mg | ORAL_TABLET | Freq: Two times a day (BID) | ORAL | 1 refills | Status: DC | PRN

## 2020-12-15 NOTE — Patient Instructions (Signed)
Your prescription for the shingles shot was sent  Please continue all other medications as before, and refills have been done if requested.  Please have the pharmacy call with any other refills you may need.  Please continue your efforts at being more active, low cholesterol diet, and weight control.  You are otherwise up to date with prevention measures today.  Please keep your appointments with your specialists as you may have planned - Dr Vassie Loll  Please go to the LAB at the blood drawing area for the tests to be done  You will be contacted by phone if any changes need to be made immediately.  Otherwise, you will receive a letter about your results with an explanation, but please check with MyChart first.  Please remember to sign up for MyChart if you have not done so, as this will be important to you in the future with finding out test results, communicating by private email, and scheduling acute appointments online when needed.  Please make an Appointment to return for your 1 year visit, or sooner if needed

## 2020-12-15 NOTE — Progress Notes (Signed)
Established Patient Office Visit  Subjective:  Patient ID: Diane Haney, female    DOB: [birth year]  Age: 75 y.o. MRN: [id number]      Chief Complaint: (concise statement describing the symptom, problem, condition, diagnosis, physician recommended return, or other factor as reason for encounter) follow up HTN, HLD and hyperglycemia and FMS       HPI:  Diane Haney is a 75 y.o. female here to f/u; overall doing ok,  Pt denies chest pain, increasing sob or doe, wheezing, orthopnea, PND, increased LE swelling, palpitations, dizziness or syncope.  Pt denies new neurological symptoms such as new headache, or facial or extremity weakness or numbness.  Pt denies polydipsia, polyuria, or symptomatic low sugars. Pt states overall good compliance with meds, mostly trying to follow appropriate diet, with wt overall stable,  but little exercise however.  FMS symptoms much improved recently for some reason as pain very mild with current tx.  No new complaints .        Wt Readings from Last 3 Encounters:  12/15/20 114 lb 6 oz (51.9 kg)  11/16/20 114 lb 4 oz (51.8 kg)  09/03/20 114 lb (51.7 kg)   BP Readings from Last 3 Encounters:  12/15/20 112/78  11/16/20 110/62  09/03/20 120/68        Past Medical History:  Diagnosis Date  . Breast cancer ([place]) 11/12/2012   Dx oct 2013 - [place] - Invasive ductal carcinoma, s/o bilat mastectomy with + margin  - also for XRT soon  . Bronchiectasis   . Chronic fatigue fibromyalgia syndrome 11/12/2012  . COPD (chronic obstructive pulmonary disease) ([place])   . DVT, lower extremity ([facility])    LLE  . Fibromyalgia   . GERD (gastroesophageal reflux disease)   . History of shingles   . Hyperlipidemia   . Hypertension   . Impaired glucose tolerance 11/14/2013  . Migraine   . Osteopenia   . Osteoporosis 10/16/2007   Qualifier: Diagnosis of  By: Jenny Reichmann MD, [place] Oris   . Pulmonary Mycobacterium avium complex (MAC) infection ([place])   . Squamous cell skin  cancer, multiple sites    Past Surgical History:  Procedure Laterality Date  . FOOT SURGERY    . mastectomy bilateral oct 2013    . shoulder impingement    . VIDEO BRONCHOSCOPY N/A 05/07/2014   Procedure: VIDEO BRONCHOSCOPY WITHOUT FLUORO;  Surgeon: Elsie Stain, MD;  Location: [facility];  Service: Cardiopulmonary;  Laterality: N/A;    reports that she quit smoking about 35 years ago. Her smoking use included cigarettes. She has a 25.00 pack-year smoking history. She has never used smokeless tobacco. She reports that she does not drink alcohol and does not use drugs. family history includes Breast cancer in an other family member; Colon polyps in her father; Heart disease in an other family member; Hyperlipidemia in an other family member; Hypertension in an other family member; Lung cancer in her father; Other in an other family member. Allergies  Allergen Reactions  . Losartan     headache  . Adhesive [Tape]     Blisters  . Ciprofloxacin     REACTION: tendinitis  . Clarithromycin     REACTION: severe reflux  . Prednisone     REACTION: irregular heartbeat, not able to sleep   Current Outpatient Medications on File Prior to Visit  Medication Sig Dispense Refill  . albuterol (VENTOLIN HFA) 108 (90 Base) MCG/ACT inhaler Inhale 2 puffs into the  lungs every 6 (six) hours as needed for wheezing or shortness of breath. 8 g 6  . aspirin 81 MG EC tablet Take 81 mg by mouth daily.    . calcium citrate-vitamin D (CITRACAL+D) 315-200 MG-UNIT per tablet Take 2 tablets by mouth daily.     . diclofenac sodium (VOLTAREN) 1 % GEL Apply 2 g topically 4 (four) times daily as needed. 100 g 5  . metoprolol tartrate (LOPRESSOR) 25 MG tablet Take 0.5 tablets (12.5 mg total) by mouth 2 (two) times daily. Pt taking .5 tab 90 tablet 3  . Respiratory Therapy Supplies (FLUTTER) DEVI Use as directed. 1 each 0  . sodium chloride HYPERTONIC 3 % nebulizer solution TAKE 2 ML VIA NEBULIZATION EVERY DAY 240 mL  3  . umeclidinium-vilanterol (ANORO ELLIPTA) 62.5-25 MCG/INH AEPB Inhale 1 puff into the lungs daily. 1 each 0   No current facility-administered medications on file prior to visit.        ROS:  All others reviewed and negative.  Objective        PE:  BP 112/78 (BP Location: Left Arm, Patient Position: Sitting, Cuff Size: Large)   Pulse 61   Temp 98.3 F (36.8 C) (Oral)   Ht 5' 5.5" (1.664 m)   Wt 114 lb 6 oz (51.9 kg)   SpO2 98%   BMI 18.74 kg/m                 Constitutional: Pt appears in NAD               HENT: Head: NCAT.                Right Ear: External ear normal.                 Left Ear: External ear normal.                Eyes: . Pupils are equal, round, and reactive to light. Conjunctivae and EOM are normal               Nose: without d/c or deformity               Neck: Neck supple. Gross normal ROM               Cardiovascular: Normal rate and regular rhythm.                 Pulmonary/Chest: Effort normal and breath sounds without rales or wheezing.                Abd:  Soft, NT, ND, + BS, no organomegaly               Neurological: Pt is alert. At baseline orientation, motor grossly intact               Skin: Skin is warm. No rashes, no other new lesions, LE edema - none               Psychiatric: Pt behavior is normal without agitation   Assessment/Plan:  Diane Haney is a 75 y.o. White or Caucasian [1] female with  has a past medical history of Breast cancer ([place]) (11/12/2012), Bronchiectasis, Chronic fatigue fibromyalgia syndrome (11/12/2012), COPD (chronic obstructive pulmonary disease) ([place]), DVT, lower extremity ([place]), Fibromyalgia, GERD (gastroesophageal reflux disease), History of shingles, Hyperlipidemia, Hypertension, Impaired glucose tolerance (11/14/2013), Migraine, Osteopenia, Osteoporosis (10/16/2007), Pulmonary Mycobacterium avium complex (MAC) infection ([place]), and Squamous cell skin cancer, multiple  sites.   Assessment Plan  See problem oriented  assessment and plan Labs reviewed for each problem: Lab Results  Component Value Date   WBC 6.8 12/15/2020   HGB 14.7 12/15/2020   HCT 43.8 12/15/2020   PLT 214.0 12/15/2020   GLUCOSE 96 12/15/2020   CHOL 163 12/15/2020   TRIG 66.0 12/15/2020   HDL 71.60 12/15/2020   LDLDIRECT 124.4 11/14/2013   LDLCALC 78 12/15/2020   ALT 26 12/15/2020   AST 30 12/15/2020   NA 140 12/15/2020   K 4.7 12/15/2020   CL 103 12/15/2020   CREATININE 0.81 12/15/2020   BUN 16 12/15/2020   CO2 30 12/15/2020   TSH 5.38 (H) 12/15/2020   HGBA1C 6.0 12/15/2020    Micro: none  Cardiac tracings I have personally interpreted today:  none  Pertinent Radiological findings (summarize): none    There are no preventive care reminders to display for this patient.  There are no preventive care reminders to display for this patient.  Lab Results  Component Value Date   TSH 5.38 (H) 12/15/2020   Lab Results  Component Value Date   WBC 6.8 12/15/2020   HGB 14.7 12/15/2020   HCT 43.8 12/15/2020   MCV 96.0 12/15/2020   PLT 214.0 12/15/2020   Lab Results  Component Value Date   NA 140 12/15/2020   K 4.7 12/15/2020   CO2 30 12/15/2020   GLUCOSE 96 12/15/2020   BUN 16 12/15/2020   CREATININE 0.81 12/15/2020   BILITOT 0.5 12/15/2020   ALKPHOS 41 12/15/2020   AST 30 12/15/2020   ALT 26 12/15/2020   PROT 8.1 12/15/2020   ALBUMIN 4.7 12/15/2020   CALCIUM 10.4 12/15/2020   GFR 71.11 12/15/2020   Lab Results  Component Value Date   CHOL 163 12/15/2020   Lab Results  Component Value Date   HDL 71.60 12/15/2020   Lab Results  Component Value Date   LDLCALC 78 12/15/2020   Lab Results  Component Value Date   TRIG 66.0 12/15/2020   Lab Results  Component Value Date   CHOLHDL 2 12/15/2020   Lab Results  Component Value Date   HGBA1C 6.0 12/15/2020      Assessment & Plan:   Problem List Items Addressed This Visit      Medium   Impaired glucose tolerance    Lab Results  Component  Value Date   HGBA1C 6.0 12/15/2020   Stable, pt to continue current medical treatment  - diet       Relevant Orders   Hemoglobin A1c (Completed)   Hyperlipidemia - Primary    Lab Results  Component Value Date   LDLCALC 78 12/15/2020   Stable, pt to continue current statin lipitor 20   Current Outpatient Medications (Cardiovascular):  .  metoprolol tartrate (LOPRESSOR) 25 MG tablet, Take 0.5 tablets (12.5 mg total) by mouth 2 (two) times daily. Pt taking .5 tab .  amLODipine (NORVASC) 5 MG tablet, Take 1 tablet (5 mg total) by mouth daily. Marland Kitchen  atorvastatin (LIPITOR) 20 MG tablet, Take 1 tablet (20 mg total) by mouth daily. Marland Kitchen  olmesartan-hydrochlorothiazide (BENICAR HCT) 20-12.5 MG tablet, Take 0.5 tablets by mouth daily.  Current Outpatient Medications (Respiratory):  .  albuterol (VENTOLIN HFA) 108 (90 Base) MCG/ACT inhaler, Inhale 2 puffs into the lungs every 6 (six) hours as needed for wheezing or shortness of breath. .  sodium chloride HYPERTONIC 3 % nebulizer solution, TAKE 2 ML VIA NEBULIZATION EVERY DAY .  umeclidinium-vilanterol (  ANORO ELLIPTA) 62.5-25 MCG/INH AEPB, Inhale 1 puff into the lungs daily.  Current Outpatient Medications (Analgesics):  .  aspirin 81 MG EC tablet, Take 81 mg by mouth daily.   Current Outpatient Medications (Other):  .  calcium citrate-vitamin D (CITRACAL+D) 315-200 MG-UNIT per tablet, Take 2 tablets by mouth daily.  .  diclofenac sodium (VOLTAREN) 1 % GEL, Apply 2 g topically 4 (four) times daily as needed. Marland Kitchen  Respiratory Therapy Supplies (FLUTTER) DEVI, Use as directed. .  carisoprodol (SOMA) 350 MG tablet, Take 1 tablet (350 mg total) by mouth 2 (two) times daily as needed for muscle spasms. Marland Kitchen  gabapentin (NEURONTIN) 100 MG capsule, Take 1 capsule (100 mg total) by mouth 3 (three) times daily. .  temazepam (RESTORIL) 15 MG capsule, TAKE 1 OR 2 CAPSULES BY MOUTH EVERY NIGHT AT BEDTIME AS NEEDED FOR SLEEP      Relevant Medications    amLODipine (NORVASC) 5 MG tablet   atorvastatin (LIPITOR) 20 MG tablet   olmesartan-hydrochlorothiazide (BENICAR HCT) 20-12.5 MG tablet   Other Relevant Orders   Hepatic function panel (Completed)   Lipid panel (Completed)   CBC with Differential/Platelet (Completed)   TSH (Completed)   Urinalysis, Routine w reflex microscopic (Completed)   Basic metabolic panel (Completed)   Fibromyalgia    Stabe, cont current tx      Relevant Medications   carisoprodol (SOMA) 350 MG tablet   gabapentin (NEURONTIN) 100 MG capsule   Essential hypertension    BP Readings from Last 3 Encounters:  12/15/20 112/78  11/16/20 110/62  09/03/20 120/68   Stable, pt to continue medical treatment norvasc, lopressor, benicar hct       Relevant Medications   amLODipine (NORVASC) 5 MG tablet   atorvastatin (LIPITOR) 20 MG tablet   olmesartan-hydrochlorothiazide (BENICAR HCT) 20-12.5 MG tablet    Other Visit Diagnoses    B12 deficiency       Relevant Orders   Vitamin B12 (Completed)   Vitamin D deficiency       Relevant Orders   VITAMIN D 25 Hydroxy (Vit-D Deficiency, Fractures) (Completed)      Meds ordered this encounter  Medications  . Zoster Vaccine Adjuvanted [facility]) injection    Sig: Inject 0.5 mLs into the muscle once for 1 dose.    Dispense:  0.5 mL    Refill:  1  . amLODipine (NORVASC) 5 MG tablet    Sig: Take 1 tablet (5 mg total) by mouth daily.    Dispense:  90 tablet    Refill:  3  . atorvastatin (LIPITOR) 20 MG tablet    Sig: Take 1 tablet (20 mg total) by mouth daily.    Dispense:  90 tablet    Refill:  3  . temazepam (RESTORIL) 15 MG capsule    Sig: TAKE 1 OR 2 CAPSULES BY MOUTH EVERY NIGHT AT BEDTIME AS NEEDED FOR SLEEP    Dispense:  180 capsule    Refill:  1  . carisoprodol (SOMA) 350 MG tablet    Sig: Take 1 tablet (350 mg total) by mouth 2 (two) times daily as needed for muscle spasms.    Dispense:  180 tablet    Refill:  1  . gabapentin (NEURONTIN) 100 MG  capsule    Sig: Take 1 capsule (100 mg total) by mouth 3 (three) times daily.    Dispense:  270 capsule    Refill:  1  . olmesartan-hydrochlorothiazide (BENICAR HCT) 20-12.5 MG tablet    Sig:  Take 0.5 tablets by mouth daily.    Dispense:  45 tablet    Refill:  3    Follow-up: Return in about 1 year (around 12/15/2021).   Cathlean Cower, MD 12/15/2020 10:40 AM [facility]

## 2020-12-24 ENCOUNTER — Telehealth: Payer: Self-pay

## 2020-12-24 NOTE — Telephone Encounter (Signed)
fibromyalgia

## 2020-12-24 NOTE — Telephone Encounter (Addendum)
Plan does not cover this medication.  Will need prior auth, however, will send to PCP for clarification on diagnosis for requested medication. (Key: [id number])

## 2020-12-24 NOTE — Telephone Encounter (Signed)
Key: [id number]

## 2020-12-27 ENCOUNTER — Encounter: Payer: Self-pay | Admitting: Internal Medicine

## 2020-12-27 NOTE — Assessment & Plan Note (Signed)
Stabe, cont current tx

## 2020-12-27 NOTE — Assessment & Plan Note (Signed)
BP Readings from Last 3 Encounters:  12/15/20 112/78  11/16/20 110/62  09/03/20 120/68   Stable, pt to continue medical treatment norvasc, lopressor, benicar hct

## 2020-12-27 NOTE — Assessment & Plan Note (Signed)
Lab Results  Component Value Date   LDLCALC 78 12/15/2020   Stable, pt to continue current statin lipitor 20   Current Outpatient Medications (Cardiovascular):  .  metoprolol tartrate (LOPRESSOR) 25 MG tablet, Take 0.5 tablets (12.5 mg total) by mouth 2 (two) times daily. Pt taking .5 tab .  amLODipine (NORVASC) 5 MG tablet, Take 1 tablet (5 mg total) by mouth daily. Marland Kitchen  atorvastatin (LIPITOR) 20 MG tablet, Take 1 tablet (20 mg total) by mouth daily. Marland Kitchen  olmesartan-hydrochlorothiazide (BENICAR HCT) 20-12.5 MG tablet, Take 0.5 tablets by mouth daily.  Current Outpatient Medications (Respiratory):  .  albuterol (VENTOLIN HFA) 108 (90 Base) MCG/ACT inhaler, Inhale 2 puffs into the lungs every 6 (six) hours as needed for wheezing or shortness of breath. .  sodium chloride HYPERTONIC 3 % nebulizer solution, TAKE 2 ML VIA NEBULIZATION EVERY DAY .  umeclidinium-vilanterol (ANORO ELLIPTA) 62.5-25 MCG/INH AEPB, Inhale 1 puff into the lungs daily.  Current Outpatient Medications (Analgesics):  .  aspirin 81 MG EC tablet, Take 81 mg by mouth daily.   Current Outpatient Medications (Other):  .  calcium citrate-vitamin D (CITRACAL+D) 315-200 MG-UNIT per tablet, Take 2 tablets by mouth daily.  .  diclofenac sodium (VOLTAREN) 1 % GEL, Apply 2 g topically 4 (four) times daily as needed. Marland Kitchen  Respiratory Therapy Supplies (FLUTTER) DEVI, Use as directed. .  carisoprodol (SOMA) 350 MG tablet, Take 1 tablet (350 mg total) by mouth 2 (two) times daily as needed for muscle spasms. Marland Kitchen  gabapentin (NEURONTIN) 100 MG capsule, Take 1 capsule (100 mg total) by mouth 3 (three) times daily. .  temazepam (RESTORIL) 15 MG capsule, TAKE 1 OR 2 CAPSULES BY MOUTH EVERY NIGHT AT BEDTIME AS NEEDED FOR SLEEP

## 2020-12-29 ENCOUNTER — Encounter: Payer: Self-pay | Admitting: Internal Medicine

## 2020-12-30 ENCOUNTER — Telehealth: Payer: Self-pay

## 2020-12-30 DIAGNOSIS — M810 Age-related osteoporosis without current pathological fracture: Secondary | ICD-10-CM

## 2020-12-30 NOTE — Telephone Encounter (Signed)
PA was cancelled due to being submitted to secondary insurance.  Pt pharmacy called to obtain BIN #.

## 2020-12-30 NOTE — Telephone Encounter (Signed)
dxa ordered  Staff to assist with pt making appt for aug 2022 please

## 2021-01-03 NOTE — Telephone Encounter (Signed)
Appointment has been made

## 2021-01-06 DIAGNOSIS — L570 Actinic keratosis: Secondary | ICD-10-CM | POA: Diagnosis not present

## 2021-01-06 DIAGNOSIS — L578 Other skin changes due to chronic exposure to nonionizing radiation: Secondary | ICD-10-CM | POA: Diagnosis not present

## 2021-01-06 DIAGNOSIS — Z85828 Personal history of other malignant neoplasm of skin: Secondary | ICD-10-CM | POA: Diagnosis not present

## 2021-01-06 DIAGNOSIS — I788 Other diseases of capillaries: Secondary | ICD-10-CM | POA: Diagnosis not present

## 2021-01-06 NOTE — Telephone Encounter (Signed)
LVM requesting a call back for the pt's BIN#, PCN, & RxGroup so that PA can be started.

## 2021-01-06 NOTE — Telephone Encounter (Signed)
[place]: [place] [id number] PCN [id number]  RxGroup [id number]

## 2021-01-06 NOTE — Telephone Encounter (Signed)
New KeyJodi Haney #: [id number] PCN [id number] Rx Group: [id number]

## 2021-01-06 NOTE — Telephone Encounter (Signed)
LVM requesting patient's BIN#, PCN, & RxGroup to start PA.

## 2021-01-06 NOTE — Telephone Encounter (Signed)
Carisoprodol 350mg  is approved for non-formulary exception through 12/10/21 under patient's Medicare Part D benefit. Pt notified.

## 2021-01-13 NOTE — Telephone Encounter (Signed)
Temazepam 15mg  capsule, use as directed (2 capsules per day), is approved through 12/10/2021 under your Medicare Part D benefit. Reviewed by: [email or web address], RPh

## 2021-02-01 DIAGNOSIS — I471 Supraventricular tachycardia: Secondary | ICD-10-CM | POA: Diagnosis not present

## 2021-02-01 DIAGNOSIS — I10 Essential (primary) hypertension: Secondary | ICD-10-CM | POA: Diagnosis not present

## 2021-02-01 DIAGNOSIS — E7800 Pure hypercholesterolemia, unspecified: Secondary | ICD-10-CM | POA: Diagnosis not present

## 2021-02-01 DIAGNOSIS — R0600 Dyspnea, unspecified: Secondary | ICD-10-CM | POA: Diagnosis not present

## 2021-02-01 DIAGNOSIS — I2510 Atherosclerotic heart disease of native coronary artery without angina pectoris: Secondary | ICD-10-CM | POA: Diagnosis not present

## 2021-02-02 DIAGNOSIS — I517 Cardiomegaly: Secondary | ICD-10-CM | POA: Diagnosis not present

## 2021-02-11 ENCOUNTER — Telehealth: Payer: Self-pay | Admitting: Pulmonary Disease

## 2021-02-11 MED ORDER — AZITHROMYCIN 250 MG PO TABS: ORAL_TABLET | ORAL | 0 refills | Status: DC

## 2021-02-11 NOTE — Telephone Encounter (Signed)
Agree with Covid testing. Rx for Z-Pak Please offer her office visit next week if Covid negative

## 2021-02-11 NOTE — Telephone Encounter (Signed)
Patient is returning phone call. Patient phone number is [phone].

## 2021-02-11 NOTE — Telephone Encounter (Signed)
Dr. Elsworth Soho, The patient took a home covid test and it was negative.  Just wanted to make you aware.  A sick message was sent to you earlier.  Thank you.

## 2021-02-11 NOTE — Telephone Encounter (Signed)
ATC LVMTCB  

## 2021-02-11 NOTE — Telephone Encounter (Signed)
Spoke with pt who states she has had fever/chills/ cough x 1 day. Pt states that she has not taken a Covid test but does have access to a in home test that she will take and notify us of results. Pt says she has been using hypertonic nebulizer but it has not provided any relief. Pt denies using rescue inhaler r/t making her heart race. Pt was advised if her symptoms get worse to seek care at [facility] or ED. Dr. Elsworth Soho please advise.

## 2021-02-11 NOTE — Telephone Encounter (Signed)
Called and spoke with patient. She stated that her at home test came back negative. She has been scheduled for an OV on 02/18/21 at 11am with RA. She verbalized understanding of zpak instructions. This has been sent in.   Nothing further needed at time of call.

## 2021-02-14 ENCOUNTER — Other Ambulatory Visit: Payer: Medicare Other

## 2021-02-14 DIAGNOSIS — A310 Pulmonary mycobacterial infection: Secondary | ICD-10-CM

## 2021-02-15 ENCOUNTER — Emergency Department (HOSPITAL_BASED_OUTPATIENT_CLINIC_OR_DEPARTMENT_OTHER): Payer: Medicare Other

## 2021-02-15 ENCOUNTER — Emergency Department (HOSPITAL_BASED_OUTPATIENT_CLINIC_OR_DEPARTMENT_OTHER)
Admission: EM | Admit: 2021-02-15 | Discharge: 2021-02-15 | Disposition: A | Discharge status: Home / Self Care | Payer: Medicare Other | Attending: Emergency Medicine | Admitting: Emergency Medicine

## 2021-02-15 ENCOUNTER — Other Ambulatory Visit: Payer: Self-pay

## 2021-02-15 ENCOUNTER — Encounter (HOSPITAL_BASED_OUTPATIENT_CLINIC_OR_DEPARTMENT_OTHER): Payer: Self-pay

## 2021-02-15 DIAGNOSIS — R059 Cough, unspecified: Secondary | ICD-10-CM | POA: Diagnosis not present

## 2021-02-15 DIAGNOSIS — Z20822 Contact with and (suspected) exposure to covid-19: Secondary | ICD-10-CM | POA: Diagnosis not present

## 2021-02-15 DIAGNOSIS — J449 Chronic obstructive pulmonary disease, unspecified: Secondary | ICD-10-CM | POA: Insufficient documentation

## 2021-02-15 DIAGNOSIS — Z79899 Other long term (current) drug therapy: Secondary | ICD-10-CM | POA: Insufficient documentation

## 2021-02-15 DIAGNOSIS — I10 Essential (primary) hypertension: Secondary | ICD-10-CM | POA: Insufficient documentation

## 2021-02-15 DIAGNOSIS — Z87891 Personal history of nicotine dependence: Secondary | ICD-10-CM | POA: Diagnosis not present

## 2021-02-15 DIAGNOSIS — Z853 Personal history of malignant neoplasm of breast: Secondary | ICD-10-CM | POA: Diagnosis not present

## 2021-02-15 DIAGNOSIS — Z7982 Long term (current) use of aspirin: Secondary | ICD-10-CM | POA: Diagnosis not present

## 2021-02-15 DIAGNOSIS — J180 Bronchopneumonia, unspecified organism: Secondary | ICD-10-CM | POA: Insufficient documentation

## 2021-02-15 DIAGNOSIS — R0602 Shortness of breath: Secondary | ICD-10-CM | POA: Insufficient documentation

## 2021-02-15 DIAGNOSIS — R509 Fever, unspecified: Secondary | ICD-10-CM | POA: Diagnosis not present

## 2021-02-15 LAB — CBC WITH DIFFERENTIAL/PLATELET
Abs Immature Granulocytes: 0.02 10*3/uL (ref 0.00–0.07)
Basophils Absolute: 0 10*3/uL (ref 0.0–0.1)
Basophils Relative: 1 %
Eosinophils Absolute: 0.2 10*3/uL (ref 0.0–0.5)
Eosinophils Relative: 3 %
HCT: 43.5 % (ref 36.0–46.0)
Hemoglobin: 14.4 g/dL (ref 12.0–15.0)
Immature Granulocytes: 0 %
Lymphocytes Relative: 13 %
Lymphs Abs: 0.7 10*3/uL (ref 0.7–4.0)
MCH: 32.1 pg (ref 26.0–34.0)
MCHC: 33.1 g/dL (ref 30.0–36.0)
MCV: 96.9 fL (ref 80.0–100.0)
Monocytes Absolute: 1 10*3/uL (ref 0.1–1.0)
Monocytes Relative: 17 %
Neutro Abs: 3.9 10*3/uL (ref 1.7–7.7)
Neutrophils Relative %: 66 %
Platelets: 214 10*3/uL (ref 150–400)
RBC: 4.49 MIL/uL (ref 3.87–5.11)
RDW: 13.6 % (ref 11.5–15.5)
WBC: 5.8 10*3/uL (ref 4.0–10.5)
nRBC: 0 % (ref 0.0–0.2)

## 2021-02-15 LAB — URINALYSIS, ROUTINE W REFLEX MICROSCOPIC
Bilirubin Urine: NEGATIVE
Glucose, UA: NEGATIVE mg/dL
Ketones, ur: NEGATIVE mg/dL
Leukocytes,Ua: NEGATIVE
Nitrite: NEGATIVE
Protein, ur: NEGATIVE mg/dL
Specific Gravity, Urine: 1.005 (ref 1.005–1.030)
pH: 7 (ref 5.0–8.0)

## 2021-02-15 LAB — COMPREHENSIVE METABOLIC PANEL
ALT: 21 U/L (ref 0–44)
AST: 29 U/L (ref 15–41)
Albumin: 4.3 g/dL (ref 3.5–5.0)
Alkaline Phosphatase: 52 U/L (ref 38–126)
Anion gap: 7 (ref 5–15)
BUN: 14 mg/dL (ref 8–23)
CO2: 32 mmol/L (ref 22–32)
Calcium: 10 mg/dL (ref 8.9–10.3)
Chloride: 101 mmol/L (ref 98–111)
Creatinine, Ser: 0.85 mg/dL (ref 0.44–1.00)
GFR, Estimated: >60 mL/min (ref >60)
Glucose, Bld: 105 mg/dL — ABNORMAL HIGH (ref 70–99)
Potassium: 4.2 mmol/L (ref 3.5–5.1)
Sodium: 140 mmol/L (ref 135–145)
Total Bilirubin: 0.4 mg/dL (ref 0.3–1.2)
Total Protein: 7.6 g/dL (ref 6.5–8.1)

## 2021-02-15 LAB — TROPONIN I (HIGH SENSITIVITY)
Troponin I (High Sensitivity): 3 ng/L (ref <18)
Troponin I (High Sensitivity): 3 ng/L (ref <18)

## 2021-02-15 LAB — LACTIC ACID, PLASMA
Lactic Acid, Venous: 1.5 mmol/L (ref 0.5–1.9)
Lactic Acid, Venous: 1.7 mmol/L (ref 0.5–1.9)

## 2021-02-15 LAB — URINALYSIS, MICROSCOPIC (REFLEX)

## 2021-02-15 LAB — APTT: aPTT: 30 seconds (ref 24–36)

## 2021-02-15 LAB — SARS CORONAVIRUS 2 BY RT PCR (HOSPITAL ORDER, PERFORMED IN ~~LOC~~ HOSPITAL LAB): SARS Coronavirus 2: NEGATIVE

## 2021-02-15 LAB — PROTIME-INR
INR: 0.9 (ref 0.8–1.2)
Prothrombin Time: 12.2 seconds (ref 11.4–15.2)

## 2021-02-15 MED ORDER — IOHEXOL 350 MG/ML SOLN
100.0000 mL | Freq: Once | INTRAVENOUS | Status: AC | PRN
  Administered 2021-02-15: 100 mL via INTRAVENOUS

## 2021-02-15 MED ORDER — AMOXICILLIN-POT CLAVULANATE 875-125 MG PO TABS: 1.0000 | ORAL_TABLET | Freq: Two times a day (BID) | ORAL | 0 refills | Status: DC

## 2021-02-15 MED ORDER — PROMETHAZINE-DM 6.25-15 MG/5ML PO SYRP: 5.0000 mL | ORAL_SOLUTION | Freq: Four times a day (QID) | ORAL | 0 refills | Status: DC | PRN

## 2021-02-15 MED ORDER — AMOXICILLIN-POT CLAVULANATE 875-125 MG PO TABS
1.0000 | ORAL_TABLET | Freq: Once | ORAL | Status: AC
  Administered 2021-02-15: 1 via ORAL
  Filled 2021-02-15: qty 1

## 2021-02-15 NOTE — Discharge Instructions (Signed)
Please take antibiotics twice daily as prescribed.  You may use cough medication every 6 hours as needed for cough, this can cause drowsiness, do not take before driving.  Please follow-up with your pulmonologist on Friday as planned.  If you develop worsening shortness of breath, persistent high fevers, chest pain or any other new or worsening symptoms please return to the hospital for reevaluation.

## 2021-02-15 NOTE — ED Provider Notes (Signed)
[facility] Provider Note   CSN: [id number] Arrival date & time: 02/15/21  7412     History Chief Complaint  Patient presents with  . Fever  . Cough    Diane Haney is a 75 y.o. female.  ROCHEL PRIVETT is a 75 y.o. female with a history of COPD, MAC, bronchiectasis, hypertension, hyperlipidemia, GERD, fibromyalgia, migraine, DVT, breast cancer, who presents to the emergency department for evaluation of fever, chills, cough and shortness of breath.  She reports symptoms started about a week ago.  She has been having fevers up to 100.4.  She reports frequent productive cough.  Shortness of breath with activity and some at rest, no home oxygen use.  She denies chest pain just reports soreness from persistent coughing.  She denies nausea or vomiting, no diarrhea and denies any abdominal pain.  Reports feeling very fatigued with generalized body aches.  She took a home Covid test on 3/4, the day after symptoms began.  She is vaccinated for COVID and denies any known sick contacts.  On 3/4 she called her pulmonologist, Dr. Elsworth Soho, and based on symptoms he called her in a Z-Pak.  She reports that she finished this today but has not felt any improvement in her symptoms and she and her husband became concerned.  No other aggravating or alleviating factors.        Past Medical History:  Diagnosis Date  . Breast cancer ([place]) 11/12/2012   Dx oct 2013 - [place] - Invasive ductal carcinoma, s/o bilat mastectomy with + margin  - also for XRT soon  . Bronchiectasis   . Chronic fatigue fibromyalgia syndrome 11/12/2012  . COPD (chronic obstructive pulmonary disease) ([place])   . DVT, lower extremity ([facility])    LLE  . Fibromyalgia   . GERD (gastroesophageal reflux disease)   . History of shingles   . Hyperlipidemia   . Hypertension   . Impaired glucose tolerance 11/14/2013  . Migraine   . Osteopenia   . Osteoporosis 10/16/2007   Qualifier: Diagnosis of   By: Jenny Reichmann MD, Hunt Oris   . Pulmonary Mycobacterium avium complex (MAC) infection ([place])   . Squamous cell skin cancer, multiple sites     Patient Active Problem List   Diagnosis Date Noted  . Pulmonary nodules 09/11/2019  . Cold intolerance 11/20/2018  . Coronary artery calcification seen on CT scan 12/07/2017  . Back pain 12/06/2016  . 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 Insomnia 01/28/2015  . Abnormal finding on mammography 10/07/2014  . Ankle arthropathy 10/07/2014  . Breast CA ([place]) 10/07/2014  . Chronic cough 10/07/2014  . Disseminated mycobacterial infection 10/07/2014  . Abdominal tenderness, epigastric 10/07/2014  . Acquired hallux rigidus 10/07/2014  . Headache, migraine 10/07/2014  . Interdigital neuralgia 10/07/2014  . Panaritium of toe 10/07/2014  . Raynaud's syndrome 10/07/2014  . Plantar fasciitis 10/07/2014  . Overriding toes 10/07/2014  . Impaired glucose tolerance 11/14/2013  . Breast cancer ([place]) 11/12/2012  . Chronic fatigue fibromyalgia syndrome 11/12/2012  . Peripheral edema 02/11/2012  . Preventative health care 11/04/2011  . BRONCHIECTASIS 01/19/2011  . Microscopic hematuria 10/31/2010  . INSOMNIA-SLEEP DISORDER-UNSPEC 06/02/2009  . MENOPAUSAL DISORDER 09/24/2008  . Mycobacterium avium-intracellulare infection ([place]) 10/16/2007  . Hyperlipidemia 10/16/2007  . COMMON MIGRAINE 10/16/2007  . Essential hypertension 10/16/2007  . GERD 10/16/2007  . Fibromyalgia 10/16/2007  . Osteoporosis 10/16/2007  . SHINGLES, HX OF 10/16/2007    Past Surgical History:  Procedure Laterality Date  .  FOOT SURGERY    . mastectomy bilateral oct 2013    . shoulder impingement    . VIDEO BRONCHOSCOPY N/A 05/07/2014   Procedure: VIDEO BRONCHOSCOPY WITHOUT FLUORO;  Surgeon: Elsie Stain, MD;  Location: [facility];  Service: Cardiopulmonary;  Laterality: N/A;     OB History   No obstetric history on file.     Family History  Problem Relation Age of Onset  . Lung  cancer Father   . Colon polyps Father   . Heart disease Other        grandparents  . Breast cancer Other   . Hyperlipidemia Other   . Hypertension Other   . Other Other        alcholism / addiction    Social History   Tobacco Use  . Smoking status: Former Smoker    Packs/day: 1.00    Years: 25.00    Pack years: 25.00    Types: Cigarettes    Quit date: 12/11/1985    Years since quitting: 35.2  . Smokeless tobacco: Never Used  . Tobacco comment: pt does not smoke  Vaping Use  . Vaping Use: Never used  Substance Use Topics  . Alcohol use: No    Comment: occasional  . Drug use: No    Home Medications Prior to Admission medications   Medication Sig Start Date End Date Taking? Authorizing Provider  amoxicillin-clavulanate (AUGMENTIN) 875-125 MG tablet Take 1 tablet by mouth 2 (two) times daily. 02/15/21  Yes Jacqlyn Larsen, PA-C  promethazine-dextromethorphan (PROMETHAZINE-DM) 6.25-15 MG/5ML syrup Take 5 mLs by mouth 4 (four) times daily as needed for cough. 02/15/21  Yes Jacqlyn Larsen, PA-C  albuterol (VENTOLIN HFA) 108 (90 Base) MCG/ACT inhaler Inhale 2 puffs into the lungs every 6 (six) hours as needed for wheezing or shortness of breath. 11/16/20   Rigoberto Noel, MD  amLODipine (NORVASC) 5 MG tablet Take 1 tablet (5 mg total) by mouth daily. 12/15/20 12/15/21  Biagio Borg, MD  aspirin 81 MG EC tablet Take 81 mg by mouth daily.    [provider]  atorvastatin (LIPITOR) 20 MG tablet Take 1 tablet (20 mg total) by mouth daily. 12/15/20   Biagio Borg, MD  azithromycin (ZITHROMAX) 250 MG tablet Take 2 tablets on the first day, then 1 tablet daily until finished 02/11/21   Rigoberto Noel, MD  calcium citrate-vitamin D (CITRACAL+D) 315-200 MG-UNIT per tablet Take 2 tablets by mouth daily.     [provider]  carisoprodol (SOMA) 350 MG tablet Take 1 tablet (350 mg total) by mouth 2 (two) times daily as needed for muscle spasms. 12/15/20   Biagio Borg, MD  diclofenac sodium  (VOLTAREN) 1 % GEL Apply 2 g topically 4 (four) times daily as needed. 11/20/18   Biagio Borg, MD  gabapentin (NEURONTIN) 100 MG capsule Take 1 capsule (100 mg total) by mouth 3 (three) times daily. 12/15/20   Biagio Borg, MD  metoprolol tartrate (LOPRESSOR) 25 MG tablet Take 0.5 tablets (12.5 mg total) by mouth 2 (two) times daily. Pt taking .5 tab 11/16/20 02/14/21  Biagio Borg, MD  olmesartan-hydrochlorothiazide (BENICAR HCT) 20-12.5 MG tablet Take 0.5 tablets by mouth daily. 12/15/20   Biagio Borg, MD  Respiratory Therapy Supplies (FLUTTER) DEVI Use as directed. 07/29/15   Parrett, Fonnie Mu, NP  sodium chloride HYPERTONIC 3 % nebulizer solution TAKE 2 ML VIA NEBULIZATION EVERY DAY 10/27/20   Rigoberto Noel, MD  temazepam (RESTORIL) 15 MG capsule TAKE 1 OR 2 CAPSULES BY MOUTH EVERY NIGHT AT BEDTIME AS NEEDED FOR SLEEP 12/15/20   Biagio Borg, MD  umeclidinium-vilanterol [facility] ELLIPTA) 62.5-25 MCG/INH AEPB Inhale 1 puff into the lungs daily. 11/16/20   Rigoberto Noel, MD    Allergies    Losartan, Adhesive [tape], Ciprofloxacin, Clarithromycin, and Prednisone  Review of Systems   Review of Systems  Constitutional: Positive for chills, fatigue and fever.  HENT: Negative for congestion, rhinorrhea and sore throat.   Respiratory: Positive for cough and shortness of breath.   Cardiovascular: Negative for chest pain.  Gastrointestinal: Negative for abdominal pain, diarrhea, nausea and vomiting.  Genitourinary: Negative for dysuria.  Musculoskeletal: Positive for myalgias.  Neurological: Negative for dizziness, syncope and light-headedness.  All other systems reviewed and are negative.   Physical Exam Updated Vital Signs BP 136/72 (BP Location: Left Arm)   Pulse 93   Temp 98.6 F (37 C) (Oral)   Resp 20   Ht 5' 5.5" (1.664 m)   Wt 50.3 kg   SpO2 96%   BMI 18.19 kg/m   Physical Exam Vitals and nursing note reviewed.  Constitutional:      General: She is not in acute distress.     Appearance: Normal appearance. She is well-developed and well-nourished. She is not ill-appearing or diaphoretic.  HENT:     Head: Normocephalic and atraumatic.     Mouth/Throat:     Mouth: Oropharynx is clear and moist. Mucous membranes are moist.     Pharynx: Oropharynx is clear.  Eyes:     General:        Right eye: No discharge.        Left eye: No discharge.     Extraocular Movements: EOM normal.  Cardiovascular:     Rate and Rhythm: Normal rate and regular rhythm.     Pulses: Intact distal pulses.     Heart sounds: Normal heart sounds. No murmur heard. No friction rub. No gallop.   Pulmonary:     Effort: Pulmonary effort is normal. No respiratory distress.     Breath sounds: Normal breath sounds. No wheezing or rales.     Comments: Patient is mildly tachypneic, but respirations are unlabored, satting well on room air, able to speak in full sentences, on auscultation lungs are clear without wheezes, rales or rhonchi, patient does have frequent cough during exam. Abdominal:     General: Bowel sounds are normal. There is no distension.     Palpations: Abdomen is soft. There is no mass.     Tenderness: There is no abdominal tenderness. There is no guarding.     Comments: Abdomen soft, nondistended, nontender to palpation in all quadrants without guarding or peritoneal signs   Musculoskeletal:        General: No deformity or edema.     Cervical back: Neck supple.  Skin:    General: Skin is warm and dry.     Capillary Refill: Capillary refill takes less than 2 seconds.  Neurological:     Mental Status: She is alert.     Coordination: Coordination normal.     Comments: Speech is clear, able to follow commands Moves extremities without ataxia, coordination intact  Psychiatric:        Mood and Affect: Mood normal.        Behavior: Behavior normal.     ED Results / Procedures / Treatments   Labs (all labs ordered are listed, but only  abnormal results are displayed) Labs  Reviewed  COMPREHENSIVE METABOLIC PANEL - Abnormal; Notable for the following components:      Result Value   Glucose, Bld 105 (*)    All other components within normal limits  URINALYSIS, ROUTINE W REFLEX MICROSCOPIC - Abnormal; Notable for the following components:   Hgb urine dipstick SMALL (*)    All other components within normal limits  URINALYSIS, MICROSCOPIC (REFLEX) - Abnormal; Notable for the following components:   Bacteria, UA RARE (*)    All other components within normal limits  SARS CORONAVIRUS 2 (HOSPITAL ORDER, PERFORMED IN [facility])  CULTURE, BLOOD (SINGLE)  URINE CULTURE  RESP PANEL BY RT-PCR (FLU A&B, COVID) ARPGX2  LACTIC ACID, PLASMA  LACTIC ACID, PLASMA  CBC WITH DIFFERENTIAL/PLATELET  PROTIME-INR  APTT  TROPONIN I (HIGH SENSITIVITY)  TROPONIN I (HIGH SENSITIVITY)    EKG EKG Interpretation  Date/Time:  Tuesday February 15 2021 09:35:13 EST Ventricular Rate:  84 PR Interval:  156 QRS Duration: 74 QT Interval:  372 QTC Calculation: 439 R Axis:   72 Text Interpretation: Sinus rhythm with frequent Premature ventricular complexes Right atrial enlargement Borderline ECG No old tracing to compare Confirmed by Dorie Rank [phone]) on 02/15/2021 9:47:22 AM   Radiology CT Angio Chest PE W and/or Wo Contrast  Result Date: 02/15/2021 CLINICAL DATA:  Fever, chills and cough over the last week. EXAM: CT ANGIOGRAPHY CHEST WITH CONTRAST TECHNIQUE: Multidetector CT imaging of the chest was performed using the standard protocol during bolus administration of intravenous contrast. Multiplanar CT image reconstructions and MIPs were obtained to evaluate the vascular anatomy. CONTRAST:  137m OMNIPAQUE IOHEXOL 350 MG/ML SOLN COMPARISON:  Chest radiography same day.  Chest CT 11/10/2020. FINDINGS: Cardiovascular: Heart size is normal. No pericardial fluid. Some coronary artery calcification is present. Aortic atherosclerotic calcification is present. No aneurysm or  dissection. Pulmonary arterial opacification is good. There are no pulmonary emboli. Mediastinum/Nodes: No mediastinal or hilar mass or lymphadenopathy. Lungs/Pleura: Chronic volume loss in both upper lobes. Chronic scarring and bronchiectasis in the right middle lobe and lingula. Newly seen acute patchy infiltrates in both lungs, more prominent in the right upper lung than the left. Findings consistent with bronchopneumonia. This could be bacterial or viral. No lobar consolidation or acute collapse. No pleural effusion. Upper Abdomen: Negative Musculoskeletal: Negative Review of the MIP images confirms the above findings. IMPRESSION: 1. No pulmonary emboli. 2. Newly seen acute patchy infiltrates in both lungs, more prominent in the right upper lung than the left, consistent with bronchopneumonia. This could be bacterial or viral. No lobar consolidation or acute collapse. 3. Chronic volume loss in both upper lobes. Chronic scarring and bronchiectasis in the right middle lobe and lingula. 4. Coronary artery calcification. Aortic Atherosclerosis (ICD10-I70.0). Electronically Signed   By: MNelson ChimesM.D.   On: 02/15/2021 13:40   DG Chest Port 1 View  Result Date: 02/15/2021 CLINICAL DATA:  Cough with fever and chills EXAM: PORTABLE CHEST 1 VIEW COMPARISON:  Chest radiograph Apr 12, 2020; chest CT November 10, 2020 FINDINGS: No edema or airspace opacity. There is persistent apical pleural thickening bilaterally, more notable on the right than on the left. Micro nodular opacities in the mid and lower lung regions appear stable. Heart size and pulmonary vascularity normal. No adenopathy. There is aortic atherosclerosis. There are surgical clips in the right axillary region. IMPRESSION: No edema or airspace opacity. Apical pleural thickening bilaterally, more severe on the right than on the left, stable.  Micro-nodular opacities also remain stable. Stable cardiac silhouette. Aortic Atherosclerosis (ICD10-I70.0). Comment:  Findings indicative of chronic mycobacterium-avium-intracellulare infection much better delineated on prior CT compared to radiographic examination. Electronically Signed   By: Lowella Grip III M.D.   On: 02/15/2021 11:14    Procedures Procedures   Medications Ordered in ED Medications  iohexol (OMNIPAQUE) 350 MG/ML injection 100 mL (100 mLs Intravenous Contrast Given 02/15/21 1310)  amoxicillin-clavulanate (AUGMENTIN) 875-125 MG per tablet 1 tablet (1 tablet Oral Given 02/15/21 1541)    ED Course  I have reviewed the triage vital signs and the nursing notes.  Pertinent labs & imaging results that were available during my care of the patient were reviewed by me and considered in my medical decision making (see chart for details).    MDM Rules/Calculators/A&P                         75 year old female with history of bronchiectasis and MAC, presents to the ED for a week of fevers, cough and shortness of breath, was prescribed a Z-Pak by her pulmonologist, and despite completing this today she reports no improvement in her symptoms.  Had a negative home Covid test when symptoms began, is vaccinated and denies any known sick contacts.  Febrile up to 100.4 last night.  Will get labs for potential evolving sepsis but given the patient is currently afebrile with normal vitals will hold off on initiating any antibiotics or fluids.  Will evaluate with labs including single blood culture, lactic acid, basic labs, chest x-ray and urinalysis.  Given some reported chest soreness will also check troponin.  I have independently ordered, reviewed and interpreted all labs and imaging: CBC: No leukocytosis, normal hemoglobin CMP: Glucose of 105, no other electrolyte derangements, normal renal and liver function Lactic acid: WNL Coags: WNL Covid: Negative Troponin: Negative x2 UA: No signs of infection  Chest x-ray: No edema or airspace opacity, apical pleural thickening bilaterally, more severe on  right than left, appears stable when compared to previous  Lab work and chest x-ray thus far been reassuring, given persistent symptoms we will check CTA to better characterize patient's chronic lung disease and look for any changes and rule out PE.  CTA with no evidence of PE, there are some new acute patchy infiltrates in both lungs, more on it in the right upper lung than left consistent with bronchopneumonia, this could be bacterial or viral, no lobar consolidation or acute collapse.  CT with signs of bronchopneumonia despite recent antibiotics P, given persistent fevers and temp of 100.4 last night feel patient would benefit from additional treatment.  Her vitals here have been reassuring, she is not requiring any oxygen and lab work overall looks good.  Patient does not want to be admitted to the hospital and I feel this is reasonable.  I attempted to reach her pulmonologist, Dr. Elsworth Soho to discuss antibiotic therapy but he was not in the office today.  Given she was just treated with azithromycin which would cover best for atypicals, will treat with course of Augmentin, she has follow-up appointment with her pulmonologist on Friday.  Provided strict return precautions.  Patient expresses understanding and agreement with them.  Discharged home in good condition.  Final Clinical Impression(s) / ED Diagnoses Final diagnoses:  Bronchopneumonia    Rx / DC Orders ED Discharge Orders         Ordered    promethazine-dextromethorphan (PROMETHAZINE-DM) 6.25-15 MG/5ML syrup  4 times daily  PRN        02/15/21 1537    amoxicillin-clavulanate (AUGMENTIN) 875-125 MG tablet  2 times daily        02/15/21 1537           Jacqlyn Larsen, [place] 02/17/21 0050    Dorie Rank, MD 02/18/21 0700

## 2021-02-15 NOTE — ED Triage Notes (Signed)
Pt arrives with reports of fever, chills, cough X1 week states that her PCP placed her on a Z pack, with today being the last day and she has not gotten any better. Pt did home Covid teste Friday which was negative.

## 2021-02-16 LAB — URINE CULTURE: Culture: NO GROWTH

## 2021-02-18 ENCOUNTER — Other Ambulatory Visit: Payer: Self-pay

## 2021-02-18 ENCOUNTER — Ambulatory Visit (INDEPENDENT_AMBULATORY_CARE_PROVIDER_SITE_OTHER): Payer: Medicare Other | Admitting: Pulmonary Disease

## 2021-02-18 ENCOUNTER — Encounter: Payer: Self-pay | Admitting: Pulmonary Disease

## 2021-02-18 VITALS — BP 120/84 | HR 83 | Temp 97.2°F | Ht 65.5 in | Wt 113.1 lb

## 2021-02-18 DIAGNOSIS — A310 Pulmonary mycobacterial infection: Secondary | ICD-10-CM

## 2021-02-18 DIAGNOSIS — J479 Bronchiectasis, uncomplicated: Secondary | ICD-10-CM | POA: Diagnosis not present

## 2021-02-18 DIAGNOSIS — J189 Pneumonia, unspecified organism: Secondary | ICD-10-CM | POA: Insufficient documentation

## 2021-02-18 MED ORDER — SPIRIVA RESPIMAT 2.5 MCG/ACT IN AERS: 2.0000 | INHALATION_SPRAY | Freq: Every day | RESPIRATORY_TRACT | 0 refills | Status: DC

## 2021-02-18 NOTE — Assessment & Plan Note (Signed)
Repeat sputum AFB now that she finally has some productive sputum to establish whether MAC is still present or not

## 2021-02-18 NOTE — Assessment & Plan Note (Signed)
Infiltrate was only noted on CT and not on chest x-ray. Failed a Z-Pak and will complete course on Augmentin.  Doubt follow-up chest x-ray necessary since initial infiltrate was not seen on chest x-ray

## 2021-02-18 NOTE — Assessment & Plan Note (Addendum)
Continue airway clearance measures with Mucinex, she is unable to get hypertonic saline due to shortage, can use regular saline instead. Flutter valve. No evidence of worsening bronchiectasis on imaging Lung function has dropped in 05/2020, did not tolerate Anoro due to palpitations, we will trial Spiriva

## 2021-02-18 NOTE — Patient Instructions (Signed)
  Sputum culture and sputum AFB. Complete course of Augmentin.  Trial of Spiriva once daily  If no better, call us back and keep office visit for Beth for next month, otherwise follow-up in 3 months

## 2021-02-18 NOTE — Progress Notes (Signed)
° °  Subjective:    Patient ID: Diane Haney, female    DOB: [birth year], 75 y.o.   MRN: [id number]  HPI  62 yoex-smokerwith bronchiectasis and MAI  She smoked about 20 pack years before she quit in her early 68s  5/2005Bronchoscopy >>MAI (S- clarithro, eth, RIF 8.0, synergy positive for E/R) and fortuitum (S- cipro/smikacin/tigecycline).She began antibiotics May 2005 and was treated with ETH/Azithro and cipro until March 2007. Was seen by ID Johnnye Sima), for recurrent MAI with previous tx of MAI triple therapy rx.  Finished last course (31mo) in Feb 2016 .  PMH -stage I moderately differentiated invasive ductal carcinoma of the right breasts/p RT.  Idiopathic LLE DVT (Dx at age 75  Chief Complaint  Patient presents with   Follow-up    Pt stated that she got worse and ended up in the ER on Tuesday.  Her husband dropped a sputum specimen off at the office on Friday.     We noted drop in lung function 05/2020. Last office visit 11/2020 we started her on Anoro >> durations just like albuterol does not she was unable to use She called the office few days ago [place] breathing got worse and she ended up in the emergency room, reviewed ER records -Labs normal including WBC count. CT chest reviewed which shows faint infiltrate right upper lobe, treated with Augmentin for 7 days. She brought in a sputum cup and submitted on Friday but I do not see this resulted    Significant tests/ events reviewed  11/2020 HRCt >> Patchy bilateral bronchiectasis, volume loss, peribronchovascular nodularity and scattered mucoid impaction, mildly progressive from 11/24/2019  11/24/2019-CT chest without contrast-chronic changes of MAC, stable mild emphysematous changes, areas of bronchiectasis and chronic atelectasis,larger areas of nodularity in the right lung seen on the prior study have resolved and there are new areas of vague nodularity.  CT chest 9/2020chronic  changes,Several new large nodules are identified predominantly within the right lung measuring up to 1.6 cm. Likely post infectious or inflammatory in etiology  HRCT Chest 08/2018 >>stable compared to 2018  CT 01/2015>Interval slight progression in right apical consolidation with air bronchograms, probably progression of radiation fibrosis. The left apical component is unchanged.  - stable chronic lung disease with bronchiectasis,scarring and peribronchial nodularity    6/2021PFTsmild restriction with mild diffusion defect/FEV1 1.81 (77%), ratio 73, DLCO 15.53 (74%)>>drop in FEV1 from 2.16-1.81 and drop in FVC from 2.71-2.48 10/2018PFT-stable with FEV1 94% , ratio 76 , FVC 94%  2011 spirometry normal  Review of Systems neg for any significant sore throat, dysphagia, itching, sneezing, nasal congestion or excess/ purulent secretions, fever, chills, sweats, unintended wt loss, pleuritic or exertional cp, hempoptysis, orthopnea pnd or change in chronic leg swelling. Also denies presyncope, palpitations, heartburn, abdominal pain, nausea, vomiting, diarrhea or change in bowel or urinary habits, dysuria,hematuria, rash, arthralgias, visual complaints, headache, numbness weakness or ataxia.     Objective:   Physical Exam  Gen. Pleasant, thin woman, in no distress ENT - no thrush, no pallor/icterus,no post nasal drip Neck: No JVD, no thyromegaly, no carotid bruits Lungs: no use of accessory muscles, no dullness to percussion, decreased BL without rales or rhonchi  Cardiovascular: Rhythm regular, heart sounds  normal, no murmurs or gallops, no peripheral edema Musculoskeletal: No deformities, no cyanosis or clubbing        Assessment & Plan:

## 2021-02-20 LAB — CULTURE, BLOOD (SINGLE)
Culture: NO GROWTH
Special Requests: ADEQUATE

## 2021-02-22 ENCOUNTER — Other Ambulatory Visit: Payer: Self-pay

## 2021-02-22 ENCOUNTER — Ambulatory Visit: Payer: Medicare Other | Admitting: Internal Medicine

## 2021-02-23 ENCOUNTER — Other Ambulatory Visit: Payer: Self-pay

## 2021-02-23 ENCOUNTER — Ambulatory Visit (INDEPENDENT_AMBULATORY_CARE_PROVIDER_SITE_OTHER): Payer: Medicare Other | Admitting: *Deleted

## 2021-02-23 DIAGNOSIS — M810 Age-related osteoporosis without current pathological fracture: Secondary | ICD-10-CM

## 2021-02-23 MED ORDER — DENOSUMAB 60 MG/ML ~~LOC~~ SOSY
60.0000 mg | PREFILLED_SYRINGE | Freq: Once | SUBCUTANEOUS | Status: AC
Start: 2021-02-23 — End: 2021-02-23
  Administered 2021-02-23: 60 mg via SUBCUTANEOUS

## 2021-02-23 NOTE — Progress Notes (Signed)
Pls cosign for Prolia inj../lmb  

## 2021-02-28 NOTE — Telephone Encounter (Signed)
OK to cancel appt with BW & reschedule for 3 months Can change from albuterol to combivent which may be cheaper & have spiriva like med in it + albuterol if she is willing (whenever her current albuterol runs out)

## 2021-02-28 NOTE — Telephone Encounter (Signed)
Dr. Elsworth Soho, please see mychart message sent by pt and advise:  To: [facility]OL    From: TARHONDA HOLLENBERG    Created: 02/28/2021 8:54 AM     *-*-*This message was handled on 02/28/2021 11:33 AM by Ketura Sirek P*-*-*  I have tried the Spiriva and it did give some relief. However it is not on my Part D plan and is not affordable at over $100 a month. Also I have tried to produce  2 more sputum samples without succeeding so am now out of sputum containers. I still have my appointment with [facility] on April 5th. Do you want me to cancel that a schedule for 3 months?  [name]

## 2021-03-01 NOTE — Telephone Encounter (Signed)
Dr. Elsworth Soho, Please see message regarding sputum result and advise.  Thank you.

## 2021-03-02 ENCOUNTER — Other Ambulatory Visit: Payer: Self-pay

## 2021-03-02 DIAGNOSIS — A310 Pulmonary mycobacterial infection: Secondary | ICD-10-CM

## 2021-03-02 NOTE — Progress Notes (Signed)
Called and went over lab result per Dr Elsworth Soho with patient. All questions answered and patient expressed full understanding and ok with ID consult. Order placed per Dr Elsworth Soho. Nothing further needed at this time.

## 2021-03-02 NOTE — Progress Notes (Signed)
b

## 2021-03-09 ENCOUNTER — Other Ambulatory Visit: Payer: Self-pay

## 2021-03-09 ENCOUNTER — Encounter: Payer: Self-pay | Admitting: Infectious Disease

## 2021-03-09 ENCOUNTER — Ambulatory Visit (INDEPENDENT_AMBULATORY_CARE_PROVIDER_SITE_OTHER): Payer: Medicare Other | Admitting: Infectious Disease

## 2021-03-09 VITALS — BP 138/78 | HR 63 | Temp 97.5°F | Ht 65.0 in | Wt 113.0 lb

## 2021-03-09 DIAGNOSIS — Z881 Allergy status to other antibiotic agents status: Secondary | ICD-10-CM

## 2021-03-09 DIAGNOSIS — J471 Bronchiectasis with (acute) exacerbation: Secondary | ICD-10-CM

## 2021-03-09 DIAGNOSIS — A310 Pulmonary mycobacterial infection: Secondary | ICD-10-CM | POA: Diagnosis not present

## 2021-03-09 DIAGNOSIS — R053 Chronic cough: Secondary | ICD-10-CM | POA: Diagnosis not present

## 2021-03-09 NOTE — Progress Notes (Signed)
For infectious disease consult: Mycobacterium avium infection  Requesting physician: Dr Elsworth Soho  PCP: Cathlean Cower, MD  Subjective:    Patient ID: Diane Haney, female    DOB: 1946/09/16, 75 y.o.   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  Previously followed by my partner Dr. Johnnye Sima and treated for both Mycobacterium avium and M. chelonae then she had recurrence of symptoms and was positive for Mycobacterium avium again and treated for M avium with azithromycin rifampin and ethambutol.  Was last seen by Dr. Johnnye Sima in 2016.  She seemed to be doing relatively well and is continue to follow with [facility] and Dr. Kara Mead   Early March she was having worsening cough and called in [facility].  An AFB sputum culture was prescribed and she was able to get up sputum which she says she normally does not do very well.  Azithromycin was prescribed and she did not start this until after she had produced sputum.  The azithromycin did not help her and ultimately she went to the emergency department where she was seen in CT scan showed an infiltrate.  She was given Augmentin which improved her symptoms.  In the interim the AFB stain and culture came back positive on probe for M avium.  She has been referred back to [place] for evaluation for pathology potential treatment of M avium infection.  In talking to her she tells me that her cough has continued to improve after receiving the Augmentin,  She says that while she is coughing more after recovering from pneumonia she is not coughing especially more than in the past.  She does not produce sputum at all she says.  She has not lost weight.  She does have CT scan findings of bronchiectasis.  She is not eager at all to take the drugs that she is  taken before in the past for M avium infection as she did not tolerate them very well.  In my view it is not clear that she meets criteria for treatment for M avium and she is certainly herself does not want to start treatment.  We will plan on seeing her back in several months time to see how she is doing clinically and certainly should her cough be worsening or she have other symptoms such as weight loss we can certainly initiate therapy at that time.     Past Medical History:  Diagnosis Date  . Breast cancer ([place]) 11/12/2012   Dx oct 2013 - [place] - Invasive ductal carcinoma, s/o bilat mastectomy with + margin  - also for XRT soon  . Bronchiectasis   . Chronic fatigue fibromyalgia syndrome 11/12/2012  . COPD (chronic obstructive pulmonary disease) ([place])   . DVT, lower extremity (H[facility])    LLE  . Fibromyalgia   . GERD (gastroesophageal reflux disease)   . History of shingles   . Hyperlipidemia   . Hypertension   . Impaired glucose tolerance 11/14/2013  . Migraine   . Osteopenia   . Osteoporosis 10/16/2007   Qualifier: Diagnosis of  By: Jenny Reichmann MD, Hunt Oris   . Pulmonary Mycobacterium avium complex (MAC) infection ([place])   . Squamous cell skin cancer, multiple sites     Past Surgical History:  Procedure Laterality Date  . FOOT SURGERY    . mastectomy bilateral oct  2013    . shoulder impingement    . VIDEO BRONCHOSCOPY N/A 05/07/2014   Procedure: VIDEO BRONCHOSCOPY WITHOUT FLUORO;  Surgeon: Elsie Stain, MD;  Location: [facility];  Service: Cardiopulmonary;  Laterality: N/A;    Family History  Problem Relation Age of Onset  . Lung cancer Father   . Colon polyps Father   . Heart disease Other        grandparents  . Breast cancer Other   . Hyperlipidemia Other   . Hypertension Other   . Other Other        alcholism / addiction      Social History   Socioeconomic History  . Marital status: Married    Spouse name: Not on file  . Number of  children: 3  . Years of education: Not on file  . Highest education level: Not on file  Occupational History  . Occupation: previously part-time MD's office  Tobacco Use  . Smoking status: Former Smoker    Packs/day: 1.00    Years: 25.00    Pack years: 25.00    Types: Cigarettes    Quit date: 12/11/1985    Years since quitting: 35.2  . Smokeless tobacco: Never Used  . Tobacco comment: pt does not smoke  Vaping Use  . Vaping Use: Never used  Substance and Sexual Activity  . Alcohol use: No    Comment: occasional  . Drug use: No  . Sexual activity: Not on file  Other Topics Concern  . Not on file  Social History Narrative   Has not worked since apprx 1990   Social Determinants of Radio broadcast assistant Strain: Not on [employer] Insecurity: Not on file  Transportation Needs: Not on file  Physical Activity: Not on file  Stress: Not on file  Social Connections: Not on file    Allergies  Allergen Reactions  . Losartan     headache  . Adhesive [Tape]     Blisters  . Ciprofloxacin     REACTION: tendinitis  . Clarithromycin     REACTION: severe reflux  . Prednisone     REACTION: irregular heartbeat, not able to sleep     Current Outpatient Medications:  .  amLODipine (NORVASC) 5 MG tablet, Take 1 tablet (5 mg total) by mouth daily., Disp: 90 tablet, Rfl: 3 .  aspirin 81 MG EC tablet, Take 81 mg by mouth daily., Disp: , Rfl:  .  atorvastatin (LIPITOR) 20 MG tablet, Take 1 tablet (20 mg total) by mouth daily., Disp: 90 tablet, Rfl: 3 .  calcium citrate-vitamin D (CITRACAL+D) 315-200 MG-UNIT per tablet, Take 2 tablets by mouth daily. , Disp: , Rfl:  .  carisoprodol (SOMA) 350 MG tablet, Take 1 tablet (350 mg total) by mouth 2 (two) times daily as needed for muscle spasms., Disp: 180 tablet, Rfl: 1 .  diclofenac sodium (VOLTAREN) 1 % GEL, Apply 2 g topically 4 (four) times daily as needed., Disp: 100 g, Rfl: 5 .  gabapentin (NEURONTIN) 100 MG capsule, Take 1 capsule  (100 mg total) by mouth 3 (three) times daily., Disp: 270 capsule, Rfl: 1 .  metoprolol tartrate (LOPRESSOR) 25 MG tablet, Take 0.5 tablets (12.5 mg total) by mouth 2 (two) times daily. Pt taking .5 tab, Disp: 90 tablet, Rfl: 3 .  olmesartan-hydrochlorothiazide (BENICAR HCT) 20-12.5 MG tablet, Take 0.5 tablets by mouth daily., Disp: 45 tablet, Rfl: 3 .  Respiratory Therapy Supplies (FLUTTER) DEVI, Use as directed., Disp: 1  each, Rfl: 0 .  sodium chloride HYPERTONIC 3 % nebulizer solution, TAKE 2 ML VIA NEBULIZATION EVERY DAY (Patient taking differently: Using 0.9%), Disp: 240 mL, Rfl: 3 .  temazepam (RESTORIL) 15 MG capsule, TAKE 1 OR 2 CAPSULES BY MOUTH EVERY NIGHT AT BEDTIME AS NEEDED FOR SLEEP, Disp: 180 capsule, Rfl: 1 .  albuterol (VENTOLIN HFA) 108 (90 Base) MCG/ACT inhaler, Inhale 2 puffs into the lungs every 6 (six) hours as needed for wheezing or shortness of breath. (Patient not taking: Reported on 03/09/2021), Disp: 8 g, Rfl: 6 .  amoxicillin-clavulanate (AUGMENTIN) 875-125 MG tablet, Take 1 tablet by mouth 2 (two) times daily. (Patient not taking: Reported on 03/09/2021), Disp: 14 tablet, Rfl: 0 .  promethazine-dextromethorphan (PROMETHAZINE-DM) 6.25-15 MG/5ML syrup, Take 5 mLs by mouth 4 (four) times daily as needed for cough. (Patient not taking: Reported on 03/09/2021), Disp: 118 mL, Rfl: 0 .  Tiotropium Bromide Monohydrate (SPIRIVA RESPIMAT) 2.5 MCG/ACT AERS, Inhale 2 puffs into the lungs daily. (Patient not taking: Reported on 03/09/2021), Disp: 4 g, Rfl: 0 .  umeclidinium-vilanterol (ANORO ELLIPTA) 62.5-25 MCG/INH AEPB, Inhale 1 puff into the lungs daily. (Patient not taking: Reported on 03/09/2021), Disp: 1 each, Rfl: 0   Review of Systems  Constitutional: Negative for activity change, appetite change, chills, diaphoresis, fatigue, fever and unexpected weight change.  HENT: Positive for sore throat. Negative for congestion, rhinorrhea, sinus pressure, sneezing and trouble swallowing.    Eyes: Negative for photophobia and visual disturbance.  Respiratory: Positive for cough. Negative for chest tightness, shortness of breath, wheezing and stridor.   Cardiovascular: Negative for chest pain, palpitations and leg swelling.  Gastrointestinal: Negative for abdominal distention, abdominal pain, anal bleeding, blood in stool, constipation, diarrhea, nausea and vomiting.  Genitourinary: Negative for difficulty urinating, dysuria, flank pain and hematuria.  Musculoskeletal: Negative for arthralgias, back pain, gait problem, joint swelling and myalgias.  Skin: Negative for color change, pallor, rash and wound.  Neurological: Negative for dizziness, tremors, weakness and light-headedness.  Hematological: Negative for adenopathy. Does not bruise/bleed easily.  Psychiatric/Behavioral: Negative for agitation, behavioral problems, confusion, decreased concentration, dysphoric mood and sleep disturbance.       Objective:   Physical Exam Constitutional:      General: She is not in acute distress.    Appearance: She is not diaphoretic.  HENT:     Head: Normocephalic and atraumatic.     Right Ear: External ear normal.     Left Ear: External ear normal.     Nose: Nose normal.     Mouth/Throat:     Pharynx: No oropharyngeal exudate.  Eyes:     General: No scleral icterus.    Conjunctiva/sclera: Conjunctivae normal.     Pupils: Pupils are equal, round, and reactive to light.  Cardiovascular:     Rate and Rhythm: Normal rate and regular rhythm.     Heart sounds: Normal heart sounds. No murmur heard. No friction rub. No gallop.   Pulmonary:     Effort: Pulmonary effort is normal. Prolonged expiration present. No respiratory distress.     Breath sounds: Normal breath sounds. No wheezing or rales.  Abdominal:     General: Bowel sounds are normal. There is no distension.     Palpations: Abdomen is soft.     Tenderness: There is no abdominal tenderness. There is no rebound.   Musculoskeletal:        General: No tenderness. Normal range of motion.     Cervical back: Normal range of motion  and neck supple.  Lymphadenopathy:     Cervical: No cervical adenopathy.  Skin:    General: Skin is warm and dry.     Coloration: Skin is not pale.     Findings: No erythema or rash.  Neurological:     Mental Status: She is alert and oriented to person, place, and time.     Coordination: Coordination normal.  Psychiatric:        Judgment: Judgment normal.           Assessment & Plan:  History of M avium infection of the lungs with bronchiectasis: Note she also had an M. fortuitum isolated at one point in time:  I personally reviewed her images including CT scans which that showed an infiltrate though not seen on chest x-ray.  Bronchiectatic changes noted.  Labs reviewed.  I do not again find clear indication for treatment at this point in time but be happy to treat her in the future should her symptoms worsen and she is willing to start treatment.  Intolerance to ciprofloxacin with dependent apparent tendinitis: She does not recall this but we can look through the chart certainly do not want to have to use that drug in someone her of her age in any case.  Her clarithromycin allergy was an intolerance with the drug causing  significant nausea  I spent greater than 60 minutes with the patient including greater than 50% of time in face to face counsel of the patient re nature of M avium, NTM infections and criteria for treatment and in coordination of their care.

## 2021-03-15 ENCOUNTER — Ambulatory Visit: Payer: Medicare Other | Admitting: Pulmonary Disease

## 2021-03-15 ENCOUNTER — Ambulatory Visit: Payer: Medicare Other | Admitting: Primary Care

## 2021-03-17 ENCOUNTER — Ambulatory Visit: Payer: Medicare Other | Admitting: Adult Health

## 2021-03-21 DIAGNOSIS — I2510 Atherosclerotic heart disease of native coronary artery without angina pectoris: Secondary | ICD-10-CM | POA: Diagnosis not present

## 2021-03-21 DIAGNOSIS — R0600 Dyspnea, unspecified: Secondary | ICD-10-CM | POA: Diagnosis not present

## 2021-03-28 DIAGNOSIS — Z23 Encounter for immunization: Secondary | ICD-10-CM | POA: Diagnosis not present

## 2021-04-11 ENCOUNTER — Telehealth: Payer: Self-pay | Admitting: Internal Medicine

## 2021-04-11 NOTE — Telephone Encounter (Signed)
Called pt to schedule AWV with [facility]. Patient declined AWV.  

## 2021-05-10 ENCOUNTER — Telehealth: Payer: Self-pay | Admitting: Pulmonary Disease

## 2021-05-10 NOTE — Telephone Encounter (Signed)
I have called and spoke with Hildred Alamin at [facility]. She is faxing over the report but stated that they have identified this as MAC and that they are faxing the report over to the office.  Will forward this to RA to make him aware. Please advise. Thanks

## 2021-05-10 NOTE — Telephone Encounter (Signed)
Noted. Nothing further needed. Will close encounter.

## 2021-05-10 NOTE — Telephone Encounter (Signed)
Both patient and we are aware that she has persistent MAC in her sputum. But holding off on treatment until she clearly has worsening symptoms attributable to MAC Will forward to her ID physician

## 2021-05-11 LAB — AFB CULTURE WITH SMEAR (NOT AT ARMC)
Acid Fast Culture: POSITIVE — AB
Acid Fast Smear: NEGATIVE

## 2021-05-11 LAB — AFB ID BY DNA PROBE
M avium complex: POSITIVE — AB
M tuberculosis complex: NEGATIVE

## 2021-05-17 ENCOUNTER — Telehealth: Payer: Self-pay | Admitting: Primary Care

## 2021-05-17 ENCOUNTER — Encounter: Payer: Self-pay | Admitting: Primary Care

## 2021-05-17 ENCOUNTER — Other Ambulatory Visit: Payer: Self-pay

## 2021-05-17 ENCOUNTER — Ambulatory Visit: Payer: Medicare Other | Admitting: Primary Care

## 2021-05-17 ENCOUNTER — Other Ambulatory Visit (HOSPITAL_COMMUNITY): Payer: Self-pay

## 2021-05-17 ENCOUNTER — Ambulatory Visit (INDEPENDENT_AMBULATORY_CARE_PROVIDER_SITE_OTHER): Payer: Medicare Other | Admitting: Primary Care

## 2021-05-17 VITALS — BP 112/68 | HR 62 | Temp 98.1°F | Ht 65.5 in | Wt 112.2 lb

## 2021-05-17 DIAGNOSIS — A310 Pulmonary mycobacterial infection: Secondary | ICD-10-CM | POA: Diagnosis not present

## 2021-05-17 DIAGNOSIS — R0602 Shortness of breath: Secondary | ICD-10-CM

## 2021-05-17 DIAGNOSIS — J479 Bronchiectasis, uncomplicated: Secondary | ICD-10-CM

## 2021-05-17 LAB — CBC WITH DIFFERENTIAL/PLATELET
Basophils Absolute: 0 10*3/uL (ref 0.0–0.1)
Basophils Relative: 0.5 % (ref 0.0–3.0)
Eosinophils Absolute: 0.1 10*3/uL (ref 0.0–0.7)
Eosinophils Relative: 1.2 % (ref 0.0–5.0)
HCT: 40.5 % (ref 36.0–46.0)
Hemoglobin: 13.7 g/dL (ref 12.0–15.0)
Lymphocytes Relative: 15.5 % (ref 12.0–46.0)
Lymphs Abs: 1.2 10*3/uL (ref 0.7–4.0)
MCHC: 33.8 g/dL (ref 30.0–36.0)
MCV: 95.9 fl (ref 78.0–100.0)
Monocytes Absolute: 1.1 10*3/uL — ABNORMAL HIGH (ref 0.1–1.0)
Monocytes Relative: 15.1 % — ABNORMAL HIGH (ref 3.0–12.0)
Neutro Abs: 5.1 10*3/uL (ref 1.4–7.7)
Neutrophils Relative %: 67.7 % (ref 43.0–77.0)
Platelets: 213 10*3/uL (ref 150.0–400.0)
RBC: 4.23 Mil/uL (ref 3.87–5.11)
RDW: 14.7 % (ref 11.5–15.5)
WBC: 7.6 10*3/uL (ref 4.0–10.5)

## 2021-05-17 MED ORDER — UMECLIDINIUM BROMIDE 62.5 MCG/INH IN AEPB: 1.0000 | INHALATION_SPRAY | Freq: Every day | RESPIRATORY_TRACT | 5 refills | Status: DC

## 2021-05-17 MED ORDER — SPIRIVA RESPIMAT 2.5 MCG/ACT IN AERS: 2.0000 | INHALATION_SPRAY | Freq: Every day | RESPIRATORY_TRACT | 0 refills | Status: DC

## 2021-05-17 NOTE — Telephone Encounter (Signed)
Once she has used up Spiriva please have her start Incruse. I have sent in prescription.

## 2021-05-17 NOTE — Telephone Encounter (Signed)
-----   Message from Rigoberto Noel, MD sent at 05/17/2021  1:36 PM EDT ----- Regarding: RE: bronchiectasis/MAI patient Start with CXR please, If neg then HRCT for worsening b-ectasis ----- Message ----- From: Martyn Ehrich, NP Sent: 05/17/2021  10:42 AM EDT To: Rigoberto Noel, MD Subject: bronchiectasis/MAI patient                     She had bronchopneumonia in March 2022 on CTA (did not show on regular imaging). Do you want me to repeat CT imaging. She has chills throughout the day, np cough in afternoon. New difficulty taking deep breath. She is not on maintenance inhaler d.t cost. I will work on this. Not currently on abx for MAC. Seeing ID in August.   -Beth

## 2021-05-17 NOTE — Telephone Encounter (Signed)
Thanks [employer]

## 2021-05-17 NOTE — Telephone Encounter (Signed)
Can you please order CXR and ask patient to get this done sometime this week or next

## 2021-05-17 NOTE — Telephone Encounter (Signed)
Beth,  Please see patient comment and advise.  Thank you.

## 2021-05-17 NOTE — Assessment & Plan Note (Signed)
-   Patient has persistent MAC in sputum.  Not currently on abx. Following with infectious disease. She has completed 4 rounds or triple therapy treatment.  Holding off on fruther treatment unless symptoms decline.

## 2021-05-17 NOTE — Progress Notes (Signed)
_0  ID: Diane Haney, female    DOB: 1946/10/17, 75 y.o.   MRN: [id number]  Chief Complaint  Patient presents with  . Follow-up    SOB with exertion, fatigue    Referring provider: Biagio Borg, MD   5/2005Bronchoscopy >>MAI (S- clarithro, eth, RIF 8.0, synergy positive for E/R) and fortuitum (S- cipro/smikacin/tigecycline).She began antibiotics May 2005 and was treated with ETH/Azithro and cipro until March 2007. Was seen by ID Johnnye Sima), for recurrent MAI with previous tx of MAI triple therapy rx.  Finished last course (58mo) in Feb 2016 .  HPI: 75year old female, quit in January 1997 (25-pack-year history).  Past medical history significant for HTN, coronary artery calcification, bronchiectasis/MAI, GERD, breast cancer s/p RT, idiopathic LLE DVT, chronic fatigue, fibromyalgia. Patient of Dr. AElsworth Soho last seen in March 2022.    05/17/2021- Interim hx Patient presents today for 3 month follow-up. She has a non-productive cough, mainly in the afternoon. Associated wheezing with coughing fits. She had pneumonia in March 2022 which was noted on CTA imaging. Patient has persistent MAC in sputum.  Not currently on antibiotics. Following with infectious disease. She has had three rounds of triple therapy treatment. No evidence of worsening bronchiectasis on imaging. Lung function was noted to have dropped in June 2021, she did not tolerate ANORO d/t palpitation so she was given trial Spiriva. She is using salines nebulizer and vest twice a day along with flutter valve. She will take mucinex as needed. She has shortness of breath which is not new. She does reports difficulty taking deep breath. Spiriva is not covered on her insurance. She is unsure if it helped.   Significant tests/ events reviewed  02/16/20 CTA- No pulmonary emboli. Newly seen acute patchy infiltrates in both lungs, more prominent in the right upper lung than the left, consistent with bronchopneumonia. This could be  bacterial or viral. No lobar consolidation or acute collapse. Chronic volume loss in both upper lobes. Chronic scarring and bronchiectasis in the right middle lobe and lingula  11/2020 HRCt >>Patchy bilateral bronchiectasis, volume loss, peribronchovascularnodularity and scattered mucoid impaction, mildly progressive from 11/24/2019  11/24/2019-CT chest without contrast-chronic changes of MAC, stable mild emphysematous changes, areas of bronchiectasis and chronic atelectasis,larger areas of nodularity in the right lung seen on the prior study have resolved and there are new areas of vague nodularity.  CT chest 9/2020chronic changes,Several new large nodules are identified predominantly within the right lung measuring up to 1.6 cm. Likely post infectious or inflammatory in etiology  HRCT Chest 08/2018 >>stable compared to 2018  CT 01/2015>Interval slight progression in right apical consolidation with air bronchograms, probably progression of radiation fibrosis. The left apical component is unchanged.  - stable chronic lung disease with bronchiectasis,scarring and peribronchial nodularity    Pulmonary function testing:  6/2021PFTsmild restriction with mild diffusion defect/FEV1 1.81 (77%), ratio 73, DLCO 15.53 (74%)>>drop in FEV1 from 2.16-1.81 and drop in FVC from 2.71-2.48  10/2018PFT-stable with FEV1 94% , ratio 76 , FVC 94%  2011 spirometry normal  Allergies  Allergen Reactions  . Losartan     headache  . Adhesive [Tape]     Blisters  . Ciprofloxacin     REACTION: tendinitis  . Clarithromycin     REACTION: severe reflux  . Prednisone     REACTION: irregular heartbeat, not able to sleep    Immunization History  Administered Date(s) Administered  . Fluad Quad(high Dose 65+) 08/29/2019, 09/03/2020  . Influenza,inj,Quad PF,6+ Mos 08/25/2013, 07/29/2014, 09/04/2018  .  Influenza-Unspecified 08/09/2016, 08/22/2017, 08/29/2019  . PFIZER(Purple Top)SARS-COV-2  Vaccination 01/07/2020, 01/28/2020, 09/10/2020  . Pneumococcal Conjugate-13 11/14/2013  . Pneumococcal Polysaccharide-23 10/11/2006, 11/12/2012  . Td 12/12/2003, 09/08/2015  . Tdap 09/08/2015    Past Medical History:  Diagnosis Date  . Breast cancer ([place]) 11/12/2012   Dx oct 2013 - [place] - Invasive ductal carcinoma, s/o bilat mastectomy with + margin  - also for XRT soon  . Bronchiectasis   . Chronic fatigue fibromyalgia syndrome 11/12/2012  . COPD (chronic obstructive pulmonary disease) ([place])   . DVT, lower extremity ([facility])    LLE  . Fibromyalgia   . GERD (gastroesophageal reflux disease)   . History of shingles   . Hyperlipidemia   . Hypertension   . Impaired glucose tolerance 11/14/2013  . Migraine   . Osteopenia   . Osteoporosis 10/16/2007   Qualifier: Diagnosis of  By: Jenny Reichmann MD, Hunt Oris   . Pulmonary Mycobacterium avium complex (MAC) infection ([place])   . Squamous cell skin cancer, multiple sites     Tobacco History: Social History   Tobacco Use  Smoking Status Former Smoker  . Packs/day: 1.00  . Years: 25.00  . Pack years: 25.00  . Types: Cigarettes  . Quit date: 12/11/1985  . Years since quitting: 35.4  Smokeless Tobacco Never Used  Tobacco Comment   pt does not smoke   Counseling given: Not Answered Comment: pt does not smoke   Outpatient Medications Prior to Visit  Medication Sig Dispense Refill  . amLODipine (NORVASC) 5 MG tablet Take 1 tablet (5 mg total) by mouth daily. 90 tablet 3  . aspirin 81 MG EC tablet Take 81 mg by mouth daily.    Marland Kitchen atorvastatin (LIPITOR) 20 MG tablet Take 1 tablet (20 mg total) by mouth daily. 90 tablet 3  . calcium citrate-vitamin D (CITRACAL+D) 315-200 MG-UNIT per tablet Take 2 tablets by mouth daily.     . carisoprodol (SOMA) 350 MG tablet Take 1 tablet (350 mg total) by mouth 2 (two) times daily as needed for muscle spasms. 180 tablet 1  . diclofenac sodium (VOLTAREN) 1 % GEL Apply 2 g topically 4 (four)  times daily as needed. 100 g 5  . gabapentin (NEURONTIN) 100 MG capsule Take 1 capsule (100 mg total) by mouth 3 (three) times daily. 270 capsule 1  . olmesartan-hydrochlorothiazide (BENICAR HCT) 20-12.5 MG tablet Take 0.5 tablets by mouth daily. 45 tablet 3  . Respiratory Therapy Supplies (FLUTTER) DEVI Use as directed. 1 each 0  . sodium chloride HYPERTONIC 3 % nebulizer solution TAKE 2 ML VIA NEBULIZATION EVERY DAY (Patient taking differently: Using 0.9%) 240 mL 3  . temazepam (RESTORIL) 15 MG capsule TAKE 1 OR 2 CAPSULES BY MOUTH EVERY NIGHT AT BEDTIME AS NEEDED FOR SLEEP 180 capsule 1  . albuterol (VENTOLIN HFA) 108 (90 Base) MCG/ACT inhaler Inhale 2 puffs into the lungs every 6 (six) hours as needed for wheezing or shortness of breath. (Patient not taking: No sig reported) 8 g 6  . metoprolol tartrate (LOPRESSOR) 25 MG tablet Take 0.5 tablets (12.5 mg total) by mouth 2 (two) times daily. Pt taking .5 tab 90 tablet 3  . promethazine-dextromethorphan (PROMETHAZINE-DM) 6.25-15 MG/5ML syrup Take 5 mLs by mouth 4 (four) times daily as needed for cough. (Patient not taking: No sig reported) 118 mL 0  . umeclidinium-vilanterol (ANORO ELLIPTA) 62.5-25 MCG/INH AEPB Inhale 1 puff into the lungs daily. (Patient not taking: No sig reported) 1 each 0  .  amoxicillin-clavulanate (AUGMENTIN) 875-125 MG tablet Take 1 tablet by mouth 2 (two) times daily. (Patient not taking: Reported on 03/09/2021) 14 tablet 0  . Tiotropium Bromide Monohydrate (SPIRIVA RESPIMAT) 2.5 MCG/ACT AERS Inhale 2 puffs into the lungs daily. (Patient not taking: Reported on 03/09/2021) 4 g 0   No facility-administered medications prior to visit.    Review of Systems  Review of Systems  Constitutional: Negative.   HENT: Negative.   Respiratory: Positive for cough, shortness of breath and wheezing. Negative for chest tightness.      Physical Exam  BP 112/68 (BP Location: Left Arm, Cuff Size: Normal)   Pulse 62   Temp 98.1 F  (36.7 C) (Temporal)   Ht 5' 5.5" (1.664 m)   Wt 112 lb 3.2 oz (50.9 kg)   SpO2 99% Comment: RA  BMI 18.39 kg/m  Physical Exam Constitutional:      Appearance: Normal appearance.  HENT:     Head: Normocephalic and atraumatic.  Cardiovascular:     Rate and Rhythm: Normal rate and regular rhythm.  Pulmonary:     Effort: Pulmonary effort is normal.     Breath sounds: Normal breath sounds. No wheezing or rales.  Skin:    General: Skin is warm and dry.  Neurological:     General: No focal deficit present.     Mental Status: She is alert and oriented to person, place, and time. Mental status is at baseline.  Psychiatric:        Mood and Affect: Mood normal.        Behavior: Behavior normal.        Thought Content: Thought content normal.        Judgment: Judgment normal.      Lab Results:  CBC    Component Value Date/Time   WBC 7.6 05/17/2021 1050   RBC 4.23 05/17/2021 1050   HGB 13.7 05/17/2021 1050   HCT 40.5 05/17/2021 1050   PLT 213.0 05/17/2021 1050   MCV 95.9 05/17/2021 1050   MCH 32.1 02/15/2021 1049   MCHC 33.8 05/17/2021 1050   RDW 14.7 05/17/2021 1050   LYMPHSABS 1.2 05/17/2021 1050   MONOABS 1.1 (H) 05/17/2021 1050   EOSABS 0.1 05/17/2021 1050   BASOSABS 0.0 05/17/2021 1050    BMET    Component Value Date/Time   NA 140 02/15/2021 1049   K 4.2 02/15/2021 1049   CL 101 02/15/2021 1049   CO2 32 02/15/2021 1049   GLUCOSE 105 (H) 02/15/2021 1049   BUN 14 02/15/2021 1049   CREATININE 0.85 02/15/2021 1049   CALCIUM 10.0 02/15/2021 1049   GFRNONAA >60 02/15/2021 1049   GFRAA 93 09/24/2008 1044    BNP No results found for: BNP  ProBNP No results found for: PROBNP  Imaging: No results found.   Assessment & Plan:   Mycobacterium avium-intracellulare infection ([place]) - Patient has persistent MAC in sputum.  Not currently on abx. Following with infectious disease. She has completed 4 rounds or triple therapy treatment.  Holding off on fruther  treatment unless symptoms decline.   BRONCHIECTASIS - Patient has a chronic np cough. She reports difficulty taking deep breaths along with dyspnea symptoms. She was noted to have a decline in her lung function in June 2021. Not current on Spiriva d/t insurance coverage, she is intolerant to LABA portion as this causes palpitatoins. She is compliant with Saline neb, flutter valve and therapy vest twice daily. Uses mucinex as needed. We will given patient sample of  Spiriva respimat today. Pharmacy did benefits investigation and Incruse is covered and will be $45 copay     Martyn Ehrich, NP 05/17/2021

## 2021-05-17 NOTE — Assessment & Plan Note (Addendum)
-   Patient has a chronic np cough. She reports difficulty taking deep breaths along with dyspnea symptoms. She was noted to have a decline in her lung function in June 2021. Not current on Spiriva d/t insurance coverage, she is intolerant to LABA portion as this causes palpitatoins. She is compliant with Saline neb, flutter valve and therapy vest twice daily. Uses mucinex as needed. We will given patient sample of Spiriva respimat today. Pharmacy did benefits investigation and Incruse is covered and will be $45 copay. FU in 3 months.

## 2021-05-17 NOTE — Telephone Encounter (Signed)
I looked in to all the options, the only LAMA that is covered is Incruse Ellipta 62.5 mcg and carries a copay of $45.   Pt may be able to obtain additional assistance through www.gskforyou.com and filling out the eligibility questionnaire (will need to provide info such as household number, monthly or yearly income, etc.).

## 2021-05-17 NOTE — Patient Instructions (Addendum)
Recommendations: - Continue Spiriva 2.81mcg- take two puffs in morning - Continue airway clearance (saline neb, vest and flutter valve) - Let us know if you need saline neb prescription sent to another pharmacy - I have sent a message to Dr. Elsworth Soho to see if he wants additional imaging - Notify our office if you develop increase mucus production, purulent mucus, fever, chest discomfort  Orders: - Labs today (CBC with diff)  Follow-up: - 3 months with Dr. Elsworth Soho

## 2021-05-17 NOTE — Telephone Encounter (Signed)
Called and spoke with patient, advised of need to have a cxr this week or next.  Patient wanted to know if she could have it done at [place].  Advised she could if that was closer for her and we would be able to see the results.  Advised she did not need an appointment.  She was asking about her labwork that was drawn today, advised that if there was anything concerning Diane Haney would have her nurse call her with results/recommendations.  She verbalized understanding.  Nothing further needed.

## 2021-05-17 NOTE — Telephone Encounter (Signed)
Can you run a benefits investigation for LAMA

## 2021-05-18 ENCOUNTER — Ambulatory Visit (HOSPITAL_BASED_OUTPATIENT_CLINIC_OR_DEPARTMENT_OTHER)
Admission: RE | Admit: 2021-05-18 | Discharge: 2021-05-18 | Disposition: A | Discharge status: Home / Self Care | Payer: Medicare Other | Source: Ambulatory Visit | Attending: Primary Care | Admitting: Primary Care

## 2021-05-18 DIAGNOSIS — R6883 Chills (without fever): Secondary | ICD-10-CM | POA: Diagnosis not present

## 2021-05-18 DIAGNOSIS — R0602 Shortness of breath: Secondary | ICD-10-CM | POA: Diagnosis not present

## 2021-05-18 DIAGNOSIS — R059 Cough, unspecified: Secondary | ICD-10-CM | POA: Diagnosis not present

## 2021-05-20 ENCOUNTER — Encounter: Payer: Self-pay | Admitting: *Deleted

## 2021-05-20 NOTE — Progress Notes (Signed)
CXR was normal.

## 2021-05-23 NOTE — Telephone Encounter (Signed)
Monocytes Absolute was just mildly elevated at 1.1 (normal range 0.1-1.0). Her previous testing has been between 0.8-1.0. It can be associated with chronic infection/inflammation likely related to her hx MAC. It is not high enough to be acutely concerned. Would recommend repeating CBC with diff at her next visit.

## 2021-05-23 NOTE — Telephone Encounter (Signed)
Diane Haney will you please advise on the follow My Chart message:   I still have not heard back on the blood test results done on June 7th. My monocytes were elevated and I wanted to know if that is of concern as they have never been elevated in the past.   Diane Haney  Thank you

## 2021-05-23 NOTE — Telephone Encounter (Signed)
Results sent to pt via My Chart. Nothing further needed at this time.

## 2021-06-21 ENCOUNTER — Ambulatory Visit: Payer: Medicare Other | Admitting: Infectious Disease

## 2021-06-21 ENCOUNTER — Other Ambulatory Visit: Payer: Self-pay | Admitting: Internal Medicine

## 2021-06-21 MED ORDER — GABAPENTIN 100 MG PO CAPS: 100.0000 mg | ORAL_CAPSULE | Freq: Three times a day (TID) | ORAL | 1 refills | Status: DC

## 2021-06-23 ENCOUNTER — Encounter: Payer: Self-pay | Admitting: Internal Medicine

## 2021-07-12 ENCOUNTER — Encounter: Payer: Self-pay | Admitting: Infectious Disease

## 2021-07-12 ENCOUNTER — Ambulatory Visit (INDEPENDENT_AMBULATORY_CARE_PROVIDER_SITE_OTHER): Payer: Medicare Other | Admitting: Infectious Disease

## 2021-07-12 ENCOUNTER — Other Ambulatory Visit: Payer: Self-pay

## 2021-07-12 VITALS — BP 136/73 | HR 73 | Temp 97.8°F | Wt 112.0 lb

## 2021-07-12 DIAGNOSIS — J479 Bronchiectasis, uncomplicated: Secondary | ICD-10-CM | POA: Diagnosis not present

## 2021-07-12 DIAGNOSIS — C50919 Malignant neoplasm of unspecified site of unspecified female breast: Secondary | ICD-10-CM | POA: Diagnosis not present

## 2021-07-12 DIAGNOSIS — R918 Other nonspecific abnormal finding of lung field: Secondary | ICD-10-CM

## 2021-07-12 DIAGNOSIS — A310 Pulmonary mycobacterial infection: Secondary | ICD-10-CM | POA: Diagnosis not present

## 2021-07-12 DIAGNOSIS — K219 Gastro-esophageal reflux disease without esophagitis: Secondary | ICD-10-CM | POA: Diagnosis not present

## 2021-07-12 DIAGNOSIS — I10 Essential (primary) hypertension: Secondary | ICD-10-CM

## 2021-07-12 NOTE — Progress Notes (Signed)
Complaints: Still having cough at 1 point had some blood in her sputum has times where she has chills feels cold and fatigued Subjective:    Patient ID: Diane Haney, female    DOB: [birth year], 75 y.o.   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  Previously followed by my partner Dr. Johnnye Sima and treated for both Mycobacterium avium and M. fortuitum.then she had recurrence of symptoms and was positive for Mycobacterium avium again and treated for M avium with azithromycin rifampin and ethambutol.  Was last seen by Dr. Johnnye Sima in 2016.  She seemed to be doing relatively well and is continue to follow with pulmonary and Dr. Kara Mead   Early March 2022 she was having worsening cough and called in lobe our pulmonary.  An AFB sputum culture was prescribed and she was able to get up sputum which she says she normally does not do very well.  Azithromycin was prescribed and she did not start this until after she had produced sputum.  The azithromycin did not help her and ultimately she went to the emergency department where she was seen in CT scan showed an infiltrate.  She was given Augmentin which improved her symptoms.  In the interim the AFB stain and culture came back positive on probe for M avium.  She has been referred back to [place] for evaluation for pathology potential treatment of M avium infection.  In talking to her at her last visit she indicated that her cough is continue to improve after completing Augmentin.  She says that while she is coughing more after recovering from pneumonia she is not coughing especially more than in the past.  As I last saw her she states that she has been coughing more.  Cough is more at night than in the morning.  At times she has chest tightness she also endorses  periods where she may have chills for up to a week and when she measures her temperature it is in the 97 degree range.  She is not losing weight.  She does not endorse anorexia.  She has a very hoarse voice and had written down most of her concerns today rather than trying to voice them.  Due to her worsening cough I was willing to restart therapy but she is not at all ready to start therapy at this point in time.   Past Medical History:  Diagnosis Date   Breast cancer ([facility]) 11/12/2012   Dx oct 2013 - [place] - Invasive ductal carcinoma, s/o bilat mastectomy with + margin  - also for XRT soon   Bronchiectasis    Chronic fatigue fibromyalgia syndrome 11/12/2012   COPD (chronic obstructive pulmonary disease) (HCC)    DVT, lower extremity (HCC)    LLE   Fibromyalgia    GERD (gastroesophageal reflux disease)    History of shingles    Hyperlipidemia    Hypertension    Impaired glucose tolerance 11/14/2013   Migraine    Osteopenia    Osteoporosis 10/16/2007   Qualifier: Diagnosis of  By: Jenny Reichmann MD, Hunt Oris    Pulmonary Mycobacterium avium complex (MAC) infection ([facility])    Squamous cell skin cancer, multiple sites     Past Surgical History:  Procedure Laterality Date   FOOT SURGERY     mastectomy bilateral oct 2013  shoulder impingement     VIDEO BRONCHOSCOPY N/A 05/07/2014   Procedure: VIDEO BRONCHOSCOPY WITHOUT FLUORO;  Surgeon: Elsie Stain, MD;  Location: [facility];  Service: Cardiopulmonary;  Laterality: N/A;    Family History  Problem Relation Age of Onset   Lung cancer Father    Colon polyps Father    Heart disease Other        grandparents   Breast cancer Other    Hyperlipidemia Other    Hypertension Other    Other Other        alcholism / addiction      Social History   Socioeconomic History   Marital status: Married    Spouse name: Not on file   Number of children: 3   Years of education: Not on file   Highest education level: Not  on file  Occupational History   Occupation: previously part-time MD's office  Tobacco Use   Smoking status: Former    Packs/day: 1.00    Years: 25.00    Pack years: 25.00    Types: Cigarettes    Quit date: 12/11/1985    Years since quitting: 35.6   Smokeless tobacco: Never   Tobacco comments:    pt does not smoke  Vaping Use   Vaping Use: Never used  Substance and Sexual Activity   Alcohol use: No    Comment: occasional   Drug use: No   Sexual activity: Not on file  Other Topics Concern   Not on file  Social History Narrative   Has not worked since apprx 1990   Social Determinants of Radio broadcast assistant Strain: Not on file  Food Insecurity: Not on file  Transportation Needs: Not on file  Physical Activity: Not on file  Stress: Not on file  Social Connections: Not on file    Allergies  Allergen Reactions   Losartan     headache   Adhesive [Tape]     Blisters   Ciprofloxacin     REACTION: tendinitis   Clarithromycin     REACTION: severe reflux   Prednisone     REACTION: irregular heartbeat, not able to sleep     Current Outpatient Medications:    amLODipine (NORVASC) 5 MG tablet, Take 1 tablet (5 mg total) by mouth daily., Disp: 90 tablet, Rfl: 3   aspirin 81 MG EC tablet, Take 81 mg by mouth daily., Disp: , Rfl:    atorvastatin (LIPITOR) 20 MG tablet, Take 1 tablet (20 mg total) by mouth daily., Disp: 90 tablet, Rfl: 3   calcium citrate-vitamin D (CITRACAL+D) 315-200 MG-UNIT per tablet, Take 2 tablets by mouth daily. , Disp: , Rfl:    carisoprodol (SOMA) 350 MG tablet, Take 1 tablet (350 mg total) by mouth 2 (two) times daily as needed for muscle spasms., Disp: 180 tablet, Rfl: 1   gabapentin (NEURONTIN) 100 MG capsule, Take 1 capsule (100 mg total) by mouth 3 (three) times daily., Disp: 270 capsule, Rfl: 1   olmesartan-hydrochlorothiazide (BENICAR HCT) 20-12.5 MG tablet, Take 0.5 tablets by mouth daily., Disp: 45 tablet, Rfl: 3    promethazine-dextromethorphan (PROMETHAZINE-DM) 6.25-15 MG/5ML syrup, Take 5 mLs by mouth 4 (four) times daily as needed for cough., Disp: 118 mL, Rfl: 0   Respiratory Therapy Supplies (FLUTTER) DEVI, Use as directed., Disp: 1 each, Rfl: 0   sodium chloride HYPERTONIC 3 % nebulizer solution, TAKE 2 ML VIA NEBULIZATION EVERY DAY (Patient taking differently: Using 0.9%), Disp: 240 mL, Rfl: 3  temazepam (RESTORIL) 15 MG capsule, TAKE 1 OR 2 CAPSULES BY MOUTH EVERY NIGHT AT BEDTIME AS NEEDED FOR SLEEP, Disp: 180 capsule, Rfl: 1   umeclidinium bromide (INCRUSE ELLIPTA) 62.5 MCG/INH AEPB, Inhale 1 puff into the lungs daily., Disp: 30 each, Rfl: 5   albuterol (VENTOLIN HFA) 108 (90 Base) MCG/ACT inhaler, Inhale 2 puffs into the lungs every 6 (six) hours as needed for wheezing or shortness of breath. (Patient not taking: No sig reported), Disp: 8 g, Rfl: 6   diclofenac sodium (VOLTAREN) 1 % GEL, Apply 2 g topically 4 (four) times daily as needed., Disp: 100 g, Rfl: 5   metoprolol tartrate (LOPRESSOR) 25 MG tablet, Take 0.5 tablets (12.5 mg total) by mouth 2 (two) times daily. Pt taking .5 tab, Disp: 90 tablet, Rfl: 3   Review of Systems  Constitutional:  Positive for chills and fatigue. Negative for activity change, appetite change, diaphoresis, fever and unexpected weight change.  HENT:  Positive for sore throat. Negative for congestion, rhinorrhea, sinus pressure, sneezing and trouble swallowing.   Eyes:  Negative for photophobia and visual disturbance.  Respiratory:  Positive for cough. Negative for chest tightness, shortness of breath, wheezing and stridor.   Cardiovascular:  Negative for chest pain, palpitations and leg swelling.  Gastrointestinal:  Negative for abdominal distention, abdominal pain, anal bleeding, blood in stool, constipation, diarrhea, nausea and vomiting.  Genitourinary:  Negative for difficulty urinating, dysuria, flank pain and hematuria.  Musculoskeletal:  Negative for  arthralgias, back pain, gait problem, joint swelling and myalgias.  Skin:  Negative for color change, pallor, rash and wound.  Neurological:  Negative for dizziness, tremors, weakness and light-headedness.  Hematological:  Negative for adenopathy. Does not bruise/bleed easily.  Psychiatric/Behavioral:  Negative for agitation, behavioral problems, confusion, decreased concentration, dysphoric mood and sleep disturbance.       Objective:   Physical Exam Constitutional:      General: She is not in acute distress.    Appearance: She is not diaphoretic.  HENT:     Head: Normocephalic and atraumatic.     Right Ear: External ear normal.     Left Ear: External ear normal.     Nose: Nose normal.     Mouth/Throat:     Pharynx: No oropharyngeal exudate.  Eyes:     General: No scleral icterus.    Extraocular Movements: Extraocular movements intact.     Conjunctiva/sclera: Conjunctivae normal.     Pupils: Pupils are equal, round, and reactive to light.  Cardiovascular:     Rate and Rhythm: Normal rate and regular rhythm.     Heart sounds: Normal heart sounds. No murmur heard.   No friction rub. No gallop.  Pulmonary:     Effort: Prolonged expiration present. No respiratory distress or retractions.     Breath sounds: No stridor. No wheezing, rhonchi or rales.  Chest:     Chest wall: No tenderness.  Abdominal:     General: Bowel sounds are normal. There is no distension.     Palpations: Abdomen is soft.     Tenderness: There is no abdominal tenderness. There is no rebound.  Musculoskeletal:        General: No tenderness. Normal range of motion.     Cervical back: Normal range of motion and neck supple.  Lymphadenopathy:     Cervical: No cervical adenopathy.  Skin:    General: Skin is warm and dry.     Coloration: Skin is not jaundiced or pale.  Findings: No bruising, erythema, lesion or rash.  Neurological:     General: No focal deficit present.     Mental Status: She is alert and  oriented to person, place, and time.     Coordination: Coordination normal.  Psychiatric:        Mood and Affect: Mood normal.        Behavior: Behavior normal.        Thought Content: Thought content normal.        Judgment: Judgment normal.          Assessment & Plan:  Mycobacterium Avium infection:  Note she had a chest x-ray performed on May 17 2021 when she had chills and a nonproductive cough.  I have read this film myself and agree that there is no evidence of a pulmonary infiltrate on this chest x-ray.  He also had seen Geraldo Pitter, NP with pulmonary that day and I have reviewed NP Walsh's note   She remains NOT READY to start medications  Chills and low temperatures: could certainly be coming from her M avium  HTN chronic condition well controlled Vitals:   07/12/21 1039  BP: 136/73  Pulse: 73  Temp: 97.8 F (36.6 C)  SpO2: 96%   Hoarse voice: not sure reason for this but had for several months

## 2021-07-19 DIAGNOSIS — L821 Other seborrheic keratosis: Secondary | ICD-10-CM | POA: Diagnosis not present

## 2021-07-19 DIAGNOSIS — L578 Other skin changes due to chronic exposure to nonionizing radiation: Secondary | ICD-10-CM | POA: Diagnosis not present

## 2021-07-19 DIAGNOSIS — L738 Other specified follicular disorders: Secondary | ICD-10-CM | POA: Diagnosis not present

## 2021-07-19 DIAGNOSIS — Z85828 Personal history of other malignant neoplasm of skin: Secondary | ICD-10-CM | POA: Diagnosis not present

## 2021-07-19 DIAGNOSIS — D1801 Hemangioma of skin and subcutaneous tissue: Secondary | ICD-10-CM | POA: Diagnosis not present

## 2021-07-19 DIAGNOSIS — L570 Actinic keratosis: Secondary | ICD-10-CM | POA: Diagnosis not present

## 2021-07-21 ENCOUNTER — Other Ambulatory Visit (HOSPITAL_BASED_OUTPATIENT_CLINIC_OR_DEPARTMENT_OTHER): Payer: Medicare Other

## 2021-07-26 ENCOUNTER — Other Ambulatory Visit: Payer: Self-pay

## 2021-07-26 ENCOUNTER — Ambulatory Visit (HOSPITAL_BASED_OUTPATIENT_CLINIC_OR_DEPARTMENT_OTHER)
Admission: RE | Admit: 2021-07-26 | Discharge: 2021-07-26 | Disposition: A | Discharge status: Home / Self Care | Payer: Medicare Other | Source: Ambulatory Visit | Attending: Internal Medicine | Admitting: Internal Medicine

## 2021-07-26 DIAGNOSIS — M8589 Other specified disorders of bone density and structure, multiple sites: Secondary | ICD-10-CM | POA: Diagnosis not present

## 2021-07-26 DIAGNOSIS — M810 Age-related osteoporosis without current pathological fracture: Secondary | ICD-10-CM | POA: Insufficient documentation

## 2021-07-31 ENCOUNTER — Encounter: Payer: Self-pay | Admitting: Internal Medicine

## 2021-08-23 DIAGNOSIS — D72821 Monocytosis (symptomatic): Secondary | ICD-10-CM | POA: Diagnosis not present

## 2021-08-23 DIAGNOSIS — Z9013 Acquired absence of bilateral breasts and nipples: Secondary | ICD-10-CM | POA: Diagnosis not present

## 2021-08-23 DIAGNOSIS — Z853 Personal history of malignant neoplasm of breast: Secondary | ICD-10-CM | POA: Diagnosis not present

## 2021-08-23 DIAGNOSIS — Z803 Family history of malignant neoplasm of breast: Secondary | ICD-10-CM | POA: Diagnosis not present

## 2021-08-23 DIAGNOSIS — Z08 Encounter for follow-up examination after completed treatment for malignant neoplasm: Secondary | ICD-10-CM | POA: Diagnosis not present

## 2021-08-23 DIAGNOSIS — M8550 Aneurysmal bone cyst, unspecified site: Secondary | ICD-10-CM | POA: Diagnosis not present

## 2021-08-23 DIAGNOSIS — M810 Age-related osteoporosis without current pathological fracture: Secondary | ICD-10-CM | POA: Diagnosis not present

## 2021-08-29 ENCOUNTER — Other Ambulatory Visit: Payer: Self-pay

## 2021-08-29 ENCOUNTER — Ambulatory Visit (INDEPENDENT_AMBULATORY_CARE_PROVIDER_SITE_OTHER): Payer: Medicare Other

## 2021-08-29 DIAGNOSIS — M810 Age-related osteoporosis without current pathological fracture: Secondary | ICD-10-CM

## 2021-08-29 MED ORDER — DENOSUMAB 60 MG/ML ~~LOC~~ SOSY
60.0000 mg | PREFILLED_SYRINGE | Freq: Once | SUBCUTANEOUS | Status: AC
  Administered 2021-08-29: 60 mg via SUBCUTANEOUS

## 2021-08-30 ENCOUNTER — Ambulatory Visit (INDEPENDENT_AMBULATORY_CARE_PROVIDER_SITE_OTHER): Payer: Medicare Other | Admitting: Pulmonary Disease

## 2021-08-30 ENCOUNTER — Encounter: Payer: Self-pay | Admitting: Pulmonary Disease

## 2021-08-30 DIAGNOSIS — Z23 Encounter for immunization: Secondary | ICD-10-CM | POA: Diagnosis not present

## 2021-08-30 DIAGNOSIS — J479 Bronchiectasis, uncomplicated: Secondary | ICD-10-CM | POA: Diagnosis not present

## 2021-08-30 DIAGNOSIS — A319 Mycobacterial infection, unspecified: Secondary | ICD-10-CM | POA: Diagnosis not present

## 2021-08-30 MED ORDER — ALBUTEROL SULFATE (2.5 MG/3ML) 0.083% IN NEBU: 2.5000 mg | INHALATION_SOLUTION | Freq: Four times a day (QID) | RESPIRATORY_TRACT | 2 refills | Status: DC | PRN

## 2021-08-30 MED ORDER — SODIUM CHLORIDE 3 % IN NEBU: INHALATION_SOLUTION | RESPIRATORY_TRACT | 12 refills | Status: DC | PRN

## 2021-08-30 NOTE — Progress Notes (Signed)
   Subjective:    Patient ID: Diane Haney, female    DOB: Nov 08, 1946, 75 y.o.   MRN: [id number]  HPI 75 yo ex-smoker with bronchiectasis and MAI  She smoked about 20 pack years before she quit in her early 15s   5/ 2005 Bronchoscopy >> MAI (S- clarithro, eth, RIF 8.0, synergy positive for E/R) and fortuitum (S- cipro/smikacin/tigecycline).She began antibiotics May 2005 and was treated with ETH/Azithro and cipro until March 2007. Was seen  by ID Johnnye Sima) , for recurrent MAI with previous tx of MAI triple therapy rx.  Finished last course (12mo) in Feb 2016 .   PMH -  stage I moderately differentiated invasive ductal carcinoma of the right breast s/p RT.  Idiopathic LLE DVT (Dx at age 75   noted drop in lung function 05/2020, did not tolerate anoro We started spiriva 02/2021 , now on incruse  02/2021 ED visit - Infiltrate was only noted on CT and not on chest x-ray. Failed a Z-Pak and completed course on Augmentin  CXR 05/2021 clear  She has seen ID twice and has discussed going back on MAC therapy. She has wheezing in the afternoons, nonproductive cough.  She is still unable to get hypertonic saline.  On her last visit we prescribed her Spiriva but now she is on Incruse.  She is still unable to get 3% saline solution and is using normal saline.  Mucinex upsets her stomach  Significant tests/ events reviewed  11/2020 HRCt >> Patchy bilateral bronchiectasis, volume loss, peribronchovascular nodularity and scattered mucoid impaction, mildly progressive from 11/24/2019   11/24/2019-CT chest without contrast-chronic changes of MAC,  stable mild emphysematous changes, areas of bronchiectasis and chronic atelectasis, larger areas of nodularity in the right lung seen on the prior study have resolved and there are new areas of vague nodularity.   CT chest 08/2019 chronic changes , Several new large nodules are identified predominantly within the right lung measuring up to 1.6 cm. Likely post  infectious or inflammatory in etiology   HRCT Chest 08/2018 >> stable compared to 2018   CT 01/2015>Interval slight progression in right apical consolidation with air bronchograms, probably progression of radiation fibrosis. The left apical component is unchanged.   - stable chronic lung disease with bronchiectasis,scarring and peribronchial nodularity       05/2020 PFTs  mild restriction with mild diffusion defect/ FEV1 1.81 (77%), ratio 73, DLCO 15.53 (74%) >> drop in FEV1 from 2.16-1.81 and drop in FVC from 2.71-2.48  09/2017  PFT-stable with FEV1 94% , ratio 76 , FVC 94%   2011 spirometry normal  Review of Systems neg for any significant sore throat, dysphagia, itching, sneezing, nasal congestion or excess/ purulent secretions, fever, chills, sweats, unintended wt loss, pleuritic or exertional cp, hempoptysis, orthopnea pnd or change in chronic leg swelling. Also denies presyncope, palpitations, heartburn, abdominal pain, nausea, vomiting, diarrhea or change in bowel or urinary habits, dysuria,hematuria, rash, arthralgias, visual complaints, headache, numbness weakness or ataxia.     Objective:   Physical Exam  Gen. Pleasant, well-nourished, in no distress ENT - no thrush, no pallor/icterus,no post nasal drip Neck: No JVD, no thyromegaly, no carotid bruits Lungs: no use of accessory muscles, no dullness to percussion, clear without rales or rhonchi  Cardiovascular: Rhythm regular, heart sounds  normal, no murmurs or gallops, no peripheral edema Musculoskeletal: No deformities, no cyanosis or clubbing        Assessment & Plan:

## 2021-08-30 NOTE — Patient Instructions (Signed)
ALbuterol neb x 30 x  2 refills - Ok to take 1/2 vial with hypertonic saline We will try to find pharmacy Flu shot today

## 2021-08-30 NOTE — Assessment & Plan Note (Signed)
There is some evidence of disease progression with slight drop in lung function.  She would like to avoid repeating MAC treatment unless absolutely necessary normal in agreement with this plan. She will likely need second/third line therapy since she has been treated with usual medications multiple times already

## 2021-08-30 NOTE — Assessment & Plan Note (Signed)
Emphasized airway clearance -unfortunately Mucinex causes nausea. We will check with her pharmacy and make sure that hypertonic saline is available. I have asked her to start taking albuterol nebs-this causes tremors so she can take half a vial with her saline nebs , then use her vest

## 2021-08-31 ENCOUNTER — Telehealth: Payer: Self-pay | Admitting: Pulmonary Disease

## 2021-08-31 ENCOUNTER — Other Ambulatory Visit: Payer: Self-pay

## 2021-08-31 MED ORDER — SODIUM CHLORIDE 3 % IN NEBU: INHALATION_SOLUTION | RESPIRATORY_TRACT | 12 refills | Status: DC | PRN

## 2021-08-31 NOTE — Telephone Encounter (Signed)
Sent in Rx to [facility] per pt request.

## 2021-08-31 NOTE — Progress Notes (Signed)
Prolia injection given today, patient tolerated well.

## 2021-08-31 NOTE — Progress Notes (Signed)
Patient ID: Diane Haney, female   DOB: Jul 06, 1946, 75 y.o.   MRN: [id number] Medical screening examination/treatment/procedure(s) were performed by non-physician practitioner and as supervising physician I was immediately available for consultation/collaboration.  I agree with above. Cathlean Cower, MD

## 2021-09-01 NOTE — Telephone Encounter (Signed)
Unfortunately issues regarding nebulizer solutions is specifically something that the Specialty Pharmacy team can NOT provide assistance with. The best I can offer is general information around the subject:  According to the weekly Drug Shortage report for [facility] hospitals, 3% Saline nebs have been moved to the "Watch" list as of 08/31/21. What this means is that shortage of this medication SEEMS to be resolving at this time. Having said that, rising availabilities in hospitals may not have any affect on retail availability. Also, given recent world events (I.e. [place]) I have my doubts that the medication will become readily available any time soon, as evidenced by the last five years.   For now the patient may just have to shop around or possibly seek alternative therapies if necessary.

## 2021-09-01 NOTE — Telephone Encounter (Signed)
Any suggestions on how this patient can get her medication?  [facility] [facility] [phone]  Faustino Congress 18 hours ago (2:53 PM)   States they do not have hypertonic saline. States they informed pt and that some pts have found it on [facility]

## 2021-09-01 NOTE — Telephone Encounter (Signed)
Noted. There is also a mychart encounter open about this. Will close this encounter. Please see mychart message for updates.

## 2021-09-09 DIAGNOSIS — Z23 Encounter for immunization: Secondary | ICD-10-CM | POA: Diagnosis not present

## 2021-09-19 MED ORDER — SODIUM CHLORIDE 3 % IN NEBU: INHALATION_SOLUTION | RESPIRATORY_TRACT | 3 refills | Status: DC

## 2021-09-19 NOTE — Addendum Note (Signed)
Addended by: Valerie Salts on: 09/19/2021 10:19 AM   Modules accepted: Orders

## 2021-09-19 NOTE — Telephone Encounter (Signed)
RX, demographics and OV notes have been sent to [facility] for patient.

## 2021-10-04 DIAGNOSIS — Z961 Presence of intraocular lens: Secondary | ICD-10-CM | POA: Diagnosis not present

## 2021-10-04 DIAGNOSIS — H52203 Unspecified astigmatism, bilateral: Secondary | ICD-10-CM | POA: Diagnosis not present

## 2021-10-04 DIAGNOSIS — H5203 Hypermetropia, bilateral: Secondary | ICD-10-CM | POA: Diagnosis not present

## 2021-10-04 DIAGNOSIS — H26491 Other secondary cataract, right eye: Secondary | ICD-10-CM | POA: Diagnosis not present

## 2021-10-04 DIAGNOSIS — H02831 Dermatochalasis of right upper eyelid: Secondary | ICD-10-CM | POA: Diagnosis not present

## 2021-10-04 DIAGNOSIS — H16223 Keratoconjunctivitis sicca, not specified as Sjogren's, bilateral: Secondary | ICD-10-CM | POA: Diagnosis not present

## 2021-10-04 DIAGNOSIS — H43813 Vitreous degeneration, bilateral: Secondary | ICD-10-CM | POA: Diagnosis not present

## 2021-10-04 DIAGNOSIS — H524 Presbyopia: Secondary | ICD-10-CM | POA: Diagnosis not present

## 2021-10-04 DIAGNOSIS — H02834 Dermatochalasis of left upper eyelid: Secondary | ICD-10-CM | POA: Diagnosis not present

## 2021-10-24 ENCOUNTER — Other Ambulatory Visit: Payer: Self-pay | Admitting: Pulmonary Disease

## 2021-11-14 ENCOUNTER — Other Ambulatory Visit: Payer: Self-pay | Admitting: Internal Medicine

## 2021-11-14 NOTE — Telephone Encounter (Signed)
Please refill as per office routine med refill policy (all routine meds to be refilled for 3 mo or monthly (per pt preference) up to one year from last visit, then month to month grace period for 3 mo, then further med refills will have to be denied) ? ?

## 2021-11-29 ENCOUNTER — Ambulatory Visit (INDEPENDENT_AMBULATORY_CARE_PROVIDER_SITE_OTHER): Payer: Medicare Other | Admitting: Primary Care

## 2021-11-29 ENCOUNTER — Other Ambulatory Visit: Payer: Self-pay

## 2021-11-29 ENCOUNTER — Encounter: Payer: Self-pay | Admitting: Primary Care

## 2021-11-29 VITALS — BP 130/80 | HR 77 | Temp 97.8°F | Ht 65.0 in | Wt 112.6 lb

## 2021-11-29 DIAGNOSIS — J471 Bronchiectasis with (acute) exacerbation: Secondary | ICD-10-CM

## 2021-11-29 DIAGNOSIS — J479 Bronchiectasis, uncomplicated: Secondary | ICD-10-CM

## 2021-11-29 MED ORDER — PROMETHAZINE-DM 6.25-15 MG/5ML PO SYRP: 5.0000 mL | ORAL_SOLUTION | Freq: Four times a day (QID) | ORAL | 0 refills | Status: DC | PRN

## 2021-11-29 NOTE — Patient Instructions (Addendum)
Recommendations: - Stop albuterol nebulizer for now  - Increase hypertonic saline nebulizer to three times a daily until better - Continue vest 106min 2-3 times a day for chest congestion  - Refilled promethazine-dm cough syrup take as directed for cough  - I will discuss treatment plan with Dr. Elsworth Soho, likely will start abx such as Augmentin or Doxycycline   Orders: - HRCT re: bronchiectsis   Follow-up: - 3 months with Dr. Elsworth Soho or sooner if needed

## 2021-11-29 NOTE — Assessment & Plan Note (Addendum)
-   Mild-moderate exacerbation of underlying bronchiectasis. Patient reports increased sob and cough since starting albuterol inhaler 2-3 months ago. Recently had two episodes of hemoptysis. Recommend she increase hypertonic saline neb three times a day and stop albuterol nebulizer for the moment. Continue smart vest 2-3 times a day for 15 min. She is unable to take Mucinex or prednisone. Avoiding azithromycin. I will discuss abx regimen with Dr. Elsworth Soho. Checking HRCT.

## 2021-11-29 NOTE — Progress Notes (Signed)
_0  ID: Evelena Asa, female    DOB: 04/09/1946, 75 y.o.   MRN: [id number]  Chief Complaint  Patient presents with   Follow-up    Patient says she's coughing a lot more and she's coughed up blood    Referring provider: Biagio Borg, MD    HPI: 75 year old female, quit in January 1997 (25-pack-year history).  Past medical history significant for HTN, coronary artery calcification, bronchiectasis/MAI, GERD, breast cancer s/p RT, idiopathic LLE DVT, chronic fatigue, fibromyalgia. Patient of Dr. Elsworth Soho, last seen in March 2022.   5/ 2005 Bronchoscopy >> MAI (S- clarithro, eth, RIF 8.0, synergy positive for E/R) and fortuitum (S- cipro/smikacin/tigecycline).She began antibiotics May 2005 and was treated with ETH/Azithro and cipro until March 2007. Was seen  by ID Johnnye Sima) , for recurrent MAI with previous tx of MAI triple therapy rx.  Finished last course (80mo) in Feb 2016 .  Previous [facility] pulmonary encounter:  05/17/2021-Volanda Napoleon NP Patient presents today for 3 month follow-up. She has a non-productive cough, mainly in the afternoon. Associated wheezing with coughing fits. She had pneumonia in March 2022 which was noted on CTA imaging. Patient has persistent MAC in sputum.  Not currently on antibiotics. Following with infectious disease. She has had three rounds of triple therapy treatment. No evidence of worsening bronchiectasis on imaging. Lung function was noted to have dropped in June 2021, she did not tolerate ANORO d/t palpitation so she was given trial Spiriva. She is using salines nebulizer and vest twice a day along with flutter valve. She will take mucinex as needed. She has shortness of breath which is not new. She does reports difficulty taking deep breath. Spiriva is not covered on her insurance. She is unsure if it helped.  08/30/21- Dr. AElsworth Soho PMH -  stage I moderately differentiated invasive ductal carcinoma of the right breast s/p RT.  Idiopathic LLE DVT (Dx at age 75    noted drop in lung function 05/2020, did not tolerate anoro We started spiriva 02/2021 , now on incruse  02/2021 ED visit - Infiltrate was only noted on CT and not on chest x-ray. Failed a Z-Pak and completed course on Augmentin  CXR 05/2021 clear  She has seen ID twice and has discussed going back on MAC therapy.She has wheezing in the afternoons, nonproductive cough.  She is still unable to get hypertonic saline.  On her last visit we prescribed her Spiriva but now she is on Incruse.  She is still unable to get 3% saline solution and is using normal saline.  Mucinex upsets her stomach  11/29/2021-  interim hx  Patient presents today for 3 month follow-up. Hx bronchiectasis/MAI. Cough has been worse the last 2-3 months since she started using albuterol nebulizer. Over the last week she has noticed small amount of hemoptysis on two occurences. She is using hypertonic saline nebulizer twice which she gets through direct [facility]. She is also using smart vest twice daily for 15 mins. She is unable to take mucinex or prednisone. She was told by infectious disease to avoid azithromycin. Denies fever, chills, sweats, sinuses congestion,    Significant tests/ events reviewed 02/16/20 CTA- No pulmonary emboli. Newly seen acute patchy infiltrates in both lungs, more prominent in the right upper lung than the left, consistent with bronchopneumonia. This could be bacterial or viral. No lobar consolidation or acute collapse. Chronic volume loss in both upper lobes. Chronic scarring and bronchiectasis in the right middle lobe and lingula  11/2020 HRCt >> Patchy bilateral bronchiectasis, volume loss, peribronchovascular nodularity and scattered mucoid impaction, mildly progressive from 11/24/2019   11/24/2019-CT chest without contrast-chronic changes of MAC,  stable mild emphysematous changes, areas of bronchiectasis and chronic atelectasis, larger areas of nodularity in the right lung seen on the prior study have  resolved and there are new areas of vague nodularity.   CT chest 08/2019 chronic changes , Several new large nodules are identified predominantly within the right lung measuring up to 1.6 cm. Likely post infectious or inflammatory in etiology   HRCT Chest 08/2018 >> stable compared to 2018   CT 01/2015>Interval slight progression in right apical consolidation with air bronchograms, probably progression of radiation fibrosis. The left apical component is unchanged.   - stable chronic lung disease with bronchiectasis,scarring and peribronchial nodularity      Pulmonary function testing:   05/2020 PFTs  mild restriction with mild diffusion defect/ FEV1 1.81 (77%), ratio 73, DLCO 15.53 (74%) >> drop in FEV1 from 2.16-1.81 and drop in FVC from 2.71-2.48  09/2017  PFT-stable with FEV1 94% , ratio 76 , FVC 94%  2011 spirometry normal    Allergies  Allergen Reactions   Losartan     headache   Adhesive [Tape]     Blisters   Ciprofloxacin     REACTION: tendinitis   Clarithromycin     REACTION: severe reflux   Prednisone     REACTION: irregular heartbeat, not able to sleep    Immunization History  Administered Date(s) Administered   Fluad Quad(high Dose 65+) 08/29/2019, 09/03/2020   Influenza, High Dose Seasonal PF 08/30/2021   Influenza,inj,Quad PF,6+ Mos 08/25/2013, 07/29/2014, 09/04/2018   Influenza-Unspecified 08/09/2016, 08/22/2017, 08/29/2019   PFIZER(Purple Top)SARS-COV-2 Vaccination 01/07/2020, 01/28/2020, 09/10/2020, 03/28/2021   Pneumococcal Conjugate-13 11/14/2013   Pneumococcal Polysaccharide-23 10/11/2006, 11/12/2012   Td 12/12/2003, 09/08/2015   Tdap 09/08/2015    Past Medical History:  Diagnosis Date   Breast cancer ([place]) 11/12/2012   Dx oct 2013 - [place] - Invasive ductal carcinoma, s/o bilat mastectomy with + margin  - also for XRT soon   Bronchiectasis    Chronic fatigue fibromyalgia syndrome 11/12/2012   COPD (chronic obstructive pulmonary  disease) (HCC)    DVT, lower extremity (HCC)    LLE   Fibromyalgia    GERD (gastroesophageal reflux disease)    History of shingles    Hyperlipidemia    Hypertension    Impaired glucose tolerance 11/14/2013   Migraine    Osteopenia    Osteoporosis 10/16/2007   Qualifier: Diagnosis of  By: Jenny Reichmann MD, Hunt Oris    Pulmonary Mycobacterium avium complex (MAC) infection ([place])    Squamous cell skin cancer, multiple sites     Tobacco History: Social History   Tobacco Use  Smoking Status Former   Packs/day: 1.00   Years: 25.00   Pack years: 25.00   Types: Cigarettes   Quit date: 12/11/1985   Years since quitting: 35.9  Smokeless Tobacco Never  Tobacco Comments   pt does not smoke   Counseling given: Not Answered Tobacco comments: pt does not smoke   Outpatient Medications Prior to Visit  Medication Sig Dispense Refill   albuterol (PROVENTIL) (2.5 MG/3ML) 0.083% nebulizer solution USE 1 VIAL VIA NEBULIZER EVERY 6 HOURS AS NEEDED FOR WHEEZING OR SHORTNESS OF BREATH 75 mL 2   amLODipine (NORVASC) 5 MG tablet Take 1 tablet (5 mg total) by mouth daily. 90 tablet 3   aspirin 81 MG EC tablet  Take 81 mg by mouth daily.     atorvastatin (LIPITOR) 20 MG tablet Take 1 tablet (20 mg total) by mouth daily. 90 tablet 3   calcium citrate-vitamin D (CITRACAL+D) 315-200 MG-UNIT per tablet Take 2 tablets by mouth daily.      carisoprodol (SOMA) 350 MG tablet Take 1 tablet (350 mg total) by mouth 2 (two) times daily as needed for muscle spasms. 180 tablet 1   gabapentin (NEURONTIN) 100 MG capsule Take 1 capsule (100 mg total) by mouth 3 (three) times daily. 270 capsule 1   metoprolol tartrate (LOPRESSOR) 25 MG tablet TAKE 1/2 TABLET(12.5 MG) BY MOUTH TWICE DAILY 30 tablet 0   olmesartan-hydrochlorothiazide (BENICAR HCT) 20-12.5 MG tablet Take 0.5 tablets by mouth daily. 45 tablet 3   Respiratory Therapy Supplies (FLUTTER) DEVI Use as directed. 1 each 0   sodium chloride HYPERTONIC 3 % nebulizer solution  Use 1 vial by nebulization twice daily 720 mL 3   temazepam (RESTORIL) 15 MG capsule TAKE 1 OR 2 CAPSULES BY MOUTH EVERY NIGHT AT BEDTIME AS NEEDED FOR SLEEP 180 capsule 1   promethazine-dextromethorphan (PROMETHAZINE-DM) 6.25-15 MG/5ML syrup Take 5 mLs by mouth 4 (four) times daily as needed for cough. 118 mL 0   umeclidinium bromide (INCRUSE ELLIPTA) 62.5 MCG/INH AEPB Inhale 1 puff into the lungs daily. 30 each 5   No facility-administered medications prior to visit.    Review of Systems  Review of Systems  Constitutional: Negative.   HENT: Negative.    Respiratory:  Positive for cough and shortness of breath.   Cardiovascular: Negative.     Physical Exam  BP 130/80 (BP Location: Left Arm, Patient Position: Sitting, Cuff Size: Normal)    Pulse 77    Temp 97.8 F (36.6 C) (Oral)    Ht _0  (1.651 m)    Wt 112 lb 9.6 oz (51.1 kg)    SpO2 95%    BMI 18.74 kg/m  Physical Exam Constitutional:      Appearance: Normal appearance.  HENT:     Head: Normocephalic and atraumatic.     Mouth/Throat:     Mouth: Mucous membranes are moist.  Cardiovascular:     Rate and Rhythm: Normal rate and regular rhythm.  Pulmonary:     Effort: Pulmonary effort is normal. No respiratory distress.     Breath sounds: Wheezing present.     Comments: Insp/exp wheeze to lung bases; scant rhonchi  Musculoskeletal:        General: Normal range of motion.  Skin:    General: Skin is warm and dry.  Neurological:     General: No focal deficit present.     Mental Status: She is alert and oriented to person, place, and time. Mental status is at baseline.  Psychiatric:        Mood and Affect: Mood normal.        Behavior: Behavior normal.        Thought Content: Thought content normal.        Judgment: Judgment normal.     Lab Results:  CBC    Component Value Date/Time   WBC 7.6 05/17/2021 1050   RBC 4.23 05/17/2021 1050   HGB 13.7 05/17/2021 1050   HCT 40.5 05/17/2021 1050   PLT 213.0 05/17/2021  1050   MCV 95.9 05/17/2021 1050   MCH 32.1 02/15/2021 1049   MCHC 33.8 05/17/2021 1050   RDW 14.7 05/17/2021 1050   LYMPHSABS 1.2 05/17/2021 1050   MONOABS 1.1 (  H) 05/17/2021 1050   EOSABS 0.1 05/17/2021 1050   BASOSABS 0.0 05/17/2021 1050    BMET    Component Value Date/Time   NA 140 02/15/2021 1049   K 4.2 02/15/2021 1049   CL 101 02/15/2021 1049   CO2 32 02/15/2021 1049   GLUCOSE 105 (H) 02/15/2021 1049   BUN 14 02/15/2021 1049   CREATININE 0.85 02/15/2021 1049   CALCIUM 10.0 02/15/2021 1049   GFRNONAA >60 02/15/2021 1049   GFRAA 93 09/24/2008 1044    BNP No results found for: BNP  ProBNP No results found for: PROBNP  Imaging: No results found.   Assessment & Plan:   BRONCHIECTASIS - Mild-moderate exacerbation of underlying bronchiectasis. Patient reports increased sob and cough since starting albuterol inhaler 2-3 months ago. Recently had two episodes of hemoptysis. Recommend she increase hypertonic saline neb three times a day and stop albuterol nebulizer for the moment. Continue smart vest 2-3 times a day for 15 min. She is unable to take Mucinex or prednisone. Avoiding azithromycin. I will discuss abx regimen with Dr. Elsworth Soho. Checking HRCT.   Martyn Ehrich, NP 11/29/2021

## 2021-11-30 MED ORDER — AMOXICILLIN-POT CLAVULANATE 875-125 MG PO TABS: 1.0000 | ORAL_TABLET | Freq: Two times a day (BID) | ORAL | 0 refills | Status: DC

## 2021-11-30 NOTE — Addendum Note (Signed)
Addended by: Martyn Ehrich on: 11/30/2021 01:16 PM   Modules accepted: Orders

## 2021-12-15 ENCOUNTER — Ambulatory Visit (HOSPITAL_BASED_OUTPATIENT_CLINIC_OR_DEPARTMENT_OTHER)
Admission: RE | Admit: 2021-12-15 | Discharge: 2021-12-15 | Disposition: A | Discharge status: Home / Self Care | Payer: Medicare Other | Source: Ambulatory Visit | Attending: Primary Care | Admitting: Primary Care

## 2021-12-15 ENCOUNTER — Other Ambulatory Visit: Payer: Self-pay

## 2021-12-15 DIAGNOSIS — I2510 Atherosclerotic heart disease of native coronary artery without angina pectoris: Secondary | ICD-10-CM | POA: Diagnosis not present

## 2021-12-15 DIAGNOSIS — J471 Bronchiectasis with (acute) exacerbation: Secondary | ICD-10-CM | POA: Diagnosis not present

## 2021-12-15 DIAGNOSIS — R918 Other nonspecific abnormal finding of lung field: Secondary | ICD-10-CM | POA: Diagnosis not present

## 2021-12-15 DIAGNOSIS — J479 Bronchiectasis, uncomplicated: Secondary | ICD-10-CM | POA: Diagnosis not present

## 2021-12-15 DIAGNOSIS — I700 Atherosclerosis of aorta: Secondary | ICD-10-CM | POA: Diagnosis not present

## 2021-12-16 ENCOUNTER — Encounter: Payer: Self-pay | Admitting: Internal Medicine

## 2021-12-16 ENCOUNTER — Ambulatory Visit (INDEPENDENT_AMBULATORY_CARE_PROVIDER_SITE_OTHER): Payer: Medicare Other | Admitting: Internal Medicine

## 2021-12-16 VITALS — BP 128/72 | HR 61 | Temp 97.6°F | Ht 65.0 in | Wt 112.6 lb

## 2021-12-16 DIAGNOSIS — E559 Vitamin D deficiency, unspecified: Secondary | ICD-10-CM

## 2021-12-16 DIAGNOSIS — I700 Atherosclerosis of aorta: Secondary | ICD-10-CM | POA: Diagnosis not present

## 2021-12-16 DIAGNOSIS — R7302 Impaired glucose tolerance (oral): Secondary | ICD-10-CM

## 2021-12-16 DIAGNOSIS — E7849 Other hyperlipidemia: Secondary | ICD-10-CM | POA: Diagnosis not present

## 2021-12-16 DIAGNOSIS — E538 Deficiency of other specified B group vitamins: Secondary | ICD-10-CM

## 2021-12-16 DIAGNOSIS — I10 Essential (primary) hypertension: Secondary | ICD-10-CM

## 2021-12-16 LAB — CBC WITH DIFFERENTIAL/PLATELET
Basophils Absolute: 0 10*3/uL (ref 0.0–0.1)
Basophils Relative: 0.5 % (ref 0.0–3.0)
Eosinophils Absolute: 0.1 10*3/uL (ref 0.0–0.7)
Eosinophils Relative: 2 % (ref 0.0–5.0)
HCT: 43.8 % (ref 36.0–46.0)
Hemoglobin: 14.4 g/dL (ref 12.0–15.0)
Lymphocytes Relative: 16.9 % (ref 12.0–46.0)
Lymphs Abs: 1.2 10*3/uL (ref 0.7–4.0)
MCHC: 32.8 g/dL (ref 30.0–36.0)
MCV: 96.9 fl (ref 78.0–100.0)
Monocytes Absolute: 0.8 10*3/uL (ref 0.1–1.0)
Monocytes Relative: 11.5 % (ref 3.0–12.0)
Neutro Abs: 5 10*3/uL (ref 1.4–7.7)
Neutrophils Relative %: 69.1 % (ref 43.0–77.0)
Platelets: 218 10*3/uL (ref 150.0–400.0)
RBC: 4.52 Mil/uL (ref 3.87–5.11)
RDW: 14.5 % (ref 11.5–15.5)
WBC: 7.2 10*3/uL (ref 4.0–10.5)

## 2021-12-16 LAB — BASIC METABOLIC PANEL
BUN: 22 mg/dL (ref 6–23)
CO2: 29 mEq/L (ref 19–32)
Calcium: 9.9 mg/dL (ref 8.4–10.5)
Chloride: 101 mEq/L (ref 96–112)
Creatinine, Ser: 0.89 mg/dL (ref 0.40–1.20)
GFR: 63.06 mL/min (ref >60.00)
Glucose, Bld: 91 mg/dL (ref 70–99)
Potassium: 4.3 mEq/L (ref 3.5–5.1)
Sodium: 139 mEq/L (ref 135–145)

## 2021-12-16 LAB — LIPID PANEL
Cholesterol: 167 mg/dL (ref 0–200)
HDL: 72 mg/dL (ref >39.00)
LDL Cholesterol: 83 mg/dL (ref 0–99)
NonHDL: 95.17
Total CHOL/HDL Ratio: 2
Triglycerides: 60 mg/dL (ref 0.0–149.0)
VLDL: 12 mg/dL (ref 0.0–40.0)

## 2021-12-16 LAB — VITAMIN B12: Vitamin B-12: 367 pg/mL (ref 211–911)

## 2021-12-16 LAB — HEPATIC FUNCTION PANEL
ALT: 19 U/L (ref 0–35)
AST: 24 U/L (ref 0–37)
Albumin: 4.4 g/dL (ref 3.5–5.2)
Alkaline Phosphatase: 38 U/L — ABNORMAL LOW (ref 39–117)
Bilirubin, Direct: 0.1 mg/dL (ref 0.0–0.3)
Total Bilirubin: 0.5 mg/dL (ref 0.2–1.2)
Total Protein: 8 g/dL (ref 6.0–8.3)

## 2021-12-16 LAB — URINALYSIS, ROUTINE W REFLEX MICROSCOPIC
Bilirubin Urine: NEGATIVE
Ketones, ur: NEGATIVE
Leukocytes,Ua: NEGATIVE
Nitrite: NEGATIVE
Specific Gravity, Urine: 1.015 (ref 1.000–1.030)
Total Protein, Urine: NEGATIVE
Urine Glucose: NEGATIVE
Urobilinogen, UA: 0.2 (ref 0.0–1.0)
pH: 6.5 (ref 5.0–8.0)

## 2021-12-16 LAB — HEMOGLOBIN A1C: Hgb A1c MFr Bld: 6.3 % (ref 4.6–6.5)

## 2021-12-16 LAB — TSH: TSH: 5.21 u[IU]/mL (ref 0.35–5.50)

## 2021-12-16 LAB — VITAMIN D 25 HYDROXY (VIT D DEFICIENCY, FRACTURES): VITD: 50.52 ng/mL (ref 30.00–100.00)

## 2021-12-16 MED ORDER — TEMAZEPAM 15 MG PO CAPS: ORAL_CAPSULE | ORAL | 1 refills | Status: DC

## 2021-12-16 MED ORDER — AMLODIPINE BESYLATE 5 MG PO TABS: 5.0000 mg | ORAL_TABLET | Freq: Every day | ORAL | 3 refills | Status: DC

## 2021-12-16 MED ORDER — OLMESARTAN MEDOXOMIL-HCTZ 20-12.5 MG PO TABS: 0.5000 | ORAL_TABLET | Freq: Every day | ORAL | 3 refills | Status: DC

## 2021-12-16 MED ORDER — METOPROLOL TARTRATE 25 MG PO TABS: ORAL_TABLET | ORAL | 3 refills | Status: DC

## 2021-12-16 MED ORDER — GABAPENTIN 100 MG PO CAPS: 100.0000 mg | ORAL_CAPSULE | Freq: Three times a day (TID) | ORAL | 1 refills | Status: DC

## 2021-12-16 MED ORDER — ATORVASTATIN CALCIUM 20 MG PO TABS: 20.0000 mg | ORAL_TABLET | Freq: Every day | ORAL | 3 refills | Status: DC

## 2021-12-16 NOTE — Progress Notes (Signed)
Patient ID: Diane Haney, female   DOB: 08/09/1946, 76 y.o.   MRN: [id number]         Chief Complaint:: yearly exam       HPI:  Diane Haney is a 76 y.o. female here overall doing ok.  Pt denies chest pain, increased sob or doe, wheezing, orthopnea, PND, increased LE swelling, palpitations, dizziness or syncope.   Pt denies polydipsia, polyuria, or new focal neuro s/s.   Pt denies fever, wt loss, night sweats, loss of appetite, or other constitutional symptoms  Needs refills.  No new complaints  pt continues prolia q 6 mo.  Had episode CAP mar 2022.  Cont's to follow with ID regarding MAC.     Wt Readings from Last 3 Encounters:  12/16/21 112 lb 9.6 oz (51.1 kg)  11/29/21 112 lb 9.6 oz (51.1 kg)  08/30/21 112 lb 12.8 oz (51.2 kg)   BP Readings from Last 3 Encounters:  12/16/21 128/72  11/29/21 130/80  08/30/21 (!) 128/56   Immunization History  Administered Date(s) Administered   Fluad Quad(high Dose 65+) 08/29/2019, 09/03/2020   Influenza, High Dose Seasonal PF 08/30/2021   Influenza,inj,Quad PF,6+ Mos 08/25/2013, 07/29/2014, 09/04/2018   Influenza-Unspecified 08/09/2016, 08/22/2017, 08/29/2019   PFIZER(Purple Top)SARS-COV-2 Vaccination 01/07/2020, 01/28/2020, 09/10/2020, 03/28/2021, 09/09/2021   Pneumococcal Conjugate-13 11/14/2013   Pneumococcal Polysaccharide-23 10/11/2006, 11/12/2012   Td 12/12/2003, 09/08/2015   Tdap 09/08/2015   Zoster Recombinat (Shingrix) 12/16/2020, 02/28/2021   There are no preventive care reminders to display for this patient.     Past Medical History:  Diagnosis Date   Breast cancer ([place]) 11/12/2012   Dx oct 2013 - [place] - Invasive ductal carcinoma, s/o bilat mastectomy with + margin  - also for XRT soon   Bronchiectasis    Chronic fatigue fibromyalgia syndrome 11/12/2012   COPD (chronic obstructive pulmonary disease) (HCC)    DVT, lower extremity (HCC)    LLE   Fibromyalgia    GERD (gastroesophageal reflux disease)     History of shingles    Hyperlipidemia    Hypertension    Impaired glucose tolerance 11/14/2013   Migraine    Osteopenia    Osteoporosis 10/16/2007   Qualifier: Diagnosis of  By: Jenny Reichmann MD, Hunt Oris    Pulmonary Mycobacterium avium complex (MAC) infection ([place])    Squamous cell skin cancer, multiple sites    Past Surgical History:  Procedure Laterality Date   FOOT SURGERY     mastectomy bilateral oct 2013     shoulder impingement     VIDEO BRONCHOSCOPY N/A 05/07/2014   Procedure: VIDEO BRONCHOSCOPY WITHOUT FLUORO;  Surgeon: Elsie Stain, MD;  Location: [facility];  Service: Cardiopulmonary;  Laterality: N/A;    reports that she quit smoking about 36 years ago. Her smoking use included cigarettes. She has a 25.00 pack-year smoking history. She has never used smokeless tobacco. She reports that she does not drink alcohol and does not use drugs. family history includes Breast cancer in an other family member; Colon polyps in her father; Heart disease in an other family member; Hyperlipidemia in an other family member; Hypertension in an other family member; Lung cancer in her father; Other in an other family member. Allergies  Allergen Reactions   Losartan     headache   Adhesive [Tape]     Blisters   Ciprofloxacin     REACTION: tendinitis   Clarithromycin     REACTION: severe reflux   Prednisone  REACTION: irregular heartbeat, not able to sleep   Current Outpatient Medications on File Prior to Visit  Medication Sig Dispense Refill   albuterol (PROVENTIL) (2.5 MG/3ML) 0.083% nebulizer solution USE 1 VIAL VIA NEBULIZER EVERY 6 HOURS AS NEEDED FOR WHEEZING OR SHORTNESS OF BREATH 75 mL 2   aspirin 81 MG EC tablet Take 81 mg by mouth daily.     calcium citrate-vitamin D (CITRACAL+D) 315-200 MG-UNIT per tablet Take 2 tablets by mouth daily.      carisoprodol (SOMA) 350 MG tablet Take 1 tablet (350 mg total) by mouth 2 (two) times daily as needed for muscle spasms. 180 tablet 1    Respiratory Therapy Supplies (FLUTTER) DEVI Use as directed. 1 each 0   sodium chloride HYPERTONIC 3 % nebulizer solution Use 1 vial by nebulization twice daily 720 mL 3   promethazine-dextromethorphan (PROMETHAZINE-DM) 6.25-15 MG/5ML syrup Take 5 mLs by mouth 4 (four) times daily as needed for cough. (Patient not taking: Reported on 12/16/2021) 473 mL 0   No current facility-administered medications on file prior to visit.        ROS:  All others reviewed and negative.  Objective        PE:  BP 128/72 (BP Location: Left Arm, Patient Position: Sitting, Cuff Size: Normal)    Pulse 61    Temp 97.6 F (36.4 C) (Oral)    Ht _0  (1.651 m)    Wt 112 lb 9.6 oz (51.1 kg)    SpO2 99%    BMI 18.74 kg/m                 Constitutional: Pt appears in NAD               HENT: Head: NCAT.                Right Ear: External ear normal.                 Left Ear: External ear normal.                Eyes: . Pupils are equal, round, and reactive to light. Conjunctivae and EOM are normal               Nose: without d/c or deformity               Neck: Neck supple. Gross normal ROM               Cardiovascular: Normal rate and regular rhythm.                 Pulmonary/Chest: Effort normal and breath sounds without rales or wheezing.                Abd:  Soft, NT, ND, + BS, no organomegaly               Neurological: Pt is alert. At baseline orientation, motor grossly intact               Skin: Skin is warm. No rashes, no other new lesions, LE edema - none               Psychiatric: Pt behavior is normal without agitation   Micro: none  Cardiac tracings I have personally interpreted today:  none  Pertinent Radiological findings (summarize): none   Lab Results  Component Value Date   WBC 7.2 12/16/2021   HGB 14.4 12/16/2021   HCT 43.8 12/16/2021  PLT 218.0 12/16/2021   GLUCOSE 91 12/16/2021   CHOL 167 12/16/2021   TRIG 60.0 12/16/2021   HDL 72.00 12/16/2021   LDLDIRECT 124.4 11/14/2013   LDLCALC  83 12/16/2021   ALT 19 12/16/2021   AST 24 12/16/2021   NA 139 12/16/2021   K 4.3 12/16/2021   CL 101 12/16/2021   CREATININE 0.89 12/16/2021   BUN 22 12/16/2021   CO2 29 12/16/2021   TSH 5.21 12/16/2021   INR 0.9 02/15/2021   HGBA1C 6.3 12/16/2021   Assessment/Plan:  Diane Haney is a 76 y.o. White or Caucasian [1] female with  has a past medical history of Breast cancer ([place]) (11/12/2012), Bronchiectasis, Chronic fatigue fibromyalgia syndrome (11/12/2012), COPD (chronic obstructive pulmonary disease) ([place]), DVT, lower extremity ([place]), Fibromyalgia, GERD (gastroesophageal reflux disease), History of shingles, Hyperlipidemia, Hypertension, Impaired glucose tolerance (11/14/2013), Migraine, Osteopenia, Osteoporosis (10/16/2007), Pulmonary Mycobacterium avium complex (MAC) infection ([place]), and Squamous cell skin cancer, multiple sites.  Aortic atherosclerosis (HCC) Pt to continue lipitor low chol diet, excercise  Essential hypertension BP Readings from Last 3 Encounters:  12/16/21 128/72  11/29/21 130/80  08/30/21 (!) 128/56   Stable, pt to continue medical treatment norvasc, lopressor, benicar hct   Hyperlipidemia Lab Results  Component Value Date   LDLCALC 83 12/16/2021   Mild uncontrolled, pt to continue current statin lipitor 20 as declines change, for low chol diet   Impaired glucose tolerance Lab Results  Component Value Date   HGBA1C 6.3 12/16/2021   Stable, pt to continue current medical treatment  - diet  Followup: Return in about 1 year (around 12/16/2022).  Cathlean Cower, MD 12/18/2021 1:26 PM [facility]

## 2021-12-16 NOTE — Patient Instructions (Signed)

## 2021-12-16 NOTE — Progress Notes (Signed)
Please let patient know CT chest showed evidence of on-going lung infection, unchanged scarring and bronchiectasis. Please send in course of Augmentin 1 tab twice daily x 14 days. If able to get resp sputum sample and AFB please order and have patient provide prior to starting abx.

## 2021-12-18 ENCOUNTER — Encounter: Payer: Self-pay | Admitting: Internal Medicine

## 2021-12-18 NOTE — Assessment & Plan Note (Signed)
Pt to continue lipitor low chol diet, excercise

## 2021-12-18 NOTE — Assessment & Plan Note (Signed)
Lab Results  Component Value Date   LDLCALC 83 12/16/2021   Mild uncontrolled, pt to continue current statin lipitor 20 as declines change, for low chol diet

## 2021-12-18 NOTE — Assessment & Plan Note (Signed)
BP Readings from Last 3 Encounters:  12/16/21 128/72  11/29/21 130/80  08/30/21 (!) 128/56   Stable, pt to continue medical treatment norvasc, lopressor, benicar hct

## 2021-12-18 NOTE — Assessment & Plan Note (Signed)
Lab Results  Component Value Date   HGBA1C 6.3 12/16/2021   Stable, pt to continue current medical treatment  - diet

## 2021-12-19 ENCOUNTER — Telehealth: Payer: Self-pay | Admitting: Primary Care

## 2021-12-19 ENCOUNTER — Encounter: Payer: Self-pay | Admitting: Internal Medicine

## 2021-12-19 NOTE — Telephone Encounter (Signed)
I apologize the originally Augmentin was prescribed following her most recent visit for bronchiectasis exacerbation and Dr. Elsworth Soho confirmed he wanted her to take that. Since she has finished antibiotic and CT imaging shows ongoing infection I would recommend she return to see ID. Sorry for the confusion.

## 2021-12-19 NOTE — Telephone Encounter (Signed)
Please advise on mychart.  ?

## 2022-01-11 ENCOUNTER — Ambulatory Visit: Payer: Medicare Other | Admitting: Infectious Disease

## 2022-01-17 ENCOUNTER — Encounter: Payer: Self-pay | Admitting: Internal Medicine

## 2022-01-17 MED ORDER — TEMAZEPAM 15 MG PO CAPS: ORAL_CAPSULE | ORAL | 1 refills | Status: DC

## 2022-01-17 NOTE — Telephone Encounter (Signed)
Please advise. Medication refill. RX needs to be sent to [facility] in [place]

## 2022-01-18 ENCOUNTER — Encounter: Payer: Self-pay | Admitting: Infectious Disease

## 2022-01-18 ENCOUNTER — Other Ambulatory Visit: Payer: Self-pay

## 2022-01-18 ENCOUNTER — Ambulatory Visit (INDEPENDENT_AMBULATORY_CARE_PROVIDER_SITE_OTHER): Payer: Medicare Other | Admitting: Infectious Disease

## 2022-01-18 VITALS — BP 152/74 | HR 57 | Temp 97.8°F | Wt 112.0 lb

## 2022-01-18 DIAGNOSIS — I10 Essential (primary) hypertension: Secondary | ICD-10-CM | POA: Diagnosis not present

## 2022-01-18 DIAGNOSIS — Z23 Encounter for immunization: Secondary | ICD-10-CM | POA: Diagnosis not present

## 2022-01-18 DIAGNOSIS — Z7185 Encounter for immunization safety counseling: Secondary | ICD-10-CM | POA: Diagnosis not present

## 2022-01-18 DIAGNOSIS — A310 Pulmonary mycobacterial infection: Secondary | ICD-10-CM

## 2022-01-18 HISTORY — DX: Encounter for immunization safety counseling: Z71.85

## 2022-01-18 NOTE — Progress Notes (Signed)
Chief complaints: Still with daily cough but not able to produce sputum still with chills occasionally but not every day Subjective:    Patient ID: Diane Haney, female    DOB: 21-Nov-1946, 76 y.o.   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  Previously followed by my partner Dr. Johnnye Sima and treated for both Mycobacterium avium and M. fortuitum.then she had recurrence of symptoms and was positive for Mycobacterium avium again and treated for M avium with azithromycin rifampin and ethambutol.  Was last seen by Dr. Johnnye Sima in 2016.  She seemed to be doing relatively well and is continue to follow with pulmonary and Dr. Kara Mead   Early March 2022 she was having worsening cough and called in lobe our pulmonary.  An AFB sputum culture was prescribed and she was able to get up sputum which she says she normally does not do very well.  Azithromycin was prescribed and she did not start this until after she had produced sputum.  The azithromycin did not help her and ultimately she went to the emergency department where she was seen in CT scan showed an infiltrate.  She was given Augmentin which improved her symptoms.  In the interim the AFB stain and culture came back positive on probe for M avium.  He was not ready to start therapy the last time I saw her and she still is not ready to start today.  We did have discussions about whether would help with her dyspnea and it might but she still did not want to initiate treatment.    Past Medical History:  Diagnosis Date   Breast cancer ([place]) 11/12/2012   Dx oct 2013 - [place] - Invasive ductal carcinoma, s/o bilat mastectomy with + margin  - also for XRT soon   Bronchiectasis    Chronic fatigue fibromyalgia syndrome 11/12/2012   COPD (chronic  obstructive pulmonary disease) (HCC)    DVT, lower extremity (HCC)    LLE   Fibromyalgia    GERD (gastroesophageal reflux disease)    History of shingles    Hyperlipidemia    Hypertension    Impaired glucose tolerance 11/14/2013   Migraine    Osteopenia    Osteoporosis 10/16/2007   Qualifier: Diagnosis of  By: Jenny Reichmann MD, Hunt Oris    Pulmonary Mycobacterium avium complex (MAC) infection ([place])    Squamous cell skin cancer, multiple sites     Past Surgical History:  Procedure Laterality Date   FOOT SURGERY     mastectomy bilateral oct 2013     shoulder impingement     VIDEO BRONCHOSCOPY N/A 05/07/2014   Procedure: VIDEO BRONCHOSCOPY WITHOUT FLUORO;  Surgeon: Elsie Stain, MD;  Location: [facility];  Service: Cardiopulmonary;  Laterality: N/A;    Family History  Problem Relation Age of Onset   Lung cancer Father    Colon polyps Father    Heart disease Other        grandparents   Breast cancer Other    Hyperlipidemia Other    Hypertension Other    Other Other        alcholism / addiction      Social History   Socioeconomic History   Marital status: Married    Spouse name: Not on file   Number of children: 3   Years of education: Not on  file   Highest education level: Not on file  Occupational History   Occupation: previously part-time MD's office  Tobacco Use   Smoking status: Former    Packs/day: 1.00    Years: 25.00    Pack years: 25.00    Types: Cigarettes    Quit date: 12/11/1985    Years since quitting: 36.1   Smokeless tobacco: Never   Tobacco comments:    pt does not smoke  Vaping Use   Vaping Use: Never used  Substance and Sexual Activity   Alcohol use: No    Comment: occasional   Drug use: No   Sexual activity: Not on file  Other Topics Concern   Not on file  Social History Narrative   Has not worked since apprx 1990   Social Determinants of Radio broadcast assistant Strain: Not on file  Food Insecurity: Not on file  Transportation  Needs: Not on file  Physical Activity: Not on file  Stress: Not on file  Social Connections: Not on file    Allergies  Allergen Reactions   Losartan     headache   Adhesive [Tape]     Blisters   Ciprofloxacin     REACTION: tendinitis   Clarithromycin     REACTION: severe reflux   Prednisone     REACTION: irregular heartbeat, not able to sleep     Current Outpatient Medications:    albuterol (PROVENTIL) (2.5 MG/3ML) 0.083% nebulizer solution, USE 1 VIAL VIA NEBULIZER EVERY 6 HOURS AS NEEDED FOR WHEEZING OR SHORTNESS OF BREATH, Disp: 75 mL, Rfl: 2   amLODipine (NORVASC) 5 MG tablet, Take 1 tablet (5 mg total) by mouth daily., Disp: 90 tablet, Rfl: 3   aspirin 81 MG EC tablet, Take 81 mg by mouth daily., Disp: , Rfl:    atorvastatin (LIPITOR) 20 MG tablet, Take 1 tablet (20 mg total) by mouth daily., Disp: 90 tablet, Rfl: 3   calcium citrate-vitamin D (CITRACAL+D) 315-200 MG-UNIT per tablet, Take 2 tablets by mouth daily. , Disp: , Rfl:    carisoprodol (SOMA) 350 MG tablet, Take 1 tablet (350 mg total) by mouth 2 (two) times daily as needed for muscle spasms., Disp: 180 tablet, Rfl: 1   gabapentin (NEURONTIN) 100 MG capsule, Take 1 capsule (100 mg total) by mouth 3 (three) times daily., Disp: 270 capsule, Rfl: 1   metoprolol tartrate (LOPRESSOR) 25 MG tablet, TAKE 1/2 TABLET(12.5 MG) BY MOUTH TWICE DAILY, Disp: 90 tablet, Rfl: 3   olmesartan-hydrochlorothiazide (BENICAR HCT) 20-12.5 MG tablet, Take 0.5 tablets by mouth daily., Disp: 45 tablet, Rfl: 3   promethazine-dextromethorphan (PROMETHAZINE-DM) 6.25-15 MG/5ML syrup, Take 5 mLs by mouth 4 (four) times daily as needed for cough. (Patient not taking: Reported on 12/16/2021), Disp: 473 mL, Rfl: 0   Respiratory Therapy Supplies (FLUTTER) DEVI, Use as directed., Disp: 1 each, Rfl: 0   sodium chloride HYPERTONIC 3 % nebulizer solution, Use 1 vial by nebulization twice daily, Disp: 720 mL, Rfl: 3   temazepam (RESTORIL) 15 MG capsule, TAKE 1  OR 2 CAPSULES BY MOUTH EVERY NIGHT AT BEDTIME AS NEEDED FOR SLEEP, Disp: 180 capsule, Rfl: 1   Review of Systems  Constitutional:  Positive for chills and fatigue. Negative for activity change, appetite change, diaphoresis, fever and unexpected weight change.  HENT:  Negative for congestion, rhinorrhea, sinus pressure, sneezing, sore throat and trouble swallowing.   Eyes:  Negative for photophobia and visual disturbance.  Respiratory:  Positive for cough and shortness of  breath. Negative for chest tightness, wheezing and stridor.   Cardiovascular:  Negative for chest pain, palpitations and leg swelling.  Gastrointestinal:  Negative for abdominal distention, abdominal pain, anal bleeding, blood in stool, constipation, diarrhea, nausea and vomiting.  Genitourinary:  Negative for difficulty urinating, dysuria, flank pain and hematuria.  Musculoskeletal:  Negative for arthralgias, back pain, gait problem, joint swelling and myalgias.  Skin:  Negative for color change, pallor, rash and wound.  Neurological:  Negative for dizziness, tremors, weakness and light-headedness.  Hematological:  Negative for adenopathy. Does not bruise/bleed easily.  Psychiatric/Behavioral:  Negative for agitation, behavioral problems, confusion, decreased concentration, dysphoric mood, self-injury and sleep disturbance. The patient is not nervous/anxious.       Objective:   Physical Exam Constitutional:      General: She is not in acute distress.    Appearance: Normal appearance. She is well-developed. She is not ill-appearing or diaphoretic.  HENT:     Head: Normocephalic and atraumatic.     Right Ear: Hearing and external ear normal.     Left Ear: Hearing and external ear normal.     Nose: No nasal deformity or rhinorrhea.  Eyes:     General: No scleral icterus.    Conjunctiva/sclera: Conjunctivae normal.     Right eye: Right conjunctiva is not injected.     Left eye: Left conjunctiva is not injected.      Pupils: Pupils are equal, round, and reactive to light.  Neck:     Vascular: No JVD.  Cardiovascular:     Rate and Rhythm: Normal rate and regular rhythm.     Heart sounds: Normal heart sounds, S1 normal and S2 normal. No murmur heard.   No friction rub.  Pulmonary:     Effort: Prolonged expiration present. No respiratory distress.     Breath sounds: No stridor. Wheezing present. No rhonchi.  Abdominal:     General: Bowel sounds are normal. There is no distension.     Palpations: Abdomen is soft.     Tenderness: There is no abdominal tenderness.  Musculoskeletal:        General: Normal range of motion.     Right shoulder: Normal.     Left shoulder: Normal.     Cervical back: Normal range of motion and neck supple.     Right hip: Normal.     Left hip: Normal.     Right knee: Normal.     Left knee: Normal.  Lymphadenopathy:     Head:     Right side of head: No submandibular, preauricular or posterior auricular adenopathy.     Left side of head: No submandibular, preauricular or posterior auricular adenopathy.     Cervical: No cervical adenopathy.     Right cervical: No superficial or deep cervical adenopathy.    Left cervical: No superficial or deep cervical adenopathy.  Skin:    General: Skin is warm and dry.     Coloration: Skin is not pale.     Findings: No abrasion, bruising, ecchymosis, erythema, lesion or rash.     Nails: There is no clubbing.  Neurological:     General: No focal deficit present.     Mental Status: She is alert and oriented to person, place, and time.     Sensory: No sensory deficit.     Coordination: Coordination normal.     Gait: Gait normal.  Psychiatric:        Attention and Perception: She is attentive.  Mood and Affect: Mood normal.        Speech: Speech normal.        Behavior: Behavior normal. Behavior is cooperative.        Thought Content: Thought content normal.        Judgment: Judgment normal.          Assessment & Plan:   Mycobacterium avium infection:  She still is not ready to start treatment and she has been treated multiple times over the past decade or more.  She is quite familiar with side effects from medications.  If we did start drugs it would be a 3 times weekly regimen.  Vaccine counseling recommended Prevnar 20 which we gave her today.  I spent 41  minutes with the patient including than 50% of the time in face to face counseling of the patient regarding risk benefit to treatment versus not treating for her mycobacterial infection personally reviewing CT of the lungs performed on January 5th 2023 along with review of medical records in preparation for the visit and during the visit and in coordination of her care.

## 2022-01-31 DIAGNOSIS — L570 Actinic keratosis: Secondary | ICD-10-CM | POA: Diagnosis not present

## 2022-01-31 DIAGNOSIS — Z85828 Personal history of other malignant neoplasm of skin: Secondary | ICD-10-CM | POA: Diagnosis not present

## 2022-01-31 DIAGNOSIS — D0362 Melanoma in situ of left upper limb, including shoulder: Secondary | ICD-10-CM | POA: Diagnosis not present

## 2022-01-31 DIAGNOSIS — L578 Other skin changes due to chronic exposure to nonionizing radiation: Secondary | ICD-10-CM | POA: Diagnosis not present

## 2022-01-31 DIAGNOSIS — C44319 Basal cell carcinoma of skin of other parts of face: Secondary | ICD-10-CM | POA: Diagnosis not present

## 2022-01-31 DIAGNOSIS — D485 Neoplasm of uncertain behavior of skin: Secondary | ICD-10-CM | POA: Diagnosis not present

## 2022-01-31 DIAGNOSIS — L821 Other seborrheic keratosis: Secondary | ICD-10-CM | POA: Diagnosis not present

## 2022-02-07 DIAGNOSIS — I2510 Atherosclerotic heart disease of native coronary artery without angina pectoris: Secondary | ICD-10-CM | POA: Diagnosis not present

## 2022-02-07 DIAGNOSIS — E7800 Pure hypercholesterolemia, unspecified: Secondary | ICD-10-CM | POA: Diagnosis not present

## 2022-02-07 DIAGNOSIS — I10 Essential (primary) hypertension: Secondary | ICD-10-CM | POA: Diagnosis not present

## 2022-02-07 DIAGNOSIS — I471 Supraventricular tachycardia: Secondary | ICD-10-CM | POA: Diagnosis not present

## 2022-02-07 DIAGNOSIS — R0609 Other forms of dyspnea: Secondary | ICD-10-CM | POA: Diagnosis not present

## 2022-02-14 DIAGNOSIS — D0362 Melanoma in situ of left upper limb, including shoulder: Secondary | ICD-10-CM | POA: Diagnosis not present

## 2022-02-28 ENCOUNTER — Other Ambulatory Visit: Payer: Self-pay

## 2022-02-28 ENCOUNTER — Encounter: Payer: Self-pay | Admitting: Pulmonary Disease

## 2022-02-28 ENCOUNTER — Ambulatory Visit (INDEPENDENT_AMBULATORY_CARE_PROVIDER_SITE_OTHER): Payer: Medicare Other | Admitting: Pulmonary Disease

## 2022-02-28 VITALS — BP 118/76 | HR 65 | Temp 98.5°F | Ht 60.0 in | Wt 112.8 lb

## 2022-02-28 DIAGNOSIS — J479 Bronchiectasis, uncomplicated: Secondary | ICD-10-CM

## 2022-02-28 DIAGNOSIS — A310 Pulmonary mycobacterial infection: Secondary | ICD-10-CM

## 2022-02-28 NOTE — Assessment & Plan Note (Signed)
She has increased tree-in-bud nodularity in both lung bases which is new from prior CT and very well may present progressive MAC infection ?We will repeat lung function in 3 months. ?She has been treated for MAC 3 times in the past, ID has recommended azithromycin/rifampin and ethambutol 3 times a week if she were to go back on therapy.  I am not sure whether this regimen would suffice, I offered her a second opinion for MAC.  She would like to wait until there is a clear indication to treat and I am agreeable with this plan ?

## 2022-02-28 NOTE — Progress Notes (Signed)
? ?Subjective:  ? ? Patient ID: Diane Haney, female    DOB: [birth year], 76 y.o.   MRN: [id number] ? ?HPI ? ?76 yo ex-smoker with bronchiectasis and MAI  ?She smoked about 20 pack years before she quit in her early 43s ?Last sputum culture 02/2021 showed MAI ?  ?5/ 2005 Bronchoscopy >> MAI (S- clarithro, eth, RIF 8.0, synergy positive for E/R) and fortuitum (S- cipro/smikacin/tigecycline).She began antibiotics May 2005 and was treated with ETH/Azithro and cipro until March 2007. ?treated for both Mycobacterium avium and M. fortuitum.then she had recurrence of symptoms and was positive for Mycobacterium avium again and treated for M avium with azithromycin rifampin and ethambutol. ?Finished last course (81mo) in Feb 2016 . ?  ?PMH -  stage I moderately differentiated invasive ductal carcinoma of the right breast s/p RT.  ?Idiopathic LLE DVT (Dx at age 76 ?  ? Meds -noted drop in lung function 05/2020, did not tolerate anoro ? spiriva 02/2021 did not help, now on incruse ?  ?Chief Complaint  ?Patient presents with  ? Follow-up  ?  Pt states coughing more. She states she still has SOB. No mucus produyction with the cough   ? ? ?649-monthollow-up visit. ?She saw APP in December and I reviewed ID consultation from February. ?She continues to complain of shortness of breath. ?Minimal sputum production.  She is compliant with smart vest and has finally been able to obtain hypertonic saline again.  Can only do this twice a day due to being expensive ?She is coughing more, complains of occasional chills and feeling cold. ?She has several questions ?-Would going back on antibiotics for MAC help shortness of breath ? ?-Does she need to continue seeing ID? ? ?We reviewed previous CT and PFTs in detail ? ?  ?Significant tests/ events reviewed ?  ? ?12/2021 HRCT >> previous groundglass in the upper lobes has resolved, unchanged bronchiectasis of right apex, right middle lobe and lingula.  Clustered tree-in-bud nodularity in  bilateral lung bases new from prior ? ? ?11/2020 HRCt >> Patchy bilateral bronchiectasis, volume loss, peribronchovascular nodularity and scattered mucoid impaction, mildly progressive from 11/24/2019 ?  ?11/24/2019-CT chest without contrast-chronic changes of MAC,  stable mild emphysematous changes, areas of bronchiectasis and chronic atelectasis, larger areas of nodularity in the right lung seen on the prior study have resolved and there are new areas of vague nodularity. ?  ?CT chest 08/2019 chronic changes , Several new large nodules are identified predominantly within the right lung measuring up to 1.6 cm. Likely post infectious or inflammatory in etiology ?  ?HRCT Chest 08/2018 >> stable compared to 2018 ?  ?CT 01/2015>Interval slight progression in right apical consolidation with air bronchograms, probably progression of radiation fibrosis. The left apical component is unchanged. ?  ?- stable chronic lung disease with bronchiectasis,scarring and peribronchial nodularity  ?  ?  ? 05/2020 PFTs  mild restriction with mild diffusion defect/ FEV1 1.81 (77%), ratio 73, DLCO 15.53 (74%) >> drop in FEV1 from 2.16-1.81 and drop in FVC from 2.71-2.48 ? 09/2017  PFT-stable with FEV1 94% , ratio 76 , FVC 94% ?  ?2011 spirometry normal ? ?Review of Systems ?neg for any significant sore throat, dysphagia, itching, sneezing, nasal congestion or excess/ purulent secretions, fever, chills, sweats, unintended wt loss, pleuritic or exertional cp, hempoptysis, orthopnea pnd or change in chronic leg swelling. Also denies presyncope, palpitations, heartburn, abdominal pain, nausea, vomiting, diarrhea or change in bowel or urinary habits, dysuria,hematuria, rash, arthralgias, visual  complaints, headache, numbness weakness or ataxia. ? ?   ?Objective:  ? Physical Exam ? ?Gen. Pleasant, thin, frail in no distress ?ENT - no thrush, no pallor/icterus,no post nasal drip ?Neck: No JVD, no thyromegaly, no carotid bruits ?Lungs: no use of  accessory muscles, no dullness to percussion, right axillary crackles, no rhonchi ?Cardiovascular: Rhythm regular, heart sounds  normal, no murmurs or gallops, no peripheral edema ?Musculoskeletal: No deformities, no cyanosis or clubbing  ? ? ? ?   ?Assessment & Plan:  ? ? ?

## 2022-02-28 NOTE — Patient Instructions (Addendum)
?  Airway clearance measures ? ?X Spirometry + DLCO  next visit ?

## 2022-02-28 NOTE — Assessment & Plan Note (Signed)
Discussed signs and symptoms of exacerbation. ?Once again emphasized airway clearance and her current regimen of ?-Albuterol nebs ?-Saline neb ?-Smart vest ?-Flutter valve ?

## 2022-03-01 ENCOUNTER — Ambulatory Visit (INDEPENDENT_AMBULATORY_CARE_PROVIDER_SITE_OTHER): Payer: Medicare Other

## 2022-03-01 DIAGNOSIS — M810 Age-related osteoporosis without current pathological fracture: Secondary | ICD-10-CM | POA: Diagnosis not present

## 2022-03-01 MED ORDER — DENOSUMAB 60 MG/ML ~~LOC~~ SOSY
60.0000 mg | PREFILLED_SYRINGE | Freq: Once | SUBCUTANEOUS | Status: AC
  Administered 2022-03-01: 60 mg via SUBCUTANEOUS

## 2022-03-01 NOTE — Progress Notes (Signed)
Pt came in for Prolia injection. Injection was given SQ and pt tolerated well. ?

## 2022-03-02 DIAGNOSIS — C44319 Basal cell carcinoma of skin of other parts of face: Secondary | ICD-10-CM | POA: Diagnosis not present

## 2022-03-09 DIAGNOSIS — L905 Scar conditions and fibrosis of skin: Secondary | ICD-10-CM | POA: Diagnosis not present

## 2022-03-31 ENCOUNTER — Other Ambulatory Visit: Payer: Self-pay | Admitting: Pulmonary Disease

## 2022-05-26 ENCOUNTER — Ambulatory Visit (INDEPENDENT_AMBULATORY_CARE_PROVIDER_SITE_OTHER): Payer: Medicare Other | Admitting: Pulmonary Disease

## 2022-05-26 ENCOUNTER — Encounter: Payer: Self-pay | Admitting: Pulmonary Disease

## 2022-05-26 DIAGNOSIS — J479 Bronchiectasis, uncomplicated: Secondary | ICD-10-CM

## 2022-05-26 DIAGNOSIS — A310 Pulmonary mycobacterial infection: Secondary | ICD-10-CM | POA: Diagnosis not present

## 2022-05-26 LAB — PULMONARY FUNCTION TEST
DL/VA % pred: 96 %
DL/VA: 3.91 ml/min/mmHg/L
DLCO cor % pred: 71 %
DLCO cor: 14.04 ml/min/mmHg
DLCO unc % pred: 71 %
DLCO unc: 14.04 ml/min/mmHg
FEF 25-75 Pre: 0.7 L/sec
FEF2575-%Pred-Pre: 42 %
FEV1-%Pred-Pre: 66 %
FEV1-Pre: 1.43 L
FEV1FVC-%Pred-Pre: 86 %
FEV6-%Pred-Pre: 78 %
FEV6-Pre: 2.16 L
FEV6FVC-%Pred-Pre: 102 %
FVC-%Pred-Pre: 76 %
FVC-Pre: 2.21 L
Pre FEV1/FVC ratio: 65 %
Pre FEV6/FVC Ratio: 97 %

## 2022-05-26 MED ORDER — ANORO ELLIPTA 62.5-25 MCG/ACT IN AEPB
1.0000 | INHALATION_SPRAY | Freq: Every day | RESPIRATORY_TRACT | 0 refills | Status: DC
Start: 2022-05-26 — End: 2022-06-02

## 2022-05-26 NOTE — Assessment & Plan Note (Signed)
Lung function has dropped gradually over the past 2 years from 94% to 66% now. Previously she has not tolerated Anoro but is willing to try again.  A sample was provided and she will call us back for prescription if needed. Meanwhile have asked her to use albuterol more frequently.

## 2022-05-26 NOTE — Patient Instructions (Signed)
Spirometry and DLCO Performed Today.  

## 2022-05-26 NOTE — Progress Notes (Signed)
Spirometry and DLCO Performed Today.  

## 2022-05-26 NOTE — Progress Notes (Signed)
Subjective:    Patient ID: Diane Haney, female    DOB: 10/06/1946, 76 y.o.   MRN: [id number]  HPI  76 yo ex-smoker with bronchiectasis and MAI  She smoked about 20 pack years before she quit in her early 3s Last sputum culture 02/2021 showed MAI  She has been treated for MAC 3 times in the past, ID has recommended azithromycin/rifampin and ethambutol 3 times a week if she were to go back on therapy.   5/ 2005 Bronchoscopy >> MAI (S- clarithro, eth, RIF 8.0, synergy positive for E/R) and fortuitum (S- cipro/smikacin/tigecycline).She began antibiotics May 2005 and was treated with ETH/Azithro and cipro until March 2007. treated for both Mycobacterium avium and M. fortuitum.then she had recurrence of symptoms and was positive for Mycobacterium avium again and treated for M avium with azithromycin rifampin and ethambutol. Finished last course (2mo) in Feb 2016 .   PMH -  stage I moderately differentiated invasive ductal carcinoma of the right breast s/p RT.  Idiopathic LLE DVT (Dx at age 76    Meds -drop in lung function 05/2020, did not tolerate anoro  spiriva 02/2021 did not help, now on incruse  Chief Complaint  Patient presents with   Follow-up    Coughing, SOB, wheezing about the same since LOV.  Pt concerned re increased fatigue. Cannot cough up phlegm.   369-monthollow-up visit. She reports shortness of breath and intermittent dry cough She also reports increased fatigue She has been compliant with her airway clearance measures including hypertonic saline nebs and vest We reviewed PFTs today     Significant tests/ events reviewed  12/2021 HRCT >> previous groundglass in the upper lobes has resolved, unchanged bronchiectasis of right apex, right middle lobe and lingula.  Clustered tree-in-bud nodularity in bilateral lung bases new from prior     11/2020 HRCt >> Patchy bilateral bronchiectasis, volume loss, peribronchovascular nodularity and scattered mucoid impaction,  mildly progressive from 11/24/2019   11/24/2019-CT chest without contrast-chronic changes of MAC,  stable mild emphysematous changes, areas of bronchiectasis and chronic atelectasis, larger areas of nodularity in the right lung seen on the prior study have resolved and there are new areas of vague nodularity.   HRCT Chest 08/2018 >> stable compared to 2018   CT 01/2015>Interval slight progression in right apical consolidation with air bronchograms, probably progression of radiation fibrosis. The left apical component is unchanged.   - stable chronic lung disease with bronchiectasis,scarring and peribronchial nodularity   05/2022 spirometry moderate airway obstruction, ratio 65, FEV1 66%, FVC 76%, DLCO 71%/14.04   05/2020 PFTs  mild restriction with mild diffusion defect/ FEV1 1.81 (77%), ratio 73, DLCO 15.53 (74%) >> drop in FEV1 from 2.16-1.81 and drop in FVC from 2.71-2.48   09/2017  PFT-stable with FEV1 94% , ratio 76 , FVC 94%   2011 spirometry normal   Review of Systems neg for any significant sore throat, dysphagia, itching, sneezing, nasal congestion or excess/ purulent secretions, fever, chills, sweats, unintended wt loss, pleuritic or exertional cp, hempoptysis, orthopnea pnd or change in chronic leg swelling. Also denies presyncope, palpitations, heartburn, abdominal pain, nausea, vomiting, diarrhea or change in bowel or urinary habits, dysuria,hematuria, rash, arthralgias, visual complaints, headache, numbness weakness or ataxia.     Objective:   Physical Exam  Gen. Pleasant, thin woman in no distress ENT - no thrush, no pallor/icterus,no post nasal drip Neck: No JVD, no thyromegaly, no carotid bruits Lungs: no use of accessory muscles, no dullness to percussion, clear  without rales or rhonchi  Cardiovascular: Rhythm regular, heart sounds  normal, no murmurs or gallops, no peripheral edema Musculoskeletal: No deformities, no cyanosis or clubbing        Assessment & Plan:

## 2022-05-26 NOTE — Patient Instructions (Signed)
  X refer to [facility] bronchiectasis clinic for treatment of MAC  X Trial of Anoro or stiolto -sample

## 2022-05-26 NOTE — Assessment & Plan Note (Addendum)
She has been treated 3 times already.  Last treatment was in 2014.  MAI was redemonstrated in sputum in 2022. She has disease progression as demonstrated by drop in lung function although imaging 12/2021 was not significantly changed from prior. I do still feel that she may benefit from therapy. She has been seen by infectious disease but would like a second opinion.  We will refer her to [facility] bronchiectasis clinic.  Unfortunately I do not have sensitivity results from Mycobacterium  isolated in 2022

## 2022-06-02 ENCOUNTER — Encounter: Payer: Self-pay | Admitting: Pulmonary Disease

## 2022-06-02 MED ORDER — ANORO ELLIPTA 62.5-25 MCG/ACT IN AEPB: 1.0000 | INHALATION_SPRAY | Freq: Every day | RESPIRATORY_TRACT | 4 refills | Status: DC

## 2022-06-06 ENCOUNTER — Encounter: Payer: Self-pay | Admitting: Pulmonary Disease

## 2022-06-19 ENCOUNTER — Encounter: Payer: Self-pay | Admitting: Internal Medicine

## 2022-06-19 MED ORDER — GABAPENTIN 100 MG PO CAPS: 100.0000 mg | ORAL_CAPSULE | Freq: Three times a day (TID) | ORAL | 1 refills | Status: DC

## 2022-06-26 ENCOUNTER — Encounter: Payer: Self-pay | Admitting: Internal Medicine

## 2022-06-26 DIAGNOSIS — M5414 Radiculopathy, thoracic region: Secondary | ICD-10-CM | POA: Diagnosis not present

## 2022-06-26 DIAGNOSIS — M5134 Other intervertebral disc degeneration, thoracic region: Secondary | ICD-10-CM | POA: Diagnosis not present

## 2022-06-26 DIAGNOSIS — M5135 Other intervertebral disc degeneration, thoracolumbar region: Secondary | ICD-10-CM | POA: Diagnosis not present

## 2022-06-26 DIAGNOSIS — G8929 Other chronic pain: Secondary | ICD-10-CM | POA: Diagnosis not present

## 2022-06-26 DIAGNOSIS — M62830 Muscle spasm of back: Secondary | ICD-10-CM | POA: Diagnosis not present

## 2022-07-14 DIAGNOSIS — R0602 Shortness of breath: Secondary | ICD-10-CM | POA: Diagnosis not present

## 2022-07-14 DIAGNOSIS — Z87891 Personal history of nicotine dependence: Secondary | ICD-10-CM | POA: Diagnosis not present

## 2022-07-14 DIAGNOSIS — Z681 Body mass index (BMI) 19 or less, adult: Secondary | ICD-10-CM | POA: Diagnosis not present

## 2022-07-14 DIAGNOSIS — A310 Pulmonary mycobacterial infection: Secondary | ICD-10-CM | POA: Diagnosis not present

## 2022-07-14 DIAGNOSIS — J479 Bronchiectasis, uncomplicated: Secondary | ICD-10-CM | POA: Diagnosis not present

## 2022-07-14 DIAGNOSIS — Z86718 Personal history of other venous thrombosis and embolism: Secondary | ICD-10-CM | POA: Diagnosis not present

## 2022-07-14 DIAGNOSIS — Z9013 Acquired absence of bilateral breasts and nipples: Secondary | ICD-10-CM | POA: Diagnosis not present

## 2022-07-14 DIAGNOSIS — R091 Pleurisy: Secondary | ICD-10-CM | POA: Diagnosis not present

## 2022-07-14 DIAGNOSIS — R042 Hemoptysis: Secondary | ICD-10-CM | POA: Diagnosis not present

## 2022-07-14 DIAGNOSIS — Z7982 Long term (current) use of aspirin: Secondary | ICD-10-CM | POA: Diagnosis not present

## 2022-07-14 DIAGNOSIS — R062 Wheezing: Secondary | ICD-10-CM | POA: Diagnosis not present

## 2022-07-18 ENCOUNTER — Ambulatory Visit: Payer: Medicare Other | Admitting: Infectious Disease

## 2022-07-18 DIAGNOSIS — J479 Bronchiectasis, uncomplicated: Secondary | ICD-10-CM | POA: Diagnosis not present

## 2022-07-25 DIAGNOSIS — A310 Pulmonary mycobacterial infection: Secondary | ICD-10-CM | POA: Diagnosis not present

## 2022-07-25 DIAGNOSIS — J479 Bronchiectasis, uncomplicated: Secondary | ICD-10-CM | POA: Diagnosis not present

## 2022-08-02 ENCOUNTER — Encounter: Payer: Self-pay | Admitting: Internal Medicine

## 2022-08-02 ENCOUNTER — Ambulatory Visit (INDEPENDENT_AMBULATORY_CARE_PROVIDER_SITE_OTHER): Payer: Medicare Other | Admitting: Internal Medicine

## 2022-08-02 VITALS — BP 140/80 | HR 82 | Temp 98.2°F | Ht 66.0 in | Wt 113.4 lb

## 2022-08-02 DIAGNOSIS — I10 Essential (primary) hypertension: Secondary | ICD-10-CM

## 2022-08-02 DIAGNOSIS — R7302 Impaired glucose tolerance (oral): Secondary | ICD-10-CM

## 2022-08-02 DIAGNOSIS — M545 Low back pain, unspecified: Secondary | ICD-10-CM

## 2022-08-02 MED ORDER — CARISOPRODOL 350 MG PO TABS: 350.0000 mg | ORAL_TABLET | Freq: Two times a day (BID) | ORAL | 1 refills | Status: DC | PRN

## 2022-08-02 NOTE — Progress Notes (Addendum)
Patient ID: [name], female   DOB: February 23, 1946, 76 y.o.   MRN: [id number]        Chief Complaint: follow up lo back pain       HPI:  Diane Haney is a 76 y.o. female here with c/o acute onset right > left lower back pain x 2 wks after coughing due to lung disease, pain just not getting better, constant, mod to severe at times, worse to stand up or bend.  Better to rest.  Pt denies chest pain, increased sob or doe, wheezing, orthopnea, PND, increased LE swelling, palpitations, dizziness or syncope.   Pt denies polydipsia, polyuria, or new focal neuro s/s.         Wt Readings from Last 3 Encounters:  08/02/22 113 lb 6 oz (51.4 kg)  05/26/22 112 lb 9.6 oz (51.1 kg)  02/28/22 112 lb 12.8 oz (51.2 kg)   BP Readings from Last 3 Encounters:  08/02/22 (!) 140/80  05/26/22 118/72  02/28/22 118/76         Past Medical History:  Diagnosis Date   Breast cancer ([place]) 11/12/2012   Dx oct 2013 - [facility] - Invasive ductal carcinoma, s/o bilat mastectomy with + margin  - also for XRT soon   Bronchiectasis    Chronic fatigue fibromyalgia syndrome 11/12/2012   COPD (chronic obstructive pulmonary disease) (HCC)    DVT, lower extremity (HCC)    LLE   Fibromyalgia    GERD (gastroesophageal reflux disease)    History of shingles    Hyperlipidemia    Hypertension    Impaired glucose tolerance 11/14/2013   Migraine    Osteopenia    Osteoporosis 10/16/2007   Qualifier: Diagnosis of  By: Diane Reichmann MD, Hunt Oris    Pulmonary Mycobacterium avium complex (MAC) infection ([place])    Squamous cell skin cancer, multiple sites    Vaccine counseling 01/18/2022   Past Surgical History:  Procedure Laterality Date   FOOT SURGERY     mastectomy bilateral oct 2013     shoulder impingement     VIDEO BRONCHOSCOPY N/A 05/07/2014   Procedure: VIDEO BRONCHOSCOPY WITHOUT FLUORO;  Surgeon: Elsie Stain, MD;  Location: [facility];  Service: Cardiopulmonary;  Laterality: N/A;    reports that she quit  smoking about 36 years ago. Her smoking use included cigarettes. She has a 25.00 pack-year smoking history. She has never used smokeless tobacco. She reports that she does not drink alcohol and does not use drugs. family history includes Breast cancer in an other family member; Colon polyps in her father; Heart disease in an other family member; Hyperlipidemia in an other family member; Hypertension in an other family member; Lung cancer in her father; Other in an other family member. Allergies  Allergen Reactions   Meloxicam Anaphylaxis    Increased blood pressure    Losartan     headache   Adhesive [Tape]     Blisters   Ciprofloxacin     REACTION: tendinitis   Clarithromycin     REACTION: severe reflux   Prednisone     REACTION: irregular heartbeat, not able to sleep   Current Outpatient Medications on File Prior to Visit  Medication Sig Dispense Refill   albuterol (PROVENTIL) (2.5 MG/3ML) 0.083% nebulizer solution USE 1 VIAL VIA NEBULIZER EVERY 6 HOURS AS NEEDED FOR WHEEZING OR SHORTNESS OF BREATH 75 mL 2   amLODipine (NORVASC) 5 MG tablet Take 1 tablet (5 mg total) by mouth daily. [place]  tablet 3   aspirin 81 MG EC tablet Take 81 mg by mouth daily.     atorvastatin (LIPITOR) 20 MG tablet Take 1 tablet (20 mg total) by mouth daily. 90 tablet 3   calcium citrate-vitamin D (CITRACAL+D) 315-200 MG-UNIT per tablet Take 2 tablets by mouth daily.      gabapentin (NEURONTIN) 100 MG capsule Take 1 capsule (100 mg total) by mouth 3 (three) times daily. 270 capsule 1   metoprolol tartrate (LOPRESSOR) 25 MG tablet TAKE 1/2 TABLET(12.5 MG) BY MOUTH TWICE DAILY 90 tablet 3   olmesartan-hydrochlorothiazide (BENICAR HCT) 20-12.5 MG tablet Take 0.5 tablets by mouth daily. 45 tablet 3   promethazine-dextromethorphan (PROMETHAZINE-DM) 6.25-15 MG/5ML syrup Take 5 mLs by mouth 4 (four) times daily as needed for cough. 473 mL 0   Respiratory Therapy Supplies (FLUTTER) DEVI Use as directed. 1 each 0   Sodium  Chloride, Inhalant, 7 % NEBU Inhale into the lungs.     temazepam (RESTORIL) 15 MG capsule TAKE 1 OR 2 CAPSULES BY MOUTH EVERY NIGHT AT BEDTIME AS NEEDED FOR SLEEP 180 capsule 1   umeclidinium-vilanterol (ANORO ELLIPTA) 62.5-25 MCG/ACT AEPB Inhale 1 puff into the lungs daily. 1 each 4   sodium chloride HYPERTONIC 3 % nebulizer solution Use 1 vial by nebulization twice daily (Patient not taking: Reported on 08/02/2022) 720 mL 3   No current facility-administered medications on file prior to visit.        ROS:  All others reviewed and negative.  Objective        PE:  BP (!) 140/80 (BP Location: Left Arm, Patient Position: Sitting, Cuff Size: Small)   Pulse 82   Temp 98.2 F (36.8 C) (Oral)   Ht _0  (1.676 m)   Wt 113 lb 6 oz (51.4 kg)   SpO2 93%   BMI 18.30 kg/m                 Constitutional: Pt appears in NAD               HENT: Head: NCAT.                Right Ear: External ear normal.                 Left Ear: External ear normal.                Eyes: . Pupils are equal, round, and reactive to light. Conjunctivae and EOM are normal               Nose: without d/c or deformity               Neck: Neck supple. Gross normal ROM               Cardiovascular: Normal rate and regular rhythm.                 Pulmonary/Chest: Effort normal and breath sounds without rales or wheezing.                Abd:  Soft, NT, ND, + BS, no organomegaly               Spine nontender in midline, but has marked msk tender spasm bilateral               Neurological: Pt is alert. At baseline orientation, motor grossly intact               Skin:  Skin is warm. No rashes, no other new lesions, LE edema - none               Psychiatric: Pt behavior is normal without agitation   Micro: none  Cardiac tracings I have personally interpreted today:  none  Pertinent Radiological findings (summarize): none   Lab Results  Component Value Date   WBC 7.2 12/16/2021   HGB 14.4 12/16/2021   HCT 43.8  12/16/2021   PLT 218.0 12/16/2021   GLUCOSE 91 12/16/2021   CHOL 167 12/16/2021   TRIG 60.0 12/16/2021   HDL 72.00 12/16/2021   LDLDIRECT 124.4 11/14/2013   LDLCALC 83 12/16/2021   ALT 19 12/16/2021   AST 24 12/16/2021   NA 139 12/16/2021   K 4.3 12/16/2021   CL 101 12/16/2021   CREATININE 0.89 12/16/2021   BUN 22 12/16/2021   CO2 29 12/16/2021   TSH 5.21 12/16/2021   INR 0.9 02/15/2021   HGBA1C 6.3 12/16/2021   Assessment/Plan:  Diane Haney is a 76 y.o. White or Caucasian [1] female with  has a past medical history of Breast cancer ([place]) (11/12/2012), Bronchiectasis, Chronic fatigue fibromyalgia syndrome (11/12/2012), COPD (chronic obstructive pulmonary disease) ([place]), DVT, lower extremity ([place]), Fibromyalgia, GERD (gastroesophageal reflux disease), History of shingles, Hyperlipidemia, Hypertension, Impaired glucose tolerance (11/14/2013), Migraine, Osteopenia, Osteoporosis (10/16/2007), Pulmonary Mycobacterium avium complex (MAC) infection ([place]), Squamous cell skin cancer, multiple sites, and Vaccine counseling (01/18/2022).  Low back pain C/w msk spasm, for soma qid prn,  to f/u any worsening symptoms or concerns   Essential hypertension BP Readings from Last 3 Encounters:  08/02/22 (!) 140/80  05/26/22 118/72  02/28/22 118/76   Stable, pt to continue medical treatment norvasc 5 qd, lopressor 12.5 bid, benicar hct 40-12.5 qd   Impaired glucose tolerance Lab Results  Component Value Date   HGBA1C 6.3 12/16/2021   Stable, pt to continue current medical treatment  - diet, wt control, excercise  Followup: Return if symptoms worsen or fail to improve.  Cathlean Cower, MD 08/04/2022 8:48 PM [facility]

## 2022-08-03 DIAGNOSIS — J479 Bronchiectasis, uncomplicated: Secondary | ICD-10-CM | POA: Diagnosis not present

## 2022-08-03 DIAGNOSIS — A310 Pulmonary mycobacterial infection: Secondary | ICD-10-CM | POA: Diagnosis not present

## 2022-08-04 ENCOUNTER — Encounter: Payer: Self-pay | Admitting: Internal Medicine

## 2022-08-04 NOTE — Assessment & Plan Note (Signed)
Lab Results  Component Value Date   HGBA1C 6.3 12/16/2021   Stable, pt to continue current medical treatment  - diet, wt control, excercise

## 2022-08-04 NOTE — Patient Instructions (Signed)
Please take all new medication as prescribed - the muscle relaxer as needed

## 2022-08-04 NOTE — Assessment & Plan Note (Signed)
C/w msk spasm, for soma qid prn,  to f/u any worsening symptoms or concerns

## 2022-08-04 NOTE — Assessment & Plan Note (Signed)
BP Readings from Last 3 Encounters:  08/02/22 (!) 140/80  05/26/22 118/72  02/28/22 118/76   Stable, pt to continue medical treatment norvasc 5 qd, lopressor 12.5 bid, benicar hct 40-12.5 qd

## 2022-08-08 DIAGNOSIS — L578 Other skin changes due to chronic exposure to nonionizing radiation: Secondary | ICD-10-CM | POA: Diagnosis not present

## 2022-08-08 DIAGNOSIS — X32XXXS Exposure to sunlight, sequela: Secondary | ICD-10-CM | POA: Diagnosis not present

## 2022-08-08 DIAGNOSIS — Z85828 Personal history of other malignant neoplasm of skin: Secondary | ICD-10-CM | POA: Diagnosis not present

## 2022-08-08 DIAGNOSIS — Z85820 Personal history of malignant melanoma of skin: Secondary | ICD-10-CM | POA: Diagnosis not present

## 2022-08-08 DIAGNOSIS — D1801 Hemangioma of skin and subcutaneous tissue: Secondary | ICD-10-CM | POA: Diagnosis not present

## 2022-08-08 DIAGNOSIS — L814 Other melanin hyperpigmentation: Secondary | ICD-10-CM | POA: Diagnosis not present

## 2022-08-08 DIAGNOSIS — L821 Other seborrheic keratosis: Secondary | ICD-10-CM | POA: Diagnosis not present

## 2022-08-21 ENCOUNTER — Other Ambulatory Visit: Payer: Self-pay | Admitting: Pulmonary Disease

## 2022-08-23 DIAGNOSIS — D72821 Monocytosis (symptomatic): Secondary | ICD-10-CM | POA: Diagnosis not present

## 2022-08-23 DIAGNOSIS — Z853 Personal history of malignant neoplasm of breast: Secondary | ICD-10-CM | POA: Diagnosis not present

## 2022-08-23 DIAGNOSIS — Z08 Encounter for follow-up examination after completed treatment for malignant neoplasm: Secondary | ICD-10-CM | POA: Diagnosis not present

## 2022-09-06 ENCOUNTER — Ambulatory Visit (INDEPENDENT_AMBULATORY_CARE_PROVIDER_SITE_OTHER): Payer: Medicare Other

## 2022-09-06 DIAGNOSIS — M810 Age-related osteoporosis without current pathological fracture: Secondary | ICD-10-CM | POA: Diagnosis not present

## 2022-09-06 MED ORDER — DENOSUMAB 60 MG/ML ~~LOC~~ SOSY
60.0000 mg | PREFILLED_SYRINGE | Freq: Once | SUBCUTANEOUS | Status: AC
  Administered 2022-09-06: 60 mg via SUBCUTANEOUS

## 2022-09-06 NOTE — Progress Notes (Signed)
After obtaining consent, and per orders of Dr. John, injection of Prolia given by Alizabeth Antonio P Yarieliz Wasser. Patient instructed to report any adverse reaction to me immediately.  

## 2022-10-03 ENCOUNTER — Encounter: Payer: Self-pay | Admitting: Pulmonary Disease

## 2022-10-03 ENCOUNTER — Ambulatory Visit (INDEPENDENT_AMBULATORY_CARE_PROVIDER_SITE_OTHER): Payer: Medicare Other | Admitting: Pulmonary Disease

## 2022-10-03 VITALS — BP 118/70 | HR 62 | Temp 97.9°F | Ht 65.5 in | Wt 115.0 lb

## 2022-10-03 DIAGNOSIS — Z23 Encounter for immunization: Secondary | ICD-10-CM

## 2022-10-03 DIAGNOSIS — A310 Pulmonary mycobacterial infection: Secondary | ICD-10-CM

## 2022-10-03 DIAGNOSIS — J479 Bronchiectasis, uncomplicated: Secondary | ICD-10-CM

## 2022-10-03 NOTE — Progress Notes (Signed)
Subjective:    Patient ID: Diane Haney, female    DOB: 10/09/1946, 76 y.o.   MRN: [id number]  HPI  76 yo ex-smoker with bronchiectasis and MAI  She smoked about 20 pack years before she quit in her early 14s She has been treated 3 times for MAC.  Last treatment was  2014 -2016.  MAI was redemonstrated in sputum in 2022.   She has been treated for MAC 3 times in the past, ID has recommended azithromycin/rifampin and ethambutol 3 times a week if she were to go back on therapy.   5/ 2005 Bronchoscopy >> MAI (S- clarithro, eth, RIF 8.0, synergy positive for E/R) and fortuitum (S- cipro/smikacin/tigecycline).She began antibiotics May 2005 and was treated with ETH/Azithro and cipro until March 2007. treated for both Mycobacterium avium and M. fortuitum.then she had recurrence of symptoms and was positive for Mycobacterium avium again and treated for M avium with azithromycin rifampin and ethambutol. Finished last course (42mo) in Feb 2016 .   PMH -  stage I moderately differentiated invasive ductal carcinoma of the right breast s/p RT.  Idiopathic LLE DVT (Dx at age 76    Meds -drop in lung function 05/2020, did not tolerate anoro  spiriva 02/2021 did not help, now on incruse  [facility] For airway clearance, use percussive vest, Aerobika and Aeroeclipse, and Hypertonic Saline 7% . Increased concentration of hypertonic saline to 7% Repeat sputum testing   [facility]  She continues to report coughing.  Anoro helps with the breathing but she feels like this makes her cough. She is compliant with airway clearance measures including breast and hypertonic saline nebs    Significant tests/ events reviewed  12/2021 HRCT >> previous groundglass in the upper lobes has resolved, unchanged bronchiectasis of right apex, right middle lobe and lingula.  Clustered tree-in-bud nodularity in bilateral lung bases new from prior      11/2020 HRCt >> Patchy bilateral bronchiectasis, volume loss, peribronchovascular nodularity and scattered mucoid impaction, mildly progressive from 11/24/2019   11/24/2019-CT chest without contrast-chronic changes of MAC,  stable mild emphysematous changes, areas of bronchiectasis and chronic atelectasis, larger areas of nodularity in the right lung seen on the prior study have resolved and there are new areas of vague nodularity.   HRCT Chest 08/2018 >> stable compared to 2018   CT 01/2015>Interval slight progression in right apical consolidation with air bronchograms, probably progression of radiation fibrosis. The left apical component is unchanged.   - stable chronic lung disease with bronchiectasis,scarring and peribronchial nodularity    05/2022 spirometry moderate airway obstruction, ratio 65, FEV1 66%, FVC 76%, DLCO 71%/14.04    05/2020 PFTs  mild restriction with mild diffusion defect/ FEV1 1.81 (77%), ratio 73, DLCO 15.53 (74%) >> drop in FEV1 from 2.16-1.81 and drop in FVC from 2.71-2.48    09/2017  PFT-stable with FEV1 94% , ratio 76 , FVC 94%   2011 spirometry normal  Review of Systems neg for any significant sore throat, dysphagia, itching, sneezing, nasal congestion or excess/ purulent secretions, fever, chills, sweats, unintended wt loss, pleuritic or exertional cp, hempoptysis, orthopnea pnd or change in chronic leg swelling. Also denies presyncope, palpitations, heartburn, abdominal pain, nausea, vomiting, diarrhea or change in bowel or urinary habits, dysuria,hematuria, rash, arthralgias, visual complaints, headache, numbness weakness or ataxia.     Objective:   Physical Exam  Gen. Pleasant, thin woman in no distress ENT - no thrush,  no pallor/icterus,no post nasal drip Neck: No JVD, no thyromegaly, no carotid bruits Lungs: no use of accessory muscles, no dullness to percussion, rt base rales no rhonchi  Cardiovascular: Rhythm regular, heart sounds  normal, no murmurs  or gallops, no peripheral edema Musculoskeletal: No deformities, no cyanosis or clubbing        Assessment & Plan:

## 2022-10-03 NOTE — Assessment & Plan Note (Signed)
We discussed treatment options and she would likely need triple therapy again Flu shot today. Advised COVID and RSV vaccination

## 2022-10-03 NOTE — Assessment & Plan Note (Signed)
We will proceed with bronchoscopy and get BAL for AFB We will proceed with high-resolution CT chest to look for interval progression of disease  -She will continue with airway clearance measures including vest, hypertonic saline nebs, Mucinex and flutter valve  -She will continue with Anoro

## 2022-10-03 NOTE — Patient Instructions (Signed)
X bronchoscopy scheduled for   Get back on Anoro  X schedule HRCT chest to FU on b-ectasis  X flu shot today RSV/ covid booster recommended

## 2022-10-03 NOTE — H&P (View-Only) (Signed)
Subjective:    Patient ID: Diane Haney, female    DOB: May 06, 1946, 76 y.o.   MRN: [id number]  HPI  76 yo ex-smoker with bronchiectasis and MAI  She smoked about 20 pack years before she quit in her early 31s She has been treated 3 times for MAC.  Last treatment was  2014 -2016.  MAI was redemonstrated in sputum in 2022.   She has been treated for MAC 3 times in the past, ID has recommended azithromycin/rifampin and ethambutol 3 times a week if she were to go back on therapy.   5/ 2005 Bronchoscopy >> MAI (S- clarithro, eth, RIF 8.0, synergy positive for E/R) and fortuitum (S- cipro/smikacin/tigecycline).She began antibiotics May 2005 and was treated with ETH/Azithro and cipro until March 2007. treated for both Mycobacterium avium and M. fortuitum.then she had recurrence of symptoms and was positive for Mycobacterium avium again and treated for M avium with azithromycin rifampin and ethambutol. Finished last course (28mo) in Feb 2016 .   PMH -  stage I moderately differentiated invasive ductal carcinoma of the right breast s/p RT.  Idiopathic LLE DVT (Dx at age 76    Meds -drop in lung function 05/2020, did not tolerate anoro  spiriva 02/2021 did not help, now on incruse  [facility] >> For airway clearance, use percussive vest, Aerobika and Aeroeclipse, and Hypertonic Saline 7% . Increased concentration of hypertonic saline to 7% Repeat sputum testing   [facility]  She continues to report coughing.  Anoro helps with the breathing but she feels like this makes her cough. She is compliant with airway clearance measures including breast and hypertonic saline nebs    Significant tests/ events reviewed  12/2021 HRCT >> previous groundglass in the upper lobes has resolved, unchanged bronchiectasis of right apex, right middle lobe and lingula.  Clustered tree-in-bud nodularity in bilateral lung bases new from prior      11/2020 HRCt >> Patchy bilateral bronchiectasis, volume loss, peribronchovascular nodularity and scattered mucoid impaction, mildly progressive from 11/24/2019   11/24/2019-CT chest without contrast-chronic changes of MAC,  stable mild emphysematous changes, areas of bronchiectasis and chronic atelectasis, larger areas of nodularity in the right lung seen on the prior study have resolved and there are new areas of vague nodularity.   HRCT Chest 08/2018 >> stable compared to 2018   CT 01/2015>Interval slight progression in right apical consolidation with air bronchograms, probably progression of radiation fibrosis. The left apical component is unchanged.   - stable chronic lung disease with bronchiectasis,scarring and peribronchial nodularity    05/2022 spirometry moderate airway obstruction, ratio 65, FEV1 66%, FVC 76%, DLCO 71%/14.04    05/2020 PFTs  mild restriction with mild diffusion defect/ FEV1 1.81 (77%), ratio 73, DLCO 15.53 (74%) >> drop in FEV1 from 2.16-1.81 and drop in FVC from 2.71-2.48    09/2017  PFT-stable with FEV1 94% , ratio 76 , FVC 94%   2011 spirometry normal  Review of Systems neg for any significant sore throat, dysphagia, itching, sneezing, nasal congestion or excess/ purulent secretions, fever, chills, sweats, unintended wt loss, pleuritic or exertional cp, hempoptysis, orthopnea pnd or change in chronic leg swelling. Also denies presyncope, palpitations, heartburn, abdominal pain, nausea, vomiting, diarrhea or change in bowel or urinary habits, dysuria,hematuria, rash, arthralgias, visual complaints, headache, numbness weakness or ataxia.     Objective:   Physical Exam  Gen. Pleasant, thin woman in no distress ENT - no thrush,  no pallor/icterus,no post nasal drip Neck: No JVD, no thyromegaly, no carotid bruits Lungs: no use of accessory muscles, no dullness to percussion, rt base rales no rhonchi  Cardiovascular: Rhythm regular, heart sounds  normal, no murmurs  or gallops, no peripheral edema Musculoskeletal: No deformities, no cyanosis or clubbing        Assessment & Plan:

## 2022-10-05 ENCOUNTER — Other Ambulatory Visit: Payer: Self-pay | Admitting: Primary Care

## 2022-10-06 NOTE — Telephone Encounter (Signed)
Beth, please advise on med refill.

## 2022-10-07 DIAGNOSIS — Z23 Encounter for immunization: Secondary | ICD-10-CM | POA: Diagnosis not present

## 2022-10-09 NOTE — Telephone Encounter (Signed)
Spoke with pharmacy [facility] on [address] in [place] and they states pt's promethazine Dm  would be ready after 11:30 am for pick up. Notified pt. Nothing further needed at this time.

## 2022-10-10 ENCOUNTER — Encounter (HOSPITAL_BASED_OUTPATIENT_CLINIC_OR_DEPARTMENT_OTHER): Payer: Self-pay | Admitting: Pulmonary Disease

## 2022-10-11 ENCOUNTER — Encounter (HOSPITAL_COMMUNITY): Payer: Self-pay | Admitting: Pulmonary Disease

## 2022-10-11 ENCOUNTER — Ambulatory Visit (HOSPITAL_BASED_OUTPATIENT_CLINIC_OR_DEPARTMENT_OTHER)
Admission: RE | Admit: 2022-10-11 | Discharge: 2022-10-11 | Disposition: A | Discharge status: Home / Self Care | Payer: Medicare Other | Source: Ambulatory Visit | Attending: Pulmonary Disease | Admitting: Pulmonary Disease

## 2022-10-11 ENCOUNTER — Other Ambulatory Visit (HOSPITAL_BASED_OUTPATIENT_CLINIC_OR_DEPARTMENT_OTHER): Payer: Medicare Other

## 2022-10-11 DIAGNOSIS — J479 Bronchiectasis, uncomplicated: Secondary | ICD-10-CM | POA: Diagnosis not present

## 2022-10-11 DIAGNOSIS — R918 Other nonspecific abnormal finding of lung field: Secondary | ICD-10-CM | POA: Diagnosis not present

## 2022-10-11 DIAGNOSIS — I700 Atherosclerosis of aorta: Secondary | ICD-10-CM | POA: Diagnosis not present

## 2022-10-11 DIAGNOSIS — Z853 Personal history of malignant neoplasm of breast: Secondary | ICD-10-CM | POA: Diagnosis not present

## 2022-10-11 NOTE — Progress Notes (Signed)
Attempted to obtain medical history via telephone, unable to reach at this time. HIPAA compliant voicemail message left requesting return call to pre surgical testing department. 

## 2022-10-13 ENCOUNTER — Other Ambulatory Visit: Payer: Self-pay

## 2022-10-13 ENCOUNTER — Other Ambulatory Visit: Payer: Medicare Other

## 2022-10-13 ENCOUNTER — Other Ambulatory Visit: Payer: Self-pay | Admitting: *Deleted

## 2022-10-13 DIAGNOSIS — J479 Bronchiectasis, uncomplicated: Secondary | ICD-10-CM

## 2022-10-13 DIAGNOSIS — Z01818 Encounter for other preprocedural examination: Secondary | ICD-10-CM | POA: Diagnosis not present

## 2022-10-13 NOTE — Telephone Encounter (Signed)
I have sent the schedulers at [facility] a message to call pt to schedule.  Their phone # is [phone] if the pt would like to call to reschedule.  I will route back to triage so pt can be made aware thru Agency.

## 2022-10-15 LAB — NOVEL CORONAVIRUS, NAA: SARS-CoV-2, NAA: NOT DETECTED

## 2022-10-15 LAB — SPECIMEN STATUS REPORT

## 2022-10-16 ENCOUNTER — Telehealth: Payer: Self-pay | Admitting: Pulmonary Disease

## 2022-10-16 NOTE — Telephone Encounter (Signed)
Called and spoke to patient and gave her CT results. Nothing further needed.

## 2022-10-17 ENCOUNTER — Ambulatory Visit (HOSPITAL_COMMUNITY)
Admission: RE | Admit: 2022-10-17 | Discharge: 2022-10-17 | Disposition: A | Discharge status: Home / Self Care | Payer: Medicare Other | Attending: Pulmonary Disease | Admitting: Pulmonary Disease

## 2022-10-17 ENCOUNTER — Encounter (HOSPITAL_COMMUNITY)
Admission: RE | Disposition: A | Discharge status: Home / Self Care | Payer: Self-pay | Source: Home / Self Care | Attending: Pulmonary Disease

## 2022-10-17 ENCOUNTER — Other Ambulatory Visit: Payer: Self-pay

## 2022-10-17 DIAGNOSIS — Z853 Personal history of malignant neoplasm of breast: Secondary | ICD-10-CM | POA: Diagnosis not present

## 2022-10-17 DIAGNOSIS — J479 Bronchiectasis, uncomplicated: Secondary | ICD-10-CM | POA: Insufficient documentation

## 2022-10-17 DIAGNOSIS — Z8619 Personal history of other infectious and parasitic diseases: Secondary | ICD-10-CM | POA: Insufficient documentation

## 2022-10-17 DIAGNOSIS — Z86718 Personal history of other venous thrombosis and embolism: Secondary | ICD-10-CM | POA: Insufficient documentation

## 2022-10-17 DIAGNOSIS — B49 Unspecified mycosis: Secondary | ICD-10-CM | POA: Diagnosis not present

## 2022-10-17 HISTORY — PX: VIDEO BRONCHOSCOPY: SHX5072

## 2022-10-17 HISTORY — PX: BRONCHIAL WASHINGS: SHX5105

## 2022-10-17 SURGERY — VIDEO BRONCHOSCOPY WITHOUT FLUORO
Anesthesia: Moderate Sedation

## 2022-10-17 MED ORDER — MIDAZOLAM HCL (PF) 5 MG/ML IJ SOLN
INTRAMUSCULAR | Status: DC | PRN
  Administered 2022-10-17 (×5): 1 mg via INTRAVENOUS

## 2022-10-17 MED ORDER — PHENYLEPHRINE HCL 1 % NA SOLN
NASAL | Status: AC
  Filled 2022-10-17: qty 15

## 2022-10-17 MED ORDER — BUTAMBEN-TETRACAINE-BENZOCAINE 2-2-14 % EX AERO: 1.0000 | INHALATION_SPRAY | Freq: Once | CUTANEOUS | Status: DC

## 2022-10-17 MED ORDER — MIDAZOLAM HCL (PF) 5 MG/ML IJ SOLN
INTRAMUSCULAR | Status: AC
  Filled 2022-10-17: qty 2

## 2022-10-17 MED ORDER — SODIUM CHLORIDE 0.9 % IV SOLN: INTRAVENOUS | Status: DC

## 2022-10-17 MED ORDER — FENTANYL CITRATE (PF) 100 MCG/2ML IJ SOLN
INTRAMUSCULAR | Status: AC
  Filled 2022-10-17: qty 2

## 2022-10-17 MED ORDER — LIDOCAINE HCL 1 % IJ SOLN
INTRAMUSCULAR | Status: AC
  Filled 2022-10-17: qty 20

## 2022-10-17 MED ORDER — LIDOCAINE HCL (PF) 4 % IJ SOLN
INTRAMUSCULAR | Status: AC
  Filled 2022-10-17: qty 5

## 2022-10-17 MED ORDER — LIDOCAINE HCL URETHRAL/MUCOSAL 2 % EX GEL
CUTANEOUS | Status: AC
  Filled 2022-10-17: qty 30

## 2022-10-17 MED ORDER — LIDOCAINE HCL (PF) 4 % IJ SOLN
4.0000 mL | Freq: Once | INTRAMUSCULAR | Status: AC
  Administered 2022-10-17: 4 mL

## 2022-10-17 MED ORDER — FENTANYL CITRATE (PF) 100 MCG/2ML IJ SOLN
INTRAMUSCULAR | Status: AC
  Filled 2022-10-17: qty 4

## 2022-10-17 MED ORDER — FENTANYL CITRATE (PF) 100 MCG/2ML IJ SOLN
INTRAMUSCULAR | Status: DC | PRN
  Administered 2022-10-17 (×5): 50 ug via INTRAVENOUS

## 2022-10-17 MED ORDER — LIDOCAINE HCL URETHRAL/MUCOSAL 2 % EX GEL: 1.0000 | Freq: Once | CUTANEOUS | Status: DC

## 2022-10-17 MED ORDER — PHENYLEPHRINE HCL 0.25 % NA SOLN: 1.0000 | Freq: Four times a day (QID) | NASAL | Status: DC | PRN

## 2022-10-17 MED ORDER — LIDOCAINE HCL URETHRAL/MUCOSAL 2 % EX GEL
CUTANEOUS | Status: DC | PRN
  Administered 2022-10-17: 1

## 2022-10-17 NOTE — Interval H&P Note (Signed)
History and Physical Interval Note:  10/17/2022 3:03 PM  Diane Haney  has presented today for surgery, with the diagnosis of bronchiectasis.  The various methods of treatment have been discussed with the patient and family. After consideration of risks, benefits and other options for treatment, the patient has consented to  Procedure(s): VIDEO BRONCHOSCOPY WITHOUT FLUORO (N/A) as a surgical intervention.  The patient's history has been reviewed, patient examined, no change in status, stable for surgery.  I have reviewed the patient's chart and labs.  Questions were answered to the patient's satisfaction.     Leanna Sato Elsworth Soho

## 2022-10-17 NOTE — Op Note (Signed)
Indication : Bronchiectasis in this 76 year old never smoker with a history of MAC infection  Written informed consent was obtained prior to the procedure. The risks of the procedure including coughing, bleeding and the small chance of lung puncture requiring chest tube were discussed in great detail. The benefits & alternatives including serial follow up were also discussed.  Timeout performed  5 mg versed & 250  mcg fentnayl used in divided doses during the procedure Total time required for moderate sedation was 25 minutes  Bronchoscope entered from the mouth Upper airway nml Vocal cords showed nml appearance & motion. Trachea & bronchial tree examined to the subsegmental level. Minimal amount of white secretions were noted. No endobronchial lesions seen. BAL was  obtained from the RLL.  There was moderate coughing  during the procedure.    Specimen sent: BAL for culture, AFB, fungal and cytology  Diane Haney V.  [phone]

## 2022-10-18 ENCOUNTER — Encounter (HOSPITAL_COMMUNITY): Payer: Self-pay | Admitting: Pulmonary Disease

## 2022-10-18 LAB — BODY FLUID CELL COUNT WITH DIFFERENTIAL
Eos, Fluid: 7 %
Lymphs, Fluid: 1 %
Monocyte-Macrophage-Serous Fluid: 7 % — ABNORMAL LOW (ref 50–90)
Neutrophil Count, Fluid: 85 % — ABNORMAL HIGH (ref 0–25)
Total Nucleated Cell Count, Fluid: 3220 cu mm — ABNORMAL HIGH (ref 0–1000)

## 2022-10-19 LAB — ACID FAST SMEAR (AFB, MYCOBACTERIA): Acid Fast Smear: NEGATIVE

## 2022-10-20 LAB — CULTURE, RESPIRATORY W GRAM STAIN: Culture: NORMAL

## 2022-10-20 LAB — CYTOLOGY - NON PAP

## 2022-11-07 DIAGNOSIS — H02834 Dermatochalasis of left upper eyelid: Secondary | ICD-10-CM | POA: Diagnosis not present

## 2022-11-07 DIAGNOSIS — H524 Presbyopia: Secondary | ICD-10-CM | POA: Diagnosis not present

## 2022-11-07 DIAGNOSIS — H02831 Dermatochalasis of right upper eyelid: Secondary | ICD-10-CM | POA: Diagnosis not present

## 2022-11-07 DIAGNOSIS — H35371 Puckering of macula, right eye: Secondary | ICD-10-CM | POA: Diagnosis not present

## 2022-11-07 DIAGNOSIS — Z961 Presence of intraocular lens: Secondary | ICD-10-CM | POA: Diagnosis not present

## 2022-11-07 DIAGNOSIS — H04123 Dry eye syndrome of bilateral lacrimal glands: Secondary | ICD-10-CM | POA: Diagnosis not present

## 2022-11-07 DIAGNOSIS — H26491 Other secondary cataract, right eye: Secondary | ICD-10-CM | POA: Diagnosis not present

## 2022-11-07 DIAGNOSIS — H43813 Vitreous degeneration, bilateral: Secondary | ICD-10-CM | POA: Diagnosis not present

## 2022-11-09 ENCOUNTER — Encounter (HOSPITAL_BASED_OUTPATIENT_CLINIC_OR_DEPARTMENT_OTHER): Payer: Self-pay | Admitting: Pulmonary Disease

## 2022-11-10 ENCOUNTER — Other Ambulatory Visit: Payer: Medicare Other

## 2022-11-14 NOTE — Telephone Encounter (Signed)
Rigoberto Noel, MD  You54 minutes ago (1:04 PM)    The labs generally run susceptibility, can we check with the lab and have them run this please?

## 2022-11-14 NOTE — Telephone Encounter (Signed)
Mychart message sent by pt: Diane Haney  P [facility] Clinical Pool (supporting Rigoberto Noel, MD)5 days ago    If the Lab still has the isolate for MAC would it be possible to have susceptibility testing done on it? Azithromycin and Amikacin. Dr. Mallory Shirk mentioned also clarithromycin but I wasn't able to tolerate that.   [name]    Dr. Elsworth Soho, please advise.

## 2022-11-15 LAB — MAC SUSCEPTIBILITY BROTH
Ciprofloxacin: >8
Clarithromycin: 1
Doxycycline: >8
Minocycline: >8
Rifabutin: 0.5
Rifampin: 4
Streptomycin: >32

## 2022-11-15 LAB — ACID FAST CULTURE WITH REFLEXED SENSITIVITIES (MYCOBACTERIA): Acid Fast Culture: POSITIVE — AB

## 2022-11-15 LAB — AFB ORGANISM ID BY DNA PROBE
M avium complex: POSITIVE — AB
M tuberculosis complex: NEGATIVE

## 2022-11-16 ENCOUNTER — Ambulatory Visit (INDEPENDENT_AMBULATORY_CARE_PROVIDER_SITE_OTHER): Payer: Medicare Other | Admitting: Pulmonary Disease

## 2022-11-16 ENCOUNTER — Encounter (HOSPITAL_BASED_OUTPATIENT_CLINIC_OR_DEPARTMENT_OTHER): Payer: Self-pay | Admitting: Pulmonary Disease

## 2022-11-16 VITALS — BP 124/66 | HR 64 | Temp 97.9°F | Ht 65.5 in | Wt 114.2 lb

## 2022-11-16 DIAGNOSIS — J479 Bronchiectasis, uncomplicated: Secondary | ICD-10-CM | POA: Diagnosis not present

## 2022-11-16 DIAGNOSIS — A310 Pulmonary mycobacterial infection: Secondary | ICD-10-CM

## 2022-11-16 NOTE — Progress Notes (Signed)
Subjective:    Patient ID: Diane Haney, female    DOB: [birth year], 76 y.o.   MRN: [id number]  HPI  76 yo ex-smoker with bronchiectasis and MAI  She smoked about 20 pack years before she quit in her early 40s She has been treated 3 times for MAC.  Last treatment was  2014 -2016.  MAI was redemonstrated in sputum in 2022.   She has been treated for MAC 3 times in the past, ID has recommended azithromycin/rifampin and ethambutol 3 times a week if she were to go back on therapy.   5/ 2005 Bronchoscopy >> MAI (S- clarithro, eth, RIF 8.0, synergy positive for E/R) and fortuitum (S- cipro/smikacin/tigecycline).She began antibiotics May 2005 and was treated with ETH/Azithro and cipro until March 2007. treated for both Mycobacterium avium and M. fortuitum.then she had recurrence of symptoms and was positive for Mycobacterium avium again and treated for M avium with azithromycin rifampin and ethambutol. Finished last course (84mo) in Feb 2016 .   PMH -  stage I moderately differentiated invasive ductal carcinoma of the right breast s/p RT.  Idiopathic LLE DVT (Dx at age 76    Meds -drop in lung function 05/2020, did not tolerate anoro  spiriva 02/2021 did not help, now on incruse  Chief Complaint  Patient presents with   Follow-up    Pt states no new issues since LOV.   We discussed bronchoscopy results. BAL showed MAC, sensitivity reviewed. Fungal culture was positive for pseudohyphae but nothing grew out She continues to use her vest, saline nebs.  Anoro was causing increased coughing and muscle spasms so she has stopped using this. She uses half vial of albuterol daily Reviewed chest CT results  Significant tests/ events reviewed   10/2022 HRCT chest >> stable bronchiectasis and tree-in-bud opacities 12/2021 HRCT >> previous groundglass in the upper lobes has resolved, unchanged bronchiectasis of right apex, right middle lobe and lingula.  Clustered tree-in-bud nodularity in  bilateral lung bases new from prior     11/2020 HRCt >> Patchy bilateral bronchiectasis, volume loss, peribronchovascular nodularity and scattered mucoid impaction, mildly progressive from 11/24/2019   11/24/2019-CT chest without contrast-chronic changes of MAC,  stable mild emphysematous changes, areas of bronchiectasis and chronic atelectasis, larger areas of nodularity in the right lung seen on the prior study have resolved and there are new areas of vague nodularity.   HRCT Chest 08/2018 >> stable compared to 2018   CT 01/2015>Interval slight progression in right apical consolidation with air bronchograms, probably progression of radiation fibrosis. The left apical component is unchanged.   - stable chronic lung disease with bronchiectasis,scarring and peribronchial nodularity    05/2022 spirometry moderate airway obstruction, ratio 65, FEV1 66%, FVC 76%, DLCO 71%/14.04    05/2020 PFTs  mild restriction with mild diffusion defect/ FEV1 1.81 (77%), ratio 73, DLCO 15.53 (74%) >> drop in FEV1 from 2.16-1.81 and drop in FVC from 2.71-2.48    09/2017  PFT-stable with FEV1 94% , ratio 76 , FVC 94%   2011 spirometry normal  Review of Systems neg for any significant sore throat, dysphagia, itching, sneezing, nasal congestion or excess/ purulent secretions, fever, chills, sweats, unintended wt loss, pleuritic or exertional cp, hempoptysis, orthopnea pnd or change in chronic leg swelling. Also denies presyncope, palpitations, heartburn, abdominal pain, nausea, vomiting, diarrhea or change in bowel or urinary habits, dysuria,hematuria, rash, arthralgias, visual complaints, headache, numbness weakness or ataxia.     Objective:   Physical Exam  Gen. Pleasant,  thin woman, in no distress ENT - no thrush, no pallor/icterus,no post nasal drip Neck: No JVD, no thyromegaly, no carotid bruits Lungs: no use of accessory muscles, no dullness to percussion, clear without rales or rhonchi  Cardiovascular:  Rhythm regular, heart sounds  normal, no murmurs or gallops, no peripheral edema Musculoskeletal: No deformities, no cyanosis or clubbing        Assessment & Plan:

## 2022-11-16 NOTE — Assessment & Plan Note (Signed)
She will contact Mycobacterium clinic at [facility] and get started on MAC therapy in January. We can help her with monitoring of side effects/LFTs She will need ophthalmology and hearing check

## 2022-11-16 NOTE — Assessment & Plan Note (Signed)
She will continue airway clearance regimen with saline nebs, vest, half vial of albuterol daily Okay to discontinue Anoro. Hopefully this regimen will keep her out of trouble and delay worsening of lung function. Reassuring that bronchiectasis is no worse compared to a year ago on HRCT

## 2022-11-16 NOTE — Patient Instructions (Addendum)
Okay to discontinue Anoro. Good luck with treatment for MAC We can help with monitoring and blood work during therapy

## 2022-11-20 LAB — FUNGUS CULTURE WITH STAIN

## 2022-11-20 LAB — FUNGAL ORGANISM REFLEX

## 2022-11-20 LAB — FUNGUS CULTURE RESULT

## 2022-12-05 ENCOUNTER — Other Ambulatory Visit: Payer: Self-pay | Admitting: Internal Medicine

## 2022-12-06 ENCOUNTER — Other Ambulatory Visit: Payer: Self-pay

## 2022-12-06 ENCOUNTER — Encounter: Payer: Self-pay | Admitting: Internal Medicine

## 2022-12-06 MED ORDER — ATORVASTATIN CALCIUM 20 MG PO TABS: 20.0000 mg | ORAL_TABLET | Freq: Every day | ORAL | 3 refills | Status: DC

## 2022-12-06 MED ORDER — METOPROLOL TARTRATE 25 MG PO TABS: ORAL_TABLET | ORAL | 3 refills | Status: DC

## 2022-12-06 MED ORDER — AMLODIPINE BESYLATE 5 MG PO TABS: 5.0000 mg | ORAL_TABLET | Freq: Every day | ORAL | 3 refills | Status: DC

## 2022-12-19 ENCOUNTER — Ambulatory Visit: Payer: Medicare Other | Admitting: Internal Medicine

## 2022-12-19 DIAGNOSIS — Z86718 Personal history of other venous thrombosis and embolism: Secondary | ICD-10-CM | POA: Diagnosis not present

## 2022-12-19 DIAGNOSIS — Z881 Allergy status to other antibiotic agents status: Secondary | ICD-10-CM | POA: Diagnosis not present

## 2022-12-19 DIAGNOSIS — Z79899 Other long term (current) drug therapy: Secondary | ICD-10-CM | POA: Diagnosis not present

## 2022-12-19 DIAGNOSIS — J479 Bronchiectasis, uncomplicated: Secondary | ICD-10-CM | POA: Diagnosis not present

## 2022-12-19 DIAGNOSIS — Z87891 Personal history of nicotine dependence: Secondary | ICD-10-CM | POA: Diagnosis not present

## 2022-12-19 DIAGNOSIS — Z7982 Long term (current) use of aspirin: Secondary | ICD-10-CM | POA: Diagnosis not present

## 2022-12-19 DIAGNOSIS — Z9013 Acquired absence of bilateral breasts and nipples: Secondary | ICD-10-CM | POA: Diagnosis not present

## 2022-12-21 ENCOUNTER — Ambulatory Visit (INDEPENDENT_AMBULATORY_CARE_PROVIDER_SITE_OTHER): Payer: Medicare Other | Admitting: Internal Medicine

## 2022-12-21 VITALS — BP 120/62 | HR 57 | Temp 97.9°F | Ht 65.5 in | Wt 113.0 lb

## 2022-12-21 DIAGNOSIS — E559 Vitamin D deficiency, unspecified: Secondary | ICD-10-CM | POA: Diagnosis not present

## 2022-12-21 DIAGNOSIS — R7302 Impaired glucose tolerance (oral): Secondary | ICD-10-CM | POA: Diagnosis not present

## 2022-12-21 DIAGNOSIS — F5101 Primary insomnia: Secondary | ICD-10-CM | POA: Diagnosis not present

## 2022-12-21 DIAGNOSIS — M79641 Pain in right hand: Secondary | ICD-10-CM | POA: Diagnosis not present

## 2022-12-21 DIAGNOSIS — E538 Deficiency of other specified B group vitamins: Secondary | ICD-10-CM | POA: Diagnosis not present

## 2022-12-21 DIAGNOSIS — E7849 Other hyperlipidemia: Secondary | ICD-10-CM | POA: Diagnosis not present

## 2022-12-21 DIAGNOSIS — E2839 Other primary ovarian failure: Secondary | ICD-10-CM | POA: Diagnosis not present

## 2022-12-21 DIAGNOSIS — R7309 Other abnormal glucose: Secondary | ICD-10-CM | POA: Diagnosis not present

## 2022-12-21 DIAGNOSIS — G8929 Other chronic pain: Secondary | ICD-10-CM | POA: Diagnosis not present

## 2022-12-21 DIAGNOSIS — R079 Chest pain, unspecified: Secondary | ICD-10-CM

## 2022-12-21 DIAGNOSIS — I10 Essential (primary) hypertension: Secondary | ICD-10-CM | POA: Diagnosis not present

## 2022-12-21 LAB — LIPID PANEL
Cholesterol: 153 mg/dL (ref 0–200)
HDL: 65.7 mg/dL (ref >39.00)
LDL Cholesterol: 75 mg/dL (ref 0–99)
NonHDL: 87.21
Total CHOL/HDL Ratio: 2
Triglycerides: 62 mg/dL (ref 0.0–149.0)
VLDL: 12.4 mg/dL (ref 0.0–40.0)

## 2022-12-21 LAB — CBC WITH DIFFERENTIAL/PLATELET
Basophils Absolute: 0 10*3/uL (ref 0.0–0.1)
Basophils Relative: 0.6 % (ref 0.0–3.0)
Eosinophils Absolute: 0.1 10*3/uL (ref 0.0–0.7)
Eosinophils Relative: 1.4 % (ref 0.0–5.0)
HCT: 43.5 % (ref 36.0–46.0)
Hemoglobin: 14.8 g/dL (ref 12.0–15.0)
Lymphocytes Relative: 14.9 % (ref 12.0–46.0)
Lymphs Abs: 1.1 10*3/uL (ref 0.7–4.0)
MCHC: 34.1 g/dL (ref 30.0–36.0)
MCV: 96.2 fl (ref 78.0–100.0)
Monocytes Absolute: 0.9 10*3/uL (ref 0.1–1.0)
Monocytes Relative: 12.6 % — ABNORMAL HIGH (ref 3.0–12.0)
Neutro Abs: 5.3 10*3/uL (ref 1.4–7.7)
Neutrophils Relative %: 70.5 % (ref 43.0–77.0)
Platelets: 242 10*3/uL (ref 150.0–400.0)
RBC: 4.52 Mil/uL (ref 3.87–5.11)
RDW: 14 % (ref 11.5–15.5)
WBC: 7.5 10*3/uL (ref 4.0–10.5)

## 2022-12-21 LAB — BASIC METABOLIC PANEL
BUN: 15 mg/dL (ref 6–23)
CO2: 30 mEq/L (ref 19–32)
Calcium: 10.2 mg/dL (ref 8.4–10.5)
Chloride: 101 mEq/L (ref 96–112)
Creatinine, Ser: 0.86 mg/dL (ref 0.40–1.20)
GFR: 65.25 mL/min (ref >60.00)
Glucose, Bld: 93 mg/dL (ref 70–99)
Potassium: 3.6 mEq/L (ref 3.5–5.1)
Sodium: 140 mEq/L (ref 135–145)

## 2022-12-21 LAB — TSH: TSH: 4.88 u[IU]/mL (ref 0.35–5.50)

## 2022-12-21 LAB — URINALYSIS, ROUTINE W REFLEX MICROSCOPIC
Bilirubin Urine: NEGATIVE
Ketones, ur: NEGATIVE
Leukocytes,Ua: NEGATIVE
Nitrite: NEGATIVE
Specific Gravity, Urine: 1.015 (ref 1.000–1.030)
Total Protein, Urine: NEGATIVE
Urine Glucose: NEGATIVE
Urobilinogen, UA: 0.2 (ref 0.0–1.0)
pH: 7 (ref 5.0–8.0)

## 2022-12-21 LAB — HEPATIC FUNCTION PANEL
ALT: 15 U/L (ref 0–35)
AST: 23 U/L (ref 0–37)
Albumin: 4.3 g/dL (ref 3.5–5.2)
Alkaline Phosphatase: 45 U/L (ref 39–117)
Bilirubin, Direct: 0.1 mg/dL (ref 0.0–0.3)
Total Bilirubin: 0.5 mg/dL (ref 0.2–1.2)
Total Protein: 7.9 g/dL (ref 6.0–8.3)

## 2022-12-21 LAB — VITAMIN B12: Vitamin B-12: 424 pg/mL (ref 211–911)

## 2022-12-21 LAB — VITAMIN D 25 HYDROXY (VIT D DEFICIENCY, FRACTURES): VITD: 48.23 ng/mL (ref 30.00–100.00)

## 2022-12-21 LAB — HEMOGLOBIN A1C: Hgb A1c MFr Bld: 6.3 % (ref 4.6–6.5)

## 2022-12-21 MED ORDER — TEMAZEPAM 15 MG PO CAPS: 15.0000 mg | ORAL_CAPSULE | Freq: Every day | ORAL | 1 refills | Status: DC

## 2022-12-21 MED ORDER — GABAPENTIN 100 MG PO CAPS: 100.0000 mg | ORAL_CAPSULE | Freq: Three times a day (TID) | ORAL | 1 refills | Status: DC

## 2022-12-21 NOTE — Assessment & Plan Note (Signed)
Also for A1c with labs

## 2022-12-21 NOTE — Assessment & Plan Note (Signed)
BP Readings from Last 3 Encounters:  12/21/22 120/62  11/16/22 124/66  10/17/22 (!) 116/57   Stable, pt to continue medical treatment norvasc 5 qd, benicar hct 20 - 12.5 qd, lopressor 12.5 bid

## 2022-12-21 NOTE — Assessment & Plan Note (Signed)
Overall stable, but needs gabapentin refill

## 2022-12-21 NOTE — Assessment & Plan Note (Signed)
Lab Results  Component Value Date   LDLCALC 83 12/16/2021   Stable, pt to continue current statin lipitor 20 mg qd

## 2022-12-21 NOTE — Assessment & Plan Note (Signed)
C/w possible CTS and trigger finger - for hand surgury referral

## 2022-12-21 NOTE — Patient Instructions (Signed)
Please schedule the bone density test before leaving today at the scheduling desk (where you check out)  Please continue all other medications as before, and refills have been done for the temazepam and gabapentin  Please have the pharmacy call with any other refills you may need.  Please continue your efforts at being more active, low cholesterol diet, and weight control.  You are otherwise up to date with prevention measures today.  Please keep your appointments with your specialists as you may have planned  You will be contacted regarding the referral for: hand surgury  Please go to the LAB at the blood drawing area for the tests to be done  You will be contacted by phone if any changes need to be made immediately.  Otherwise, you will receive a letter about your results with an explanation, but please check with MyChart first.  Please remember to sign up for MyChart if you have not done so, as this will be important to you in the future with finding out test results, communicating by private email, and scheduling acute appointments online when needed.  Please make an Appointment to return in 6 months, or sooner if needed

## 2022-12-21 NOTE — Progress Notes (Signed)
Patient ID: Diane Haney, female   DOB: [birth year], 77 y.o.   MRN: [id number]         Chief Complaint:: yearly exam and insomnia, hyperglycemia, htn, hld       HPI:  Diane Haney is a 77 y.o. female here overall doing ok.  Pt denies chest pain, increased sob or doe, wheezing, orthopnea, PND, increased LE swelling, palpitations, dizziness or syncope.  Has ongoing bronchiectasis, but has not required recent tx for MAC.  Needs gabapentin refill for chronic post mastectomy paiin.  Has ongoing sleep onset difficutly, temazepam does well but will need PA to approve.  Also c/o right hand pain - tips of fingers painful and ring finger, and numbness of little finger right hand. Ring finger also mild early trigger like.  Due for DXA follow up exam.   Pt denies polydipsia, polyuria, or new focal neuro s/s.    Pt denies fever, wt loss, night sweats, loss of appetite, or other constitutional symptoms      Wt Readings from Last 3 Encounters:  12/21/22 113 lb (51.3 kg)  11/16/22 114 lb 3.2 oz (51.8 kg)  10/17/22 112 lb (50.8 kg)   BP Readings from Last 3 Encounters:  12/21/22 120/62  11/16/22 124/66  10/17/22 (!) 116/57   Immunization History  Administered Date(s) Administered   Fluad Quad(high Dose 65+) 08/29/2019, 09/03/2020, 10/03/2022   Influenza, High Dose Seasonal PF 08/30/2021   Influenza,inj,Quad PF,6+ Mos 08/25/2013, 07/29/2014, 09/04/2018   Influenza-Unspecified 08/09/2016, 08/22/2017, 08/29/2019   PFIZER Comirnaty(Gray Top)Covid-19 Tri-Sucrose Vaccine 03/28/2021   PFIZER(Purple Top)SARS-COV-2 Vaccination 01/07/2020, 01/28/2020, 09/10/2020, 03/28/2021, 09/09/2021   PNEUMOCOCCAL CONJUGATE-20 01/18/2022   Pfizer Covid-19 Vaccine Bivalent Booster 72yr & up 09/09/2021   Pneumococcal Conjugate-13 11/14/2013   Pneumococcal Polysaccharide-23 10/11/2006, 11/12/2012   Pneumococcal-Unspecified 08/04/2015   Respiratory Syncytial Virus Vaccine,Recomb Aduvanted(Arexvy) 10/21/2022   Td 12/12/2003,  09/08/2015   Tdap 09/08/2015   Zoster Recombinat (Shingrix) 12/16/2020, 02/28/2021   Zoster, Live 11/10/2014  There are no preventive care reminders to display for this patient.    Past Medical History:  Diagnosis Date   Breast cancer ([facility] 11/12/2012   [place] oct [place] [facility]- Invasive ductal carcinoma, s/o bilat mastectomy with + margin  - also for XRT soon   Bronchiectasis    Chronic fatigue fibromyalgia syndrome 11/12/2012   COPD (chronic obstructive pulmonary disease) (HCC)    DVT, lower extremity (HCC)    LLE   Fibromyalgia    GERD (gastroesophageal reflux disease)    History of shingles    Hyperlipidemia    Hypertension    Impaired glucose tolerance 11/14/2013   Migraine    Osteopenia    Osteoporosis 10/16/2007   Qualifier: Diagnosis of  By: JJenny ReichmannMD, JHunt Oris   Pulmonary Mycobacterium avium complex (MAC) infection ([facility]    Squamous cell skin cancer, multiple sites    Vaccine counseling 01/18/2022   Past Surgical History:  Procedure Laterality Date   BRONCHIAL WASHINGS  10/17/2022   Procedure: BRONCHIAL WASHINGS;  Surgeon: ARigoberto Noel MD;  [facility]: [facility];  Service: Cardiopulmonary;;   FOOT SURGERY     mastectomy bilateral oct 2013     shoulder impingement     VIDEO BRONCHOSCOPY N/A 05/07/2014   Procedure: VIDEO BRONCHOSCOPY WITHOUT FLUORO;  Surgeon: PElsie Stain MD;  Location: [facility];  Service: Cardiopulmonary;  Laterality: N/A;   VIDEO BRONCHOSCOPY N/A 10/17/2022   Procedure: VIDEO BRONCHOSCOPY WITHOUT FLUORO;  Surgeon: ARigoberto Noel  MD;  [facility]: [facility];  Service: Cardiopulmonary;  Laterality: N/A;    reports that she quit smoking about 37 years ago. Her smoking use included cigarettes. She has a 25.00 pack-year smoking history. She has never used smokeless tobacco. She reports that she does not drink alcohol and does not use drugs. family history includes Breast cancer in an other family member; Colon polyps in her father;  Heart disease in an other family member; Hyperlipidemia in an other family member; Hypertension in an other family member; Lung cancer in her father; Other in an other family member. Allergies  Allergen Reactions   Meloxicam Anaphylaxis    Increased blood pressure    Losartan     headache   Adhesive [Tape]     Blisters   Ciprofloxacin     REACTION: tendinitis   Clarithromycin     REACTION: severe reflux   Prednisone     REACTION: irregular heartbeat, not able to sleep   Current Outpatient Medications on File Prior to Visit  Medication Sig Dispense Refill   albuterol (PROVENTIL) (2.5 MG/3ML) 0.083% nebulizer solution USE 1 VIAL VIA NEBULIZER EVERY 6 HOURS AS NEEDED FOR WHEEZING OR SHORTNESS OF BREATH (Patient taking differently: Take 2.5 mg by nebulization every 6 (six) hours as needed for wheezing or shortness of breath.) 75 mL 2   amLODipine (NORVASC) 5 MG tablet Take 1 tablet (5 mg total) by mouth daily. 90 tablet 3   aspirin 81 MG EC tablet Take 81 mg by mouth daily.     atorvastatin (LIPITOR) 20 MG tablet Take 1 tablet (20 mg total) by mouth daily. 90 tablet 3   calcium citrate-vitamin D (CITRACAL+D) 315-200 MG-UNIT per tablet Take 2 tablets by mouth daily.      carboxymethylcellulose (REFRESH PLUS) 0.5 % SOLN Place 1 drop into both eyes 2 (two) times daily as needed (dry eyes).     carisoprodol (SOMA) 350 MG tablet Take 1 tablet (350 mg total) by mouth 2 (two) times daily as needed for muscle spasms. 180 tablet 1   metoprolol tartrate (LOPRESSOR) 25 MG tablet TAKE 1/2 TABLET(12.5 MG) BY MOUTH TWICE DAILY 90 tablet 3   olmesartan-hydrochlorothiazide (BENICAR HCT) 20-12.5 MG tablet TAKE 1/2 TABLET BY MOUTH DAILY 45 tablet 3   promethazine-dextromethorphan (PROMETHAZINE-DM) 6.25-15 MG/5ML syrup TAKE 5 ML BY MOUTH FOUR TIMES DAILY AS NEEDED FOR COUGH (Patient taking differently: Take 5 mLs by mouth 4 (four) times daily as needed for cough.) 473 mL 0   Respiratory Therapy Supplies  (FLUTTER) DEVI Use as directed. (Patient taking differently: Use as directed.  Docia Chuck.  Has a smart vest as well.) 1 each 0   Sodium Chloride, Inhalant, 7 % NEBU Inhale 3 mLs into the lungs 2 (two) times daily.     No current facility-administered medications on file prior to visit.        ROS:  All others reviewed and negative.  Objective        PE:  BP 120/62 (BP Location: Left Arm, Patient Position: Sitting, Cuff Size: Normal)   Pulse (!) 57   Temp 97.9 F (36.6 C) (Oral)   Ht 5' 5.5" (1.664 m)   Wt 113 lb (51.3 kg)   SpO2 97%   BMI 18.52 kg/m                 Constitutional: Pt appears in NAD               HENT: Head: NCAT.  Right Ear: External ear normal.                 Left Ear: External ear normal.                Eyes: . Pupils are equal, round, and reactive to light. Conjunctivae and EOM are normal               Nose: without d/c or deformity               Neck: Neck supple. Gross normal ROM               Cardiovascular: Normal rate and regular rhythm.                 Pulmonary/Chest: Effort normal and breath sounds decresaed without rales or wheezing.                Abd:  Soft, NT, ND, + BS, no organomegaly               Neurological: Pt is alert. At baseline orientation, motor grossly intact               Skin: Skin is warm. No rashes, no other new lesions, LE edema - none               Psychiatric: Pt behavior is normal without agitation   Micro: none  Cardiac tracings I have personally interpreted today:  none  Pertinent Radiological findings (summarize): none   Lab Results  Component Value Date   WBC 7.2 12/16/2021   HGB 14.4 12/16/2021   HCT 43.8 12/16/2021   PLT 218.0 12/16/2021   GLUCOSE 91 12/16/2021   CHOL 167 12/16/2021   TRIG 60.0 12/16/2021   HDL 72.00 12/16/2021   LDLDIRECT 124.4 11/14/2013   LDLCALC 83 12/16/2021   ALT 19 12/16/2021   AST 24 12/16/2021   NA 139 12/16/2021   K 4.3 12/16/2021   CL 101 12/16/2021   CREATININE  0.89 12/16/2021   BUN 22 12/16/2021   CO2 29 12/16/2021   TSH 5.21 12/16/2021   INR 0.9 02/15/2021   HGBA1C 6.3 12/16/2021   Assessment/Plan:  Diane Haney is a 77 y.o. White or Caucasian [1] female with  has a past medical history of Breast cancer ([place]) (11/12/2012), Bronchiectasis, Chronic fatigue fibromyalgia syndrome (11/12/2012), COPD (chronic obstructive pulmonary disease) ([place]), DVT, lower extremity ([place]), Fibromyalgia, GERD (gastroesophageal reflux disease), History of shingles, Hyperlipidemia, Hypertension, Impaired glucose tolerance (11/14/2013), Migraine, Osteopenia, Osteoporosis (10/16/2007), Pulmonary Mycobacterium avium complex (MAC) infection ([place]), Squamous cell skin cancer, multiple sites, and Vaccine counseling (01/18/2022).  Essential hypertension BP Readings from Last 3 Encounters:  12/21/22 120/62  11/16/22 124/66  10/17/22 (!) 116/57   Stable, pt to continue medical treatment norvasc 5 qd, benicar hct 20 - 12.5 qd, lopressor 12.5 bid   Hyperlipidemia Lab Results  Component Value Date   LDLCALC 83 12/16/2021   Stable, pt to continue current statin lipitor 20 mg qd   Insomnia Ok for refill tamazepam 15 mg qhs prn - done erx  Elevated glucose Also for A1c with labs  Right hand pain C/w possible CTS and trigger finger - for hand surgury referral  Chronic chest pain Overall stable, but needs gabapentin refill  Followup: No follow-ups on file.  Cathlean Cower, MD 12/21/2022 1:15 PM [facility]

## 2022-12-21 NOTE — Assessment & Plan Note (Signed)
Ok for refill tamazepam 15 mg qhs prn - done erx

## 2022-12-26 ENCOUNTER — Ambulatory Visit (INDEPENDENT_AMBULATORY_CARE_PROVIDER_SITE_OTHER): Payer: Medicare Other

## 2022-12-26 ENCOUNTER — Encounter: Payer: Self-pay | Admitting: Physician Assistant

## 2022-12-26 ENCOUNTER — Ambulatory Visit (INDEPENDENT_AMBULATORY_CARE_PROVIDER_SITE_OTHER): Payer: Medicare Other | Admitting: Physician Assistant

## 2022-12-26 DIAGNOSIS — M79641 Pain in right hand: Secondary | ICD-10-CM

## 2022-12-26 NOTE — Progress Notes (Signed)
Office Visit Note   Patient: Diane Haney           Date of Birth: [birth year]           MRN: [id number] Visit Date: 12/26/2022              Requested by: Biagio Borg, MD [place],  [place] [address] PCP: Biagio Borg, MD  Chief Complaint  Patient presents with   Right Hand - Pain      HPI: Diane Haney comes in today with a long history of difficulties with her right hand ring finger and pinky finger.  She does have a history of some type of trigger finger release many many years ago but she is unsure exactly what was done.  She has managed fairly well however she is having some increasing pain in the ring finger and some triggering.  She just says it aches as well.  She does like to do art work and is right-hand dominant and sometimes bothers her.  Assessment & Plan: Visit Diagnoses:  1. Pain of right hand     Plan: She is not interested in any type of interventional testing or referral.  She says this is not a big problem but presents it more as a nuisance.  Given she enjoys her art I have encouraged her to try connecting with our occupational therapist to work on her hand.  Also try Voltaren topical gel will follow-up if she does not have improvement  Follow-Up Instructions: Return if symptoms worsen or fail to improve.   Ortho Exam  Patient is alert, oriented, no adenopathy, well-dressed, normal affect, normal respiratory effort. Examination of her right hand no redness no erythema pulses intact.  She does have some triggering of the ring finger but does have full active range of motion.  No warmth.  Fingers are warm.  Paresthesias only at the very tips of the finger.  Negative Tinel's sign  Imaging: XR Hand Complete Right  Result Date: 12/26/2022 Radiographs of her right hand demonstrate no significant degenerative changes no fractures no dislocation  No images are attached to the encounter.  Labs: Lab Results  Component Value Date   HGBA1C 6.3  12/21/2022   HGBA1C 6.3 12/16/2021   HGBA1C 6.0 12/15/2020   ESRSEDRATE 12 06/05/2007   REPTSTATUS 10/20/2022 FINAL 10/17/2022   GRAMSTAIN  10/17/2022    RARE WBC PRESENT,BOTH PMN AND MONONUCLEAR FEW GRAM POSITIVE COCCI    CULT  10/17/2022    FEW Normal respiratory flora-no Staph aureus or Pseudomonas seen Performed at [facility], [place] [address], [place], [place] [address]      Lab Results  Component Value Date   ALBUMIN 4.3 12/21/2022   ALBUMIN 4.4 12/16/2021   ALBUMIN 4.3 02/15/2021    No results found for: "MG" Lab Results  Component Value Date   VD25OH 48.23 12/21/2022   VD25OH 50.52 12/16/2021   VD25OH 56.72 12/15/2020    No results found for: "PREALBUMIN"    Latest Ref Rng & Units 12/21/2022   11:25 AM 12/16/2021   11:27 AM 05/17/2021   10:50 AM  CBC EXTENDED  WBC 4.0 - 10.5 K/uL 7.5  7.2  7.6   RBC 3.87 - 5.11 Mil/uL 4.52  4.52  4.23   Hemoglobin 12.0 - 15.0 g/dL 14.8  14.4  13.7   HCT 36.0 - 46.0 % 43.5  43.8  40.5   Platelets 150.0 - 400.0 K/uL 242.0  218.0  213.0   NEUT# 1.4 - 7.7 K/uL 5.3  5.0  5.1   Lymph# 0.7 - 4.0 K/uL 1.1  1.2  1.2      There is no height or weight on file to calculate BMI.  Orders:  Orders Placed This Encounter  Procedures   XR Hand Complete Right   Ambulatory referral to Occupational Therapy   No orders of the defined types were placed in this encounter.    Procedures: No procedures performed  Clinical Data: No additional findings.  ROS:  All other systems negative, except as noted in the HPI. Review of Systems  Objective: Vital Signs: There were no vitals taken for this visit.  Specialty Comments:  No specialty comments available.  PMFS History: Patient Active Problem List   Diagnosis Date Noted   Right hand pain 12/21/2022   Chronic chest pain 12/21/2022   Vaccine counseling 01/18/2022   Aortic atherosclerosis ([place]) 12/16/2021   Tinnitus of left ear 08/03/2020   Pulmonary nodules 09/11/2019    Atrial tachycardia 09/04/2019   Cold intolerance 11/20/2018   Chronic eczematous otitis externa of both ears 03/13/2018   Dizziness 01/04/2018   Palpitations 01/04/2018   Coronary artery calcification seen on CT scan 12/07/2017   Osteopenia of multiple sites 11/30/2017   Hypermetropia of both eyes 08/31/2017   PCO (posterior capsular opacification), right 08/31/2017   Dermatochalasis of both upper eyelids 08/31/2017   Posterior vitreous detachment of both eyes 08/31/2017   Pseudophakia of both eyes 08/31/2017   Keratoconjunctivitis sicca of both eyes not specified as Sjogren's 02/26/2017   Low back pain 12/06/2016   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   Encounter for follow-up surveillance of breast cancer 11/24/2015   Encounter for monitoring aromatase inhibitor therapy 11/24/2015   Insomnia 01/28/2015   Abnormal finding on mammography 10/07/2014   Ankle arthropathy 10/07/2014   Breast CA ([place]) 10/07/2014   Chronic cough 10/07/2014   Abdominal tenderness, epigastric 10/07/2014   Headache, migraine 10/07/2014   Raynaud's syndrome 10/07/2014   Plantar fasciitis 10/07/2014   Abdominal tenderness, RUQ (right upper quadrant) 10/07/2014   Dysuria 10/07/2014   Elevated glucose 10/07/2014   Flail joint, unspecified ankle and foot 10/07/2014   Raynaud's phenomenon 10/07/2014   Sensorineural hearing loss (SNHL) of both ears 10/07/2014   Voice disturbance 10/07/2014   Breast cancer ([place]) 11/12/2012   Chronic fatigue fibromyalgia syndrome 11/12/2012   Peripheral edema 02/11/2012   Preventative health care 11/04/2011   Bronchiectasis without complication ([place]) 57/32/2025   Microscopic hematuria 10/31/2010   Mycobacterium avium-intracellulare infection ([place]) 10/16/2007   Hyperlipidemia 10/16/2007   COMMON MIGRAINE 10/16/2007   Essential hypertension 10/16/2007   GERD 10/16/2007   Fibromyalgia 10/16/2007   Osteoporosis 10/16/2007   SHINGLES, HX OF 10/16/2007   Myalgia and myositis,  unspecified 10/16/2007   Past Medical History:  Diagnosis Date   Breast cancer ([place]) 11/12/2012   Dx oct 2013 - [place] - Invasive ductal carcinoma, s/o bilat mastectomy with + margin  - also for XRT soon   Bronchiectasis    Chronic fatigue fibromyalgia syndrome 11/12/2012   COPD (chronic obstructive pulmonary disease) ([place])    DVT, lower extremity (HCC)    LLE   Fibromyalgia    GERD (gastroesophageal reflux disease)    History of shingles    Hyperlipidemia    Hypertension    Impaired glucose tolerance 11/14/2013   Migraine    Osteopenia    Osteoporosis 10/16/2007   Qualifier: Diagnosis of  By: Jenny Reichmann MD, Jeneen Rinks  W    Pulmonary Mycobacterium avium complex (MAC) infection ([place])    Squamous cell skin cancer, multiple sites    Vaccine counseling 01/18/2022    Family History  Problem Relation Age of Onset   Lung cancer Father    Colon polyps Father    Heart disease Other        grandparents   Breast cancer Other    Hyperlipidemia Other    Hypertension Other    Other Other        alcholism / addiction    Past Surgical History:  Procedure Laterality Date   BRONCHIAL WASHINGS  10/17/2022   Procedure: BRONCHIAL WASHINGS;  Surgeon: Rigoberto Noel, MD;  [facility];  Service: Cardiopulmonary;;   FOOT SURGERY     mastectomy bilateral oct 2013     shoulder impingement     VIDEO BRONCHOSCOPY N/A 05/07/2014   Procedure: VIDEO BRONCHOSCOPY WITHOUT FLUORO;  Surgeon: Elsie Stain, MD;  [facility];  Service: Cardiopulmonary;  Laterality: N/A;   VIDEO BRONCHOSCOPY N/A 10/17/2022   Procedure: VIDEO BRONCHOSCOPY WITHOUT FLUORO;  Surgeon: Rigoberto Noel, MD;  [facility];  Service: Cardiopulmonary;  Laterality: N/A;   Social History   Occupational History   Occupation: previously part-time MD's office  Tobacco Use   Smoking status: Former    Packs/day: 1.00    Years: 25.00    Total pack years: 25.00    Types: Cigarettes    Quit date:  12/11/1985    Years since quitting: 37.0   Smokeless tobacco: Never   Tobacco comments:    pt does not smoke  Vaping Use   Vaping Use: Never used  Substance and Sexual Activity   Alcohol use: No    Comment: occasional   Drug use: No   Sexual activity: Not on file

## 2023-01-02 ENCOUNTER — Other Ambulatory Visit: Payer: Self-pay

## 2023-01-02 ENCOUNTER — Encounter: Payer: Self-pay | Admitting: Rehabilitative and Restorative Service Providers"

## 2023-01-02 ENCOUNTER — Ambulatory Visit (INDEPENDENT_AMBULATORY_CARE_PROVIDER_SITE_OTHER): Payer: Medicare Other | Admitting: Rehabilitative and Restorative Service Providers"

## 2023-01-02 DIAGNOSIS — R202 Paresthesia of skin: Secondary | ICD-10-CM

## 2023-01-02 DIAGNOSIS — R278 Other lack of coordination: Secondary | ICD-10-CM

## 2023-01-02 DIAGNOSIS — M6281 Muscle weakness (generalized): Secondary | ICD-10-CM

## 2023-01-02 DIAGNOSIS — M25631 Stiffness of right wrist, not elsewhere classified: Secondary | ICD-10-CM | POA: Diagnosis not present

## 2023-01-02 DIAGNOSIS — M79641 Pain in right hand: Secondary | ICD-10-CM | POA: Diagnosis not present

## 2023-01-02 DIAGNOSIS — M25641 Stiffness of right hand, not elsewhere classified: Secondary | ICD-10-CM

## 2023-01-02 NOTE — Therapy (Signed)
OUTPATIENT OCCUPATIONAL THERAPY ORTHO EVALUATION  Patient Name: Diane Haney MRN: [id number] DOB:[birth year], 77 y.o., female Today's Date: 01/02/2023  PCP: Cathlean Cower, MD REFERRING PROVIDER: Persons, Bevely Palmer, [place]   END OF SESSION:  OT End of Session - 01/02/23 1503     Visit Number 1    Number of Visits 10    Date for OT Re-Evaluation 02/16/23    Authorization Type Medicare A & B    Progress Note Due on Visit 10    OT Start Time 1450    OT Stop Time 1529    OT Time Calculation (min) 39 min    Activity Tolerance Patient tolerated treatment well;Patient limited by fatigue;Patient limited by pain    Behavior During Therapy [facility] for tasks assessed/performed             Past Medical History:  Diagnosis Date   Breast cancer ([place]) 11/12/2012   Dx oct 2013 - [place] - Invasive ductal carcinoma, s/o bilat mastectomy with + margin  - also for XRT soon   Bronchiectasis    Chronic fatigue fibromyalgia syndrome 11/12/2012   COPD (chronic obstructive pulmonary disease) ([place])    DVT, lower extremity (HCC)    LLE   Fibromyalgia    GERD (gastroesophageal reflux disease)    History of shingles    Hyperlipidemia    Hypertension    Impaired glucose tolerance 11/14/2013   Migraine    Osteopenia    Osteoporosis 10/16/2007   Qualifier: Diagnosis of  By: Jenny Reichmann MD, Hunt Oris    Pulmonary Mycobacterium avium complex (MAC) infection ([place])    Squamous cell skin cancer, multiple sites    Vaccine counseling 01/18/2022   Past Surgical History:  Procedure Laterality Date   BRONCHIAL WASHINGS  10/17/2022   Procedure: BRONCHIAL WASHINGS;  Surgeon: Rigoberto Noel, MD;  Location: [facility];  Service: Cardiopulmonary;;   FOOT SURGERY     mastectomy bilateral oct 2013     shoulder impingement     VIDEO BRONCHOSCOPY N/A 05/07/2014   Procedure: VIDEO BRONCHOSCOPY WITHOUT FLUORO;  Surgeon: Elsie Stain, MD;  Location: [facility];  Service: Cardiopulmonary;  Laterality: N/A;    VIDEO BRONCHOSCOPY N/A 10/17/2022   Procedure: VIDEO BRONCHOSCOPY WITHOUT FLUORO;  Surgeon: Rigoberto Noel, MD;  Location: [facility];  Service: Cardiopulmonary;  Laterality: N/A;   Patient Active Problem List   Diagnosis Date Noted   Right hand pain 12/21/2022   Chronic chest pain 12/21/2022   Vaccine counseling 01/18/2022   Aortic atherosclerosis ([place]) 12/16/2021   Tinnitus of left ear 08/03/2020   Pulmonary nodules 09/11/2019   Atrial tachycardia 09/04/2019   Cold intolerance 11/20/2018   Chronic eczematous otitis externa of both ears 03/13/2018   Dizziness 01/04/2018   Palpitations 01/04/2018   Coronary artery calcification seen on CT scan 12/07/2017   Osteopenia of multiple sites 11/30/2017   Hypermetropia of both eyes 08/31/2017   PCO (posterior capsular opacification), right 08/31/2017   Dermatochalasis of both upper eyelids 08/31/2017   Posterior vitreous detachment of both eyes 08/31/2017   Pseudophakia of both eyes 08/31/2017   Keratoconjunctivitis sicca of both eyes not specified as Sjogren's 02/26/2017   Low back pain 12/06/2016   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   Encounter for follow-up surveillance of breast cancer 11/24/2015   Encounter for monitoring aromatase inhibitor therapy 11/24/2015   Insomnia 01/28/2015   Abnormal finding on mammography 10/07/2014   Ankle arthropathy 10/07/2014   Breast CA ([place]) 10/07/2014   Chronic  cough 10/07/2014   Abdominal tenderness, epigastric 10/07/2014   Headache, migraine 10/07/2014   Raynaud's syndrome 10/07/2014   Plantar fasciitis 10/07/2014   Abdominal tenderness, RUQ (right upper quadrant) 10/07/2014   Dysuria 10/07/2014   Elevated glucose 10/07/2014   Flail joint, unspecified ankle and foot 10/07/2014   Raynaud's phenomenon 10/07/2014   Sensorineural hearing loss (SNHL) of both ears 10/07/2014   Voice disturbance 10/07/2014   Breast cancer ([place]) 11/12/2012   Chronic fatigue fibromyalgia syndrome 11/12/2012    Peripheral edema 02/11/2012   Preventative health care 11/04/2011   Bronchiectasis without complication ([place]) 36/14/4315   Microscopic hematuria 10/31/2010   Mycobacterium avium-intracellulare infection ([place]) 10/16/2007   Hyperlipidemia 10/16/2007   COMMON MIGRAINE 10/16/2007   Essential hypertension 10/16/2007   GERD 10/16/2007   Fibromyalgia 10/16/2007   Osteoporosis 10/16/2007   SHINGLES, HX OF 10/16/2007   Myalgia and myositis, unspecified 10/16/2007    ONSET DATE: chronic (years)   REFERRING DIAG: M79.641 (ICD-10-CM) - Pain of right hand   THERAPY DIAG:  Muscle weakness (generalized)  Other lack of coordination  Pain in right hand  Stiffness of right wrist, not elsewhere classified  Stiffness of right hand, not elsewhere classified  Paresthesia of skin  Rationale for Evaluation and Treatment: Rehabilitation  SUBJECTIVE:   SUBJECTIVE STATEMENT: She states her Rt RF and SF have "burning" pain with activity and sometimes in the night but not as often as during the day.  She states having a tremor in that hand and she is unsure when it started or what is the cause.  She also states her ring finger "locks "and extension and is painful.  She demonstrates this which appears is a swan-neck deformity that is flexible.  She states that baking, painting, typing, writing really bother/hurt her hand.  She also states history of nerve problems including back pain and issues.   PERTINENT HISTORY: Per PA notes: "Armenta comes in today with a long history of difficulties with her right hand ring finger and pinky finger. She does have a history of some type of trigger finger release many many years ago but she is unsure exactly what was done. She has managed fairly well however she is having some increasing pain in the ring finger and some triggering. She just says it aches as well. She does like to do art work and is right-hand dominant and sometimes bothers her. "   PRECAUTIONS: TAPE  allergy; WEIGHT BEARING RESTRICTIONS: No  PAIN:  Are you having pain? Yes in Rt hand RF, SF Rating: 5/10 at rest now, up to 7/10 at worst in past week   FALLS: Has patient fallen in last 6 months? No  PLOF: Independent  PATIENT GOALS: To have less pain in right dominant hand when using it for daily activities.   OBJECTIVE: (All objective assessments below are from initial evaluation on: 01/02/23 unless otherwise specified.)   HAND DOMINANCE: Right   ADLs: Overall ADLs: States decreased ability to grab, hold household objects, pain and inability to open containers, perform FMS tasks (manipulate fasteners on clothing), mild to moderate bathing problems as well.    FUNCTIONAL OUTCOME MEASURES: Eval: Quck DASH 42.5% impairment today  (Higher % Score  =  More Impairment)    UPPER EXTREMITY ROM     Shoulder to Wrist AROM Right eval Left eval  Forearm supination    Forearm pronation     Wrist flexion 83 88  Wrist extension 58 67  (Blank rows = not tested)  Hand AROM Right eval  Full Fist Ability (or Gap to Distal Palmar Crease) Yes, but tight ness in RF, SF  Thumb Opposition  (Kapandji Scale)    Thumb MCP (0-60)   Thumb IP (0-80)   Thumb Radial Abduction Span   Thumb Palmar Abduction Span   (Blank rows = not tested)   UPPER EXTREMITY MMT:    Eval: NT at eval due to pain and not necessary for initial therapy diagnosis.  Details will be determined as tolerated and necessary.  MMT Right TBD  Forearm supination   Forearm pronation   Wrist flexion   Wrist extension   Wrist ulnar deviation   Wrist radial deviation   (Blank rows = not tested)  HAND FUNCTION: Eval: Observed weakness in affected hand.  Grip strength Right: 32 lbs, Left: 40 lbs  Lateral pinch Rt 8#; Lt 14#  COORDINATION: Eval: Observed coordination impairments with affected Rt hand, details to be determined when time allows. Box and Blocks Test: Rt TBD Blocks today (TBD is [facility]); 9 Hole Peg Test  Right: TBDsec, Left: TBD   SENSATION: Eval: Light touch impaired in Rt hand SF, RF now. 2PD in Rt hand:  76m SF, 166mRF, 46m18mF, 46mm37mumb (left hand ulnar nerve distribution is 5 mm as well.)  EDEMA:   Eval: None overt today.  COGNITION: Eval: Overall cognitive status: WFL for evaluation today   OBSERVATIONS:   Eval: She is numb in right hand ulnar nerve distribution, she has weak intrinsics, weak lateral pinch (painful at thumb), and apparent "swan necking" in RF.  Neck extension is a bit tight but does not increase symptoms, neck flexion is better and also does not increase symptoms.  Further screening at the neck and shoulders may be indicated, but today she presents as cubital tunnel syndrome of a chronic nature as well as weakness and swan-neck deformities in the right ring finger and small finger as well (likely exacerbated by ulnar nerve issue).   TODAY'S TREATMENT:  Post-evaluation treatment: She was given the following education for home exercise program including nerve gliding, stretches for the finger and the wrist as well as light hand strengthening.  She was taught to do these carefully as tolerated without increase in symptoms.  She demonstrates some back with no major increase in symptoms today.  Additionally she was educated about ulnar nerve impingement-to avoid pressure on the medial epicondyle, to avoid pressing on Guyon's canal area, and also to avoid excessively bent elbow in the day or the night.  She states understanding.  Exercises - Ulnar Nerve Flossing  - 3-4 x daily - 1-2 sets - 5-10 reps - Wrist Prayer Stretch  - 3 x daily - 3 reps - 15 sec hold - Wrist Flexion Stretch  - 3 x daily - 3 reps - 15 sec hold - HOOK Stretch  - 3 x daily - 3 reps - 15-20 sec hold - Seated Finger Composite Flexion Stretch  - 3 x daily - 3 reps - 15 hold - Towel Roll Grip with Forearm in Neutral  - 3 x daily - 5 reps - 10 sec hold   PATIENT EDUCATION: Education details: See tx  section above for details  Person educated: Patient Education method: Verbal Instruction, Teach back, Handouts  Education comprehension: States and demonstrates understanding, Additional Education required    HOME EXERCISE PROGRAM: Access Code: [email or web address]: [email or web address] Date: 01/02/2023 Prepared by: NathBenito MccreedyOALS: Goals reviewed with patient? Yes   SHORT  TERM GOALS: (STG required if POC>30 days) Target Date: 01/19/23  Pt will obtain protective, custom orthotic. Goal status: TBD/PRN  2.  Pt will demo/state understanding of initial HEP to improve pain levels and prerequisite motion. Goal status: INITIAL   LONG TERM GOALS: Target Date: 02/16/23  Pt will improve functional ability by decreased impairment per Quick DASH assessment from 42.5% to 20% or better, for better quality of life. Goal status: INITIAL  2.  Pt will improve grip strength in Rt hand from painful 32lbs to at least non-painful 40lbs for functional use at home and in IADLs. Goal status: INITIAL  3.  Pt will improve A/ROM in Rt wrist ext from 58* to at least 65*, to have functional motion for tasks like reach and grasp.  Goal status: INITIAL  4.   Pt will improve coordination skills in Rt hand, as seen by [facility] score on Box and Blocks testing to have increased functional ability to carry out fine motor tasks (fasteners, etc.) and more complex, coordinated IADLs (meal prep, sports, etc.).  Goal status: INITIAL (pending BBT next session)   5.  Pt will decrease pain at rest from 5/10 to 2/10 or better to have better sleep and occupational participation in daily roles. Goal status: INITIAL   ASSESSMENT:  CLINICAL IMPRESSION: Patient is a 77 y.o. female who was seen today for occupational therapy evaluation for chronic right hand pain that presents as an ulnar neuritis or entrapment making weakness and pain to digits 4 and 5.  She also has some swan-neck and deformity possibly  exacerbated by this weakness.  These things limit her daily abilities and cause pain and problems, and she will benefit from outpatient occupational therapy to increase her quality of life and help decrease her symptoms.   PERFORMANCE DEFICITS: in functional skills including ADLs, IADLs, coordination, dexterity, sensation, ROM, strength, pain, fascial restrictions, flexibility, Fine motor control, Gross motor control, body mechanics, endurance, decreased knowledge of precautions, and UE functional use, cognitive skills including memory, problem solving, and safety awareness, and psychosocial skills including coping strategies, environmental adaptation, and habits.   IMPAIRMENTS: are limiting patient from ADLs, IADLs, work, and leisure.   COMORBIDITIES: has co-morbidities such as fibromyalgia, osteoporosis, fatigue, hx of breast CA, raynaud's, tinnitus, and more  that affects occupational performance. Patient will benefit from skilled OT to address above impairments and improve overall function.  MODIFICATION OR ASSISTANCE TO COMPLETE EVALUATION: No modification of tasks or assist necessary to complete an evaluation.  OT OCCUPATIONAL PROFILE AND HISTORY: Problem focused assessment: Including review of records relating to presenting problem.  CLINICAL DECISION MAKING: Moderate - several treatment options, min-mod task modification necessary  REHAB POTENTIAL: Good  EVALUATION COMPLEXITY: Low      PLAN:  OT FREQUENCY: 1-2x/week  OT DURATION: 6 weeks (through 02/16/23 as needed)   PLANNED INTERVENTIONS: self care/ADL training, therapeutic exercise, therapeutic activity, neuromuscular re-education, manual therapy, passive range of motion, splinting, ultrasound, compression bandaging, moist heat, cryotherapy, contrast bath, patient/family education, energy conservation, and coping strategies training  RECOMMENDED OTHER SERVICES: none now   CONSULTED AND AGREED WITH PLAN OF CARE:  Patient  PLAN FOR NEXT SESSION: Review initial recommendations about ulnar nerve, review home exercises and nerve glides and discuss any changes she might have felt after performing the use for a week.  Consider more in-depth screening of the neck and shoulders as well.Do BBT  Benito Mccreedy, OTR/L, CHT 01/02/2023, 5:20 PM

## 2023-01-04 NOTE — Therapy (Signed)
OUTPATIENT OCCUPATIONAL THERAPY TREATMENT NOTE   Patient Name: Diane Haney MRN: [id number] DOB:[birth year], 77 y.o., female Today's Date: 01/08/2023  PCP: Cathlean Cower, MD REFERRING PROVIDER: Persons, Bevely Palmer, [place]   END OF SESSION:  OT End of Session - 01/08/23 0957     Visit Number 2    Number of Visits 10    Date for OT Re-Evaluation 02/16/23    Authorization Type Medicare A & B    Progress Note Due on Visit 10    OT Start Time 1009    OT Stop Time 1102    OT Time Calculation (min) 53 min    Activity Tolerance Patient tolerated treatment well;Patient limited by fatigue;Patient limited by pain    Behavior During Therapy [facility] for tasks assessed/performed             Past Medical History:  Diagnosis Date   Breast cancer ([place]) 11/12/2012   Dx oct 2013 - [place] - Invasive ductal carcinoma, s/o bilat mastectomy with + margin  - also for XRT soon   Bronchiectasis    Chronic fatigue fibromyalgia syndrome 11/12/2012   COPD (chronic obstructive pulmonary disease) ([place])    DVT, lower extremity (HCC)    LLE   Fibromyalgia    GERD (gastroesophageal reflux disease)    History of shingles    Hyperlipidemia    Hypertension    Impaired glucose tolerance 11/14/2013   Migraine    Osteopenia    Osteoporosis 10/16/2007   Qualifier: Diagnosis of  By: Jenny Reichmann MD, Hunt Oris    Pulmonary Mycobacterium avium complex (MAC) infection ([place])    Squamous cell skin cancer, multiple sites    Vaccine counseling 01/18/2022   Past Surgical History:  Procedure Laterality Date   BRONCHIAL WASHINGS  10/17/2022   Procedure: BRONCHIAL WASHINGS;  Surgeon: Rigoberto Noel, MD;  Location: [facility];  Service: Cardiopulmonary;;   FOOT SURGERY     mastectomy bilateral oct 2013     shoulder impingement     VIDEO BRONCHOSCOPY N/A 05/07/2014   Procedure: VIDEO BRONCHOSCOPY WITHOUT FLUORO;  Surgeon: Elsie Stain, MD;  Location: [facility];  Service: Cardiopulmonary;  Laterality: N/A;    VIDEO BRONCHOSCOPY N/A 10/17/2022   Procedure: VIDEO BRONCHOSCOPY WITHOUT FLUORO;  Surgeon: Rigoberto Noel, MD;  Location: [facility];  Service: Cardiopulmonary;  Laterality: N/A;   Patient Active Problem List   Diagnosis Date Noted   Right hand pain 12/21/2022   Chronic chest pain 12/21/2022   Vaccine counseling 01/18/2022   Aortic atherosclerosis ([place]) 12/16/2021   Tinnitus of left ear 08/03/2020   Pulmonary nodules 09/11/2019   Atrial tachycardia 09/04/2019   Cold intolerance 11/20/2018   Chronic eczematous otitis externa of both ears 03/13/2018   Dizziness 01/04/2018   Palpitations 01/04/2018   Coronary artery calcification seen on CT scan 12/07/2017   Osteopenia of multiple sites 11/30/2017   Hypermetropia of both eyes 08/31/2017   PCO (posterior capsular opacification), right 08/31/2017   Dermatochalasis of both upper eyelids 08/31/2017   Posterior vitreous detachment of both eyes 08/31/2017   Pseudophakia of both eyes 08/31/2017   Keratoconjunctivitis sicca of both eyes not specified as Sjogren's 02/26/2017   Low back pain 12/06/2016   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   Encounter for follow-up surveillance of breast cancer 11/24/2015   Encounter for monitoring aromatase inhibitor therapy 11/24/2015   Insomnia 01/28/2015   Abnormal finding on mammography 10/07/2014   Ankle arthropathy 10/07/2014   Breast CA ([place]) 10/07/2014  Chronic cough 10/07/2014   Abdominal tenderness, epigastric 10/07/2014   Headache, migraine 10/07/2014   Raynaud's syndrome 10/07/2014   Plantar fasciitis 10/07/2014   Abdominal tenderness, RUQ (right upper quadrant) 10/07/2014   Dysuria 10/07/2014   Elevated glucose 10/07/2014   Flail joint, unspecified ankle and foot 10/07/2014   Raynaud's phenomenon 10/07/2014   Sensorineural hearing loss (SNHL) of both ears 10/07/2014   Voice disturbance 10/07/2014   Breast cancer ([place]) 11/12/2012   Chronic fatigue fibromyalgia syndrome 11/12/2012    Peripheral edema 02/11/2012   Preventative health care 11/04/2011   Bronchiectasis without complication ([place]) 91/47/8295   Microscopic hematuria 10/31/2010   Mycobacterium avium-intracellulare infection ([place]) 10/16/2007   Hyperlipidemia 10/16/2007   COMMON MIGRAINE 10/16/2007   Essential hypertension 10/16/2007   GERD 10/16/2007   Fibromyalgia 10/16/2007   Osteoporosis 10/16/2007   SHINGLES, HX OF 10/16/2007   Myalgia and myositis, unspecified 10/16/2007    ONSET DATE: chronic (years)   REFERRING DIAG: M79.641 (ICD-10-CM) - Pain of right hand   THERAPY DIAG:  Muscle weakness (generalized)  Other lack of coordination  Pain in right hand  Paresthesia of skin  Stiffness of right hand, not elsewhere classified  Stiffness of right wrist, not elsewhere classified  Rationale for Evaluation and Treatment: Rehabilitation  PERTINENT HISTORY: Per PA notes: "Gizell comes in today with a long history of difficulties with her right hand ring finger and pinky finger. She does have a history of some type of trigger finger release many many years ago but she is unsure exactly what was done. She has managed fairly well however she is having some increasing pain in the ring finger and some triggering. She just says it aches as well. She does like to do art work and is right-hand dominant and sometimes bothers her. "   PRECAUTIONS: TAPE allergy; WEIGHT BEARING RESTRICTIONS: No   SUBJECTIVE:   SUBJECTIVE STATEMENT: She states feeling a little less pain now, a little tingle through Rt small digits but not bad.   PAIN:  Are you having pain?  Yes in Rt hand RF, SF Rating: 2-3/10 at rest now, up to 6-7/10 at worst in past week  PATIENT GOALS: To have less pain in right dominant hand when using it for daily activities.   OBJECTIVE: (All objective assessments below are from initial evaluation on: 01/02/23 unless otherwise specified.)   HAND DOMINANCE: Right   ADLs: Overall ADLs: States  decreased ability to grab, hold household objects, pain and inability to open containers, perform FMS tasks (manipulate fasteners on clothing), mild to moderate bathing problems as well.    FUNCTIONAL OUTCOME MEASURES: Eval: Quck DASH 42.5% impairment today  (Higher % Score  =  More Impairment)    UPPER EXTREMITY ROM     Shoulder to Wrist AROM Right eval Left eval Rt / Lt 01/08/23  Forearm supination     Forearm pronation      Wrist flexion 83 88   Wrist extension 58 67 49 / 67  (Blank rows = not tested)   Hand AROM Right eval  Full Fist Ability (or Gap to Distal Palmar Crease) Yes, but tight ness in RF, SF  Thumb Opposition  (Kapandji Scale)    Thumb MCP (0-60)   Thumb IP (0-80)   Thumb Radial Abduction Span   Thumb Palmar Abduction Span   (Blank rows = not tested)   UPPER EXTREMITY MMT:    Eval: NT at eval due to pain and not necessary for initial therapy diagnosis.  Details will be determined as tolerated and necessary.  MMT Right TBD  Forearm supination   Forearm pronation   Wrist flexion   Wrist extension   Wrist ulnar deviation   Wrist radial deviation   (Blank rows = not tested)  HAND FUNCTION: 01/08/23: Rt grip: 27#; Lt grip: 29#  (different device today)   Eval: Observed weakness in affected hand.  Grip strength Right: 32 lbs, Left: 40 lbs  Lateral pinch Rt 8#; Lt 14#  COORDINATION: 01/08/23: Box and Blocks Test: Rt 61 Blocks today (at least 60 is [facility])   SENSATION: 01/08/23: 6m SF Rt hand today   Eval: Light touch impaired in Rt hand SF, RF now. 2PD in Rt hand:  163mSF, 1022mF, 5mm59m, 5mm 35mmb (left hand ulnar nerve distribution is 5 mm as well.)  OBSERVATIONS:   01/08/23: SF, RF trembling after doing BBT (despite her not using them, keeping tucked)   Eval: She is numb in right hand ulnar nerve distribution, she has weak intrinsics, weak lateral pinch (painful at thumb), and apparent "swan necking" in RF.  Neck extension is a bit tight but does  not increase symptoms, neck flexion is better and also does not increase symptoms.  Further screening at the neck and shoulders may be indicated, but today she presents as cubital tunnel syndrome of a chronic nature as well as weakness and swan-neck deformities in the right ring finger and small finger as well (likely exacerbated by ulnar nerve issue).   TODAY'S TREATMENT:  01/08/23: She performs new range of motion at the wrist for check, but does not perform better yet.  When OT reviews her exercise program with her in depth, she seemed to have been doing several stretches and correctly and holding nerve glides for long stretch which was somewhat painful to her.  These things were clarified and she went through each exercise with OT slowly teaching back understanding.  Additionally OT and was head and neck active range of motion exercises today to address any upper chain problems and poor stiffness and mobility that she states impact her ability to drive.  She currently does now kind of exercises for her neck her shoulders per self-report.  When she looks in all directions (as listed below) she has no radicular pain and no increase of any symptoms-just feeling some stretches or tension.  Additionally OT also reviews avoiding pressure on the medial elbow, avoiding extremity bent or extended positions of the elbow and avoiding pressure on the pulmonary Guyon's canal with habits postures and daily routines.  She states understanding and being more mindful of these things now.  She completed all of the below with no increase in symptoms today, and she states understanding these things and desires to perform them on her own for a week or 2 before another therapy appointment and OT is in agreement with this.   Exercises - Wrist Flexion Stretch  - 3 x daily - 3 reps - 15 sec hold - Wrist Extension Stretch Pronated  - 3 x daily - 3 reps - 15 hold - HOOK Stretch  - 3 x daily - 3 reps - 15-20 sec hold - Seated  Finger Composite Flexion Stretch  - 3 x daily - 3 reps - 15 hold - Ulnar Nerve Flossing  - 3-4 x daily - 1-2 sets - 5-10 reps - Towel Roll Grip with Forearm in Neutral  - 3 x daily - 5 reps - 10 sec hold - Seated Scapular  Retraction  - 3 x daily - 4 x weekly - 5 reps - 5 second hold - Seated Cervical Extension AROM  - 3 x daily - 4 x weekly - 5-10 reps - Seated Cervical Sidebending AROM  - 3 x daily - 4 x weekly - 5-10 reps - Seated Cervical Rotation AROM  - 3 x daily - 4 x weekly - 5 reps   PATIENT EDUCATION: Education details: See tx section above for details  Person educated: Patient Education method: Veterinary surgeon, Teach back, Handouts  Education comprehension: States and demonstrates understanding, Additional Education required    HOME EXERCISE PROGRAM: Access Code: [email or web address] URL: [email or web address] Date: 01/02/2023 Prepared by: Benito Mccreedy   GOALS: Goals reviewed with patient? Yes   SHORT TERM GOALS: (STG required if POC>30 days) Target Date: 01/19/23  Pt will obtain protective, custom orthotic. Goal status: TBD/PRN  2.  Pt will demo/state understanding of initial HEP to improve pain levels and prerequisite motion. Goal status: 01/08/23 MET   LONG TERM GOALS: Target Date: 02/16/23  Pt will improve functional ability by decreased impairment per Quick DASH assessment from 42.5% to 20% or better, for better quality of life. Goal status: INITIAL  2.  Pt will improve grip strength in Rt hand from painful 32lbs to at least non-painful 40lbs for functional use at home and in IADLs. Goal status: INITIAL  3.  Pt will improve A/ROM in Rt wrist ext from 58* to at least 65*, to have functional motion for tasks like reach and grasp.  Goal status: INITIAL  4.   Pt will improve coordination skills in Rt hand, as seen by [facility] score on Box and Blocks testing to have increased functional ability to carry out fine motor tasks (fasteners, etc.) and more complex,  coordinated IADLs (meal prep, sports, etc.).  Goal status: INITIAL (pending BBT next session)   5.  Pt will decrease pain at rest from 5/10 to 2/10 or better to have better sleep and occupational participation in daily roles. Goal status: INITIAL   ASSESSMENT:  CLINICAL IMPRESSION: 01/08/23: She now has a full exercise program which was gone over in depth with her today and carefully reviewing any areas of question or concern.  She does have a 30-minute drive which is a burden to her so we will withhold therapy for a week giving her a decent amount of time to try to make a change in her current status.  We will recheck in 2 weeks to determine the effect of these treatments.   PLAN:  OT FREQUENCY: 1-2x/week  OT DURATION: 6 weeks (through 02/16/23 as needed)   PLANNED INTERVENTIONS: self care/ADL training, therapeutic exercise, therapeutic activity, neuromuscular re-education, manual therapy, passive range of motion, splinting, ultrasound, compression bandaging, moist heat, cryotherapy, contrast bath, patient/family education, energy conservation, and coping strategies training  CONSULTED AND AGREED WITH PLAN OF CARE: Patient  PLAN FOR NEXT SESSION:  Check on her in 2 weeks in terms of motion pain sensation and symptoms.  Address or review plan of care as needed consider reviewing goals as indicated.   Benito Mccreedy, OTR/L, CHT 01/08/2023, 3:52 PM

## 2023-01-05 ENCOUNTER — Encounter: Payer: Self-pay | Admitting: Internal Medicine

## 2023-01-08 ENCOUNTER — Ambulatory Visit: Payer: Medicare Other | Attending: Physician Assistant | Admitting: Rehabilitative and Restorative Service Providers"

## 2023-01-08 ENCOUNTER — Encounter: Payer: Self-pay | Admitting: Rehabilitative and Restorative Service Providers"

## 2023-01-08 DIAGNOSIS — M25631 Stiffness of right wrist, not elsewhere classified: Secondary | ICD-10-CM | POA: Insufficient documentation

## 2023-01-08 DIAGNOSIS — M25641 Stiffness of right hand, not elsewhere classified: Secondary | ICD-10-CM | POA: Diagnosis not present

## 2023-01-08 DIAGNOSIS — R202 Paresthesia of skin: Secondary | ICD-10-CM

## 2023-01-08 DIAGNOSIS — M6281 Muscle weakness (generalized): Secondary | ICD-10-CM | POA: Diagnosis not present

## 2023-01-08 DIAGNOSIS — M79641 Pain in right hand: Secondary | ICD-10-CM | POA: Insufficient documentation

## 2023-01-08 DIAGNOSIS — R278 Other lack of coordination: Secondary | ICD-10-CM

## 2023-01-11 ENCOUNTER — Other Ambulatory Visit (HOSPITAL_COMMUNITY): Payer: Self-pay

## 2023-01-11 NOTE — Telephone Encounter (Signed)
Pharmacy Patient Advocate Encounter  Prior Authorization for Temazepam has been approved.    PA# [id number] Effective dates: 01/11/2023 until further notice

## 2023-01-11 NOTE — Telephone Encounter (Signed)
Per test claim, refill too soon. Submitted PA anyway,  Patient Advocate Encounter   Received notification from [facility] that prior authorization for Temazepam '15mg'$  is required.   PA submitted on 01/11/2023 Key [id number] Status is pending

## 2023-01-18 NOTE — Therapy (Signed)
OUTPATIENT OCCUPATIONAL THERAPY TREATMENT & DISCHARGE NOTE   Patient Name: Diane Haney MRN: [id number] DOB:[birth year], 77 y.o., female Today's Date: 01/22/2023  PCP: Cathlean Cower, MD REFERRING PROVIDER: Persons, Bevely Palmer, [place]   END OF SESSION:  OT End of Session - 01/22/23 0914     Visit Number 3    Number of Visits 10    Date for OT Re-Evaluation 02/16/23    Authorization Type Medicare A & B    Progress Note Due on Visit 10    OT Start Time 0915    OT Stop Time 0948    OT Time Calculation (min) 33 min    Activity Tolerance Patient tolerated treatment well;No increased pain    Behavior During Therapy [facility] for tasks assessed/performed              Past Medical History:  Diagnosis Date   Breast cancer ([place]) 11/12/2012   Dx oct 2013 - [place] - Invasive ductal carcinoma, s/o bilat mastectomy with + margin  - also for XRT soon   Bronchiectasis    Chronic fatigue fibromyalgia syndrome 11/12/2012   COPD (chronic obstructive pulmonary disease) (HCC)    DVT, lower extremity (HCC)    LLE   Fibromyalgia    GERD (gastroesophageal reflux disease)    History of shingles    Hyperlipidemia    Hypertension    Impaired glucose tolerance 11/14/2013   Migraine    Osteopenia    Osteoporosis 10/16/2007   Qualifier: Diagnosis of  By: Jenny Reichmann MD, Hunt Oris    Pulmonary Mycobacterium avium complex (MAC) infection ([place])    Squamous cell skin cancer, multiple sites    Vaccine counseling 01/18/2022   Past Surgical History:  Procedure Laterality Date   BRONCHIAL WASHINGS  10/17/2022   Procedure: BRONCHIAL WASHINGS;  Surgeon: Rigoberto Noel, MD;  Location: [facility];  Service: Cardiopulmonary;;   FOOT SURGERY     mastectomy bilateral oct 2013     shoulder impingement     VIDEO BRONCHOSCOPY N/A 05/07/2014   Procedure: VIDEO BRONCHOSCOPY WITHOUT FLUORO;  Surgeon: Elsie Stain, MD;  Location: [facility];  Service: Cardiopulmonary;  Laterality: N/A;   VIDEO BRONCHOSCOPY  N/A 10/17/2022   Procedure: VIDEO BRONCHOSCOPY WITHOUT FLUORO;  Surgeon: Rigoberto Noel, MD;  Location: [facility];  Service: Cardiopulmonary;  Laterality: N/A;   Patient Active Problem List   Diagnosis Date Noted   Right hand pain 12/21/2022   Chronic chest pain 12/21/2022   Vaccine counseling 01/18/2022   Aortic atherosclerosis ([place]) 12/16/2021   Tinnitus of left ear 08/03/2020   Pulmonary nodules 09/11/2019   Atrial tachycardia 09/04/2019   Cold intolerance 11/20/2018   Chronic eczematous otitis externa of both ears 03/13/2018   Dizziness 01/04/2018   Palpitations 01/04/2018   Coronary artery calcification seen on CT scan 12/07/2017   Osteopenia of multiple sites 11/30/2017   Hypermetropia of both eyes 08/31/2017   PCO (posterior capsular opacification), right 08/31/2017   Dermatochalasis of both upper eyelids 08/31/2017   Posterior vitreous detachment of both eyes 08/31/2017   Pseudophakia of both eyes 08/31/2017   Keratoconjunctivitis sicca of both eyes not specified as Sjogren's 02/26/2017   Low back pain 12/06/2016   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   Encounter for follow-up surveillance of breast cancer 11/24/2015   Encounter for monitoring aromatase inhibitor therapy 11/24/2015   Insomnia 01/28/2015   Abnormal finding on mammography 10/07/2014   Ankle arthropathy 10/07/2014   Breast CA ([place]) 10/07/2014   Chronic  cough 10/07/2014   Abdominal tenderness, epigastric 10/07/2014   Headache, migraine 10/07/2014   Raynaud's syndrome 10/07/2014   Plantar fasciitis 10/07/2014   Abdominal tenderness, RUQ (right upper quadrant) 10/07/2014   Dysuria 10/07/2014   Elevated glucose 10/07/2014   Flail joint, unspecified ankle and foot 10/07/2014   Raynaud's phenomenon 10/07/2014   Sensorineural hearing loss (SNHL) of both ears 10/07/2014   Voice disturbance 10/07/2014   Breast cancer ([place]) 11/12/2012   Chronic fatigue fibromyalgia syndrome 11/12/2012   Peripheral edema 02/11/2012    Preventative health care 11/04/2011   Bronchiectasis without complication ([place]) 0000000   Microscopic hematuria 10/31/2010   Mycobacterium avium-intracellulare infection ([place]) 10/16/2007   Hyperlipidemia 10/16/2007   COMMON MIGRAINE 10/16/2007   Essential hypertension 10/16/2007   GERD 10/16/2007   Fibromyalgia 10/16/2007   Osteoporosis 10/16/2007   SHINGLES, HX OF 10/16/2007   Myalgia and myositis, unspecified 10/16/2007    ONSET DATE: chronic (years)   REFERRING DIAG: M79.641 (ICD-10-CM) - Pain of right hand   THERAPY DIAG:  Muscle weakness (generalized)  Pain in right hand  Other lack of coordination  Stiffness of right hand, not elsewhere classified  Paresthesia of skin  Stiffness of right wrist, not elsewhere classified  Rationale for Evaluation and Treatment: Rehabilitation  PERTINENT HISTORY: Per PA notes: "Tegra comes in today with a long history of difficulties with her right hand ring finger and pinky finger. She does have a history of some type of trigger finger release many many years ago but she is unsure exactly what was done. She has managed fairly well however she is having some increasing pain in the ring finger and some triggering. She just says it aches as well. She does like to do art work and is right-hand dominant and sometimes bothers her. "   PRECAUTIONS: TAPE allergy; WEIGHT BEARING RESTRICTIONS: No   SUBJECTIVE:   SUBJECTIVE STATEMENT: She states her "constant nerve pain" is now gone in her Rt hand small fingers, though still present at times with certain activities.     PAIN:  Are you having pain? Not Rating: 0/10 at rest now, up to 4/10 at worst in past week  PATIENT GOALS: To have less pain in right dominant hand when using it for daily activities.   OBJECTIVE: (All objective assessments below are from initial evaluation on: 01/02/23 unless otherwise specified.)   HAND DOMINANCE: Right   ADLs: Overall ADLs: States decreased  ability to grab, hold household objects, pain and inability to open containers, perform FMS tasks (manipulate fasteners on clothing), mild to moderate bathing problems as well.    FUNCTIONAL OUTCOME MEASURES: 01/22/23: Quick DASH: 11% impairment now   Eval: Quck DASH 42.5% impairment today  (Higher % Score  =  More Impairment)    UPPER EXTREMITY ROM     Shoulder to Wrist AROM Right eval Left eval Rt / Lt 01/08/23 Rt  / Lt  01/22/23  Forearm supination      Forearm pronation       Wrist flexion 83 88  78  Wrist extension 58 67 49 / 67 50  /  65  (Blank rows = not tested)   Hand AROM Right eval  Full Fist Ability (or Gap to Distal Palmar Crease) Yes, but tight ness in RF, SF  Thumb Opposition  (Kapandji Scale)    Thumb MCP (0-60)   Thumb IP (0-80)   Thumb Radial Abduction Span   Thumb Palmar Abduction Span   (Blank rows = not tested)  HAND FUNCTION: 01/24/23: Rt Grip: 40#  01/08/23: Rt grip: 27#; Lt grip: 29#  (different device today)   Eval: Observed weakness in affected hand.  Grip strength Right: 32 lbs, Left: 40 lbs  Lateral pinch Rt 8#; Lt 14#  COORDINATION: 01/08/23: Box and Blocks Test: Rt 61 Blocks today (at least 60 is [facility])   SENSATION: 01/24/23: 48m in Rt SF, fantastically improved   01/08/23: 126mSF Rt hand today   Eval: Light touch impaired in Rt hand SF, RF now. 2PD in Rt hand:  1163mF, 66m24m, 5mm 37m 5mm t47mb (left hand ulnar nerve distribution is 5 mm as well.)  OBSERVATIONS:   01/22/23: Still reports mild paresthesia in ulnar nerve distribution to the right hand, but much improved and much better sensation now.  No overt swan necking deformities today.   TODAY'S TREATMENT:  01/22/23: She performs gripping and active range of motion for new measures today and to help check goals.  Her wrist measures tightly still but everything else is much better.  Sensation is much better today and within normal limits now.  OT reviews her home exercise program  and she states not having a problem or question about any of them.  Her functional activities and goals were reviewed with her and she states having much less problems with things now.  We discussed continuing to monitor habits and postures (no sustained elbow flexion for long periods of time and no weightbearing on elbow near the nerve).  She states understanding these things and is happy to discharge feeling much better now.  Additionally OT does tell her she can come back if swan-neck deformities worsen for any reason for "oval 8" splints or other options.  She states understanding.   PATIENT EDUCATION: Education details: See tx section above for details  Person educated: Patient Education method: Verbal Instruction, Teach back, Handouts  Education comprehension: States and demonstrates understanding, Additional Education required    HOME EXERCISE PROGRAM: Access Code: [email or web address]   GOALS: Goals reviewed with patient? Yes   SHORT TERM GOALS: (STG required if POC>30 days) Target Date: 01/19/23   Pt will demo/state understanding of initial HEP to improve pain levels and prerequisite motion. Goal status: 01/08/23 MET   LONG TERM GOALS: Target Date: 02/16/23  Pt will improve functional ability by decreased impairment per Quick DASH assessment from 42.5% to 20% or better, for better quality of life. Goal status: 01/22/23: MET   2.  Pt will improve grip strength in Rt hand from painful 32lbs to at least non-painful 40lbs for functional use at home and in IADLs. Goal status: 01/22/23: MET  3.  Pt will improve A/ROM in Rt wrist ext from 58* to at least 65*, to have functional motion for tasks like reach and grasp.  Goal status: 01/24/23: Not Met (she was cautioned about this)   4.   Pt will improve coordination skills in Rt hand, [facility] Box and Blocks testing to have increased functional ability to carry out fine motor tasks (fasteners,  etc.) and more complex, coordinated IADLs (meal prep, sports, etc.).  Goal status: 01/08/23: MET  5.  Pt will decrease pain at rest from 5/10 to 2/10 or better to have better sleep and occupational participation in daily roles. Goal status: 01/22/23: MET    ASSESSMENT:  CLINICAL IMPRESSION: 01/22/23: She has excellent sensation now, no significant pain at rest, much better pain during the weeks and better functional ability.  She  meets all goals (except for wrist motion which is still tight and she was cautioned about this), and will discharge today successfully.   PLAN:  OT FREQUENCY: D/C now   OT DURATION: D/C now   PLANNED INTERVENTIONS: self care/ADL training, therapeutic exercise, therapeutic activity, neuromuscular re-education, manual therapy, passive range of motion, splinting, ultrasound, compression bandaging, moist heat, cryotherapy, contrast bath, patient/family education, energy conservation, and coping strategies training  CONSULTED AND AGREED WITH PLAN OF CARE: Patient  PLAN FOR NEXT SESSION:  D/C now   Benito Mccreedy, OTR/L, CHT 01/22/2023, 9:56 AM    OCCUPATIONAL THERAPY DISCHARGE SUMMARY  Visits from Start of Care: 3  Current functional level related to goals / functional outcomes: Pt has met all goals to satisfactory levels and is pleased with outcomes.   Remaining deficits: Pt has no more significant functional deficits or pain.   Education / Equipment: Pt has all needed materials and education. Pt understands how to continue on with self-management. See tx notes for more details.   Patient agrees to discharge due to max benefits received from outpatient occupational therapy / hand therapy at this time.   Benito Mccreedy, OTR/L, CHT 01/22/23

## 2023-01-22 ENCOUNTER — Ambulatory Visit: Payer: Medicare Other | Attending: Physician Assistant | Admitting: Rehabilitative and Restorative Service Providers"

## 2023-01-22 ENCOUNTER — Encounter: Payer: Self-pay | Admitting: Rehabilitative and Restorative Service Providers"

## 2023-01-22 DIAGNOSIS — M25631 Stiffness of right wrist, not elsewhere classified: Secondary | ICD-10-CM | POA: Diagnosis not present

## 2023-01-22 DIAGNOSIS — M25641 Stiffness of right hand, not elsewhere classified: Secondary | ICD-10-CM | POA: Insufficient documentation

## 2023-01-22 DIAGNOSIS — M79641 Pain in right hand: Secondary | ICD-10-CM | POA: Diagnosis not present

## 2023-01-22 DIAGNOSIS — M6281 Muscle weakness (generalized): Secondary | ICD-10-CM | POA: Insufficient documentation

## 2023-01-22 DIAGNOSIS — R278 Other lack of coordination: Secondary | ICD-10-CM | POA: Insufficient documentation

## 2023-01-22 DIAGNOSIS — R202 Paresthesia of skin: Secondary | ICD-10-CM | POA: Diagnosis not present

## 2023-01-23 ENCOUNTER — Encounter (HOSPITAL_BASED_OUTPATIENT_CLINIC_OR_DEPARTMENT_OTHER): Payer: Self-pay | Admitting: Pulmonary Disease

## 2023-01-23 ENCOUNTER — Other Ambulatory Visit: Payer: Self-pay | Admitting: Internal Medicine

## 2023-01-23 MED ORDER — ALBUTEROL SULFATE (2.5 MG/3ML) 0.083% IN NEBU: 2.5000 mg | INHALATION_SOLUTION | Freq: Four times a day (QID) | RESPIRATORY_TRACT | 11 refills | Status: DC | PRN

## 2023-02-08 DIAGNOSIS — L578 Other skin changes due to chronic exposure to nonionizing radiation: Secondary | ICD-10-CM | POA: Diagnosis not present

## 2023-02-08 DIAGNOSIS — Z85820 Personal history of malignant melanoma of skin: Secondary | ICD-10-CM | POA: Diagnosis not present

## 2023-02-08 DIAGNOSIS — L821 Other seborrheic keratosis: Secondary | ICD-10-CM | POA: Diagnosis not present

## 2023-02-08 DIAGNOSIS — D485 Neoplasm of uncertain behavior of skin: Secondary | ICD-10-CM | POA: Diagnosis not present

## 2023-02-08 DIAGNOSIS — D2372 Other benign neoplasm of skin of left lower limb, including hip: Secondary | ICD-10-CM | POA: Diagnosis not present

## 2023-02-08 DIAGNOSIS — L570 Actinic keratosis: Secondary | ICD-10-CM | POA: Diagnosis not present

## 2023-02-08 DIAGNOSIS — Z85828 Personal history of other malignant neoplasm of skin: Secondary | ICD-10-CM | POA: Diagnosis not present

## 2023-02-08 DIAGNOSIS — L720 Epidermal cyst: Secondary | ICD-10-CM | POA: Diagnosis not present

## 2023-02-13 DIAGNOSIS — I2510 Atherosclerotic heart disease of native coronary artery without angina pectoris: Secondary | ICD-10-CM | POA: Diagnosis not present

## 2023-02-13 DIAGNOSIS — I471 Supraventricular tachycardia, unspecified: Secondary | ICD-10-CM | POA: Diagnosis not present

## 2023-02-13 DIAGNOSIS — E7800 Pure hypercholesterolemia, unspecified: Secondary | ICD-10-CM | POA: Diagnosis not present

## 2023-02-13 DIAGNOSIS — I10 Essential (primary) hypertension: Secondary | ICD-10-CM | POA: Diagnosis not present

## 2023-03-15 ENCOUNTER — Telehealth: Payer: Self-pay | Admitting: *Deleted

## 2023-03-15 ENCOUNTER — Encounter: Payer: Medicare Other | Admitting: *Deleted

## 2023-03-15 MED ORDER — DENOSUMAB 60 MG/ML ~~LOC~~ SOSY: 60.0000 mg | PREFILLED_SYRINGE | Freq: Once | SUBCUTANEOUS | Status: DC

## 2023-03-15 NOTE — Telephone Encounter (Signed)
I have a patient in the office that is here for her prolia injection. I dont see any documentation of confirmation for benefits or insurance approval. This patient has been getting these shots for some time now. Please advise if she needs a authorization

## 2023-03-16 NOTE — Progress Notes (Signed)
This encounter was created in error - please disregard.

## 2023-03-20 ENCOUNTER — Encounter: Payer: Self-pay | Admitting: Internal Medicine

## 2023-03-20 ENCOUNTER — Telehealth: Payer: Self-pay

## 2023-03-20 NOTE — Telephone Encounter (Signed)
Pt ready for scheduling for Prolia on or after : 03/20/23  Out-of-pocket cost due at time of visit: $0  Primary: Medicare Prolia co-insurance: 0% Admin fee co-insurance: 0%  Secondary: [employer] Medsupp Prolia co-insurance:  Admin fee co-insurance:   Medical Benefit Details: Date Benefits were checked: 03/20/23 Deductible: $240 met of $240 required/ Coinsurance: 0%/ Admin Fee: 0%  Prior Auth: N/A PA# Expiration Date:    Pharmacy benefit: Copay $--- If patient wants fill through the pharmacy benefit please send prescription to:  --- , and include estimated need by date in rx notes. Pharmacy will ship medication directly to the office.  Patient NOT eligible for Prolia Copay Card. Copay Card can make patient's cost as little as $25. Link to apply: [email or web address]://www.amgensupportplus.[email or web address]/copay  ** This summary of benefits is an estimation of the patient's out-of-pocket cost. Exact cost may very based on individual plan coverage.

## 2023-03-22 ENCOUNTER — Ambulatory Visit (INDEPENDENT_AMBULATORY_CARE_PROVIDER_SITE_OTHER): Payer: Medicare Other | Admitting: *Deleted

## 2023-03-22 DIAGNOSIS — M810 Age-related osteoporosis without current pathological fracture: Secondary | ICD-10-CM

## 2023-03-22 MED ORDER — DENOSUMAB 60 MG/ML ~~LOC~~ SOSY
60.0000 mg | PREFILLED_SYRINGE | Freq: Once | SUBCUTANEOUS | Status: AC
  Administered 2023-03-22: 60 mg via SUBCUTANEOUS

## 2023-03-22 NOTE — Progress Notes (Addendum)
Patient here for her Prolia injection. Given in her left arm subcutaneous. Patient tolerated well.

## 2023-04-05 NOTE — Telephone Encounter (Signed)
Please see telephone encounter on 03/20/23

## 2023-04-10 DIAGNOSIS — H6123 Impacted cerumen, bilateral: Secondary | ICD-10-CM | POA: Diagnosis not present

## 2023-04-10 DIAGNOSIS — J383 Other diseases of vocal cords: Secondary | ICD-10-CM | POA: Diagnosis not present

## 2023-04-10 DIAGNOSIS — H903 Sensorineural hearing loss, bilateral: Secondary | ICD-10-CM | POA: Diagnosis not present

## 2023-05-10 ENCOUNTER — Ambulatory Visit (INDEPENDENT_AMBULATORY_CARE_PROVIDER_SITE_OTHER): Payer: Medicare Other | Admitting: Pulmonary Disease

## 2023-05-10 ENCOUNTER — Encounter (HOSPITAL_BASED_OUTPATIENT_CLINIC_OR_DEPARTMENT_OTHER): Payer: Self-pay | Admitting: Pulmonary Disease

## 2023-05-10 VITALS — BP 120/64 | HR 65 | Temp 98.2°F | Ht 65.5 in | Wt 113.4 lb

## 2023-05-10 DIAGNOSIS — A310 Pulmonary mycobacterial infection: Secondary | ICD-10-CM | POA: Diagnosis not present

## 2023-05-10 DIAGNOSIS — J479 Bronchiectasis, uncomplicated: Secondary | ICD-10-CM | POA: Diagnosis not present

## 2023-05-10 MED ORDER — IPRATROPIUM-ALBUTEROL 0.5-2.5 (3) MG/3ML IN SOLN: 3.0000 mL | Freq: Every day | RESPIRATORY_TRACT | 2 refills | Status: DC | PRN

## 2023-05-10 NOTE — Assessment & Plan Note (Addendum)
Continue airway clearance measures with saline nebs, vest and flutter valve She has not had superimposed infections Changed from albuterol to albuterol/Atrovent combination.  She has difficulty tolerating bronchodilators due to tremors and palpitations

## 2023-05-10 NOTE — Assessment & Plan Note (Signed)
She has been treated 3 times already and has a relapse.  Decided to hold off on treatment unless there is definite disease progression. Will monitor her with high-resolution CT chest and PFTs in 3 months

## 2023-05-10 NOTE — Progress Notes (Signed)
Subjective:    Patient ID: Diane Haney, female    DOB: 1946-09-11, 77 y.o.   MRN: [id number]  HPI  77 yo ex-smoker with bronchiectasis and MAI  She smoked about 20 pack years before she quit in her early 36s She has been treated 3 times for MAC.  Last treatment was  2014 -2016.  MAI was redemonstrated in sputum in 2022.   5/ 2005 Bronchoscopy >> MAI (S- clarithro, eth, RIF 8.0, synergy positive for E/R) and fortuitum (S- cipro/smikacin/tigecycline).She began antibiotics May 2005 and was treated with ETH/Azithro and cipro until March 2007. treated for both Mycobacterium avium and M. fortuitum.then she had recurrence of symptoms and was positive for Mycobacterium avium again and treated for M avium with azithromycin rifampin and ethambutol. Finished last course (18mon) in Feb 2016 .   PMH -  stage I moderately differentiated invasive ductal carcinoma of the right breast s/p RT.  Idiopathic LLE DVT (Dx at age 77)    Meds -drop in lung function 05/2020, did not tolerate anoro  spiriva 02/2021 did not help, now on incruse  Chief Complaint  Patient presents with   Follow-up    Follow up. Patient stated she is coughing more and she's had one incident when she had coughed up blood a couple months ago. Patient also stated she is still short of breath going up stairs.    78-month follow-up visit. She went to [facility] mycobacterial clinic.  They decided to wait on antibiotics and monitor for disease progression. She still complains of fatigue and shortness of breath while getting dressed or walking up stairs or incline talking or bending over. She continues to have a chronic cough which is nonproductive.  She reports 1 episode of blood-tinged sputum 2 months ago, occasional wheezing she has been unable to tolerate different bronchodilators in the past and uses half a vial of albuterol daily. She reports 45% sensorineural hearing loss in both ears and is awaiting hearing aids  Reviewed consultation  with ENT and [facility] mycobacterial clinic   Significant tests/ events reviewed     10/2022 HRCT chest >> stable bronchiectasis and tree-in-bud opacities 12/2021 HRCT >> previous groundglass in the upper lobes has resolved, unchanged bronchiectasis of right apex, right middle lobe and lingula.  Clustered tree-in-bud nodularity in bilateral lung bases new from prior     11/2020 HRCt >> Patchy bilateral bronchiectasis, volume loss, peribronchovascular nodularity and scattered mucoid impaction, mildly progressive from 11/24/2019   11/24/2019-CT chest without contrast-chronic changes of MAC,  stable mild emphysematous changes, areas of bronchiectasis and chronic atelectasis, larger areas of nodularity in the right lung seen on the prior study have resolved and there are new areas of vague nodularity.   HRCT Chest 08/2018 >> stable compared to 2018   CT 01/2015>Interval slight progression in right apical consolidation with air bronchograms, probably progression of radiation fibrosis. The left apical component is unchanged.   - stable chronic lung disease with bronchiectasis,scarring and peribronchial nodularity    05/2022 spirometry moderate airway obstruction, ratio 65, FEV1 66%, FVC 76%, DLCO 71%/14.04    05/2020 PFTs  mild restriction with mild diffusion defect/ FEV1 1.81 (77%), ratio 73, DLCO 15.53 (74%) >> drop in FEV1 from 2.16-1.81 and drop in FVC from 2.71-2.48    09/2017  PFT-stable with FEV1 94% , ratio 76 , FVC 94%   2011 spirometry normal  Review of Systems neg for any significant sore throat, dysphagia, itching, sneezing, nasal congestion or excess/ purulent secretions, fever, chills,  sweats, unintended wt loss, pleuritic or exertional cp, hempoptysis, orthopnea pnd or change in chronic leg swelling. Also denies presyncope, palpitations, heartburn, abdominal pain, nausea, vomiting, diarrhea or change in bowel or urinary habits, dysuria,hematuria, rash, arthralgias, visual complaints,  headache, numbness weakness or ataxia.     Objective:   Physical Exam  Gen. Pleasant, well-nourished, in no distress ENT - no thrush, no pallor/icterus,no post nasal drip Neck: No JVD, no thyromegaly, no carotid bruits Lungs: no use of accessory muscles, no dullness to percussion, scattered crackles Cardiovascular: Rhythm regular, heart sounds  normal, no murmurs or gallops, no peripheral edema Musculoskeletal: No deformities, no cyanosis or clubbing        Assessment & Plan:

## 2023-05-10 NOTE — Patient Instructions (Addendum)
x schedule PFTs and high-resolution CT chest in 3-4 months  X change from albuterol nebs to albuterol plus ipratropium nebs twice daily as needed#30 x 2 refills

## 2023-05-13 ENCOUNTER — Encounter (HOSPITAL_BASED_OUTPATIENT_CLINIC_OR_DEPARTMENT_OTHER): Payer: Self-pay | Admitting: Pulmonary Disease

## 2023-05-15 NOTE — Telephone Encounter (Signed)
Mychart message sent by pt:  Diane Haney  P [facility] (supporting Oretha Milch, MD)2 days ago    My insurance doesn't cover the new prescription, Ipratropium,  and it is over $200 without insurance. I will continue using the Albuterol solution in my nebulizer. [name]    Routing to Dr. Vassie Loll for review.

## 2023-06-19 ENCOUNTER — Encounter: Payer: Self-pay | Admitting: Internal Medicine

## 2023-06-19 ENCOUNTER — Ambulatory Visit (INDEPENDENT_AMBULATORY_CARE_PROVIDER_SITE_OTHER): Payer: Medicare Other | Admitting: Internal Medicine

## 2023-06-19 VITALS — BP 124/78 | HR 62 | Temp 98.5°F | Ht 65.5 in | Wt 114.0 lb

## 2023-06-19 DIAGNOSIS — R7309 Other abnormal glucose: Secondary | ICD-10-CM | POA: Diagnosis not present

## 2023-06-19 DIAGNOSIS — M545 Low back pain, unspecified: Secondary | ICD-10-CM

## 2023-06-19 DIAGNOSIS — R55 Syncope and collapse: Secondary | ICD-10-CM

## 2023-06-19 DIAGNOSIS — E7849 Other hyperlipidemia: Secondary | ICD-10-CM | POA: Diagnosis not present

## 2023-06-19 DIAGNOSIS — I10 Essential (primary) hypertension: Secondary | ICD-10-CM | POA: Diagnosis not present

## 2023-06-19 DIAGNOSIS — G8929 Other chronic pain: Secondary | ICD-10-CM | POA: Diagnosis not present

## 2023-06-19 LAB — CBC WITH DIFFERENTIAL/PLATELET
Basophils Absolute: 0.1 10*3/uL (ref 0.0–0.1)
Basophils Relative: 0.8 % (ref 0.0–3.0)
Eosinophils Absolute: 0.1 10*3/uL (ref 0.0–0.7)
Eosinophils Relative: 1.1 % (ref 0.0–5.0)
HCT: 43.6 % (ref 36.0–46.0)
Hemoglobin: 14.2 g/dL (ref 12.0–15.0)
Lymphocytes Relative: 15.3 % (ref 12.0–46.0)
Lymphs Abs: 1.1 10*3/uL (ref 0.7–4.0)
MCHC: 32.6 g/dL (ref 30.0–36.0)
MCV: 97 fl (ref 78.0–100.0)
Monocytes Absolute: 0.8 10*3/uL (ref 0.1–1.0)
Monocytes Relative: 11.3 % (ref 3.0–12.0)
Neutro Abs: 5.3 10*3/uL (ref 1.4–7.7)
Neutrophils Relative %: 71.5 % (ref 43.0–77.0)
Platelets: 247 10*3/uL (ref 150.0–400.0)
RBC: 4.5 Mil/uL (ref 3.87–5.11)
RDW: 14.7 % (ref 11.5–15.5)
WBC: 7.4 10*3/uL (ref 4.0–10.5)

## 2023-06-19 LAB — URINALYSIS, ROUTINE W REFLEX MICROSCOPIC
Bilirubin Urine: NEGATIVE
Hgb urine dipstick: NEGATIVE
Ketones, ur: NEGATIVE
Leukocytes,Ua: NEGATIVE
Nitrite: NEGATIVE
Specific Gravity, Urine: 1.015 (ref 1.000–1.030)
Total Protein, Urine: NEGATIVE
Urine Glucose: NEGATIVE
Urobilinogen, UA: 0.2 (ref 0.0–1.0)
pH: 7 (ref 5.0–8.0)

## 2023-06-19 LAB — BASIC METABOLIC PANEL
BUN: 19 mg/dL (ref 6–23)
CO2: 30 mEq/L (ref 19–32)
Calcium: 10.7 mg/dL — ABNORMAL HIGH (ref 8.4–10.5)
Chloride: 101 mEq/L (ref 96–112)
Creatinine, Ser: 0.98 mg/dL (ref 0.40–1.20)
GFR: 55.59 mL/min — ABNORMAL LOW (ref >60.00)
Glucose, Bld: 96 mg/dL (ref 70–99)
Potassium: 5.1 mEq/L (ref 3.5–5.1)
Sodium: 138 mEq/L (ref 135–145)

## 2023-06-19 LAB — HEPATIC FUNCTION PANEL
ALT: 23 U/L (ref 0–35)
AST: 28 U/L (ref 0–37)
Albumin: 4.3 g/dL (ref 3.5–5.2)
Alkaline Phosphatase: 39 U/L (ref 39–117)
Bilirubin, Direct: 0.1 mg/dL (ref 0.0–0.3)
Total Bilirubin: 0.5 mg/dL (ref 0.2–1.2)
Total Protein: 7.8 g/dL (ref 6.0–8.3)

## 2023-06-19 LAB — TSH: TSH: 5.02 u[IU]/mL (ref 0.35–5.50)

## 2023-06-19 LAB — LIPID PANEL
Cholesterol: 168 mg/dL (ref 0–200)
HDL: 70.7 mg/dL (ref >39.00)
LDL Cholesterol: 86 mg/dL (ref 0–99)
NonHDL: 97.43
Total CHOL/HDL Ratio: 2
Triglycerides: 57 mg/dL (ref 0.0–149.0)
VLDL: 11.4 mg/dL (ref 0.0–40.0)

## 2023-06-19 LAB — HEMOGLOBIN A1C: Hgb A1c MFr Bld: 6.1 % (ref 4.6–6.5)

## 2023-06-19 MED ORDER — TRAMADOL HCL 50 MG PO TABS: 50.0000 mg | ORAL_TABLET | Freq: Four times a day (QID) | ORAL | 0 refills | Status: DC | PRN

## 2023-06-19 MED ORDER — TEMAZEPAM 15 MG PO CAPS: 15.0000 mg | ORAL_CAPSULE | Freq: Every day | ORAL | 1 refills | Status: DC

## 2023-06-19 NOTE — Progress Notes (Signed)
The test results show that your current treatment is OK, as the tests are stable.  Please continue the same plan.  There is no other need for change of treatment or further evaluation based on these results, at this time.  thanks 

## 2023-06-19 NOTE — Assessment & Plan Note (Signed)
Lab Results  Component Value Date   HGBA1C 6.3 12/21/2022   Stable, pt to continue current medical treatment  - diet, wt control

## 2023-06-19 NOTE — Assessment & Plan Note (Signed)
Lab Results  Component Value Date   LDLCALC 75 12/21/2022   Uncontrolled, goal ldl < 70,, pt to cont lipitor 20 mg every day, declines increase for now

## 2023-06-19 NOTE — Assessment & Plan Note (Signed)
BP Readings from Last 3 Encounters:  06/19/23 124/78  05/10/23 120/64  12/21/22 120/62   Stable, pt to continue medical treatment norvasc 5 every day, lopressor 12.5 bid, benicar hct 20-12.5 qd

## 2023-06-19 NOTE — Progress Notes (Signed)
Patient ID: Diane Haney, female   DOB: [birth year], 77 y.o.   MRN: [id number]        Chief Complaint: follow up near syncope, chronic lbp, htn, hld       HPI:  Diane Haney is a 77 y.o. female here overall doing ok, Pt denies chest pain, increased sob or doe, wheezing, orthopnea, PND, increased LE swelling, palpitations, or syncope but has had 3 episodes of unexplained dizziness feeling like she might fall out without overheating or other associated symptoms, position.   Pt denies polydipsia, polyuria, or new focal neuro s/s.    Pt denies fever, wt loss, night sweats, loss of appetite, or other constitutional symptoms  Has planned f/u CT chest for sept per pulmonary to r/o worsening pulm disease.  Pt continues to have recurring LBP without change in severity, bowel or bladder change, fever, wt loss,  worsening LE pain/numbness/weakness, gait change or falls.       Wt Readings from Last 3 Encounters:  06/19/23 114 lb (51.7 kg)  05/10/23 113 lb 6.4 oz (51.4 kg)  12/21/22 113 lb (51.3 kg)   BP Readings from Last 3 Encounters:  06/19/23 124/78  05/10/23 120/64  12/21/22 120/62         Past Medical History:  Diagnosis Date   Breast cancer (HCC) 11/12/2012   Dx oct 2013 - [facility] - Invasive ductal carcinoma, s/o bilat mastectomy with + margin  - also for XRT soon   Bronchiectasis    Chronic fatigue fibromyalgia syndrome 11/12/2012   COPD (chronic obstructive pulmonary disease) (HCC)    DVT, lower extremity (HCC)    LLE   Fibromyalgia    GERD (gastroesophageal reflux disease)    History of shingles    Hyperlipidemia    Hypertension    Impaired glucose tolerance 11/14/2013   Migraine    Osteopenia    Osteoporosis 10/16/2007   Qualifier: Diagnosis of  By: Jonny Ruiz MD, Len Blalock    Pulmonary Mycobacterium avium complex (MAC) infection (HCC)    Squamous cell skin cancer, multiple sites    Vaccine counseling 01/18/2022   Past Surgical History:  Procedure Laterality Date    BRONCHIAL WASHINGS  10/17/2022   Procedure: BRONCHIAL WASHINGS;  Surgeon: Oretha Milch, MD;  Location: [facility];  Service: Cardiopulmonary;;   FOOT SURGERY     mastectomy bilateral oct 2013     shoulder impingement     VIDEO BRONCHOSCOPY N/A 05/07/2014   Procedure: VIDEO BRONCHOSCOPY WITHOUT FLUORO;  Surgeon: Storm Frisk, MD;  Location: [facility];  Service: Cardiopulmonary;  Laterality: N/A;   VIDEO BRONCHOSCOPY N/A 10/17/2022   Procedure: VIDEO BRONCHOSCOPY WITHOUT FLUORO;  Surgeon: Oretha Milch, MD;  Location: [facility];  Service: Cardiopulmonary;  Laterality: N/A;    reports that she quit smoking about 37 years ago. Her smoking use included cigarettes. She has a 25.00 pack-year smoking history. She has never used smokeless tobacco. She reports that she does not drink alcohol and does not use drugs. family history includes Breast cancer in an other family member; Colon polyps in her father; Heart disease in an other family member; Hyperlipidemia in an other family member; Hypertension in an other family member; Lung cancer in her father; Other in an other family member. Allergies  Allergen Reactions   Meloxicam Anaphylaxis    Increased blood pressure    Losartan     headache   Adhesive [Tape]     Blisters   Ciprofloxacin  REACTION: tendinitis   Clarithromycin     REACTION: severe reflux   Prednisone     REACTION: irregular heartbeat, not able to sleep   Current Outpatient Medications on File Prior to Visit  Medication Sig Dispense Refill   albuterol (PROVENTIL) (2.5 MG/3ML) 0.083% nebulizer solution Take 3 mLs (2.5 mg total) by nebulization every 6 (six) hours as needed for wheezing or shortness of breath. 360 mL 11   amLODipine (NORVASC) 5 MG tablet Take 1 tablet (5 mg total) by mouth daily. 90 tablet 3   aspirin 81 MG EC tablet Take 81 mg by mouth daily.     atorvastatin (LIPITOR) 20 MG tablet Take 1 tablet (20 mg total) by mouth daily. 90 tablet 3   calcium  citrate-vitamin D (CITRACAL+D) 315-200 MG-UNIT per tablet Take 2 tablets by mouth daily.      carboxymethylcellulose (REFRESH PLUS) 0.5 % SOLN Place 1 drop into both eyes 2 (two) times daily as needed (dry eyes).     carisoprodol (SOMA) 350 MG tablet Take 1 tablet (350 mg total) by mouth 2 (two) times daily as needed for muscle spasms. 180 tablet 1   gabapentin (NEURONTIN) 100 MG capsule Take 1 capsule (100 mg total) by mouth 3 (three) times daily. 270 capsule 1   ipratropium-albuterol (DUONEB) 0.5-2.5 (3) MG/3ML SOLN Take 3 mLs by nebulization daily as needed. 360 mL 2   metoprolol tartrate (LOPRESSOR) 25 MG tablet TAKE 1/2 TABLET(12.5 MG) BY MOUTH TWICE DAILY 90 tablet 3   olmesartan-hydrochlorothiazide (BENICAR HCT) 20-12.5 MG tablet TAKE 1/2 TABLET BY MOUTH DAILY 45 tablet 3   promethazine-dextromethorphan (PROMETHAZINE-DM) 6.25-15 MG/5ML syrup TAKE 5 ML BY MOUTH FOUR TIMES DAILY AS NEEDED FOR COUGH (Patient taking differently: Take 5 mLs by mouth 4 (four) times daily as needed for cough.) 473 mL 0   Respiratory Therapy Supplies (FLUTTER) DEVI Use as directed. (Patient taking differently: Use as directed.  Waymon Budge.  Has a smart vest as well.) 1 each 0   Sodium Chloride, Inhalant, 7 % NEBU Inhale 3 mLs into the lungs 2 (two) times daily.     No current facility-administered medications on file prior to visit.        ROS:  All others reviewed and negative.  Objective        PE:  BP 124/78 (BP Location: Left Arm, Patient Position: Sitting, Cuff Size: Normal)   Pulse 62   Temp 98.5 F (36.9 C) (Oral)   Ht 5' 5.5" (1.664 m)   Wt 114 lb (51.7 kg)   SpO2 91%   BMI 18.68 kg/m                 Constitutional: Pt appears in NAD               HENT: Head: NCAT.                Right Ear: External ear normal.                 Left Ear: External ear normal.                Eyes: . Pupils are equal, round, and reactive to light. Conjunctivae and EOM are normal               Nose: without d/c or  deformity               Neck: Neck supple. Gross normal ROM  Cardiovascular: Normal rate and regular rhythm.                 Pulmonary/Chest: Effort normal and breath sounds without rales or wheezing.                Abd:  Soft, NT, ND, + BS, no organomegaly               Neurological: Pt is alert. At baseline orientation, motor grossly intact               Skin: Skin is warm. No rashes, no other new lesions, LE edema - none               Psychiatric: Pt behavior is normal without agitation   Micro: none  Cardiac tracings I have personally interpreted today:  none  Pertinent Radiological findings (summarize): none   Lab Results  Component Value Date   WBC 7.5 12/21/2022   HGB 14.8 12/21/2022   HCT 43.5 12/21/2022   PLT 242.0 12/21/2022   GLUCOSE 93 12/21/2022   CHOL 153 12/21/2022   TRIG 62.0 12/21/2022   HDL 65.70 12/21/2022   LDLDIRECT 124.4 11/14/2013   LDLCALC 75 12/21/2022   ALT 15 12/21/2022   AST 23 12/21/2022   NA 140 12/21/2022   K 3.6 12/21/2022   CL 101 12/21/2022   CREATININE 0.86 12/21/2022   BUN 15 12/21/2022   CO2 30 12/21/2022   TSH 4.88 12/21/2022   INR 0.9 02/15/2021   HGBA1C 6.3 12/21/2022   Assessment/Plan:  Diane Haney is a 77 y.o. White or Caucasian [1] female with  has a past medical history of Breast cancer (HCC) (11/12/2012), Bronchiectasis, Chronic fatigue fibromyalgia syndrome (11/12/2012), COPD (chronic obstructive pulmonary disease) (HCC), DVT, lower extremity (HCC), Fibromyalgia, GERD (gastroesophageal reflux disease), History of shingles, Hyperlipidemia, Hypertension, Impaired glucose tolerance (11/14/2013), Migraine, Osteopenia, Osteoporosis (10/16/2007), Pulmonary Mycobacterium avium complex (MAC) infection (HCC), Squamous cell skin cancer, multiple sites, and Vaccine counseling (01/18/2022).  Chronic low back pain Mild to mod worsening chronic persistent, no neuro changes, for tramadol prn,  to f/u any worsening symptoms or  concerns   Essential hypertension BP Readings from Last 3 Encounters:  06/19/23 124/78  05/10/23 120/64  12/21/22 120/62   Stable, pt to continue medical treatment norvasc 5 every day, lopressor 12.5 bid, benicar hct 20-12.5 qd   Hyperlipidemia Lab Results  Component Value Date   LDLCALC 75 12/21/2022   Uncontrolled, goal ldl < 70,, pt to cont lipitor 20 mg every day, declines increase for now   Elevated glucose Lab Results  Component Value Date   HGBA1C 6.3 12/21/2022   Stable, pt to continue current medical treatment  - diet, wt control   Near syncope Etiology unclear, for lab today includinb cbc, volume appears stable, no other assoc symptoms such as cough, declines echo, cardiac event monitor, or carotid testing for now,  to f/u any worsening symptoms or concerns  Followup: Return in about 6 months (around 12/20/2023).  Oliver Barre, MD 06/19/2023 1:03 PM [facility]

## 2023-06-19 NOTE — Assessment & Plan Note (Addendum)
Etiology unclear, for lab today includinb cbc, volume appears stable, no other assoc symptoms such as cough, declines echo, cardiac event monitor, or carotid testing for now,  to f/u any worsening symptoms or concerns

## 2023-06-19 NOTE — Patient Instructions (Addendum)
Please take all new medication as prescribed - the tramadol for pain as needed  Please continue all other medications as before, and refills have been done if requested - the temazepam  Your handicapped applications were signed today  Please have the pharmacy call with any other refills you may need.  Please continue your efforts at being more active, low cholesterol diet, and weight control  Please keep your appointments with your specialists as you may have planned  Please go to the LAB at the blood drawing area for the tests to be done  You will be contacted by phone if any changes need to be made immediately.  Otherwise, you will receive a letter about your results with an explanation, but please check with MyChart first.  Please remember to sign up for MyChart if you have not done so, as this will be important to you in the future with finding out test results, communicating by private email, and scheduling acute appointments online when needed.  Please make an Appointment to return in 6 months, or sooner if needed

## 2023-06-19 NOTE — Assessment & Plan Note (Signed)
Mild to mod worsening chronic persistent, no neuro changes, for tramadol prn,  to f/u any worsening symptoms or concerns

## 2023-07-13 ENCOUNTER — Encounter: Payer: Self-pay | Admitting: Internal Medicine

## 2023-07-13 MED ORDER — GABAPENTIN 100 MG PO CAPS: 100.0000 mg | ORAL_CAPSULE | Freq: Three times a day (TID) | ORAL | 1 refills | Status: DC

## 2023-07-26 DIAGNOSIS — C44722 Squamous cell carcinoma of skin of right lower limb, including hip: Secondary | ICD-10-CM | POA: Diagnosis not present

## 2023-07-26 DIAGNOSIS — D485 Neoplasm of uncertain behavior of skin: Secondary | ICD-10-CM | POA: Diagnosis not present

## 2023-07-31 ENCOUNTER — Other Ambulatory Visit: Payer: Self-pay | Admitting: Internal Medicine

## 2023-07-31 ENCOUNTER — Encounter: Payer: Self-pay | Admitting: Internal Medicine

## 2023-07-31 ENCOUNTER — Ambulatory Visit (INDEPENDENT_AMBULATORY_CARE_PROVIDER_SITE_OTHER): Admission: RE | Admit: 2023-07-31 | Payer: Medicare Other | Source: Ambulatory Visit

## 2023-07-31 DIAGNOSIS — E2839 Other primary ovarian failure: Secondary | ICD-10-CM

## 2023-07-31 MED ORDER — ALENDRONATE SODIUM 70 MG PO TABS
70.0000 mg | ORAL_TABLET | ORAL | 3 refills | Status: DC
Start: 2023-07-31 — End: 2023-08-01

## 2023-08-01 ENCOUNTER — Other Ambulatory Visit: Payer: Self-pay | Admitting: Internal Medicine

## 2023-08-01 MED ORDER — DENOSUMAB 60 MG/ML ~~LOC~~ SOSY: 60.0000 mg | PREFILLED_SYRINGE | SUBCUTANEOUS | 11 refills | Status: AC

## 2023-08-07 ENCOUNTER — Ambulatory Visit (HOSPITAL_BASED_OUTPATIENT_CLINIC_OR_DEPARTMENT_OTHER)
Admission: RE | Admit: 2023-08-07 | Discharge: 2023-08-07 | Disposition: A | Discharge status: Home / Self Care | Payer: Medicare Other | Source: Ambulatory Visit | Attending: Pulmonary Disease | Admitting: Pulmonary Disease

## 2023-08-07 DIAGNOSIS — A310 Pulmonary mycobacterial infection: Secondary | ICD-10-CM | POA: Insufficient documentation

## 2023-08-07 DIAGNOSIS — D3502 Benign neoplasm of left adrenal gland: Secondary | ICD-10-CM | POA: Diagnosis not present

## 2023-08-07 DIAGNOSIS — J984 Other disorders of lung: Secondary | ICD-10-CM | POA: Diagnosis not present

## 2023-08-07 DIAGNOSIS — J479 Bronchiectasis, uncomplicated: Secondary | ICD-10-CM | POA: Diagnosis not present

## 2023-08-13 ENCOUNTER — Encounter (HOSPITAL_BASED_OUTPATIENT_CLINIC_OR_DEPARTMENT_OTHER): Payer: Self-pay | Admitting: Pulmonary Disease

## 2023-08-14 DIAGNOSIS — Z85820 Personal history of malignant melanoma of skin: Secondary | ICD-10-CM | POA: Diagnosis not present

## 2023-08-14 DIAGNOSIS — L821 Other seborrheic keratosis: Secondary | ICD-10-CM | POA: Diagnosis not present

## 2023-08-14 DIAGNOSIS — Z859 Personal history of malignant neoplasm, unspecified: Secondary | ICD-10-CM | POA: Diagnosis not present

## 2023-08-14 DIAGNOSIS — L570 Actinic keratosis: Secondary | ICD-10-CM | POA: Diagnosis not present

## 2023-08-14 DIAGNOSIS — Z85828 Personal history of other malignant neoplasm of skin: Secondary | ICD-10-CM | POA: Diagnosis not present

## 2023-08-20 ENCOUNTER — Ambulatory Visit (HOSPITAL_BASED_OUTPATIENT_CLINIC_OR_DEPARTMENT_OTHER): Payer: Medicare Other | Admitting: Pulmonary Disease

## 2023-08-20 ENCOUNTER — Encounter (HOSPITAL_BASED_OUTPATIENT_CLINIC_OR_DEPARTMENT_OTHER): Payer: Self-pay | Admitting: Pulmonary Disease

## 2023-08-20 VITALS — BP 110/68 | HR 78 | Resp 16 | Ht 65.5 in | Wt 113.6 lb

## 2023-08-20 DIAGNOSIS — A310 Pulmonary mycobacterial infection: Secondary | ICD-10-CM

## 2023-08-20 DIAGNOSIS — J479 Bronchiectasis, uncomplicated: Secondary | ICD-10-CM | POA: Diagnosis not present

## 2023-08-20 LAB — PULMONARY FUNCTION TEST
DL/VA % pred: 95 %
DL/VA: 3.88 ml/min/mmHg/L
DLCO cor % pred: 62 %
DLCO cor: 12.41 ml/min/mmHg
DLCO unc % pred: 62 %
DLCO unc: 12.41 ml/min/mmHg
FEF 25-75 Post: 0.88 L/s
FEF 25-75 Pre: 0.68 L/s
FEF2575-%Change-Post: 29 %
FEF2575-%Pred-Post: 53 %
FEF2575-%Pred-Pre: 41 %
FEV1-%Change-Post: 5 %
FEV1-%Pred-Post: 66 %
FEV1-%Pred-Pre: 63 %
FEV1-Post: 1.46 L
FEV1-Pre: 1.38 L
FEV1FVC-%Change-Post: 9 %
FEV1FVC-%Pred-Pre: 86 %
FEV6-%Change-Post: -2 %
FEV6-%Pred-Post: 74 %
FEV6-%Pred-Pre: 76 %
FEV6-Post: 2.06 L
FEV6-Pre: 2.12 L
FEV6FVC-%Change-Post: 0 %
FEV6FVC-%Pred-Post: 105 %
FEV6FVC-%Pred-Pre: 104 %
FVC-%Change-Post: -3 %
FVC-%Pred-Post: 70 %
FVC-%Pred-Pre: 73 %
FVC-Post: 2.06 L
FVC-Pre: 2.14 L
Post FEV1/FVC ratio: 71 %
Post FEV6/FVC ratio: 100 %
Pre FEV1/FVC ratio: 65 %
Pre FEV6/FVC Ratio: 99 %
RV % pred: 133 %
RV: 3.21 L
TLC % pred: 99 %
TLC: 5.26 L

## 2023-08-20 NOTE — Assessment & Plan Note (Signed)
Lung function-FEV1 has dropped from 77% in 2021 to 66% in 2023 and 63% now.  FVC and TLC are maintained, DLCO has also dropped from 74% to 62% now. HRCT shows waxing and waning nodularity, bronchiectasis appears stable.  Unfortunately she has not been able to tolerate bronchodilator therapy due to tremors and palpitation.  She is doing as much as she can with airway clearance measures.  She is not able to tolerate a vest but does use flutter valve and hypertonic saline. I wonder  PDE/3/4 inhibitor might work for her better and not cause as many side effects.

## 2023-08-20 NOTE — Progress Notes (Signed)
Subjective:    Patient ID: Diane Haney, female    DOB: 12-28-1945, 77 y.o.   MRN: [id number]  HPI  77 yo ex-smoker with bronchiectasis and MAI  She smoked about 20 pack years before she quit in her early 73s She has been treated 3 times for MAC.  Last treatment was  2014 -2016 (azithromycin rifampin and ethambutol.)  MAI was redemonstrated in sputum in 2022.   5/ 2005 Bronchoscopy >> MAI (S- clarithro, eth, RIF 8.0, synergy positive for E/R) and fortuitum (S- cipro/smikacin/tigecycline).She began antibiotics May 2005 and was treated with ETH/Azithro and cipro until March 2007.    PMH -  stage I moderately differentiated invasive ductal carcinoma of the right breast s/p RT.  Idiopathic LLE DVT (Dx at age 35) sensorineural hearing loss in both ears     Meds -drop in lung function 05/2020, did not tolerate anoro- due to tremors and palpitations   spiriva 02/2021 did not help, now on incruse    She went to [facility] mycobacterial clinic  We repeated high-resolution CT chest and PFTs.  She does report slightly increased dyspnea. She is compliant with flutter valve and hypertonic saline.  She is unable to use her vest    Significant tests/ events reviewed   07/2023 HRCT chest  Cylindrical bronchiectasis with slight waxing and waning nodularity 10/2022 HRCT chest >> stable bronchiectasis and tree-in-bud opacities 12/2021 HRCT >> previous groundglass in the upper lobes has resolved, unchanged bronchiectasis of right apex, right middle lobe and lingula.  Clustered tree-in-bud nodularity in bilateral lung bases new from prior     11/2020 HRCt >> Patchy bilateral bronchiectasis, volume loss, peribronchovascular nodularity and scattered mucoid impaction, mildly progressive from 11/24/2019   11/24/2019-CT chest without contrast-chronic changes of MAC,  stable mild emphysematous changes, areas of bronchiectasis and chronic atelectasis, larger areas of nodularity in the right lung seen on the prior  study have resolved and there are new areas of vague nodularity.   HRCT Chest 08/2018 >> stable compared to 2018   CT 01/2015>Interval slight progression in right apical consolidation with air bronchograms, probably progression of radiation fibrosis. The left apical component is unchanged.   - stable chronic lung disease with bronchiectasis,scarring and peribronchial nodularity     08/2023 PFTs >> moderate airway obstruction, decreased FEV1 1.46/63%, slight drop in DLCO 12.4/62%  05/2022 spirometry moderate airway obstruction, ratio 65, FEV1 66%, FVC 76%, DLCO 71%/14.04    05/2020 PFTs  mild restriction with mild diffusion defect/ FEV1 1.81 (77%), ratio 73, DLCO 15.53 (74%) >> drop in FEV1 from 2.16-1.81 and drop in FVC from 2.71-2.48    09/2017  PFT-stable with FEV1 94% , ratio 76 , FVC 94%   2011 spirometry normal  Review of Systems neg for any significant sore throat, dysphagia, itching, sneezing, nasal congestion or excess/ purulent secretions, fever, chills, sweats, unintended wt loss, pleuritic or exertional cp, hempoptysis, orthopnea pnd or change in chronic leg swelling. Also denies presyncope, palpitations, heartburn, abdominal pain, nausea, vomiting, diarrhea or change in bowel or urinary habits, dysuria,hematuria, rash, arthralgias, visual complaints, headache, numbness weakness or ataxia.     Objective:   Physical Exam   Gen. Pleasant, thin elderly woman, in no distress ENT - no thrush, no pallor/icterus,no post nasal drip Neck: No JVD, no thyromegaly, no carotid bruits Lungs: no use of accessory muscles, no dullness to percussion, clear without rales or rhonchi  Cardiovascular: Rhythm regular, heart sounds  normal, no murmurs or gallops, no peripheral edema Musculoskeletal:  No deformities, no cyanosis or clubbing         Assessment & Plan:

## 2023-08-20 NOTE — Assessment & Plan Note (Signed)
She will discuss treatment with [facility] mycobacterial clinic given that there has been some drop in lung function

## 2023-08-20 NOTE — Progress Notes (Signed)
Performed Full PFT Today.   ?

## 2023-08-20 NOTE — Patient Instructions (Signed)
There has been a gradual drop in lung function CT scan is stable  Continue airway clearance measures with flutter valve, saline nebs and albuterol nebs  Discussed Ohtuware -new medication for COPD that will not cause your heart rate to be high

## 2023-08-20 NOTE — Patient Instructions (Signed)
Performed Full PFT Today.   ?

## 2023-08-29 ENCOUNTER — Other Ambulatory Visit: Payer: Self-pay | Admitting: Internal Medicine

## 2023-09-05 DIAGNOSIS — G8928 Other chronic postprocedural pain: Secondary | ICD-10-CM | POA: Diagnosis not present

## 2023-09-05 DIAGNOSIS — Z08 Encounter for follow-up examination after completed treatment for malignant neoplasm: Secondary | ICD-10-CM | POA: Diagnosis not present

## 2023-09-05 DIAGNOSIS — Z853 Personal history of malignant neoplasm of breast: Secondary | ICD-10-CM | POA: Diagnosis not present

## 2023-09-14 DIAGNOSIS — Z23 Encounter for immunization: Secondary | ICD-10-CM | POA: Diagnosis not present

## 2023-09-17 DIAGNOSIS — Z23 Encounter for immunization: Secondary | ICD-10-CM | POA: Diagnosis not present

## 2023-09-20 DIAGNOSIS — Z9013 Acquired absence of bilateral breasts and nipples: Secondary | ICD-10-CM | POA: Diagnosis not present

## 2023-09-20 DIAGNOSIS — M546 Pain in thoracic spine: Secondary | ICD-10-CM | POA: Diagnosis not present

## 2023-09-20 DIAGNOSIS — Z853 Personal history of malignant neoplasm of breast: Secondary | ICD-10-CM | POA: Diagnosis not present

## 2023-09-20 DIAGNOSIS — G8928 Other chronic postprocedural pain: Secondary | ICD-10-CM | POA: Diagnosis not present

## 2023-09-20 DIAGNOSIS — R0789 Other chest pain: Secondary | ICD-10-CM | POA: Diagnosis not present

## 2023-09-24 ENCOUNTER — Telehealth: Payer: Self-pay

## 2023-09-24 DIAGNOSIS — C44722 Squamous cell carcinoma of skin of right lower limb, including hip: Secondary | ICD-10-CM | POA: Diagnosis not present

## 2023-09-24 NOTE — Telephone Encounter (Signed)
Patient is cleared for PROLIA injection on 09/27/2023 per PA information on 03/20/2023.  Mentioned in provider note last on 06/19/2023. Medication is CLINIC supplied. CoPay:$0

## 2023-09-27 ENCOUNTER — Ambulatory Visit: Payer: Medicare Other

## 2023-09-27 DIAGNOSIS — M8589 Other specified disorders of bone density and structure, multiple sites: Secondary | ICD-10-CM | POA: Diagnosis not present

## 2023-09-27 MED ORDER — DENOSUMAB 60 MG/ML ~~LOC~~ SOSY
60.0000 mg | PREFILLED_SYRINGE | Freq: Once | SUBCUTANEOUS | Status: AC
Start: 2023-09-27 — End: 2023-09-27
  Administered 2023-09-27: 60 mg via SUBCUTANEOUS

## 2023-09-27 NOTE — Progress Notes (Signed)
After obtaining consent, and per orders of Dr. Jonny Ruiz, injection of Fayrene Fearing given by Ferdie Ping. Patient instructed to report any adverse reaction to me immediately.

## 2023-10-10 DIAGNOSIS — G8928 Other chronic postprocedural pain: Secondary | ICD-10-CM | POA: Diagnosis not present

## 2023-11-15 DIAGNOSIS — Z9013 Acquired absence of bilateral breasts and nipples: Secondary | ICD-10-CM | POA: Diagnosis not present

## 2023-11-15 DIAGNOSIS — M546 Pain in thoracic spine: Secondary | ICD-10-CM | POA: Diagnosis not present

## 2023-11-15 DIAGNOSIS — G8918 Other acute postprocedural pain: Secondary | ICD-10-CM | POA: Diagnosis not present

## 2023-11-15 DIAGNOSIS — G8929 Other chronic pain: Secondary | ICD-10-CM | POA: Diagnosis not present

## 2023-11-21 DIAGNOSIS — H35371 Puckering of macula, right eye: Secondary | ICD-10-CM | POA: Diagnosis not present

## 2023-11-21 DIAGNOSIS — H5203 Hypermetropia, bilateral: Secondary | ICD-10-CM | POA: Diagnosis not present

## 2023-11-21 DIAGNOSIS — H52203 Unspecified astigmatism, bilateral: Secondary | ICD-10-CM | POA: Diagnosis not present

## 2023-11-21 DIAGNOSIS — H04123 Dry eye syndrome of bilateral lacrimal glands: Secondary | ICD-10-CM | POA: Diagnosis not present

## 2023-11-21 DIAGNOSIS — H524 Presbyopia: Secondary | ICD-10-CM | POA: Diagnosis not present

## 2023-11-21 DIAGNOSIS — H02831 Dermatochalasis of right upper eyelid: Secondary | ICD-10-CM | POA: Diagnosis not present

## 2023-11-21 DIAGNOSIS — H43813 Vitreous degeneration, bilateral: Secondary | ICD-10-CM | POA: Diagnosis not present

## 2023-11-21 DIAGNOSIS — Z961 Presence of intraocular lens: Secondary | ICD-10-CM | POA: Diagnosis not present

## 2023-11-21 DIAGNOSIS — H26491 Other secondary cataract, right eye: Secondary | ICD-10-CM | POA: Diagnosis not present

## 2023-11-21 DIAGNOSIS — H02834 Dermatochalasis of left upper eyelid: Secondary | ICD-10-CM | POA: Diagnosis not present

## 2023-11-26 DIAGNOSIS — L905 Scar conditions and fibrosis of skin: Secondary | ICD-10-CM | POA: Diagnosis not present

## 2023-11-28 DIAGNOSIS — M546 Pain in thoracic spine: Secondary | ICD-10-CM | POA: Diagnosis not present

## 2023-11-28 DIAGNOSIS — G8929 Other chronic pain: Secondary | ICD-10-CM | POA: Diagnosis not present

## 2023-11-28 DIAGNOSIS — M5124 Other intervertebral disc displacement, thoracic region: Secondary | ICD-10-CM | POA: Diagnosis not present

## 2023-12-18 ENCOUNTER — Ambulatory Visit: Payer: Medicare Other | Admitting: Internal Medicine

## 2023-12-19 ENCOUNTER — Other Ambulatory Visit: Payer: Self-pay | Admitting: Internal Medicine

## 2023-12-19 ENCOUNTER — Other Ambulatory Visit: Payer: Self-pay

## 2023-12-20 ENCOUNTER — Ambulatory Visit: Payer: Medicare Other | Admitting: Internal Medicine

## 2023-12-25 ENCOUNTER — Ambulatory Visit: Payer: Medicare Other | Admitting: Internal Medicine

## 2024-01-01 ENCOUNTER — Encounter: Payer: Self-pay | Admitting: Internal Medicine

## 2024-01-01 ENCOUNTER — Ambulatory Visit: Payer: Medicare Other | Admitting: Internal Medicine

## 2024-01-01 VITALS — BP 130/72 | HR 62 | Temp 98.1°F | Ht 65.5 in | Wt 114.0 lb

## 2024-01-01 DIAGNOSIS — E559 Vitamin D deficiency, unspecified: Secondary | ICD-10-CM | POA: Diagnosis not present

## 2024-01-01 DIAGNOSIS — E7849 Other hyperlipidemia: Secondary | ICD-10-CM | POA: Diagnosis not present

## 2024-01-01 DIAGNOSIS — F5101 Primary insomnia: Secondary | ICD-10-CM

## 2024-01-01 DIAGNOSIS — E538 Deficiency of other specified B group vitamins: Secondary | ICD-10-CM | POA: Diagnosis not present

## 2024-01-01 DIAGNOSIS — I10 Essential (primary) hypertension: Secondary | ICD-10-CM | POA: Diagnosis not present

## 2024-01-01 DIAGNOSIS — R7309 Other abnormal glucose: Secondary | ICD-10-CM | POA: Diagnosis not present

## 2024-01-01 LAB — URINALYSIS, ROUTINE W REFLEX MICROSCOPIC
Bilirubin Urine: NEGATIVE
Leukocytes,Ua: NEGATIVE
Nitrite: NEGATIVE
Specific Gravity, Urine: 1.015 (ref 1.000–1.030)
Total Protein, Urine: NEGATIVE
Urine Glucose: NEGATIVE
Urobilinogen, UA: 0.2 (ref 0.0–1.0)
pH: 6.5 (ref 5.0–8.0)

## 2024-01-01 LAB — LIPID PANEL
Cholesterol: 179 mg/dL (ref 0–200)
HDL: 75.4 mg/dL (ref >39.00)
LDL Cholesterol: 91 mg/dL (ref 0–99)
NonHDL: 103.71
Total CHOL/HDL Ratio: 2
Triglycerides: 63 mg/dL (ref 0.0–149.0)
VLDL: 12.6 mg/dL (ref 0.0–40.0)

## 2024-01-01 LAB — CBC WITH DIFFERENTIAL/PLATELET
Basophils Absolute: 0 10*3/uL (ref 0.0–0.1)
Basophils Relative: 0.6 % (ref 0.0–3.0)
Eosinophils Absolute: 0.1 10*3/uL (ref 0.0–0.7)
Eosinophils Relative: 1 % (ref 0.0–5.0)
HCT: 44.1 % (ref 36.0–46.0)
Hemoglobin: 14.7 g/dL (ref 12.0–15.0)
Lymphocytes Relative: 15.7 % (ref 12.0–46.0)
Lymphs Abs: 1.2 10*3/uL (ref 0.7–4.0)
MCHC: 33.5 g/dL (ref 30.0–36.0)
MCV: 98.9 fL (ref 78.0–100.0)
Monocytes Absolute: 0.8 10*3/uL (ref 0.1–1.0)
Monocytes Relative: 10.9 % (ref 3.0–12.0)
Neutro Abs: 5.6 10*3/uL (ref 1.4–7.7)
Neutrophils Relative %: 71.8 % (ref 43.0–77.0)
Platelets: 236 10*3/uL (ref 150.0–400.0)
RBC: 4.46 Mil/uL (ref 3.87–5.11)
RDW: 13.8 % (ref 11.5–15.5)
WBC: 7.8 10*3/uL (ref 4.0–10.5)

## 2024-01-01 LAB — BASIC METABOLIC PANEL
BUN: 21 mg/dL (ref 6–23)
CO2: 29 meq/L (ref 19–32)
Calcium: 10.5 mg/dL (ref 8.4–10.5)
Chloride: 101 meq/L (ref 96–112)
Creatinine, Ser: 0.95 mg/dL (ref 0.40–1.20)
GFR: 57.49 mL/min — ABNORMAL LOW (ref >60.00)
Glucose, Bld: 95 mg/dL (ref 70–99)
Potassium: 4.8 meq/L (ref 3.5–5.1)
Sodium: 140 meq/L (ref 135–145)

## 2024-01-01 LAB — HEMOGLOBIN A1C: Hgb A1c MFr Bld: 6.3 % (ref 4.6–6.5)

## 2024-01-01 LAB — TSH: TSH: 4.92 u[IU]/mL (ref 0.35–5.50)

## 2024-01-01 LAB — VITAMIN D 25 HYDROXY (VIT D DEFICIENCY, FRACTURES): VITD: 62.03 ng/mL (ref 30.00–100.00)

## 2024-01-01 LAB — HEPATIC FUNCTION PANEL
ALT: 21 U/L (ref 0–35)
AST: 25 U/L (ref 0–37)
Albumin: 4.6 g/dL (ref 3.5–5.2)
Alkaline Phosphatase: 40 U/L (ref 39–117)
Bilirubin, Direct: 0.1 mg/dL (ref 0.0–0.3)
Total Bilirubin: 0.5 mg/dL (ref 0.2–1.2)
Total Protein: 8.1 g/dL (ref 6.0–8.3)

## 2024-01-01 LAB — VITAMIN B12: Vitamin B-12: 381 pg/mL (ref 211–911)

## 2024-01-01 MED ORDER — GABAPENTIN 100 MG PO CAPS: 100.0000 mg | ORAL_CAPSULE | Freq: Three times a day (TID) | ORAL | 1 refills | Status: DC

## 2024-01-01 MED ORDER — TEMAZEPAM 15 MG PO CAPS: 15.0000 mg | ORAL_CAPSULE | Freq: Every day | ORAL | 1 refills | Status: DC

## 2024-01-01 MED ORDER — AMLODIPINE BESYLATE 5 MG PO TABS: 5.0000 mg | ORAL_TABLET | Freq: Every day | ORAL | 3 refills | Status: DC

## 2024-01-01 MED ORDER — ATORVASTATIN CALCIUM 20 MG PO TABS: 20.0000 mg | ORAL_TABLET | Freq: Every day | ORAL | 3 refills | Status: DC

## 2024-01-01 MED ORDER — OLMESARTAN MEDOXOMIL-HCTZ 20-12.5 MG PO TABS: 0.5000 | ORAL_TABLET | Freq: Every day | ORAL | 3 refills | Status: DC

## 2024-01-01 MED ORDER — CARISOPRODOL 350 MG PO TABS: 350.0000 mg | ORAL_TABLET | Freq: Two times a day (BID) | ORAL | 1 refills | Status: DC | PRN

## 2024-01-01 NOTE — Progress Notes (Signed)
Patient ID: Diane Haney, female   DOB: 03/18/1946, 78 y.o.   MRN: [id number]         Chief Complaint:: yearly exam       HPI:  Diane Haney is a 78 y.o. female here overall doing ok, Pt denies chest pain, increased sob or doe, wheezing, orthopnea, PND, increased LE swelling, palpitations, dizziness or syncope.   Pt denies polydipsia, polyuria, or new focal neuro s/s.    Pt denies fever, wt loss, night sweats, loss of appetite, or other constitutional symptoms  Did have ight leg Mohs for squamous cell ca recently.  Working with pain management with right chest serratus nerve block in [facility] but did not help, could not tolerate cymbalta.  Does seem to get som ehelp with gaba, heat, and soma prn.  Did also see pulm at [facility] , sputum cultrue pending;  needs several med refills today.  No other new complaints Wt Readings from Last 3 Encounters:  01/01/24 114 lb (51.7 kg)  08/20/23 113 lb 9.6 oz (51.5 kg)  08/20/23 113 lb 9.6 oz (51.5 kg)   BP Readings from Last 3 Encounters:  01/01/24 130/72  08/20/23 110/68  06/19/23 124/78   Immunization History  Administered Date(s) Administered   Fluad Quad(high Dose 65+) 08/29/2019, 09/03/2020, 10/03/2022   Influenza, High Dose Seasonal PF 08/30/2021, 09/14/2023   Influenza,inj,Quad PF,6+ Mos 08/25/2013, 07/29/2014, 09/04/2018   Influenza-Unspecified 08/09/2016, 08/22/2017, 08/29/2019   PFIZER Comirnaty(Gray Top)Covid-19 Tri-Sucrose Vaccine 03/28/2021   PFIZER(Purple Top)SARS-COV-2 Vaccination 01/07/2020, 01/28/2020, 09/10/2020, 03/28/2021, 09/09/2021   PNEUMOCOCCAL CONJUGATE-20 01/18/2022   Pfizer Covid-19 Vaccine Bivalent Booster 47yrs & up 09/09/2021   Pneumococcal Conjugate-13 11/14/2013   Pneumococcal Polysaccharide-23 10/11/2006, 11/12/2012   Pneumococcal-Unspecified 08/04/2015   Respiratory Syncytial Virus Vaccine,Recomb Aduvanted(Arexvy) 10/21/2022   Td 12/12/2003, 09/08/2015   Tdap 09/08/2015   Zoster Recombinant(Shingrix) 12/16/2020,  02/28/2021   Zoster, Live 11/10/2014   Health Maintenance Due  Topic Date Due   Medicare Annual Wellness (AWV)  05/01/2018      Past Medical History:  Diagnosis Date   Breast cancer (HCC) 11/12/2012   Dx oct 2013 - [facility] - Invasive ductal carcinoma, s/o bilat mastectomy with + margin  - also for XRT soon   Bronchiectasis    Chronic fatigue fibromyalgia syndrome 11/12/2012   COPD (chronic obstructive pulmonary disease) (HCC)    DVT, lower extremity (HCC)    LLE   Fibromyalgia    GERD (gastroesophageal reflux disease)    History of shingles    Hyperlipidemia    Hypertension    Impaired glucose tolerance 11/14/2013   Migraine    Osteopenia    Osteoporosis 10/16/2007   Qualifier: Diagnosis of  By: Jonny Ruiz MD, Len Blalock    Pulmonary Mycobacterium avium complex (MAC) infection (HCC)    Squamous cell skin cancer, multiple sites    Vaccine counseling 01/18/2022   Past Surgical History:  Procedure Laterality Date   BRONCHIAL WASHINGS  10/17/2022   Procedure: BRONCHIAL WASHINGS;  Surgeon: Oretha Milch, MD;  Location: [facility];  Service: Cardiopulmonary;;   FOOT SURGERY     mastectomy bilateral oct 2013     shoulder impingement     VIDEO BRONCHOSCOPY N/A 05/07/2014   Procedure: VIDEO BRONCHOSCOPY WITHOUT FLUORO;  Surgeon: Storm Frisk, MD;  Location: [facility];  Service: Cardiopulmonary;  Laterality: N/A;   VIDEO BRONCHOSCOPY N/A 10/17/2022   Procedure: VIDEO BRONCHOSCOPY WITHOUT FLUORO;  Surgeon: Oretha Milch, MD;  Location: [facility];  Service: Cardiopulmonary;  Laterality: N/A;    reports that she quit smoking about 38 years ago. Her smoking use included cigarettes. She started smoking about 63 years ago. She has a 25 pack-year smoking history. She has never used smokeless tobacco. She reports that she does not drink alcohol and does not use drugs. family history includes Breast cancer in an other family member; Colon polyps in her father; Heart disease  in an other family member; Hyperlipidemia in an other family member; Hypertension in an other family member; Lung cancer in her father; Other in an other family member. Allergies  Allergen Reactions   Meloxicam Anaphylaxis    Increased blood pressure    Losartan     headache   Adhesive [Tape]     Blisters   Ciprofloxacin     REACTION: tendinitis   Clarithromycin     REACTION: severe reflux   Prednisone     REACTION: irregular heartbeat, not able to sleep   Current Outpatient Medications on File Prior to Visit  Medication Sig Dispense Refill   albuterol (PROVENTIL) (2.5 MG/3ML) 0.083% nebulizer solution Take 3 mLs (2.5 mg total) by nebulization every 6 (six) hours as needed for wheezing or shortness of breath. 360 mL 11   aspirin 81 MG EC tablet Take 81 mg by mouth daily.     calcium citrate-vitamin D (CITRACAL+D) 315-200 MG-UNIT per tablet Take 2 tablets by mouth daily.      carboxymethylcellulose (REFRESH PLUS) 0.5 % SOLN Place 1 drop into both eyes 2 (two) times daily as needed (dry eyes).     denosumab (PROLIA) 60 MG/ML SOSY injection Inject 60 mg into the skin every 6 (six) months. 3 mL 11   metoprolol tartrate (LOPRESSOR) 25 MG tablet TAKE 1/2 TABLET(12.5 MG) BY MOUTH TWICE DAILY 90 tablet 3   promethazine-dextromethorphan (PROMETHAZINE-DM) 6.25-15 MG/5ML syrup TAKE 5 ML BY MOUTH FOUR TIMES DAILY AS NEEDED FOR COUGH (Patient taking differently: Take 5 mLs by mouth 4 (four) times daily as needed for cough.) 473 mL 0   Respiratory Therapy Supplies (FLUTTER) DEVI Use as directed. (Patient taking differently: Use as directed.  Waymon Budge.  Has a smart vest as well.) 1 each 0   Sodium Chloride, Inhalant, 7 % NEBU Inhale 3 mLs into the lungs 2 (two) times daily.     traMADol (ULTRAM) 50 MG tablet Take 1 tablet (50 mg total) by mouth every 6 (six) hours as needed. 30 tablet 0   No current facility-administered medications on file prior to visit.        ROS:  All others reviewed and  negative.  Objective        PE:  BP 130/72 (BP Location: Right Arm, Patient Position: Sitting, Cuff Size: Normal)   Pulse 62   Temp 98.1 F (36.7 C) (Oral)   Ht 5' 5.5" (1.664 m)   Wt 114 lb (51.7 kg)   SpO2 96%   BMI 18.68 kg/m                 Constitutional: Pt appears in NAD               HENT: Head: NCAT.                Right Ear: External ear normal.                 Left Ear: External ear normal.                Eyes: .  Pupils are equal, round, and reactive to light. Conjunctivae and EOM are normal               Nose: without d/c or deformity               Neck: Neck supple. Gross normal ROM               Cardiovascular: Normal rate and regular rhythm.                 Pulmonary/Chest: Effort normal and breath sounds without rales or wheezing.                Abd:  Soft, NT, ND, + BS, no organomegaly               Neurological: Pt is alert. At baseline orientation, motor grossly intact               Skin: Skin is warm. No rashes, no other new lesions, LE edema - none               Psychiatric: Pt behavior is normal without agitation   Micro: none  Cardiac tracings I have personally interpreted today:  none  Pertinent Radiological findings (summarize): none   Lab Results  Component Value Date   WBC 7.8 01/01/2024   HGB 14.7 01/01/2024   HCT 44.1 01/01/2024   PLT 236.0 01/01/2024   GLUCOSE 95 01/01/2024   CHOL 179 01/01/2024   TRIG 63.0 01/01/2024   HDL 75.40 01/01/2024   LDLDIRECT 124.4 11/14/2013   LDLCALC 91 01/01/2024   ALT 21 01/01/2024   AST 25 01/01/2024   NA 140 01/01/2024   K 4.8 01/01/2024   CL 101 01/01/2024   CREATININE 0.95 01/01/2024   BUN 21 01/01/2024   CO2 29 01/01/2024   TSH 4.92 01/01/2024   INR 0.9 02/15/2021   HGBA1C 6.3 01/01/2024   Assessment/Plan:  Diane Haney is a 78 y.o. White or Caucasian [1] female with  has a past medical history of Breast cancer (HCC) (11/12/2012), Bronchiectasis, Chronic fatigue fibromyalgia syndrome  (11/12/2012), COPD (chronic obstructive pulmonary disease) (HCC), DVT, lower extremity (HCC), Fibromyalgia, GERD (gastroesophageal reflux disease), History of shingles, Hyperlipidemia, Hypertension, Impaired glucose tolerance (11/14/2013), Migraine, Osteopenia, Osteoporosis (10/16/2007), Pulmonary Mycobacterium avium complex (MAC) infection (HCC), Squamous cell skin cancer, multiple sites, and Vaccine counseling (01/18/2022).  Elevated glucose Lab Results  Component Value Date   HGBA1C 6.3 01/01/2024   Stable, pt to continue current medical treatment  - diet, wt control   Essential hypertension BP Readings from Last 3 Encounters:  01/01/24 130/72  08/20/23 110/68  06/19/23 124/78   Stable, pt to continue medical treatment lopressor 21.5 bid, norvasc 5 every day, benicar hct 20 12.5  - 1/2 qd   Hyperlipidemia Lab Results  Component Value Date   LDLCALC 91 01/01/2024   Stable, pt to continue current statin lipitor 20 qd   Insomnia Stable, for temazepam refill pr,  to f/u any worsening symptoms or concerns   Hypercalcemia Also for ;fu pth level,  to f/u any worsening symptoms or concerns  Followup: Return in about 1 year (around 12/31/2024).  Oliver Barre, MD 01/04/2024 10:08 PM [facility]

## 2024-01-01 NOTE — Progress Notes (Signed)
The test results show that your current treatment is OK, as the tests are stable.  Please continue the same plan.  There is no other need for change of treatment or further evaluation based on these results, at this time.  thanks 

## 2024-01-01 NOTE — Patient Instructions (Signed)

## 2024-01-03 LAB — PTH, INTACT AND CALCIUM
Calcium: 10.2 mg/dL (ref 8.6–10.4)
PTH: 28 pg/mL (ref 16–77)

## 2024-01-04 ENCOUNTER — Encounter: Payer: Self-pay | Admitting: Internal Medicine

## 2024-01-04 NOTE — Assessment & Plan Note (Signed)
Also for ;fu pth level,  to f/u any worsening symptoms or concerns

## 2024-01-04 NOTE — Assessment & Plan Note (Signed)
Stable, for temazepam refill pr,  to f/u any worsening symptoms or concerns

## 2024-01-04 NOTE — Assessment & Plan Note (Signed)
Lab Results  Component Value Date   LDLCALC 91 01/01/2024   Stable, pt to continue current statin lipitor 20 qd

## 2024-01-04 NOTE — Assessment & Plan Note (Signed)
Lab Results  Component Value Date   HGBA1C 6.3 01/01/2024   Stable, pt to continue current medical treatment  - diet, wt control

## 2024-01-04 NOTE — Assessment & Plan Note (Signed)
BP Readings from Last 3 Encounters:  01/01/24 130/72  08/20/23 110/68  06/19/23 124/78   Stable, pt to continue medical treatment lopressor 21.5 bid, norvasc 5 every day, benicar hct 20 12.5  - 1/2 qd

## 2024-02-05 ENCOUNTER — Other Ambulatory Visit: Payer: Self-pay

## 2024-02-05 DIAGNOSIS — M810 Age-related osteoporosis without current pathological fracture: Secondary | ICD-10-CM

## 2024-02-05 MED ORDER — DENOSUMAB 60 MG/ML ~~LOC~~ SOSY: 60.0000 mg | PREFILLED_SYRINGE | Freq: Once | SUBCUTANEOUS | Status: AC

## 2024-02-06 ENCOUNTER — Encounter (HOSPITAL_BASED_OUTPATIENT_CLINIC_OR_DEPARTMENT_OTHER): Payer: Self-pay | Admitting: Pulmonary Disease

## 2024-02-06 ENCOUNTER — Other Ambulatory Visit (HOSPITAL_BASED_OUTPATIENT_CLINIC_OR_DEPARTMENT_OTHER): Payer: Self-pay

## 2024-02-06 DIAGNOSIS — J479 Bronchiectasis, uncomplicated: Secondary | ICD-10-CM

## 2024-02-06 NOTE — Telephone Encounter (Signed)
 Can you order CT or is this okay?

## 2024-02-06 NOTE — Telephone Encounter (Signed)
 Ordered CT and we can discuss bronchoscopy on follow-up visit based on results

## 2024-02-10 ENCOUNTER — Ambulatory Visit (HOSPITAL_BASED_OUTPATIENT_CLINIC_OR_DEPARTMENT_OTHER)
Admission: RE | Admit: 2024-02-10 | Discharge: 2024-02-10 | Disposition: A | Discharge status: Home / Self Care | Payer: Medicare Other | Source: Ambulatory Visit | Attending: Pulmonary Disease | Admitting: Pulmonary Disease

## 2024-02-10 DIAGNOSIS — J479 Bronchiectasis, uncomplicated: Secondary | ICD-10-CM | POA: Insufficient documentation

## 2024-02-13 ENCOUNTER — Other Ambulatory Visit (HOSPITAL_BASED_OUTPATIENT_CLINIC_OR_DEPARTMENT_OTHER): Payer: Self-pay | Admitting: Pulmonary Disease

## 2024-02-18 ENCOUNTER — Ambulatory Visit (HOSPITAL_BASED_OUTPATIENT_CLINIC_OR_DEPARTMENT_OTHER): Payer: Medicare Other | Admitting: Pulmonary Disease

## 2024-02-18 ENCOUNTER — Encounter (HOSPITAL_BASED_OUTPATIENT_CLINIC_OR_DEPARTMENT_OTHER): Payer: Self-pay | Admitting: Pulmonary Disease

## 2024-02-18 VITALS — BP 110/68 | HR 63 | Wt 113.0 lb

## 2024-02-18 DIAGNOSIS — A310 Pulmonary mycobacterial infection: Secondary | ICD-10-CM | POA: Diagnosis not present

## 2024-02-18 DIAGNOSIS — J479 Bronchiectasis, uncomplicated: Secondary | ICD-10-CM | POA: Diagnosis not present

## 2024-02-18 MED ORDER — ALBUTEROL SULFATE (2.5 MG/3ML) 0.083% IN NEBU: 2.5000 mg | INHALATION_SOLUTION | Freq: Four times a day (QID) | RESPIRATORY_TRACT | 11 refills | Status: AC | PRN

## 2024-02-18 NOTE — Progress Notes (Signed)
 Subjective:    Patient ID: Diane Haney, female    DOB: 05-31-1946, 78 y.o.   MRN: [id number]  HPI  78  yo ex-smoker with bronchiectasis and MAI  She smoked about 20 pack years before she quit in her early 30s She has been treated 3 times for MAC.  Last treatment was  2014 -2016 (azithromycin rifampin and ethambutol.)  MAI was redemonstrated in sputum in 2022.   5/ 2005 Bronchoscopy >> MAI (S- clarithro, eth, RIF 8.0, synergy positive for E/R) and fortuitum (S- cipro/smikacin/tigecycline).She began antibiotics May 2005 and was treated with ETH/Azithro and cipro until March 2007.  10/2022 bscopy >> MAC     PMH -  stage I moderately differentiated invasive ductal carcinoma of the right breast s/p RT.  Idiopathic LLE DVT (Dx at age 58) sensorineural hearing loss in both ears     Meds - did not tolerate anoro- due to tremors and palpitations   spiriva 02/2021 did not help, now on incruse  Lung function-FEV1 has dropped from 77% in 2021 to 66% in 2023 and 63% now.  FVC and TLC are maintained, DLCO has also dropped from 74% to 62% now. HRCT shows waxing and waning nodularity, bronchiectasis appears stable.  she is unable to tolerate bronchodilator therapy due to tremors and palpitation. She is not able to tolerate a vest but does use flutter valve and hypertonic saline.   Miss Diane Haney, a patient with a history of Mycobacterium Avium Complex (MAC) infection and bronchitis, presents with a worsening cough. She reports an exacerbation a couple of weeks ago, characterized by thick cough and fever, which resolved on its own after three days. She denies any other symptoms during the exacerbation and tested negative for COVID and flu. The patient manages her symptoms with an albuterol inhaler and nebulizer, which she uses daily. She reports that the albuterol inhaler can cause headaches, even with one puff. The patient has not been treated for the MAC infection since it was discovered in a bronchoscopy  in 2023. She is currently considering a pulmonary rehab program to improve her quality of life and manage her symptoms. 01/2024 in contact with Dr Doralee Albino at [facility] and he would like me to have another CT scan and bronchoscopy done   REpeat HRCT seems unchanged  Significant tests/ events reviewed   07/2023 HRCT chest  Cylindrical bronchiectasis with slight waxing and waning nodularity 10/2022 HRCT chest >> stable bronchiectasis and tree-in-bud opacities 12/2021 HRCT >> previous groundglass in the upper lobes has resolved, unchanged bronchiectasis of right apex, right middle lobe and lingula.  Clustered tree-in-bud nodularity in bilateral lung bases new from prior     11/2020 HRCt >> Patchy bilateral bronchiectasis, volume loss, peribronchovascular nodularity and scattered mucoid impaction, mildly progressive from 11/24/2019   11/24/2019-CT chest without contrast-chronic changes of MAC,  stable mild emphysematous changes, areas of bronchiectasis and chronic atelectasis, larger areas of nodularity in the right lung seen on the prior study have resolved and there are new areas of vague nodularity.   HRCT Chest 08/2018 >> stable compared to 2018   CT 01/2015>Interval slight progression in right apical consolidation with air bronchograms, probably progression of radiation fibrosis. The left apical component is unchanged.   - stable chronic lung disease with bronchiectasis,scarring and peribronchial nodularity      08/2023 PFTs >> moderate airway obstruction, decreased FEV1 1.46/63%, slight drop in DLCO 12.4/62%   05/2022 spirometry moderate airway obstruction, ratio 65, FEV1 66%, FVC 76%, DLCO 71%/14.04  05/2020 PFTs  mild restriction with mild diffusion defect/ FEV1 1.81 (77%), ratio 73, DLCO 15.53 (74%) >> drop in FEV1 from 2.16-1.81 and drop in FVC from 2.71-2.48    09/2017  PFT-stable with FEV1 94% , ratio 76 , FVC 94%   2011 spirometry normal  Review of Systems neg for any significant sore  throat, dysphagia, itching, sneezing, nasal congestion or excess/ purulent secretions, fever, chills, sweats, unintended wt loss, pleuritic or exertional cp, hempoptysis, orthopnea pnd or change in chronic leg swelling. Also denies presyncope, palpitations, heartburn, abdominal pain, nausea, vomiting, diarrhea or change in bowel or urinary habits, dysuria,hematuria, rash, arthralgias, visual complaints, headache, numbness weakness or ataxia.     Objective:   Physical Exam  Gen. Pleasant, well-nourished, in no distress ENT - no thrush, no pallor/icterus,no post nasal drip Neck: No JVD, no thyromegaly, no carotid bruits Lungs: no use of accessory muscles, no dullness to percussion, clear without rales or rhonchi  Cardiovascular: Rhythm regular, heart sounds  normal, no murmurs or gallops, no peripheral edema Musculoskeletal: No deformities, no cyanosis or clubbing        Assessment & Plan:    Mycobacterium avium complex (MAC) infection Bronchiectasis wo exac She has a known MAC infection identified via bronchoscopy in November 2023. No treatment has been initiated since diagnosis. Recent CT scans are unchanged compared to last year. PFTs show a drop in lung function, with discrepancies between test sites. She reports worsening cough and a recent exacerbation with fever, which resolved spontaneously. Treatment is complicated by her history of multiple treatments and potential adverse effects. The current approach is to wait and watch, given the unchanged CT findings and her ability to manage symptoms. She prefers to avoid another bronchoscopy and is considering local treatment options with an infectious disease specialist if necessary. - Review official CT scan report and PFT results from [facility] doctor to discuss treatment plan and rationale for repeat bronchoscopy - Refill albuterol nebulizer prescription - Continue airway clearance techniques including saline nebulization and  flutter valve use - Consider pulmonary rehabilitation referral ([facility]) to address potential deconditioning  Pulmonary deconditioning She experiences daily dyspnea with certain activities, likely due to deconditioning rather than a decline in lung function. She engages in regular exercise, including treadmill and exercise bike use. Pulmonary rehabilitation is proposed to improve quality of life and assess if symptoms are due to deconditioning. - Refer to pulmonary rehabilitation program at [facility] - Encourage her to evaluate the program before committing - Discuss potential insurance coverage for pulmonary rehabilitation

## 2024-02-18 NOTE — Patient Instructions (Signed)
 Refill on albuterol nebs  I will Speak to Dr. Doralee Albino at [facility]

## 2024-02-19 ENCOUNTER — Telehealth: Payer: Self-pay

## 2024-02-19 NOTE — Telephone Encounter (Signed)
 Prolia VOB initiated via [email or web address]  Next Prolia inj DUE: 03/26/24

## 2024-03-03 ENCOUNTER — Encounter (HOSPITAL_BASED_OUTPATIENT_CLINIC_OR_DEPARTMENT_OTHER): Payer: Self-pay | Admitting: Pulmonary Disease

## 2024-03-03 ENCOUNTER — Telehealth: Payer: Self-pay | Admitting: Pulmonary Disease

## 2024-03-03 NOTE — Telephone Encounter (Signed)
 Tiffany calling with call report. For CT scan.Tiffany phone number is [phone].

## 2024-03-03 NOTE — Telephone Encounter (Signed)
Report in chart for review.

## 2024-03-03 NOTE — Progress Notes (Signed)
 Pt.notified

## 2024-03-04 NOTE — Telephone Encounter (Signed)
 Pt ready for scheduling for PROLIA on or after : 03/26/24  Option# 1: Buy/Bill (Office supplied medication)  Out-of-pocket cost due at time of clinic visit: $257 (DEDUCTIBLE)  Number of injection/visits approved: ---  Primary: MEDICARE Prolia co-insurance: 0% Admin fee co-insurance: 0%  Secondary: [employer]BS[place]-MED[place]P Prolia co-insurance: Covers the Medicare Part B co-insurance and 100% of the excess charges. This plan does not cover the Medicare Part B deductible. Admin fee co-insurance:   Medical Benefit Details: Date Benefits were checked: 02/26/24 Deductible: $0 Met of $257 Required/ Coinsurance: 0%/ Admin Fee: 0%  Prior Auth: N/A PA# Expiration Date:   # of doses approved: ----------------------------------------------------------------------- Option# 2- Med Obtained from pharmacy:  Pharmacy benefit: Copay $--- (Paid to pharmacy) Admin Fee: --- (Pay at clinic)  Prior Auth: N/A PA# Expiration Date:   # of doses approved:   If patient wants fill through the pharmacy benefit please send prescription to:  --- , and include estimated need by date in rx notes. Pharmacy will ship medication directly to the office.  Patient NOT eligible for Prolia Copay Card. Copay Card can make patient's cost as little as $25. Link to apply: [email or web address]://[email or web address]  ** This summary of benefits is an estimation of the patient's out-of-pocket cost. Exact cost may very based on individual plan coverage.

## 2024-03-04 NOTE — Telephone Encounter (Signed)
 Diane Haney

## 2024-03-05 ENCOUNTER — Encounter: Payer: Self-pay | Admitting: Internal Medicine

## 2024-03-05 NOTE — Telephone Encounter (Signed)
 Ok to let pt know - ok for ROV for BP check in April 2025, then we'll need to plan for after that later.   thanks

## 2024-03-05 NOTE — Telephone Encounter (Signed)
**Note De-identified  [place] Obfuscation** Please advise 

## 2024-03-13 ENCOUNTER — Ambulatory Visit (INDEPENDENT_AMBULATORY_CARE_PROVIDER_SITE_OTHER): Admitting: Internal Medicine

## 2024-03-13 ENCOUNTER — Ambulatory Visit (INDEPENDENT_AMBULATORY_CARE_PROVIDER_SITE_OTHER)

## 2024-03-13 ENCOUNTER — Encounter: Payer: Self-pay | Admitting: Internal Medicine

## 2024-03-13 VITALS — BP 120/62 | HR 88 | Temp 99.7°F | Ht 65.5 in | Wt 115.0 lb

## 2024-03-13 DIAGNOSIS — R7309 Other abnormal glucose: Secondary | ICD-10-CM | POA: Diagnosis not present

## 2024-03-13 DIAGNOSIS — R051 Acute cough: Secondary | ICD-10-CM | POA: Diagnosis not present

## 2024-03-13 DIAGNOSIS — I10 Essential (primary) hypertension: Secondary | ICD-10-CM | POA: Diagnosis not present

## 2024-03-13 MED ORDER — AMOXICILLIN-POT CLAVULANATE 875-125 MG PO TABS: 1.0000 | ORAL_TABLET | Freq: Two times a day (BID) | ORAL | 0 refills | Status: DC

## 2024-03-13 MED ORDER — PROMETHAZINE-DM 6.25-15 MG/5ML PO SYRP: 5.0000 mL | ORAL_SOLUTION | Freq: Four times a day (QID) | ORAL | 0 refills | Status: AC | PRN

## 2024-03-13 NOTE — Patient Instructions (Signed)
Please take all new medication as prescribed   - the antibiotic, and cough medicine  Please continue all other medications as before, and refills have been done if requested.  Please have the pharmacy call with any other refills you may need.  Please keep your appointments with your specialists as you may have planned  Please go to the XRAY Department in the first floor for the x-ray testing  You will be contacted by phone if any changes need to be made immediately.  Otherwise, you will receive a letter about your results with an explanation, but please check with MyChart first.

## 2024-03-13 NOTE — Progress Notes (Signed)
 Patient ID: Diane Haney, female   DOB: Apr 17, 1946, 78 y.o.   MRN: [id number]        Chief Complaint: follow up productive cough, hyperglycemia, htn       HPI:  Diane Haney is a 78 y.o. female Here with acute onset mild to mod 2-3 days ST, HA, general weakness and malaise, with prod cough greenish sputum, but Pt denies chest pain, increased sob or doe, wheezing, orthopnea, PND, increased LE swelling, palpitations, dizziness or syncope.   Pt denies polydipsia, polyuria, or new focal neuro s/s.    Pt denies fever, wt loss, night sweats, loss of appetite, or other constitutional symptoms         Wt Readings from Last 3 Encounters:  03/13/24 115 lb (52.2 kg)  02/18/24 113 lb (51.3 kg)  01/01/24 114 lb (51.7 kg)   BP Readings from Last 3 Encounters:  03/13/24 120/62  02/18/24 110/68  01/01/24 130/72         Past Medical History:  Diagnosis Date   Breast cancer (HCC) 11/12/2012   Dx oct 2013 - [facility] - Invasive ductal carcinoma, s/o bilat mastectomy with + margin  - also for XRT soon   Bronchiectasis    Chronic fatigue fibromyalgia syndrome 11/12/2012   COPD (chronic obstructive pulmonary disease) (HCC)    DVT, lower extremity (HCC)    LLE   Fibromyalgia    GERD (gastroesophageal reflux disease)    History of shingles    Hyperlipidemia    Hypertension    Impaired glucose tolerance 11/14/2013   Migraine    Osteopenia    Osteoporosis 10/16/2007   Qualifier: Diagnosis of  By: Jonny Ruiz MD, Len Blalock    Pulmonary Mycobacterium avium complex (MAC) infection (HCC)    Squamous cell skin cancer, multiple sites    Vaccine counseling 01/18/2022   Past Surgical History:  Procedure Laterality Date   BRONCHIAL WASHINGS  10/17/2022   Procedure: BRONCHIAL WASHINGS;  Surgeon: Oretha Milch, MD;  Location: [facility];  Service: Cardiopulmonary;;   FOOT SURGERY     mastectomy bilateral oct 2013     shoulder impingement     VIDEO BRONCHOSCOPY N/A 05/07/2014   Procedure: VIDEO  BRONCHOSCOPY WITHOUT FLUORO;  Surgeon: Storm Frisk, MD;  Location: [facility];  Service: Cardiopulmonary;  Laterality: N/A;   VIDEO BRONCHOSCOPY N/A 10/17/2022   Procedure: VIDEO BRONCHOSCOPY WITHOUT FLUORO;  Surgeon: Oretha Milch, MD;  Location: [facility];  Service: Cardiopulmonary;  Laterality: N/A;    reports that she quit smoking about 38 years ago. Her smoking use included cigarettes. She started smoking about 63 years ago. She has a 25 pack-year smoking history. She has never used smokeless tobacco. She reports that she does not drink alcohol and does not use drugs. family history includes Breast cancer in an other family member; Colon polyps in her father; Heart disease in an other family member; Hyperlipidemia in an other family member; Hypertension in an other family member; Lung cancer in her father; Other in an other family member. Allergies  Allergen Reactions   Meloxicam Anaphylaxis    Increased blood pressure    Losartan     headache   Adhesive [Tape]     Blisters   Ciprofloxacin     REACTION: tendinitis   Clarithromycin     REACTION: severe reflux   Prednisone     REACTION: irregular heartbeat, not able to sleep   Current Outpatient Medications on File Prior to Visit  Medication Sig Dispense Refill   albuterol (PROVENTIL) (2.5 MG/3ML) 0.083% nebulizer solution USE 1 VIAL VIA NEBULIZER EVERY 6 HOURS AS NEEDED FOR WHEEZING OR SHORTNESS OF BREATH 360 mL 11   albuterol (PROVENTIL) (2.5 MG/3ML) 0.083% nebulizer solution Take 3 mLs (2.5 mg total) by nebulization every 6 (six) hours as needed for wheezing or shortness of breath. 360 mL 11   amLODipine (NORVASC) 5 MG tablet Take 1 tablet (5 mg total) by mouth daily. 90 tablet 3   aspirin 81 MG EC tablet Take 81 mg by mouth daily.     atorvastatin (LIPITOR) 20 MG tablet Take 1 tablet (20 mg total) by mouth daily. 90 tablet 3   calcium citrate-vitamin D (CITRACAL+D) 315-200 MG-UNIT per tablet Take 2 tablets by mouth daily.       carboxymethylcellulose (REFRESH PLUS) 0.5 % SOLN Place 1 drop into both eyes 2 (two) times daily as needed (dry eyes).     carisoprodol (SOMA) 350 MG tablet Take 1 tablet (350 mg total) by mouth 2 (two) times daily as needed for muscle spasms. 180 tablet 1   denosumab (PROLIA) 60 MG/ML SOSY injection Inject 60 mg into the skin every 6 (six) months. 3 mL 11   gabapentin (NEURONTIN) 100 MG capsule Take 1 capsule (100 mg total) by mouth 3 (three) times daily. 270 capsule 1   metoprolol tartrate (LOPRESSOR) 25 MG tablet TAKE 1/2 TABLET(12.5 MG) BY MOUTH TWICE DAILY 90 tablet 3   olmesartan-hydrochlorothiazide (BENICAR HCT) 20-12.5 MG tablet Take 0.5 tablets by mouth daily. 45 tablet 3   Respiratory Therapy Supplies (FLUTTER) DEVI Use as directed. (Patient taking differently: Use as directed.  Waymon Budge.  Has a smart vest as well.) 1 each 0   Sodium Chloride, Inhalant, 7 % NEBU Inhale 3 mLs into the lungs 2 (two) times daily.     temazepam (RESTORIL) 15 MG capsule Take 1 capsule (15 mg total) by mouth at bedtime. 90 capsule 1   traMADol (ULTRAM) 50 MG tablet Take 1 tablet (50 mg total) by mouth every 6 (six) hours as needed. 30 tablet 0   Current Facility-Administered Medications on File Prior to Visit  Medication Dose Route Frequency Provider Last Rate Last Admin   [START ON 03/27/2024] denosumab (PROLIA) injection 60 mg  60 mg Subcutaneous Once Corwin Levins, MD            ROS:  All others reviewed and negative.  Objective        PE:  BP 120/62 (BP Location: Right Arm, Patient Position: Sitting, Cuff Size: Normal)   Pulse 88   Temp 99.7 F (37.6 C) (Oral)   Ht 5' 5.5" (1.664 m)   Wt 115 lb (52.2 kg)   SpO2 94%   BMI 18.85 kg/m                 Constitutional: Pt appears mild ill               HENT: Head: NCAT.                Right Ear: External ear normal.                 Left Ear: External ear normal.                Eyes: . Pupils are equal, round, and reactive to light.  Conjunctivae and EOM are normal               Nose: without  d/c or deformity               Neck: Neck supple. Gross normal ROM               Cardiovascular: Normal rate and regular rhythm.                 Pulmonary/Chest: Effort normal and breath sounds decreased  without rales or wheezing.                Abd:  Soft, NT, ND, + BS, no organomegaly               Neurological: Pt is alert. At baseline orientation, motor grossly intact               Skin: Skin is warm. No rashes, no other new lesions, LE edema - none               Psychiatric: Pt behavior is normal without agitation   Micro: none  Cardiac tracings I have personally interpreted today:  none  Pertinent Radiological findings (summarize): none   Lab Results  Component Value Date   WBC 7.8 01/01/2024   HGB 14.7 01/01/2024   HCT 44.1 01/01/2024   PLT 236.0 01/01/2024   GLUCOSE 95 01/01/2024   CHOL 179 01/01/2024   TRIG 63.0 01/01/2024   HDL 75.40 01/01/2024   LDLDIRECT 124.4 11/14/2013   LDLCALC 91 01/01/2024   ALT 21 01/01/2024   AST 25 01/01/2024   NA 140 01/01/2024   K 4.8 01/01/2024   CL 101 01/01/2024   CREATININE 0.95 01/01/2024   BUN 21 01/01/2024   CO2 29 01/01/2024   TSH 4.92 01/01/2024   INR 0.9 02/15/2021   HGBA1C 6.3 01/01/2024   Assessment/Plan:  Diane Haney is a 78 y.o. White or Caucasian [1] female with  has a past medical history of Breast cancer (HCC) (11/12/2012), Bronchiectasis, Chronic fatigue fibromyalgia syndrome (11/12/2012), COPD (chronic obstructive pulmonary disease) (HCC), DVT, lower extremity (HCC), Fibromyalgia, GERD (gastroesophageal reflux disease), History of shingles, Hyperlipidemia, Hypertension, Impaired glucose tolerance (11/14/2013), Migraine, Osteopenia, Osteoporosis (10/16/2007), Pulmonary Mycobacterium avium complex (MAC) infection (HCC), Squamous cell skin cancer, multiple sites, and Vaccine counseling (01/18/2022).  Essential hypertension BP Readings from Last 3 Encounters:   03/13/24 120/62  02/18/24 110/68  01/01/24 130/72   Stable, pt to continue medical treatment norvasc 5 every day, lopressor 12.5 bid, benicar hct 20 125.5 qd   Acute cough Mild to mod, for antibx course, augmentin bid , cough med prn, cxr, and to f/u any worsening symptoms or concerns   Elevated glucose Lab Results  Component Value Date   HGBA1C 6.3 01/01/2024   Stable, pt to continue current medical treatment  - diet, wt control  Followup: Return if symptoms worsen or fail to improve.  Oliver Barre, MD 03/15/2024 6:03 PM [facility]

## 2024-03-15 ENCOUNTER — Encounter: Payer: Self-pay | Admitting: Internal Medicine

## 2024-03-15 DIAGNOSIS — R051 Acute cough: Secondary | ICD-10-CM | POA: Insufficient documentation

## 2024-03-15 NOTE — Assessment & Plan Note (Signed)
 BP Readings from Last 3 Encounters:  03/13/24 120/62  02/18/24 110/68  01/01/24 130/72   Stable, pt to continue medical treatment norvasc 5 every day, lopressor 12.5 bid, benicar hct 20 125.5 qd

## 2024-03-15 NOTE — Assessment & Plan Note (Addendum)
 Mild to mod, for antibx course, augmentin bid , cough med prn, cxr, and to f/u any worsening symptoms or concerns

## 2024-03-15 NOTE — Assessment & Plan Note (Signed)
 Lab Results  Component Value Date   HGBA1C 6.3 01/01/2024   Stable, pt to continue current medical treatment  - diet, wt control

## 2024-04-02 ENCOUNTER — Encounter: Payer: Self-pay | Admitting: Internal Medicine

## 2024-04-02 ENCOUNTER — Ambulatory Visit: Payer: Medicare Other

## 2024-04-02 ENCOUNTER — Ambulatory Visit: Admitting: Internal Medicine

## 2024-04-02 VITALS — BP 132/84 | HR 64 | Temp 98.0°F | Ht 65.5 in | Wt 113.2 lb

## 2024-04-02 DIAGNOSIS — R7309 Other abnormal glucose: Secondary | ICD-10-CM | POA: Diagnosis not present

## 2024-04-02 DIAGNOSIS — I10 Essential (primary) hypertension: Secondary | ICD-10-CM

## 2024-04-02 DIAGNOSIS — R0781 Pleurodynia: Secondary | ICD-10-CM | POA: Insufficient documentation

## 2024-04-02 DIAGNOSIS — M810 Age-related osteoporosis without current pathological fracture: Secondary | ICD-10-CM

## 2024-04-02 DIAGNOSIS — R0789 Other chest pain: Secondary | ICD-10-CM | POA: Insufficient documentation

## 2024-04-02 MED ORDER — AZITHROMYCIN 500 MG PO TABS: ORAL_TABLET | ORAL | Status: DC

## 2024-04-02 MED ORDER — ETHAMBUTOL HCL 100 MG PO TABS: ORAL_TABLET | ORAL | 0 refills | Status: DC

## 2024-04-02 MED ORDER — RIFAMPIN 300 MG PO CAPS: ORAL_CAPSULE | ORAL | Status: DC

## 2024-04-02 MED ORDER — DENOSUMAB 60 MG/ML ~~LOC~~ SOSY
60.0000 mg | PREFILLED_SYRINGE | Freq: Once | SUBCUTANEOUS | Status: AC
  Administered 2024-04-02: 60 mg via SUBCUTANEOUS

## 2024-04-02 NOTE — Assessment & Plan Note (Signed)
 Pt for Prolia  shot today

## 2024-04-02 NOTE — Patient Instructions (Signed)
 You had the Prolia  shot today  Please continue all other medications as before, and refills have been done if requested.  Please have the pharmacy call with any other refills you may need.  Please continue your efforts at being more active, low cholesterol diet, and weight control.  Please keep your appointments with your specialists as you may have planned  Please make an Appointment to return in 4 months, or sooner if needed

## 2024-04-02 NOTE — Assessment & Plan Note (Signed)
 No falls or trauma, likely due to msk strain with coughing, declines cxr or change in cough med,  to f/u any worsening symptoms or concerns

## 2024-04-02 NOTE — Assessment & Plan Note (Signed)
 Lab Results  Component Value Date   HGBA1C 6.3 01/01/2024   Stable, pt to continue current medical treatment  - diet, wt control

## 2024-04-02 NOTE — Progress Notes (Signed)
 Patient ID: Diane Haney, female   DOB: [birth year], 78 y.o.   MRN: [id number]        Chief Complaint: follow up HTN, right rib pain, osteoporosis, hyperglycemia       HPI:  Diane Haney is a 78 y.o. female here with c/o 3 days onset tender sore right lower anterolateral rib pain with persistent coughing, but can only take her cough med at bedtime as makes her sleepy.  Now, On triple antibx for MAC per [facility] pulmonary.   Due for prolia  shot today.  Pt denies increased sob or doe, wheezing, orthopnea, PND, increased LE swelling, palpitations, dizziness or syncope.   Pt denies polydipsia, polyuria, or new focal neuro s/s. Wt Readings from Last 3 Encounters:  04/02/24 113 lb 4 oz (51.4 kg)  03/13/24 115 lb (52.2 kg)  02/18/24 113 lb (51.3 kg)   BP Readings from Last 3 Encounters:  04/02/24 132/84  03/13/24 120/62  02/18/24 110/68         Past Medical History:  Diagnosis Date   Breast cancer (HCC) 11/12/2012   Dx oct 2013 - [facility] - Invasive ductal carcinoma, s/o bilat mastectomy with + margin  - also for XRT soon   Bronchiectasis    Chronic fatigue fibromyalgia syndrome 11/12/2012   COPD (chronic obstructive pulmonary disease) (HCC)    DVT, lower extremity (HCC)    LLE   Fibromyalgia    GERD (gastroesophageal reflux disease)    History of shingles    Hyperlipidemia    Hypertension    Impaired glucose tolerance 11/14/2013   Migraine    Osteopenia    Osteoporosis 10/16/2007   Qualifier: Diagnosis of  By: Autry Legions MD, Alveda Aures    Pulmonary Mycobacterium avium complex (MAC) infection (HCC)    Squamous cell skin cancer, multiple sites    Vaccine counseling 01/18/2022   Past Surgical History:  Procedure Laterality Date   BRONCHIAL WASHINGS  10/17/2022   Procedure: BRONCHIAL WASHINGS;  Surgeon: Lind Repine, MD;  Location: [facility];  Service: Cardiopulmonary;;   FOOT SURGERY     mastectomy bilateral oct 2013     shoulder impingement     VIDEO BRONCHOSCOPY N/A  05/07/2014   Procedure: VIDEO BRONCHOSCOPY WITHOUT FLUORO;  Surgeon: Vernell Goldsmith, MD;  Location: [facility];  Service: Cardiopulmonary;  Laterality: N/A;   VIDEO BRONCHOSCOPY N/A 10/17/2022   Procedure: VIDEO BRONCHOSCOPY WITHOUT FLUORO;  Surgeon: Lind Repine, MD;  Location: [facility];  Service: Cardiopulmonary;  Laterality: N/A;    reports that she quit smoking about 38 years ago. Her smoking use included cigarettes. She started smoking about 63 years ago. She has a 25 pack-year smoking history. She has never used smokeless tobacco. She reports that she does not drink alcohol and does not use drugs. family history includes Breast cancer in an other family member; Colon polyps in her father; Heart disease in an other family member; Hyperlipidemia in an other family member; Hypertension in an other family member; Lung cancer in her father; Other in an other family member. Allergies  Allergen Reactions   Meloxicam Anaphylaxis    Increased blood pressure    Losartan      headache   Adhesive [Tape]     Blisters   Ciprofloxacin     REACTION: tendinitis   Clarithromycin     REACTION: severe reflux   Prednisone     REACTION: irregular heartbeat, not able to sleep   Tramadol  Other (See Comments)  Current Outpatient Medications on File Prior to Visit  Medication Sig Dispense Refill   albuterol  (PROVENTIL ) (2.5 MG/3ML) 0.083% nebulizer solution USE 1 VIAL VIA NEBULIZER EVERY 6 HOURS AS NEEDED FOR WHEEZING OR SHORTNESS OF BREATH 360 mL 11   albuterol  (PROVENTIL ) (2.5 MG/3ML) 0.083% nebulizer solution Take 3 mLs (2.5 mg total) by nebulization every 6 (six) hours as needed for wheezing or shortness of breath. 360 mL 11   amLODipine  (NORVASC ) 5 MG tablet Take 1 tablet (5 mg total) by mouth daily. 90 tablet 3   aspirin 81 MG EC tablet Take 81 mg by mouth daily.     atorvastatin  (LIPITOR) 20 MG tablet Take 1 tablet (20 mg total) by mouth daily. 90 tablet 3   calcium  citrate-vitamin D   (CITRACAL+D) 315-200 MG-UNIT per tablet Take 2 tablets by mouth daily.      carboxymethylcellulose (REFRESH PLUS) 0.5 % SOLN Place 1 drop into both eyes 2 (two) times daily as needed (dry eyes).     carisoprodol  (SOMA ) 350 MG tablet Take 1 tablet (350 mg total) by mouth 2 (two) times daily as needed for muscle spasms. 180 tablet 1   denosumab  (PROLIA ) 60 MG/ML SOSY injection Inject 60 mg into the skin every 6 (six) months. 3 mL 11   gabapentin  (NEURONTIN ) 100 MG capsule Take 1 capsule (100 mg total) by mouth 3 (three) times daily. 270 capsule 1   metoprolol  tartrate (LOPRESSOR ) 25 MG tablet TAKE 1/2 TABLET(12.5 MG) BY MOUTH TWICE DAILY 90 tablet 3   olmesartan -hydrochlorothiazide (BENICAR  HCT) 20-12.5 MG tablet Take 0.5 tablets by mouth daily. 45 tablet 3   promethazine -dextromethorphan (PROMETHAZINE -DM) 6.25-15 MG/5ML syrup Take 5 mLs by mouth 4 (four) times daily as needed. 473 mL 0   Respiratory Therapy Supplies (FLUTTER) DEVI Use as directed. (Patient taking differently: Use as directed.  [employer].  Has a smart vest as well.) 1 each 0   Sodium Chloride , Inhalant, 7 % NEBU Inhale 3 mLs into the lungs 2 (two) times daily.     temazepam  (RESTORIL ) 15 MG capsule Take 1 capsule (15 mg total) by mouth at bedtime. 90 capsule 1   Current Facility-Administered Medications on File Prior to Visit  Medication Dose Route Frequency Provider Last Rate Last Admin   denosumab  (PROLIA ) injection 60 mg  60 mg Subcutaneous Once Roslyn Coombe, MD            ROS:  All others reviewed and negative.  Objective        PE:  BP 132/84   Pulse 64   Temp 98 F (36.7 C) (Temporal)   Ht 5' 5.5" (1.664 m)   Wt 113 lb 4 oz (51.4 kg)   SpO2 97%   BMI 18.56 kg/m                 Constitutional: Pt appears in NAD               HENT: Head: NCAT.                Right Ear: External ear normal.                 Left Ear: External ear normal.                Eyes: . Pupils are equal, round, and reactive to light.  Conjunctivae and EOM are normal               Nose: without d/c or deformity  Neck: Neck supple. Gross normal ROM               Cardiovascular: Normal rate and regular rhythm.                 Pulmonary/Chest: Effort normal and breath sounds decreased with diffuse rales left chest, much less on right, no wheezing.                Abd:  Soft, NT, ND, + BS, no organomegaly but has tender right lower anterolat rib tenderness as well               Neurological: Pt is alert. At baseline orientation, motor grossly intact               Skin: Skin is warm. No rashes, no other new lesions, LE edema - none               Psychiatric: Pt behavior is normal without agitation   Micro: none  Cardiac tracings I have personally interpreted today:  none  Pertinent Radiological findings (summarize): none   Lab Results  Component Value Date   WBC 7.8 01/01/2024   HGB 14.7 01/01/2024   HCT 44.1 01/01/2024   PLT 236.0 01/01/2024   GLUCOSE 95 01/01/2024   CHOL 179 01/01/2024   TRIG 63.0 01/01/2024   HDL 75.40 01/01/2024   LDLDIRECT 124.4 11/14/2013   LDLCALC 91 01/01/2024   ALT 21 01/01/2024   AST 25 01/01/2024   NA 140 01/01/2024   K 4.8 01/01/2024   CL 101 01/01/2024   CREATININE 0.95 01/01/2024   BUN 21 01/01/2024   CO2 29 01/01/2024   TSH 4.92 01/01/2024   INR 0.9 02/15/2021   HGBA1C 6.3 01/01/2024   Assessment/Plan:  ANYRA KAUFMAN is a 78 y.o. White or Caucasian [1] female with  has a past medical history of Breast cancer (HCC) (11/12/2012), Bronchiectasis, Chronic fatigue fibromyalgia syndrome (11/12/2012), COPD (chronic obstructive pulmonary disease) (HCC), DVT, lower extremity (HCC), Fibromyalgia, GERD (gastroesophageal reflux disease), History of shingles, Hyperlipidemia, Hypertension, Impaired glucose tolerance (11/14/2013), Migraine, Osteopenia, Osteoporosis (10/16/2007), Pulmonary Mycobacterium avium complex (MAC) infection (HCC), Squamous cell skin cancer, multiple sites, and  Vaccine counseling (01/18/2022).  Rib pain on right side No falls or trauma, likely due to msk strain with coughing, declines cxr or change in cough med,  to f/u any worsening symptoms or concerns   Osteoporosis Pt for Prolia  shot today  Elevated glucose Lab Results  Component Value Date   HGBA1C 6.3 01/01/2024   Stable, pt to continue current medical treatment  - diet, wt control   Essential hypertension BP Readings from Last 3 Encounters:  04/02/24 132/84  03/13/24 120/62  02/18/24 110/68   Mildly uncontrolled, pt declines change today,  pt to continue medical treatment norvasc  5 every day, lopressor  12.5 bid, benicar  hct 20 - 12.5 mg qd  Followup: Return in about 4 months (around 08/02/2024), or if symptoms worsen or fail to improve.  Rosalia Colonel, MD 04/02/2024 1:01 PM  Medical Group  Primary [facility] - [facility] Internal Medicine

## 2024-04-02 NOTE — Assessment & Plan Note (Signed)
 BP Readings from Last 3 Encounters:  04/02/24 132/84  03/13/24 120/62  02/18/24 110/68   Mildly uncontrolled, pt declines change today,  pt to continue medical treatment norvasc  5 every day, lopressor  12.5 bid, benicar  hct 20 - 12.5 mg qd

## 2024-04-22 ENCOUNTER — Encounter (HOSPITAL_BASED_OUTPATIENT_CLINIC_OR_DEPARTMENT_OTHER): Payer: Self-pay | Admitting: Pulmonary Disease

## 2024-04-22 DIAGNOSIS — R911 Solitary pulmonary nodule: Secondary | ICD-10-CM

## 2024-04-22 NOTE — Telephone Encounter (Signed)
 Can we change location for pt?

## 2024-04-22 NOTE — Telephone Encounter (Signed)
 Does pt need repeat CT?

## 2024-04-23 NOTE — Telephone Encounter (Signed)
 Order placed it looks like per [facility]

## 2024-04-23 NOTE — Telephone Encounter (Signed)
 6 month FU CT chest wo con ordered as per previous phone note 02/2024

## 2024-04-25 NOTE — Telephone Encounter (Signed)
 Prolia VOB resubmitted via [email or web address]

## 2024-04-25 NOTE — Telephone Encounter (Signed)
 Pt ready for scheduling for PROLIA  on or after : 04/25/24   Option# 1: Buy/Bill (Office supplied medication)   Out-of-pocket cost due at time of clinic visit: $0   Number of injection/visits approved: ---   Primary: MEDICARE Prolia  co-insurance: 0% Admin fee co-insurance: 0%   Secondary: [employer]MED[place] Prolia  co-insurance: Covers the Medicare Part B co-insurance and 100% of the excess charges. This plan does not cover the Medicare Part B deductible. Admin fee co-insurance:    Medical Benefit Details: Date Benefits were checked: 02/26/24 Deductible: $257 Met of $257 Required/ Coinsurance: 0%/ Admin Fee: 0%   Prior Auth: N/A PA# Expiration Date:   # of doses approved: ----------------------------------------------------------------------- Option# 2- Med Obtained from pharmacy:   Pharmacy benefit: Copay $--- (Paid to pharmacy) Admin Fee: --- (Pay at clinic)   Prior Auth: N/A PA# Expiration Date:   # of doses approved:     If patient wants fill through the pharmacy benefit please send prescription to: ---, and include estimated need by date in rx notes. Pharmacy will ship medication directly to the office.   Patient NOT eligible for Prolia  Copay Card. Copay Card can make patient's cost as little as $25. Link to apply: [email or web address]://[email or web address]   ** This summary of benefits is an estimation of the patient's out-of-pocket cost. Exact cost may very based on individual plan coverage.

## 2024-05-14 NOTE — Telephone Encounter (Signed)
 Patient received injection during visit with Dr.John 04/10

## 2024-06-02 ENCOUNTER — Encounter: Payer: Self-pay | Admitting: Internal Medicine

## 2024-06-02 MED ORDER — CARISOPRODOL 350 MG PO TABS: 350.0000 mg | ORAL_TABLET | Freq: Two times a day (BID) | ORAL | 1 refills | Status: AC | PRN

## 2024-06-02 MED ORDER — TEMAZEPAM 15 MG PO CAPS: 15.0000 mg | ORAL_CAPSULE | Freq: Every day | ORAL | 1 refills | Status: DC

## 2024-06-04 ENCOUNTER — Other Ambulatory Visit: Payer: Self-pay

## 2024-06-04 MED ORDER — GABAPENTIN 100 MG PO CAPS: 100.0000 mg | ORAL_CAPSULE | Freq: Three times a day (TID) | ORAL | 1 refills | Status: AC

## 2024-07-14 ENCOUNTER — Ambulatory Visit (HOSPITAL_BASED_OUTPATIENT_CLINIC_OR_DEPARTMENT_OTHER)
Admission: RE | Admit: 2024-07-14 | Discharge: 2024-07-14 | Disposition: A | Discharge status: Home / Self Care | Source: Ambulatory Visit | Attending: Pulmonary Disease | Admitting: Pulmonary Disease

## 2024-07-14 DIAGNOSIS — R911 Solitary pulmonary nodule: Secondary | ICD-10-CM | POA: Diagnosis present

## 2024-07-15 NOTE — Telephone Encounter (Signed)
See patient MyChart message.

## 2024-07-27 ENCOUNTER — Ambulatory Visit: Payer: Self-pay | Admitting: Pulmonary Disease

## 2024-07-29 ENCOUNTER — Ambulatory Visit (INDEPENDENT_AMBULATORY_CARE_PROVIDER_SITE_OTHER): Admitting: Internal Medicine

## 2024-07-29 ENCOUNTER — Encounter: Payer: Self-pay | Admitting: Internal Medicine

## 2024-07-29 VITALS — BP 118/84 | HR 74 | Temp 97.7°F | Ht 65.5 in | Wt 113.2 lb

## 2024-07-29 DIAGNOSIS — E7849 Other hyperlipidemia: Secondary | ICD-10-CM

## 2024-07-29 DIAGNOSIS — G8929 Other chronic pain: Secondary | ICD-10-CM

## 2024-07-29 DIAGNOSIS — R7309 Other abnormal glucose: Secondary | ICD-10-CM

## 2024-07-29 DIAGNOSIS — M545 Low back pain, unspecified: Secondary | ICD-10-CM

## 2024-07-29 DIAGNOSIS — I10 Essential (primary) hypertension: Secondary | ICD-10-CM

## 2024-07-29 NOTE — Assessment & Plan Note (Signed)
 Lab Results  Component Value Date   HGBA1C 6.3 01/01/2024   Stable, pt to continue current medical treatment  - diet, wt control

## 2024-07-29 NOTE — Assessment & Plan Note (Signed)
 BP Readings from Last 3 Encounters:  07/29/24 118/84  04/02/24 132/84  03/13/24 120/62   Stable, pt to continue medical treatment norvasc  5 every day, lopressor  12.5 bid

## 2024-07-29 NOTE — Patient Instructions (Signed)
Please continue all other medications as before, and refills have been done if requested.  Please have the pharmacy call with any other refills you may need.  Please continue your efforts at being more active, low cholesterol diet, and weight control.  Please keep your appointments with your specialists as you may have planned  Please make an Appointment to return in 6 months, or sooner if needed 

## 2024-07-29 NOTE — Assessment & Plan Note (Signed)
Overall stable, cont current med tx 

## 2024-07-29 NOTE — Progress Notes (Addendum)
 Patient ID: Diane Haney, female   DOB: 01/14/1946, 78 y.o.   MRN: [id number]        Chief Complaint: follow up HTN, HLD and hyperglycemia, low back pain       HPI:  Diane Haney is a 78 y.o. female here overall doing ok.  Pt denies chest pain, increased sob or doe, wheezing, orthopnea, PND, increased LE swelling, palpitations, dizziness or syncope.   Pt denies polydipsia, polyuria, or new focal neuro s/s.    Pt denies fever, wt loss, night sweats, loss of appetite, or other constitutional symptoms  Pt continues to have recurring LBP without change in severity, bowel or bladder change, fever, wt loss, but with worsening LLE pain/numbness at times  . Has ortho f/u for this next wk. Does c/o ongoing fatigue, but denies signficant daytime hypersomnolence.         Wt Readings from Last 3 Encounters:  07/29/24 113 lb 3.2 oz (51.3 kg)  04/02/24 113 lb 4 oz (51.4 kg)  03/13/24 115 lb (52.2 kg)   BP Readings from Last 3 Encounters:  07/29/24 118/84  04/02/24 132/84  03/13/24 120/62         Past Medical History:  Diagnosis Date   Breast cancer (HCC) 11/12/2012   Dx oct 2013 - [facility] - Invasive ductal carcinoma, s/o bilat mastectomy with + margin  - also for XRT soon   Bronchiectasis    Chronic fatigue fibromyalgia syndrome 11/12/2012   COPD (chronic obstructive pulmonary disease) (HCC)    DVT, lower extremity (HCC)    LLE   Fibromyalgia    GERD (gastroesophageal reflux disease)    History of shingles    Hyperlipidemia    Hypertension    Impaired glucose tolerance 11/14/2013   Migraine    Osteopenia    Osteoporosis 10/16/2007   Qualifier: Diagnosis of  By: Norleen MD, Lynwood ORN    Pulmonary Mycobacterium avium complex (MAC) infection (HCC)    Squamous cell skin cancer, multiple sites    Vaccine counseling 01/18/2022   Past Surgical History:  Procedure Laterality Date   BRONCHIAL WASHINGS  10/17/2022   Procedure: BRONCHIAL WASHINGS;  Surgeon: Jude Harden GAILS, MD;  Location:  [facility];  Service: Cardiopulmonary;;   FOOT SURGERY     mastectomy bilateral oct 2013     shoulder impingement     VIDEO BRONCHOSCOPY N/A 05/07/2014   Procedure: VIDEO BRONCHOSCOPY WITHOUT FLUORO;  Surgeon: Belvie FORBES Silvan, MD;  Location: [facility];  Service: Cardiopulmonary;  Laterality: N/A;   VIDEO BRONCHOSCOPY N/A 10/17/2022   Procedure: VIDEO BRONCHOSCOPY WITHOUT FLUORO;  Surgeon: Jude Harden GAILS, MD;  Location: [facility];  Service: Cardiopulmonary;  Laterality: N/A;    reports that she quit smoking about 38 years ago. Her smoking use included cigarettes. She started smoking about 63 years ago. She has a 25 pack-year smoking history. She has never used smokeless tobacco. She reports that she does not drink alcohol and does not use drugs. family history includes Breast cancer in an other family member; Colon polyps in her father; Heart disease in an other family member; Hyperlipidemia in an other family member; Hypertension in an other family member; Lung cancer in her father; Other in an other family member. Allergies  Allergen Reactions   Meloxicam Anaphylaxis    Increased blood pressure    Losartan      headache   Adhesive [Tape]     Blisters   Ciprofloxacin     REACTION: tendinitis  Clarithromycin     REACTION: severe reflux   Prednisone     REACTION: irregular heartbeat, not able to sleep   Tramadol  Other (See Comments)   Current Outpatient Medications on File Prior to Visit  Medication Sig Dispense Refill   albuterol  (PROVENTIL ) (2.5 MG/3ML) 0.083% nebulizer solution USE 1 VIAL VIA NEBULIZER EVERY 6 HOURS AS NEEDED FOR WHEEZING OR SHORTNESS OF BREATH 360 mL 11   albuterol  (PROVENTIL ) (2.5 MG/3ML) 0.083% nebulizer solution Take 3 mLs (2.5 mg total) by nebulization every 6 (six) hours as needed for wheezing or shortness of breath. 360 mL 11   amLODipine  (NORVASC ) 5 MG tablet Take 1 tablet (5 mg total) by mouth daily. 90 tablet 3   aspirin 81 MG EC tablet Take 81 mg by  mouth daily.     atorvastatin  (LIPITOR) 20 MG tablet Take 1 tablet (20 mg total) by mouth daily. 90 tablet 3   azithromycin  (ZITHROMAX ) 500 MG tablet Take by mouth once at three times per week     calcium  citrate-vitamin D  (CITRACAL+D) 315-200 MG-UNIT per tablet Take 2 tablets by mouth daily.      carboxymethylcellulose (REFRESH PLUS) 0.5 % SOLN Place 1 drop into both eyes 2 (two) times daily as needed (dry eyes).     carisoprodol  (SOMA ) 350 MG tablet Take 1 tablet (350 mg total) by mouth 2 (two) times daily as needed for muscle spasms. 180 tablet 1   denosumab  (PROLIA ) 60 MG/ML SOSY injection Inject 60 mg into the skin every 6 (six) months. 3 mL 11   ethambutol  (MYAMBUTOL ) 100 MG tablet Take 3 tabs by mouth at three times per week 90 tablet 0   gabapentin  (NEURONTIN ) 100 MG capsule Take 1 capsule (100 mg total) by mouth 3 (three) times daily. 270 capsule 1   metoprolol  tartrate (LOPRESSOR ) 25 MG tablet TAKE 1/2 TABLET(12.5 MG) BY MOUTH TWICE DAILY 90 tablet 3   olmesartan -hydrochlorothiazide (BENICAR  HCT) 20-12.5 MG tablet Take 0.5 tablets by mouth daily. 45 tablet 3   promethazine -dextromethorphan (PROMETHAZINE -DM) 6.25-15 MG/5ML syrup Take 5 mLs by mouth 4 (four) times daily as needed. 473 mL 0   Respiratory Therapy Supplies (FLUTTER) DEVI Use as directed. (Patient taking differently: Use as directed.  Aerobika.  Has a smart vest as well.) 1 each 0   rifampin  (RIFADIN ) 300 MG capsule Take 2 tabs by mouth three times per week     Sodium Chloride , Inhalant, 7 % NEBU Inhale 3 mLs into the lungs 2 (two) times daily.     temazepam  (RESTORIL ) 15 MG capsule Take 1 capsule (15 mg total) by mouth at bedtime. 90 capsule 1   Current Facility-Administered Medications on File Prior to Visit  Medication Dose Route Frequency Provider Last Rate Last Admin   denosumab  (PROLIA ) injection 60 mg  60 mg Subcutaneous Once Norleen Lynwood ORN, MD            ROS:  All others reviewed and negative.  Objective         PE:  BP 118/84   Pulse 74   Temp 97.7 F (36.5 C) (Oral)   Ht 5' 5.5 (1.664 m)   Wt 113 lb 3.2 oz (51.3 kg)   SpO2 93%   BMI 18.55 kg/m                 Constitutional: Pt appears in NAD               HENT: Head: NCAT.  Right Ear: External ear normal.                 Left Ear: External ear normal.                Eyes: . Pupils are equal, round, and reactive to light. Conjunctivae and EOM are normal               Nose: without d/c or deformity               Neck: Neck supple. Gross normal ROM               Cardiovascular: Normal rate and regular rhythm.                 Pulmonary/Chest: Effort normal and breath sounds without rales or wheezing.                Abd:  Soft, NT, ND, + BS, no organomegaly               Neurological: Pt is alert. At baseline orientation, motor grossly intact               Skin: Skin is warm. No rashes, no other new lesions, LE edema - none               Psychiatric: Pt behavior is normal without agitation   Micro: none  Cardiac tracings I have personally interpreted today:  none  Pertinent Radiological findings (summarize): none   Lab Results  Component Value Date   WBC 7.8 01/01/2024   HGB 14.7 01/01/2024   HCT 44.1 01/01/2024   PLT 236.0 01/01/2024   GLUCOSE 95 01/01/2024   CHOL 179 01/01/2024   TRIG 63.0 01/01/2024   HDL 75.40 01/01/2024   LDLDIRECT 124.4 11/14/2013   LDLCALC 91 01/01/2024   ALT 21 01/01/2024   AST 25 01/01/2024   NA 140 01/01/2024   K 4.8 01/01/2024   CL 101 01/01/2024   CREATININE 0.95 01/01/2024   BUN 21 01/01/2024   CO2 29 01/01/2024   TSH 4.92 01/01/2024   INR 0.9 02/15/2021   HGBA1C 6.3 01/01/2024   Assessment/Plan:  Diane Haney is a 78 y.o. 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  Hyperlipidemia Lab Results  Component Value Date   LDLCALC 91 01/01/2024   uncontrolled, pt to continue current statin lipitor 20 mg every day and lower chol diet, declines other change today   Essential hypertension BP Readings from Last 3 Encounters:  07/29/24 118/84  04/02/24 132/84  03/13/24 120/62   Stable, pt to continue medical treatment norvasc  5 every day, lopressor  12.5 bid   Elevated glucose Lab Results  Component Value Date   HGBA1C 6.3 01/01/2024   Stable, pt to continue current medical treatment  - diet, wt control   Chronic low back pain Overall stable, cont current med tx  Followup: Return in about 6 months (around 01/29/2025).  Lynwood Rush, MD 07/29/2024 8:45 PM [facility]

## 2024-07-29 NOTE — Assessment & Plan Note (Signed)
 Lab Results  Component Value Date   LDLCALC 91 01/01/2024   uncontrolled, pt to continue current statin lipitor 20 mg every day and lower chol diet, declines other change today

## 2024-08-04 ENCOUNTER — Ambulatory Visit: Admitting: Internal Medicine

## 2024-08-05 ENCOUNTER — Ambulatory Visit: Admitting: Internal Medicine

## 2024-08-07 ENCOUNTER — Ambulatory Visit (HOSPITAL_BASED_OUTPATIENT_CLINIC_OR_DEPARTMENT_OTHER): Admitting: Adult Health

## 2024-08-28 ENCOUNTER — Ambulatory Visit (HOSPITAL_BASED_OUTPATIENT_CLINIC_OR_DEPARTMENT_OTHER): Admitting: Pulmonary Disease

## 2024-08-28 ENCOUNTER — Encounter (HOSPITAL_BASED_OUTPATIENT_CLINIC_OR_DEPARTMENT_OTHER): Payer: Self-pay | Admitting: Pulmonary Disease

## 2024-08-28 VITALS — BP 128/72 | HR 64 | Ht 65.5 in | Wt 112.4 lb

## 2024-08-28 DIAGNOSIS — J479 Bronchiectasis, uncomplicated: Secondary | ICD-10-CM | POA: Diagnosis not present

## 2024-08-28 DIAGNOSIS — A310 Pulmonary mycobacterial infection: Secondary | ICD-10-CM

## 2024-08-28 DIAGNOSIS — Z23 Encounter for immunization: Secondary | ICD-10-CM

## 2024-08-28 NOTE — Patient Instructions (Addendum)
 X flu shot   X LFTs q 3months @ [facility] -next on December  Eye exam every 6 months    VISIT SUMMARY: During your visit, we reviewed your ongoing treatment for bronchiectasis and Mycobacterium avium complex (MAC) infection. We discussed your current medications, recent test results, and your symptoms, including fatigue and shortness of breath. We also addressed your rib pain and reviewed your EKG results.  YOUR PLAN: -BRONCHIECTASIS WITH MYCOBACTERIUM AVIUM COMPLEX (MAC) PULMONARY INFECTION AND PULMONARY FIBROSIS: Bronchiectasis is a condition where the airways in your lungs are damaged, leading to mucus build-up and infections. You are being treated for a MAC infection, a type of lung infection, with azithromycin , ethambutol , and rifampin . Your recent CT scan showed improvement, and your lung function tests remain stable. Continue taking your medications three times a week, monitor your liver function every three months, and schedule eye exams every six months. We will reassess your treatment duration in March 2026.  -FATIGUE AND SHORTNESS OF BREATH: Your increased fatigue is likely related to your MAC treatment. You experience shortness of breath with activities like climbing stairs, which improves with deep breathing. Your lung function remains stable.  -RIB FRACTURES DUE TO COUGHING: You have tenderness from previous rib fractures caused by coughing. This pain is more noticeable during certain movements.  -RIGHT ATRIAL ENLARGEMENT: Your EKG shows right atrial enlargement, which means the right upper chamber of your heart is larger than normal. This finding is consistent with previous results and does not raise any new concerns.  INSTRUCTIONS: Continue your current medications and follow the monitoring schedule for liver function tests and eye exams. We will reassess your treatment in March 2026. If you experience any new or worsening symptoms, please contact our  office.                      Contains text generated by Abridge.                                 Contains text generated by Abridge.

## 2024-08-28 NOTE — Progress Notes (Signed)
 Subjective:    Patient ID: Diane Haney, female    DOB: 06-02-1946, 78 y.o.   MRN: [id number]  78  yo ex-smoker with bronchiectasis and MAI  She smoked about 20 pack years before she quit in her early 21s She has been treated 3 times for MAC.  Last treatment was  2014 -2016 (azithromycin  rifampin  and ethambutol .)  MAI was redemonstrated in sputum in 2022 & agaib on bronch in 2023  02/2024 started thrice weekly azithromycin , ethambutol , rifampin     5/ 2005 Bronchoscopy >> MAI (S- clarithro, eth, RIF 8.0, synergy positive for E/R) and fortuitum (S- cipro/smikacin/tigecycline).She began antibiotics May 2005 and was treated with ETH/Azithro and cipro until March 2007.   10/2022 bscopy >> MAC     PMH -  stage I moderately differentiated invasive ductal carcinoma of the right breast s/p RT.  Idiopathic LLE DVT (Dx at age 39) sensorineural hearing loss in both ears     Meds - did not tolerate anoro- due to tremors and palpitations   spiriva  02/2021 did not help, now on incruse   Lung function-FEV1 dropped from 77% in 2021 to 66% in 2023 and 63%in 2024.  FVC and TLC are maintained, DLCO also dropped from 74% to 62% . HRCT shows waxing and waning nodularity, bronchiectasis appears stable.  she is unable to tolerate bronchodilator therapy due to tremors and palpitation. She is not able to tolerate a vest but does use flutter valve and hypertonic saline.     Discussed the use of AI scribe software for clinical note transcription with the patient, who gave verbal consent to proceed.  History of Present Illness  Discussed the use of AI scribe software for clinical note transcription with the patient, who gave verbal consent to proceed.  History of Present Illness   Diane Haney is a 78 year old female with bronchiectasis and MAC infection who presents for follow-up.  She is undergoing treatment for Mycobacterium avium complex (MAC) infection with azithromycin , ethambutol , and rifampin  since  March 2025. A CT chest in August 2025 showed improvement in the left upper lobe and a reduction in nodular size, though scarring persists. Pulmonary function tests remain unchanged, and sputum samples are consistently negative despite difficulty in production. She experiences increased fatigue and occasional queasiness after medication, managed with ginger chews. Liver function tests are monitored regularly, and a hearing test is scheduled.  She manages bronchiectasis with a nebulizer and flutter valve for airway clearance, avoiding a vest due to back problems and mastectomy pain syndrome. Inhalers previously caused tremors. Lung function is stable at 63%. She uses 7% saline for nebulization. An EKG shows stable right atrial enlargement since March 2025, consistent with findings from 2022.  She has tenderness from previous rib fractures due to coughing, particularly when lying down. She experiences shortness of breath with exertion, such as climbing stairs or walking to the mailbox, which resolves with deep breathing. She maintains an exercise routine with an exercise bike and treadmill four times a week.         Significant tests/ events reviewed   07/2024 HRCT chest >> LUL looks better , unchanged prior changes of bronchiectasis  07/2023 HRCT chest  Cylindrical bronchiectasis with slight waxing and waning nodularity 10/2022 HRCT chest >> stable bronchiectasis and tree-in-bud opacities 12/2021 HRCT >> previous groundglass in the upper lobes has resolved, unchanged bronchiectasis of right apex, right middle lobe and lingula.  Clustered tree-in-bud nodularity in bilateral lung bases new from prior  11/2020 HRCt >> Patchy bilateral bronchiectasis, volume loss, peribronchovascular nodularity and scattered mucoid impaction, mildly progressive from 11/24/2019   11/24/2019-CT chest without contrast-chronic changes of MAC,  stable mild emphysematous changes, areas of bronchiectasis and chronic  atelectasis, larger areas of nodularity in the right lung seen on the prior study have resolved and there are new areas of vague nodularity.   HRCT Chest 08/2018 >> stable compared to 2018   CT 01/2015>Interval slight progression in right apical consolidation with air bronchograms, probably progression of radiation fibrosis. The left apical component is unchanged.   - stable chronic lung disease with bronchiectasis,scarring and peribronchial nodularity     08/2024 PFT >> FEV1 1.32/65%, FVC 2.3/85% , ratio 58  unchanged  08/2023 PFTs >> moderate airway obstruction, decreased FEV1 1.46/63%, slight drop in DLCO 12.4/62%   05/2022 spirometry moderate airway obstruction, ratio 65, FEV1 66%, FVC 76%, DLCO 71%/14.04    05/2020 PFTs  mild restriction with mild diffusion defect/ FEV1 1.81 (77%), ratio 73, DLCO 15.53 (74%) >> drop in FEV1 from 2.16-1.81 and drop in FVC from 2.71-2.48    09/2017  PFT-stable with FEV1 94% , ratio 76 , FVC 94%   2011 spirometry normal    Review of Systems  neg for any significant sore throat, dysphagia, itching, sneezing, nasal congestion or excess/ purulent secretions, fever, chills, sweats, unintended wt loss, pleuritic or exertional cp, hempoptysis, orthopnea pnd or change in chronic leg swelling. Also denies presyncope, palpitations, heartburn, abdominal pain, nausea, vomiting, diarrhea or change in bowel or urinary habits, dysuria,hematuria, rash, arthralgias, visual complaints, headache, numbness weakness or ataxia.      Objective:   Physical Exam  Gen. Pleasant, well-nourished, in no distress ENT - no thrush, no pallor/icterus,no post nasal drip Neck: No JVD, no thyromegaly, no carotid bruits Lungs: no use of accessory muscles, no dullness to percussion, clear without rales or rhonchi  Cardiovascular: Rhythm regular, heart sounds  normal, no murmurs or gallops, no peripheral edema Musculoskeletal: No deformities, no cyanosis or clubbing        Assessment &  Plan:   Assessment and Plan Assessment & Plan   Assessment and Plan    Bronchiectasis with Mycobacterium avium complex (MAC) pulmonary infection and pulmonary fibrosis Bronchiectasis with MAC infection and pulmonary fibrosis is managed with azithromycin , ethambutol , and rifampin  since March 2025. Recent CT chest in August 2025 showed resolution of consolidation in the left upper lobe and stable bronchiectasis and fibrosis. PFTs remain unchanged. She reports increased fatigue and occasional queasiness, managed with ginger chews. Liver function tests are monitored regularly. Treatment duration is planned for 12 to 18 months, depending on tolerance and clinical response. - Continue azithromycin , ethambutol , and rifampin  three times a week - Monitor liver function tests every three months - Schedule eye exams every six months - Reassess treatment duration in March 2026, considering extension to 18 months if tolerated  Fatigue and shortness of breath Increased fatigue likely related to MAC treatment. Experiences exertional dyspnea, such as climbing stairs or walking to the mailbox, resolving with deep breathing. Lung function is stable at 63% over the past year.  Rib fractures due to coughing Rib fractures due to coughing with residual tenderness, especially during certain movements.  Right atrial enlargement EKG shows right atrial enlargement consistent with previous findings from 2022. No acute concerns were raised during the visit.

## 2024-09-02 ENCOUNTER — Other Ambulatory Visit (HOSPITAL_COMMUNITY): Payer: Self-pay

## 2024-09-02 ENCOUNTER — Telehealth: Payer: Self-pay

## 2024-09-02 NOTE — Telephone Encounter (Signed)
 SABRA

## 2024-09-02 NOTE — Telephone Encounter (Signed)
 Pt ready for scheduling for PROLIA  on or after : 10/02/24  Option# 1: Buy/Bill (Office supplied medication)  Out-of-pocket cost due at time of clinic visit: $0  Number of injection/visits approved: ---  Primary: MEDICARE Prolia  co-insurance: 0% Admin fee co-insurance: 0%  Secondary: [employer]MED[employer] Prolia  co-insurance:  Admin fee co-insurance:   Medical Benefit Details: Date Benefits were checked: 09/02/24 Deductible: $257 Met of $257 Required/ Coinsurance: 0%/ Admin Fee: 0%  Prior Auth: N/A PA# Expiration Date:   # of doses approved: ----------------------------------------------------------------------- Option# 2- Med Obtained from pharmacy:  Pharmacy benefit: Copay $653.89 (Paid to pharmacy) Admin Fee: 0% (Pay at clinic)  Prior Auth: N/A PA# Expiration Date:   # of doses approved:   If patient wants fill through the pharmacy benefit please send prescription to: [facility], and include estimated need by date in rx notes. Pharmacy will ship medication directly to the office.  Patient NOT eligible for Prolia  Copay Card. Copay Card can make patient's cost as little as $25. Link to apply: [email or web address]://[email or web address]  ** This summary of benefits is an estimation of the patient's out-of-pocket cost. Exact cost may very based on individual plan coverage.

## 2024-09-02 NOTE — Telephone Encounter (Signed)
 Prolia  VOB initiated via [email or web address]  Next Prolia  inj DUE: 10/02/24

## 2024-09-22 DIAGNOSIS — M25611 Stiffness of right shoulder, not elsewhere classified: Secondary | ICD-10-CM | POA: Insufficient documentation

## 2024-10-06 ENCOUNTER — Ambulatory Visit

## 2024-10-06 DIAGNOSIS — M810 Age-related osteoporosis without current pathological fracture: Secondary | ICD-10-CM | POA: Diagnosis not present

## 2024-10-06 MED ORDER — DENOSUMAB 60 MG/ML ~~LOC~~ SOSY: 60.0000 mg | PREFILLED_SYRINGE | SUBCUTANEOUS | Status: AC

## 2024-10-06 MED ORDER — DENOSUMAB 60 MG/ML ~~LOC~~ SOSY
60.0000 mg | PREFILLED_SYRINGE | Freq: Once | SUBCUTANEOUS | Status: AC
  Administered 2024-10-06: 60 mg via SUBCUTANEOUS

## 2024-10-06 NOTE — Progress Notes (Signed)
Pt was given Prolia injection with no complications.

## 2024-11-11 ENCOUNTER — Other Ambulatory Visit (HOSPITAL_BASED_OUTPATIENT_CLINIC_OR_DEPARTMENT_OTHER): Payer: Self-pay

## 2024-11-11 ENCOUNTER — Telehealth (HOSPITAL_BASED_OUTPATIENT_CLINIC_OR_DEPARTMENT_OTHER): Payer: Self-pay

## 2024-11-11 DIAGNOSIS — A310 Pulmonary mycobacterial infection: Secondary | ICD-10-CM

## 2024-11-11 NOTE — Telephone Encounter (Signed)
 Orders have been released on our end . I have also faxed to [facility] at [phone] confirmation received     Copied from CRM #[id number]. Topic: Clinical - Lab/Test Results >> Nov 11, 2024 10:12 AM Rozanna MATSU wrote: Reason for CRM: pt called stated [facility] advised her they do not have the lab orders from Dr. Jude. Please contact pt and advise, stated she would lke this done before the end of the year.

## 2024-11-13 ENCOUNTER — Ambulatory Visit: Payer: Self-pay | Admitting: Pulmonary Disease

## 2024-11-13 ENCOUNTER — Telehealth: Payer: Self-pay

## 2024-11-13 LAB — HEPATIC FUNCTION PANEL
ALT: 14 IU/L (ref 0–32)
AST: 26 IU/L (ref 0–40)
Albumin: 4.4 g/dL (ref 3.8–4.8)
Alkaline Phosphatase: 48 IU/L — ABNORMAL LOW (ref 49–135)
Bilirubin Total: 0.3 mg/dL (ref 0.0–1.2)
Bilirubin, Direct: 0.11 mg/dL (ref 0.00–0.40)
Total Protein: 7.5 g/dL (ref 6.0–8.5)

## 2024-11-13 NOTE — Telephone Encounter (Signed)
 Copied from CRM #[id number]. Topic: Clinical - Medication Question >> Nov 13, 2024 10:04 AM Charolett L wrote: Reason for CRM: Patient is calling to schedule her prolia  shot for early may

## 2024-11-14 NOTE — Telephone Encounter (Signed)
 A new PA will have to be sent for the Prolia  upon the new year. Once this has been sent and we get the approval from pts insurance then we can schedule pt for MAY as requested.

## 2024-11-24 ENCOUNTER — Other Ambulatory Visit: Payer: Self-pay | Admitting: Internal Medicine

## 2024-12-19 NOTE — Progress Notes (Signed)
 " NEW PATIENT CLINIC NOTE - LOW BACK PAIN    SUBJECTIVE Diane Haney is a 79 y.o. female who presents to clinic with low back pain.   History of Present Illness The patient presents for evaluation of low back pain.  Symptoms have been present for over 6 months, initially starting with a severe episode that gradually subsided, leaving persistent leg aches. Pain is more pronounced at night, often disrupting sleep, and radiates down the legs to the feet. The pain affects the outer side of the left foot, including the little toe, and the middle of the right ankle. Occasionally, discomfort is felt in the back of the legs before bedtime. No numbness is reported in the legs. Daytime pain is manageable and primarily localized to the back. A history of fibromyalgia and chronic fatigue syndrome has resulted in muscle weakness. Previous treatments include trigger point injections for fibromyalgia.   Patient denies fevers, chills, loss of bowel and bladder control, saddle anesthesia, unintentional weight loss, night sweats.    Patient was in the office with symptoms of persistent lumbar radiculopathy causing low back pain and bilateral lower extremity symptoms including numbness and pain.  She had difficulty sleeping at night and her symptoms are primarily in the L5 and S1 pattern.  Bilateral L5 SNRB 10/07/2024 provided about a 90% relief of radicular symptoms in both lower extremities.  She is also found a significant improvement in her sleep and symptoms by increasing her gabapentin  to 600 mg before bed.  She needs a refill of this medication today.  No new falls or injuries.  Reevaluation and is having some increasing lumbar discomfort but without her typical sciatic symptoms.  Is mostly in the left lower lumbar region but somewhat bilateral more towards the buttock.  She also feels like she pulled a muscle in her right chest wall doing exercises and activity that her postmastectomy recommended.  She is  not having any obvious radicular symptoms however.  No new falls or injuries.  No fevers chills or shakes. No red flag symptoms including bowel or bladder dysfunction.  OBJECTIVE  There were no vitals filed for this visit.   Physical Exam   Inspection: Gait is mildly compensated forward flexed with decreased step length and cadence.  no erythema, edema, or ecchymosis noted over the lower lumbar spine Palpation: Tenderness to palpation over the bilateral lumbosacral paraspinals, left greater than right Range of Motion:  -- Flexion and extension limited by 25% with pain at end ranges Strength: -- Bilateral lower extremity strength grossly 4+ throughout with giveaway weakness Sensation:  -- Intact to light touch in the bilateral L2-S2 dermatomes Special Tests: -- Negative Straight Leg Raise Test bilaterally   Imaging: Lumbar spine MRI(08/21/24):  1.  Multilevel degenerative disc disease with moderate spinal canal stenosis at L4-L5 level and mild spinal canal stenosis at T12-L1, L1-L2, L3-L4 and L5-S1 levels.  2.  Multilevel bilateral mild neural foraminal narrowing as described above.  Results     ASSESSMENT and PLAN: Diane Haney is a 79 y.o. female who presents to clinic with chronic worsening low back pain with symptoms into the bilateral lower extremities   Lumbar degenerative disc disease and lumbar radiculopathy  Assessment & Plan 1. Low back pain with bilateral lower extremity radiculopathy: Again, the patient has had good improvement following her bilateral L5 SNRB's that were done about 2 months ago in terms of her distal radicular symptoms.  She is having some local lumbar discomfort perhaps mildly related to some facet  mediated disease versus some myofascial pain.  I offered her local trigger point injection to the areas of maximal tenderness today but she declines.  She does not feel relates quite gotten back to that point. Sometimes her symptoms do come into her  anterior thighs but is intermittent, again she also has some mild disc bulging there but have not been symptomatic enough to consider more proximal SNRB.  In terms of her chest wall pain, I recommended treating this somewhat conservatively with local heat and ice but if it is persisting she should consider returning back to her chest surgeon for evaluation.  She is agreeable.  Continue current medications, although we did discuss increasing her gabapentin  to 300 mg in the daytime and 600 mg at night.  She is concerned she would have daytime fatigue and sleepiness however.  She can use this at her discretion.  No refills needed today.  Routine reevaluation in 6 weeks and sooner if needed.  Again consideration can be made for trigger point injection in the lumbar region if her pain is persisting there locally.  If she is having recurrence of radicular symptoms I recommend repeating her bilateral L5.    Documentation for time-based billing:  Total time spent of date of service was 20 minutes.  Patient care activities included preparing to see the patient such as reviewing the patient record, obtaining and/or reviewing separately obtained history, performing a medically appropriate history and physical examination, counseling and educating the patient, family, and/or caregiver, ordering prescription medications, tests, or procedures, and documenting clinical information in the electronic or other health record.  "

## 2024-12-24 ENCOUNTER — Other Ambulatory Visit: Payer: Self-pay

## 2024-12-24 ENCOUNTER — Emergency Department (HOSPITAL_BASED_OUTPATIENT_CLINIC_OR_DEPARTMENT_OTHER)

## 2024-12-24 ENCOUNTER — Encounter (HOSPITAL_BASED_OUTPATIENT_CLINIC_OR_DEPARTMENT_OTHER): Payer: Self-pay

## 2024-12-24 ENCOUNTER — Emergency Department (HOSPITAL_BASED_OUTPATIENT_CLINIC_OR_DEPARTMENT_OTHER)
Admission: EM | Admit: 2024-12-24 | Discharge: 2024-12-24 | Disposition: A | Discharge status: Home / Self Care | Attending: Emergency Medicine | Admitting: Emergency Medicine

## 2024-12-24 DIAGNOSIS — Z7982 Long term (current) use of aspirin: Secondary | ICD-10-CM | POA: Insufficient documentation

## 2024-12-24 DIAGNOSIS — R079 Chest pain, unspecified: Secondary | ICD-10-CM | POA: Diagnosis present

## 2024-12-24 DIAGNOSIS — J449 Chronic obstructive pulmonary disease, unspecified: Secondary | ICD-10-CM | POA: Insufficient documentation

## 2024-12-24 DIAGNOSIS — Z79899 Other long term (current) drug therapy: Secondary | ICD-10-CM | POA: Insufficient documentation

## 2024-12-24 DIAGNOSIS — Z853 Personal history of malignant neoplasm of breast: Secondary | ICD-10-CM | POA: Insufficient documentation

## 2024-12-24 DIAGNOSIS — R0609 Other forms of dyspnea: Secondary | ICD-10-CM | POA: Diagnosis not present

## 2024-12-24 DIAGNOSIS — I10 Essential (primary) hypertension: Secondary | ICD-10-CM | POA: Insufficient documentation

## 2024-12-24 DIAGNOSIS — Z85828 Personal history of other malignant neoplasm of skin: Secondary | ICD-10-CM | POA: Diagnosis not present

## 2024-12-24 DIAGNOSIS — R0789 Other chest pain: Secondary | ICD-10-CM | POA: Insufficient documentation

## 2024-12-24 DIAGNOSIS — R053 Chronic cough: Secondary | ICD-10-CM | POA: Insufficient documentation

## 2024-12-24 LAB — COMPREHENSIVE METABOLIC PANEL WITH GFR
ALT: 16 U/L (ref 0–44)
AST: 27 U/L (ref 15–41)
Albumin: 4.6 g/dL (ref 3.5–5.0)
Alkaline Phosphatase: 45 U/L (ref 38–126)
Anion gap: 13 (ref 5–15)
BUN: 15 mg/dL (ref 8–23)
CO2: 26 mmol/L (ref 22–32)
Calcium: 10.1 mg/dL (ref 8.9–10.3)
Chloride: 101 mmol/L (ref 98–111)
Creatinine, Ser: 0.88 mg/dL (ref 0.44–1.00)
GFR, Estimated: >60 mL/min
Glucose, Bld: 94 mg/dL (ref 70–99)
Potassium: 4.2 mmol/L (ref 3.5–5.1)
Sodium: 140 mmol/L (ref 135–145)
Total Bilirubin: 0.4 mg/dL (ref 0.0–1.2)
Total Protein: 8 g/dL (ref 6.5–8.1)

## 2024-12-24 LAB — CBC WITH DIFFERENTIAL/PLATELET
Abs Immature Granulocytes: 0.02 K/uL (ref 0.00–0.07)
Basophils Absolute: 0 K/uL (ref 0.0–0.1)
Basophils Relative: 1 %
Eosinophils Absolute: 0.1 K/uL (ref 0.0–0.5)
Eosinophils Relative: 2 %
HCT: 45.4 % (ref 36.0–46.0)
Hemoglobin: 15.1 g/dL — ABNORMAL HIGH (ref 12.0–15.0)
Immature Granulocytes: 0 %
Lymphocytes Relative: 18 %
Lymphs Abs: 1.5 K/uL (ref 0.7–4.0)
MCH: 31.9 pg (ref 26.0–34.0)
MCHC: 33.3 g/dL (ref 30.0–36.0)
MCV: 96 fL (ref 80.0–100.0)
Monocytes Absolute: 1 K/uL (ref 0.1–1.0)
Monocytes Relative: 13 %
Neutro Abs: 5.4 K/uL (ref 1.7–7.7)
Neutrophils Relative %: 66 %
Platelets: 237 K/uL (ref 150–400)
RBC: 4.73 MIL/uL (ref 3.87–5.11)
RDW: 13.2 % (ref 11.5–15.5)
WBC: 8.1 K/uL (ref 4.0–10.5)
nRBC: 0 % (ref 0.0–0.2)

## 2024-12-24 LAB — RESP PANEL BY RT-PCR (RSV, FLU A&B, COVID)  RVPGX2
Influenza A by PCR: NEGATIVE
Influenza B by PCR: NEGATIVE
Resp Syncytial Virus by PCR: NEGATIVE
SARS Coronavirus 2 by RT PCR: NEGATIVE

## 2024-12-24 LAB — TROPONIN T, HIGH SENSITIVITY
Troponin T High Sensitivity: <15 ng/L (ref 0–19)
Troponin T High Sensitivity: <15 ng/L (ref 0–19)

## 2024-12-24 LAB — PRO BRAIN NATRIURETIC PEPTIDE: Pro Brain Natriuretic Peptide: 176 pg/mL

## 2024-12-24 MED ORDER — ACETAMINOPHEN 325 MG PO TABS: 650.0000 mg | ORAL_TABLET | Freq: Four times a day (QID) | ORAL | 0 refills | Status: AC | PRN

## 2024-12-24 MED ORDER — IBUPROFEN 600 MG PO TABS: 600.0000 mg | ORAL_TABLET | Freq: Four times a day (QID) | ORAL | 0 refills | Status: AC | PRN

## 2024-12-24 MED ORDER — IOHEXOL 350 MG/ML SOLN
100.0000 mL | Freq: Once | INTRAVENOUS | Status: AC | PRN
  Administered 2024-12-24: 75 mL via INTRAVENOUS

## 2024-12-24 MED ORDER — LIDOCAINE 5 % EX PTCH: 1.0000 | MEDICATED_PATCH | Freq: Every day | CUTANEOUS | 0 refills | Status: AC | PRN

## 2024-12-24 MED ORDER — IBUPROFEN 400 MG PO TABS
600.0000 mg | ORAL_TABLET | Freq: Once | ORAL | Status: AC
  Administered 2024-12-24: 600 mg via ORAL
  Filled 2024-12-24: qty 1

## 2024-12-24 MED ORDER — ACETAMINOPHEN 500 MG PO TABS
1000.0000 mg | ORAL_TABLET | Freq: Once | ORAL | Status: AC
  Administered 2024-12-24: 1000 mg via ORAL
  Filled 2024-12-24: qty 2

## 2024-12-24 NOTE — Discharge Instructions (Addendum)
 It was a pleasure caring for you today in the emergency department.  Please continue to use your flutter valve and cough medication, muscle relaxer to help with your chest wall pain. Please follow up with your primary care doctor in the next week.   Consider drinking a warm beverage with a spoonful of honey to help with your cough twice daily  Return to the Emergency Department if you have unusual chest pain, pressure, or discomfort, shortness of breath, nausea, vomiting, burping, heartburn, tingling upper body parts, sweating, cold, clammy skin, or racing heartbeat. Call 911 if you think you are having a heart attack. Take all cardiac medications as prescribed - notify your doctor if you have any side effects. Follow cardiac diet - avoid fatty & fried foods, don't eat too much red meat, eat lots of fruits & vegetables, and dairy products should be low fat. Please lose weight if you are overweight. Become more active with walking, gardening, or any other activity that gets you to moving.   Please return to the emergency department immediately for any new or concerning symptoms, or if you get worse.

## 2024-12-24 NOTE — ED Provider Notes (Signed)
 " [place] [facility] Provider Note  CSN: [id number] Arrival date & time: 12/24/24 9196  Chief Complaint(s) Chest Pain  HPI Diane Haney is a 79 y.o. female with past medical history as below, significant for breast cancer status postmastectomy bilateral, COPD, DVT, fibromyalgia, MAC infection who presents to the ED with complaint of right-sided chest pain, cough, dyspnea  Patient reports symptoms ongoing over the past week, worried she might of broken a rib with coughing.  Patient reports that her cough is somewhat worsening, but she does cough daily.  Cough is nonproductive.  She is on multiple antibiotics for MAC infection.  Follows with pulmonology.  Uses nebulized breathing treatments daily does not feel as though they improve her symptoms but usually make her cough worse.  Chest pain is right-sided primarily, sharp and stabbing, worse with deep inspiration or movement or direct palpation.  Difficulty taking deep inspiration secondary to chest pain.  No nausea or vomiting, no lightheadedness or syncope.  Past Medical History Past Medical History:  Diagnosis Date   Breast cancer (HCC) 11/12/2012   Dx oct 2013 - [facility] - Invasive ductal carcinoma, s/o bilat mastectomy with + margin  - also for XRT soon   Bronchiectasis    Chronic fatigue fibromyalgia syndrome 11/12/2012   COPD (chronic obstructive pulmonary disease) (HCC)    DVT, lower extremity (HCC)    LLE   Fibromyalgia    GERD (gastroesophageal reflux disease)    History of shingles    Hyperlipidemia    Hypertension    Impaired glucose tolerance 11/14/2013   Migraine    Osteopenia    Osteoporosis 10/16/2007   Qualifier: Diagnosis of  By: Norleen MD, Lynwood ORN    Pulmonary Mycobacterium avium complex (MAC) infection (HCC)    Squamous cell skin cancer, multiple sites    Vaccine counseling 01/18/2022   Patient Active Problem List   Diagnosis Date Noted   Rib pain on right side  04/02/2024   Acute cough 03/15/2024   Hypercalcemia 01/04/2024   Near syncope 06/19/2023   Bilateral impacted cerumen 04/10/2023   Right hand pain 12/21/2022   Chronic chest pain 12/21/2022   Vaccine counseling 01/18/2022   Aortic atherosclerosis 12/16/2021   Tinnitus of left ear 08/03/2020   Pulmonary nodules 09/11/2019   Atrial tachycardia 09/04/2019   Cold intolerance 11/20/2018   Chronic eczematous otitis externa of both ears 03/13/2018   Dizziness 01/04/2018   Palpitations 01/04/2018   Coronary artery calcification seen on CT scan 12/07/2017   Osteopenia of multiple sites 11/30/2017   Aromatase inhibitor use 11/30/2017   Hypermetropia of both eyes 08/31/2017   PCO (posterior capsular opacification), right 08/31/2017   Dermatochalasis of both upper eyelids 08/31/2017   Posterior vitreous detachment of both eyes 08/31/2017   Pseudophakia of both eyes 08/31/2017   Keratoconjunctivitis sicca of both eyes not specified as Sjogren's 02/26/2017   Chronic low back pain 12/06/2016   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   Encounter for follow-up surveillance of breast cancer 11/24/2015   Encounter for monitoring aromatase inhibitor therapy 11/24/2015   Insomnia 01/28/2015   Abnormal finding on mammography 10/07/2014   Breast CA (HCC) 10/07/2014   Chronic cough 10/07/2014   Abdominal tenderness, epigastric 10/07/2014   Headache, migraine 10/07/2014   Raynaud's syndrome 10/07/2014   Plantar fasciitis 10/07/2014   Abdominal tenderness, RUQ (right upper quadrant) 10/07/2014   Dysuria 10/07/2014   Elevated glucose 10/07/2014   Raynaud's phenomenon 10/07/2014   Sensorineural hearing  loss (SNHL) of both ears 10/07/2014   Voice disturbance 10/07/2014   Breast cyst 10/07/2014   Impaired glucose tolerance 11/14/2013   Breast cancer (HCC) 11/12/2012   Chronic fatigue syndrome with fibromyalgia 11/12/2012   Peripheral edema 02/11/2012   Preventative health care 11/04/2011   Bronchiectasis  without complication (HCC) 01/19/2011   Microscopic hematuria 10/31/2010   Mycobacterium avium-intracellulare infection (HCC) 10/16/2007   Hyperlipidemia 10/16/2007   Migraine without aura 10/16/2007   Essential hypertension 10/16/2007   GERD 10/16/2007   Fibromyalgia 10/16/2007   Osteoporosis 10/16/2007   SHINGLES, HX OF 10/16/2007   Myalgia and myositis, unspecified 10/16/2007   Home Medication(s) Prior to Admission medications  Medication Sig Start Date End Date Taking? Authorizing Provider  acetaminophen  (TYLENOL ) 325 MG tablet Take 2 tablets (650 mg total) by mouth every 6 (six) hours as needed. 12/24/24  Yes Elnor Savant A, DO  ethambutol  (MYAMBUTOL ) 400 MG tablet Take by mouth. 10/29/24  Yes [provider]  ibuprofen  (ADVIL ) 600 MG tablet Take 1 tablet (600 mg total) by mouth every 6 (six) hours as needed. 12/24/24  Yes Elnor Savant A, DO  lidocaine  (LIDODERM ) 5 % Place 1 patch onto the skin daily as needed. Remove & Discard patch within 12 hours or as directed by MD 12/24/24  Yes Elnor Savant A, DO  albuterol  (PROVENTIL ) (2.5 MG/3ML) 0.083% nebulizer solution USE 1 VIAL VIA NEBULIZER EVERY 6 HOURS AS NEEDED FOR WHEEZING OR SHORTNESS OF BREATH 02/19/24   Jude Harden GAILS, MD  albuterol  (PROVENTIL ) (2.5 MG/3ML) 0.083% nebulizer solution Take 3 mLs (2.5 mg total) by nebulization every 6 (six) hours as needed for wheezing or shortness of breath. 02/18/24   Jude Harden GAILS, MD  amLODipine  (NORVASC ) 5 MG tablet TAKE ONE TABLET BY MOUTH ONE TIME DAILY 11/24/24   Norleen Lynwood ORN, MD  aspirin 81 MG EC tablet Take 81 mg by mouth daily.    [provider]  atorvastatin  (LIPITOR) 20 MG tablet TAKE ONE TABLET BY MOUTH ONE TIME DAILY 11/24/24   Norleen Lynwood ORN, MD  azithromycin  (ZITHROMAX ) 500 MG tablet Take by mouth once at three times per week 04/02/24   Norleen Lynwood ORN, MD  calcium  citrate-vitamin D  (CITRACAL+D) 315-200 MG-UNIT per tablet Take 2 tablets by mouth daily.     [provider]  carboxymethylcellulose (REFRESH PLUS) 0.5 % SOLN Place 1 drop into both eyes 2 (two) times daily as needed (dry eyes).    [provider]  carisoprodol  (SOMA ) 350 MG tablet Take 1 tablet (350 mg total) by mouth 2 (two) times daily as needed for muscle spasms. 06/02/24   Norleen Lynwood ORN, MD  denosumab  (PROLIA ) 60 MG/ML SOSY injection Inject 60 mg into the skin every 6 (six) months. 08/01/23   Norleen Lynwood ORN, MD  ethambutol  (MYAMBUTOL ) 100 MG tablet Take 3 tabs by mouth at three times per week 04/02/24   Norleen Lynwood ORN, MD  gabapentin  (NEURONTIN ) 100 MG capsule Take 1 capsule (100 mg total) by mouth 3 (three) times daily. Patient taking differently: Take 300 mg by mouth 2 (two) times daily. 06/04/24   Norleen Lynwood ORN, MD  gabapentin  (NEURONTIN ) 300 MG capsule Take by mouth.    [provider]  metoprolol  tartrate (LOPRESSOR ) 25 MG tablet TAKE ONE-HALF TABLET BY MOUTH TWICE A DAY 11/24/24   Norleen Lynwood ORN, MD  olmesartan -hydrochlorothiazide (BENICAR  HCT) 20-12.5 MG tablet TAKE ONE-HALF TABLET BY MOUTH ONE TIME DAILY 11/24/24   Norleen Lynwood ORN, MD  promethazine -dextromethorphan (  PROMETHAZINE -DM) 6.25-15 MG/5ML syrup Take 5 mLs by mouth 4 (four) times daily as needed. 03/13/24   Norleen Lynwood ORN, MD  Respiratory Therapy Supplies (FLUTTER) DEVI Use as directed. 07/29/15   Parrett, Madelin RAMAN, NP  rifampin  (RIFADIN ) 300 MG capsule Take 2 tabs by mouth three times per week 04/02/24   Norleen Lynwood ORN, MD  Sodium Chloride , Inhalant, 7 % NEBU Inhale 3 mLs into the lungs 2 (two) times daily. 07/14/22   [provider]  temazepam  (RESTORIL ) 15 MG capsule TAKE ONE CAPSULE BY MOUTH AT BEDTIME 11/24/24   Norleen Lynwood ORN, MD                                                                                                                                    Past Surgical History Past Surgical History:  Procedure Laterality Date   BRONCHIAL WASHINGS  10/17/2022   Procedure: BRONCHIAL WASHINGS;  Surgeon:  Jude Harden GAILS, MD;  Location: [facility];  Service: Cardiopulmonary;;   FOOT SURGERY     mastectomy bilateral oct 2013     shoulder impingement     VIDEO BRONCHOSCOPY N/A 05/07/2014   Procedure: VIDEO BRONCHOSCOPY WITHOUT FLUORO;  Surgeon: Belvie FORBES Silvan, MD;  Location: [facility];  Service: Cardiopulmonary;  Laterality: N/A;   VIDEO BRONCHOSCOPY N/A 10/17/2022   Procedure: VIDEO BRONCHOSCOPY WITHOUT FLUORO;  Surgeon: Jude Harden GAILS, MD;  Location: [facility];  Service: Cardiopulmonary;  Laterality: N/A;   Family History Family History  Problem Relation Age of Onset   Lung cancer Father    Colon polyps Father    Heart disease Other        grandparents   Breast cancer Other    Hyperlipidemia Other    Hypertension Other    Other Other        alcholism / addiction    Social History Social History[1] Allergies Meloxicam, Losartan , Adhesive [tape], Ciprofloxacin, Clarithromycin, Prednisone, and Tramadol   Review of Systems A thorough review of systems was obtained and all systems are negative except as noted in the HPI and PMH.   Physical Exam Vital Signs  I have reviewed the triage vital signs BP 124/62 (BP Location: Right Arm)   Pulse 64   Temp 99 F (37.2 C) (Oral)   Resp (!) 21   SpO2 96%  Physical Exam Vitals and nursing note reviewed.  Constitutional:      General: She is not in acute distress.    Appearance: Normal appearance.  HENT:     Head: Normocephalic and atraumatic.     Right Ear: External ear normal.     Left Ear: External ear normal.     Nose: Nose normal.     Mouth/Throat:     Mouth: Mucous membranes are moist.  Eyes:     General: No scleral icterus.       Right eye: No discharge.  Left eye: No discharge.  Cardiovascular:     Rate and Rhythm: Normal rate and regular rhythm.     Pulses: Normal pulses.     Heart sounds: Normal heart sounds.  Pulmonary:     Effort: Pulmonary effort is normal. No accessory muscle usage or respiratory  distress.     Breath sounds: No stridor.     Comments: Coarse bilateral Chest:    Abdominal:     General: Abdomen is flat. There is no distension.     Palpations: Abdomen is soft.     Tenderness: There is no abdominal tenderness.  Musculoskeletal:     Cervical back: No rigidity.     Right lower leg: No edema.     Left lower leg: No edema.  Skin:    General: Skin is warm and dry.     Capillary Refill: Capillary refill takes less than 2 seconds.  Neurological:     Mental Status: She is alert.  Psychiatric:        Mood and Affect: Mood normal.        Behavior: Behavior normal. Behavior is cooperative.     ED Results and Treatments Labs (all labs ordered are listed, but only abnormal results are displayed) Labs Reviewed  CBC WITH DIFFERENTIAL/PLATELET - Abnormal; Notable for the following components:      Result Value   Hemoglobin 15.1 (*)    All other components within normal limits  RESP PANEL BY RT-PCR (RSV, FLU A&B, COVID)  RVPGX2  COMPREHENSIVE METABOLIC PANEL WITH GFR  PRO BRAIN NATRIURETIC PEPTIDE  TROPONIN T, HIGH SENSITIVITY  TROPONIN T, HIGH SENSITIVITY                                                                                                                          Radiology CT Angio Chest PE W and/or Wo Contrast Result Date: 12/24/2024 CLINICAL DATA:  Right-sided chest pain one week with coughing and pleuritic pain. Evaluate for pulmonary embolism. EXAM: CT ANGIOGRAPHY CHEST WITH CONTRAST TECHNIQUE: Multidetector CT imaging of the chest was performed using the standard protocol during bolus administration of intravenous contrast. Multiplanar CT image reconstructions and MIPs were obtained to evaluate the vascular anatomy. RADIATION DOSE REDUCTION: This exam was performed according to the departmental dose-optimization program which includes automated exposure control, adjustment of the mA and/or kV according to patient size and/or use of iterative  reconstruction technique. CONTRAST:  75mL OMNIPAQUE  IOHEXOL  350 MG/ML SOLN COMPARISON:  07/14/2024, 02/15/2021 FINDINGS: Cardiovascular: Heart is normal size. Minimal calcified plaque over the left anterior descending coronary artery. Thoracic aorta is normal in caliber. Mild calcified plaque throughout the descending thoracic aorta. Bovine arch morphology. Pulmonary arterial system is well opacified without evidence of mobile emboli. Remaining vascular structures are unremarkable. Mediastinum/Nodes: No evidence of mediastinal or hilar adenopathy. Remaining mediastinal structures are unremarkable. Lungs/Pleura: Lungs are adequately inflated. No evidence of acute airspace consolidation or effusion. There are patchy bilateral peripheral areas of reticulonodular/tree-in-bud opacification without significant  change. There also patchy areas of peripheral bilateral consolidation with bronchiectatic change. Patchy areas of mucous plugging. These findings not significant changed and are likely due to chronic atypical infectious/inflammatory process such as MAC. Upper Abdomen: Images through the upper abdomen demonstrate calcified plaque over the abdominal aorta. Subcentimeter hypodensity over the right lobe of the liver unchanged and too small to characterize but likely a cyst. No acute findings. Musculoskeletal: Old right lateral rib fractures. No acute findings. Review of the MIP images confirms the above findings. IMPRESSION: 1. No evidence of pulmonary embolism. 2. Stable patchy bilateral peripheral areas of reticulonodular/tree-in-bud opacification with patchy areas of peripheral consolidation with bronchiectatic change and mucous plugging. Findings are likely due to chronic atypical infectious/inflammatory process such as MAC. 3. Aortic atherosclerosis. Atherosclerotic coronary artery disease. Aortic Atherosclerosis (ICD10-I70.0). Electronically Signed   By: Toribio Agreste M.D.   On: 12/24/2024 10:44   DG Chest Port  1 View Result Date: 12/24/2024 CLINICAL DATA:  Chest pain EXAM: PORTABLE CHEST 1 VIEW COMPARISON:  March 13, 2024 FINDINGS: The heart size and mediastinal contours are within normal limits. Hyperinflation of the lungs is noted. Left lung is clear. Stable probable right upper lobe and right basilar scarring is noted. The visualized skeletal structures are unremarkable. IMPRESSION: Hyperinflation of the lungs. Stable probable right upper lobe and right basilar scarring. No definite acute abnormality seen. Electronically Signed   By: Lynwood Landy Raddle M.D.   On: 12/24/2024 09:15    Pertinent labs & imaging results that were available during my care of the patient were reviewed by me and considered in my medical decision making (see MDM for details).  Medications Ordered in ED Medications  ibuprofen  (ADVIL ) tablet 600 mg (600 mg Oral Given 12/24/24 0858)  acetaminophen  (TYLENOL ) tablet 1,000 mg (1,000 mg Oral Given 12/24/24 0857)  iohexol  (OMNIPAQUE ) 350 MG/ML injection 100 mL (75 mLs Intravenous Contrast Given 12/24/24 0941)                                                                                                                                     Procedures Procedures  (including critical care time)  Medical Decision Making / ED Course    Medical Decision Making:    Diane Haney is a 79 y.o. female with past medical history as below, significant for breast cancer status postmastectomy bilateral, COPD, DVT, fibromyalgia, MAC infection who presents to the ED with complaint of right-sided chest pain, cough, dyspnea. The complaint involves an extensive differential diagnosis and also carries with it a high risk of complications and morbidity.  Serious etiology was considered. Ddx includes but is not limited to: Differential includes all life-threatening causes for chest pain. This includes but is not exclusive to acute coronary syndrome, aortic dissection, pulmonary embolism, cardiac tamponade,  community-acquired pneumonia, pericarditis, musculoskeletal chest wall pain, etc.   Complete initial physical exam performed, notably the patient was in no acute distress.  Reviewed and confirmed nursing documentation for past medical history, family history, social history.  Vital signs reviewed.    Chest pain> - Right-sided chest pain over the past week, worse with coughing, deep inspiration or palpation/movement. - History of COPD, MAC infection (recurrent), breast cancer - Labs stable - Imaging stable - Offered analgesia, patient prefers to take Tylenol /Motrin  - Labs and imaging are stable.  Troponin negative x 2.  CT PE was stable.  Shows improving likely MAC infection.  She has reproducible chest wall pain.  Her pain seems most likely atypical or musculoskeletal from her chronic cough.  Will give pain medication.  She does not want a narcotic pain medication, she is already taking muscle relaxer does not want a different one.  She already uses flutter valve.  She already uses nebulizer regularly at home.  Encouraged her to use honey twice daily to help with her cough.  Follow-up with PCP/pulm - atypical chest pain seems most likely at this time, acs unlikely given trop neg x2 and stable EKG. Reproducible cp.  - She has been using a flutter valve at home.  Encouraged her to continue this - Encourage close follow-up with PCP - pt is   Clinical Course as of 12/24/24 1404  Wed Dec 24, 2024  1106 CT stable [SG]  1343 Feeling better [SG]    Clinical Course User Index [SG] Elnor Jayson LABOR, DO    2:04 PM:  I have discussed the diagnosis/risks/treatment options with the patient and family.  Evaluation and diagnostic testing in the emergency department does not suggest an emergent condition requiring admission or immediate intervention beyond what has been performed at this time.  They will follow up with pcp. We also discussed returning to the ED immediately if new or worsening sx occur. We  discussed the sx which are most concerning (e.g., sudden worsening pain, fever, inability to tolerate by mouth) that necessitate immediate return.    The patient appears reasonably screened and/or stabilized for discharge and I doubt any other medical condition or other [facility] requiring further screening, evaluation, or treatment in the ED at this time prior to discharge.                   Additional history obtained: -Additional history obtained from spouse -External records from outside source obtained and reviewed including: Chart review including previous notes, labs, imaging, consultation notes including  Prior pulmonology documentation; sees Dr. Jude   Lab Tests: -I ordered, reviewed, and interpreted labs.   The pertinent results include:   Labs Reviewed  CBC WITH DIFFERENTIAL/PLATELET - Abnormal; Notable for the following components:      Result Value   Hemoglobin 15.1 (*)    All other components within normal limits  RESP PANEL BY RT-PCR (RSV, FLU A&B, COVID)  RVPGX2  COMPREHENSIVE METABOLIC PANEL WITH GFR  PRO BRAIN NATRIURETIC PEPTIDE  TROPONIN T, HIGH SENSITIVITY  TROPONIN T, HIGH SENSITIVITY    Notable for labs stable  EKG   EKG Interpretation Date/Time:  Wednesday December 24 2024 08:17:59 EST Ventricular Rate:  71 PR Interval:  169 QRS Duration:  84 QT Interval:  408 QTC Calculation: 444 R Axis:   56  Text Interpretation: Sinus rhythm RAE, consider biatrial enlargement Borderline T abnormalities, lateral leads Confirmed by Elnor Jayson ([id number]) on 12/24/2024 8:33:21 AM         Imaging Studies ordered: I ordered imaging studies including CXR/ CTPE I independently visualized the following imaging with scope of interpretation limited  to determining acute life threatening conditions related to emergency care; findings noted above I agree with the radiologist interpretation If any imaging was obtained with contrast I closely monitored patient for any  possible adverse reaction a/w contrast administration in the emergency department   Medicines ordered and prescription drug management: Meds ordered this encounter  Medications   ibuprofen  (ADVIL ) tablet 600 mg   acetaminophen  (TYLENOL ) tablet 1,000 mg   iohexol  (OMNIPAQUE ) 350 MG/ML injection 100 mL   lidocaine  (LIDODERM ) 5 %    Sig: Place 1 patch onto the skin daily as needed. Remove & Discard patch within 12 hours or as directed by MD    Dispense:  15 patch    Refill:  0   ibuprofen  (ADVIL ) 600 MG tablet    Sig: Take 1 tablet (600 mg total) by mouth every 6 (six) hours as needed.    Dispense:  30 tablet    Refill:  0   acetaminophen  (TYLENOL ) 325 MG tablet    Sig: Take 2 tablets (650 mg total) by mouth every 6 (six) hours as needed.    Dispense:  36 tablet    Refill:  0    -I have reviewed the patients home medicines and have made adjustments as needed   Consultations Obtained: na   Cardiac Monitoring: The patient was maintained on a cardiac monitor.  I personally viewed and interpreted the cardiac monitored which showed an underlying rhythm of: nsr Continuous pulse oximetry interpreted by myself, 98% on RA.    Social Determinants of Health:  Diagnosis or treatment significantly limited by social determinants of health: former smoker   Reevaluation: After the interventions noted above, I reevaluated the patient and found that they have improved  Co morbidities that complicate the patient evaluation  Past Medical History:  Diagnosis Date   Breast cancer (HCC) 11/12/2012   Dx oct 2013 - [facility] - Invasive ductal carcinoma, s/o bilat mastectomy with + margin  - also for XRT soon   Bronchiectasis    Chronic fatigue fibromyalgia syndrome 11/12/2012   COPD (chronic obstructive pulmonary disease) (HCC)    DVT, lower extremity (HCC)    LLE   Fibromyalgia    GERD (gastroesophageal reflux disease)    History of shingles    Hyperlipidemia     Hypertension    Impaired glucose tolerance 11/14/2013   Migraine    Osteopenia    Osteoporosis 10/16/2007   Qualifier: Diagnosis of  By: Norleen MD, Lynwood ORN    Pulmonary Mycobacterium avium complex (MAC) infection (HCC)    Squamous cell skin cancer, multiple sites    Vaccine counseling 01/18/2022      Dispostion: Disposition decision including need for hospitalization was considered, and patient discharged from emergency department.    Final Clinical Impression(s) / ED Diagnoses Final diagnoses:  Chest wall pain  Chronic cough         [1]  Social History Tobacco Use   Smoking status: Former    Current packs/day: 0.00    Average packs/day: 1 pack/day for 25.0 years (25.0 ttl pk-yrs)    Types: Cigarettes    Start date: 12/11/1960    Quit date: 12/11/1985    Years since quitting: 39.0   Smokeless tobacco: Never   Tobacco comments:    pt does not smoke  Vaping Use   Vaping status: Never Used  Substance Use Topics   Alcohol use: No    Comment: occasional   Drug use: No  Elnor Jayson LABOR, DO 12/24/24 1404  "

## 2024-12-24 NOTE — ED Triage Notes (Signed)
 Pt c.o of right sided chest pain that began last week. Began to hurt worse last night. Also shares that it is difficult to get a full breath.   States she thinks the pain is from coughing.

## 2024-12-29 ENCOUNTER — Encounter (HOSPITAL_BASED_OUTPATIENT_CLINIC_OR_DEPARTMENT_OTHER): Payer: Self-pay | Admitting: Pulmonary Disease

## 2024-12-29 NOTE — Telephone Encounter (Signed)
 Please advise.

## 2024-12-30 ENCOUNTER — Encounter (HOSPITAL_BASED_OUTPATIENT_CLINIC_OR_DEPARTMENT_OTHER): Payer: Self-pay | Admitting: Pulmonary Disease

## 2024-12-30 ENCOUNTER — Other Ambulatory Visit (HOSPITAL_BASED_OUTPATIENT_CLINIC_OR_DEPARTMENT_OTHER): Payer: Self-pay

## 2024-12-30 MED ORDER — RIFAMPIN 300 MG PO CAPS: ORAL_CAPSULE | ORAL | 2 refills | Status: AC

## 2024-12-30 MED ORDER — ETHAMBUTOL HCL 400 MG PO TABS: ORAL_TABLET | ORAL | 2 refills | Status: DC

## 2024-12-30 MED ORDER — ETHAMBUTOL HCL 400 MG PO TABS: ORAL_TABLET | ORAL | 2 refills | Status: AC

## 2024-12-30 MED ORDER — AZITHROMYCIN 500 MG PO TABS: ORAL_TABLET | ORAL | 2 refills | Status: AC

## 2024-12-30 NOTE — Telephone Encounter (Signed)
 Rxs sent to pharmacy doses confirmed with pt and pt notified

## 2024-12-31 ENCOUNTER — Ambulatory Visit (INDEPENDENT_AMBULATORY_CARE_PROVIDER_SITE_OTHER): Payer: Medicare Other | Admitting: Internal Medicine

## 2024-12-31 ENCOUNTER — Encounter: Payer: Self-pay | Admitting: Internal Medicine

## 2024-12-31 ENCOUNTER — Ambulatory Visit: Payer: Self-pay | Admitting: Internal Medicine

## 2024-12-31 VITALS — BP 122/78 | HR 70 | Temp 98.6°F | Ht 65.5 in | Wt 113.0 lb

## 2024-12-31 DIAGNOSIS — E7849 Other hyperlipidemia: Secondary | ICD-10-CM | POA: Diagnosis not present

## 2024-12-31 DIAGNOSIS — R7309 Other abnormal glucose: Secondary | ICD-10-CM

## 2024-12-31 DIAGNOSIS — I10 Essential (primary) hypertension: Secondary | ICD-10-CM | POA: Diagnosis not present

## 2024-12-31 LAB — BASIC METABOLIC PANEL WITH GFR
BUN: 15 mg/dL (ref 6–23)
CO2: 32 meq/L (ref 19–32)
Calcium: 10.3 mg/dL (ref 8.4–10.5)
Chloride: 101 meq/L (ref 96–112)
Creatinine, Ser: 0.94 mg/dL (ref 0.40–1.20)
GFR: 57.81 mL/min — ABNORMAL LOW
Glucose, Bld: 94 mg/dL (ref 70–99)
Potassium: 4 meq/L (ref 3.5–5.1)
Sodium: 140 meq/L (ref 135–145)

## 2024-12-31 LAB — CBC WITH DIFFERENTIAL/PLATELET
Basophils Absolute: 0 K/uL (ref 0.0–0.1)
Basophils Relative: 0.7 % (ref 0.0–3.0)
Eosinophils Absolute: 0.1 K/uL (ref 0.0–0.7)
Eosinophils Relative: 0.7 % (ref 0.0–5.0)
HCT: 42.9 % (ref 36.0–46.0)
Hemoglobin: 14.7 g/dL (ref 12.0–15.0)
Lymphocytes Relative: 12 % (ref 12.0–46.0)
Lymphs Abs: 0.9 K/uL (ref 0.7–4.0)
MCHC: 34.3 g/dL (ref 30.0–36.0)
MCV: 96.9 fl (ref 78.0–100.0)
Monocytes Absolute: 0.8 K/uL (ref 0.1–1.0)
Monocytes Relative: 11 % (ref 3.0–12.0)
Neutro Abs: 5.6 K/uL (ref 1.4–7.7)
Neutrophils Relative %: 75.6 % (ref 43.0–77.0)
Platelets: 212 K/uL (ref 150.0–400.0)
RBC: 4.42 Mil/uL (ref 3.87–5.11)
RDW: 14.1 % (ref 11.5–15.5)
WBC: 7.4 K/uL (ref 4.0–10.5)

## 2024-12-31 LAB — HEMOGLOBIN A1C: Hgb A1c MFr Bld: 6.2 % (ref 4.6–6.5)

## 2024-12-31 LAB — HEPATIC FUNCTION PANEL
ALT: 15 U/L (ref 3–35)
AST: 25 U/L (ref 5–37)
Albumin: 4.3 g/dL (ref 3.5–5.2)
Alkaline Phosphatase: 34 U/L — ABNORMAL LOW (ref 39–117)
Bilirubin, Direct: 0 mg/dL — ABNORMAL LOW (ref 0.1–0.3)
Total Bilirubin: 0.3 mg/dL (ref 0.2–1.2)
Total Protein: 7.9 g/dL (ref 6.0–8.3)

## 2024-12-31 LAB — LIPID PANEL
Cholesterol: 179 mg/dL (ref 28–200)
HDL: 79.9 mg/dL
LDL Cholesterol: 86 mg/dL (ref 10–99)
NonHDL: 98.68
Total CHOL/HDL Ratio: 2
Triglycerides: 65 mg/dL (ref 10.0–149.0)
VLDL: 13 mg/dL (ref 0.0–40.0)

## 2024-12-31 NOTE — Progress Notes (Unsigned)
 Patient ID: Diane Haney, female   DOB: 04/24/1946, 79 y.o.   MRN: [id number]         Chief Complaint:: yearly exam       HPI:  Diane Haney is a 79 y.o. female here overall doing ok,  Pt denies chest pain, increased sob or doe, wheezing, orthopnea, PND, increased LE swelling, palpitations, dizziness or syncope, though has chronic recurring scant prod cough.   Pt denies polydipsia, polyuria, or new focal neuro s/s.   No new complaints   Wt Readings from Last 3 Encounters:  12/31/24 113 lb (51.3 kg)  08/28/24 112 lb 6.4 oz (51 kg)  07/29/24 113 lb 3.2 oz (51.3 kg)   BP Readings from Last 3 Encounters:  12/31/24 122/78  12/24/24 124/62  08/28/24 128/72   Immunization History  Administered Date(s) Administered   Fluad Quad(high Dose 65+) 08/29/2019, 09/03/2020, 10/03/2022   INFLUENZA, HIGH DOSE SEASONAL PF 08/30/2021, 09/14/2023, 08/28/2024   Influenza,inj,Quad PF,6+ Mos 08/25/2013, 07/29/2014, 09/04/2018   Influenza-Unspecified 08/09/2016, 08/22/2017, 08/29/2019   PFIZER Comirnaty(Gray Top)Covid-19 Tri-Sucrose Vaccine 03/28/2021   PFIZER(Purple Top)SARS-COV-2 Vaccination 01/07/2020, 01/28/2020, 09/10/2020, 03/28/2021, 09/09/2021   PNEUMOCOCCAL CONJUGATE-20 01/18/2022   Pfizer Covid-19 Vaccine Bivalent Booster 72yrs & up 09/09/2021   Pneumococcal Conjugate-13 11/14/2013   Pneumococcal Polysaccharide-23 10/11/2006, 11/12/2012   Pneumococcal-Unspecified 08/04/2015   Respiratory Syncytial Virus Vaccine,Recomb Aduvanted(Arexvy) 10/21/2022   Td 12/12/2003, 09/08/2015   Tdap 09/08/2015   Zoster Recombinant(Shingrix) 12/16/2020, 02/28/2021   Zoster, Live 11/10/2014   Health Maintenance Due  Topic Date Due   Medicare Annual Wellness (AWV)  05/01/2018      Past Medical History:  Diagnosis Date   Breast cancer (HCC) 11/12/2012   Dx oct 2013 - [facility] - Invasive ductal carcinoma, s/o bilat mastectomy with + margin  - also for XRT soon   Bronchiectasis    Chronic  fatigue fibromyalgia syndrome 11/12/2012   COPD (chronic obstructive pulmonary disease) (HCC)    DVT, lower extremity (HCC)    LLE   Fibromyalgia    GERD (gastroesophageal reflux disease)    History of shingles    Hyperlipidemia    Hypertension    Impaired glucose tolerance 11/14/2013   Migraine    Osteopenia    Osteoporosis 10/16/2007   Qualifier: Diagnosis of  By: Norleen MD, Lynwood ORN    Pulmonary Mycobacterium avium complex (MAC) infection (HCC)    Squamous cell skin cancer, multiple sites    Vaccine counseling 01/18/2022   Past Surgical History:  Procedure Laterality Date   BRONCHIAL WASHINGS  10/17/2022   Procedure: BRONCHIAL WASHINGS;  Surgeon: Jude Harden GAILS, MD;  Location: [facility];  Service: Cardiopulmonary;;   FOOT SURGERY     mastectomy bilateral oct 2013     shoulder impingement     VIDEO BRONCHOSCOPY N/A 05/07/2014   Procedure: VIDEO BRONCHOSCOPY WITHOUT FLUORO;  Surgeon: Belvie FORBES Silvan, MD;  Location: [facility];  Service: Cardiopulmonary;  Laterality: N/A;   VIDEO BRONCHOSCOPY N/A 10/17/2022   Procedure: VIDEO BRONCHOSCOPY WITHOUT FLUORO;  Surgeon: Jude Harden GAILS, MD;  Location: [facility];  Service: Cardiopulmonary;  Laterality: N/A;    reports that she quit smoking about 39 years ago. Her smoking use included cigarettes. She started smoking about 64 years ago. She has a 25 pack-year smoking history. She has never used smokeless tobacco. She reports that she does not drink alcohol and does not use drugs. family history includes Breast cancer in an other family member; Colon polyps in her  father; Heart disease in an other family member; Hyperlipidemia in an other family member; Hypertension in an other family member; Lung cancer in her father; Other in an other family member. Allergies[1] Medications Ordered Prior to Encounter[2]      ROS:  All others reviewed and negative.  Objective        PE:  BP 122/78 (BP Location: Left Arm, Patient Position: Sitting, Cuff  Size: Normal)   Pulse 70   Temp 98.6 F (37 C) (Oral)   Ht 5' 5.5 (1.664 m)   Wt 113 lb (51.3 kg)   SpO2 94%   BMI 18.52 kg/m                 Constitutional: Pt appears in NAD               HENT: Head: NCAT.                Right Ear: External ear normal.                 Left Ear: External ear normal.                Eyes: . Pupils are equal, round, and reactive to light. Conjunctivae and EOM are normal               Nose: without d/c or deformity               Neck: Neck supple. Gross normal ROM               Cardiovascular: Normal rate and regular rhythm.                 Pulmonary/Chest: Effort normal and breath sounds decreased without wheezing.                Abd:  Soft, NT, ND, + BS, no organomegaly               Neurological: Pt is alert. At baseline orientation, motor grossly intact               Skin: Skin is warm. No rashes, no other new lesions, LE edema - none               Psychiatric: Pt behavior is normal without agitation   Micro: none  Cardiac tracings I have personally interpreted today:  none  Pertinent Radiological findings (summarize): none   Lab Results  Component Value Date   WBC 7.4 12/31/2024   HGB 14.7 12/31/2024   HCT 42.9 12/31/2024   PLT 212.0 12/31/2024   GLUCOSE 94 12/31/2024   CHOL 179 12/31/2024   TRIG 65.0 12/31/2024   HDL 79.90 12/31/2024   LDLDIRECT 124.4 11/14/2013   LDLCALC 86 12/31/2024   ALT 15 12/31/2024   AST 25 12/31/2024   NA 140 12/31/2024   K 4.0 12/31/2024   CL 101 12/31/2024   CREATININE 0.94 12/31/2024   BUN 15 12/31/2024   CO2 32 12/31/2024   TSH 4.92 01/01/2024   INR 0.9 02/15/2021   HGBA1C 6.2 12/31/2024   Assessment/Plan:  Diane Haney is a 79 y.o. White or Caucasian [1] female with  has a past medical history of Breast cancer (HCC) (11/12/2012), Bronchiectasis, Chronic fatigue fibromyalgia syndrome (11/12/2012), COPD (chronic obstructive pulmonary disease) (HCC), DVT, lower extremity (HCC), Fibromyalgia, GERD  (gastroesophageal reflux disease), History of shingles, Hyperlipidemia, Hypertension, Impaired glucose tolerance (11/14/2013), Migraine, Osteopenia, Osteoporosis (10/16/2007), Pulmonary Mycobacterium avium complex (MAC) infection (  HCC), Squamous cell skin cancer, multiple sites, and Vaccine counseling (01/18/2022).  Elevated glucose Lab Results  Component Value Date   HGBA1C 6.2 12/31/2024   Stable, pt to continue current medical treatment  - diet, wt control   Essential hypertension BP Readings from Last 3 Encounters:  12/31/24 122/78  12/24/24 124/62  08/28/24 128/72   Stable, pt to continue medical treatment norvasc  5 mg every day, lopressor  12.5 bid, benicar  hct 20 12.5 mg qd   Hyperlipidemia Lab Results  Component Value Date   LDLCALC 86 12/31/2024   Stable, pt to continue low chol diet  Followup: Return in about 6 months (around 06/30/2025) for follow up with Nurse Practitioner.  Lynwood Rush, MD 01/01/2025 5:37 AM [facility]     [1]  Allergies Allergen Reactions   Meloxicam Anaphylaxis    Increased blood pressure    Losartan      headache   Adhesive [Tape]     Blisters   Ciprofloxacin     REACTION: tendinitis   Clarithromycin     REACTION: severe reflux   Prednisone     REACTION: irregular heartbeat, not able to sleep   Tramadol  Other (See Comments)  [2]  Current Outpatient Medications on File Prior to Visit  Medication Sig Dispense Refill   acetaminophen  (TYLENOL ) 325 MG tablet Take 2 tablets (650 mg total) by mouth every 6 (six) hours as needed. 36 tablet 0   albuterol  (PROVENTIL ) (2.5 MG/3ML) 0.083% nebulizer solution USE 1 VIAL VIA NEBULIZER EVERY 6 HOURS AS NEEDED FOR WHEEZING OR SHORTNESS OF BREATH 360 mL 11   albuterol  (PROVENTIL ) (2.5 MG/3ML) 0.083% nebulizer solution Take 3 mLs (2.5 mg total) by nebulization every 6 (six) hours as needed for wheezing or shortness of breath. 360 mL 11    amLODipine  (NORVASC ) 5 MG tablet TAKE ONE TABLET BY MOUTH ONE TIME DAILY 90 tablet 3   aspirin 81 MG EC tablet Take 81 mg by mouth daily.     atorvastatin  (LIPITOR) 20 MG tablet TAKE ONE TABLET BY MOUTH ONE TIME DAILY 90 tablet 3   azithromycin  (ZITHROMAX ) 500 MG tablet Take by mouth once at three times per week 12 tablet 2   calcium  citrate-vitamin D  (CITRACAL+D) 315-200 MG-UNIT per tablet Take 2 tablets by mouth daily.      carboxymethylcellulose (REFRESH PLUS) 0.5 % SOLN Place 1 drop into both eyes 2 (two) times daily as needed (dry eyes).     carisoprodol  (SOMA ) 350 MG tablet Take 1 tablet (350 mg total) by mouth 2 (two) times daily as needed for muscle spasms. 180 tablet 1   denosumab  (PROLIA ) 60 MG/ML SOSY injection Inject 60 mg into the skin every 6 (six) months. 3 mL 11   ethambutol  (MYAMBUTOL ) 400 MG tablet Three tablets three times a week 36 tablet 2   gabapentin  (NEURONTIN ) 100 MG capsule Take 1 capsule (100 mg total) by mouth 3 (three) times daily. (Patient taking differently: Take 300 mg by mouth 2 (two) times daily.) 270 capsule 1   gabapentin  (NEURONTIN ) 300 MG capsule Take by mouth.     ibuprofen  (ADVIL ) 600 MG tablet Take 1 tablet (600 mg total) by mouth every 6 (six) hours as needed. 30 tablet 0   lidocaine  (LIDODERM ) 5 % Place 1 patch onto the skin daily as needed. Remove & Discard patch within 12 hours or as directed by MD 15 patch 0   metoprolol  tartrate (LOPRESSOR ) 25 MG tablet TAKE  ONE-HALF TABLET BY MOUTH TWICE A DAY 90 tablet 3   olmesartan -hydrochlorothiazide (BENICAR  HCT) 20-12.5 MG tablet TAKE ONE-HALF TABLET BY MOUTH ONE TIME DAILY 45 tablet 3   promethazine -dextromethorphan (PROMETHAZINE -DM) 6.25-15 MG/5ML syrup Take 5 mLs by mouth 4 (four) times daily as needed. 473 mL 0   Respiratory Therapy Supplies (FLUTTER) DEVI Use as directed. 1 each 0   rifampin  (RIFADIN ) 300 MG capsule Take 2 tabs by mouth three times per week 24 capsule 2   Sodium Chloride , Inhalant, 7 % NEBU  Inhale 3 mLs into the lungs 2 (two) times daily.     temazepam  (RESTORIL ) 15 MG capsule TAKE ONE CAPSULE BY MOUTH AT BEDTIME 90 capsule 1   Current Facility-Administered Medications on File Prior to Visit  Medication Dose Route Frequency Provider Last Rate Last Admin   denosumab  (PROLIA ) injection 60 mg  60 mg Subcutaneous Once Norleen Lynwood ORN, MD       [START ON 04/06/2025] denosumab  (PROLIA ) injection 60 mg  60 mg Subcutaneous Q6 months Norleen Lynwood ORN, MD

## 2024-12-31 NOTE — Progress Notes (Signed)
 The test results show that your current treatment is OK, as the tests are stable.  Please continue the same plan.  There is no other need for change of treatment or further evaluation based on these results, at this time.  thanks

## 2024-12-31 NOTE — Patient Instructions (Signed)
 Please continue all other medications as before,   Please have the pharmacy call with any other refills you may need.  Please continue your efforts at being more active, low cholesterol diet, and weight control.  You are otherwise up to date with prevention measures today.  Please keep your appointments with your specialists as you may have planned  Please go to the LAB at the blood drawing area for the tests to be done  You will be contacted by phone if any changes need to be made immediately.  Otherwise, you will receive a letter about your results with an explanation, but please check with MyChart first.  Please make an Appointment to return in 6 months, or sooner if needed, to Nurse Practitioner

## 2025-01-01 ENCOUNTER — Encounter: Payer: Self-pay | Admitting: Internal Medicine

## 2025-01-01 NOTE — Assessment & Plan Note (Signed)
 Lab Results  Component Value Date   HGBA1C 6.2 12/31/2024   Stable, pt to continue current medical treatment  - diet, wt control

## 2025-01-01 NOTE — Assessment & Plan Note (Signed)
 BP Readings from Last 3 Encounters:  12/31/24 122/78  12/24/24 124/62  08/28/24 128/72   Stable, pt to continue medical treatment norvasc  5 mg every day, lopressor  12.5 bid, benicar  hct 20 12.5 mg qd

## 2025-01-01 NOTE — Assessment & Plan Note (Signed)
 Lab Results  Component Value Date   LDLCALC 86 12/31/2024   Stable, pt to continue low chol diet

## 2025-02-09 ENCOUNTER — Ambulatory Visit (HOSPITAL_BASED_OUTPATIENT_CLINIC_OR_DEPARTMENT_OTHER): Admitting: Pulmonary Disease

## 2025-06-18 ENCOUNTER — Ambulatory Visit: Admitting: Nurse Practitioner
